# Patient Record
Sex: Female | Born: 1980 | Hispanic: No | Marital: Single | Smoking: Never smoker
Health system: Southern US, Community
[De-identification: ages and names within clinical notes are randomized; demographics above are authoritative.]

## PROBLEM LIST (undated history)

## (undated) DIAGNOSIS — F32A Depression, unspecified: Secondary | ICD-10-CM

## (undated) DIAGNOSIS — R519 Headache, unspecified: Secondary | ICD-10-CM

## (undated) DIAGNOSIS — F419 Anxiety disorder, unspecified: Secondary | ICD-10-CM

## (undated) DIAGNOSIS — Z8049 Family history of malignant neoplasm of other genital organs: Secondary | ICD-10-CM

## (undated) DIAGNOSIS — R51 Headache: Secondary | ICD-10-CM

## (undated) DIAGNOSIS — Z803 Family history of malignant neoplasm of breast: Secondary | ICD-10-CM

## (undated) HISTORY — DX: Family history of malignant neoplasm of breast: Z80.3

## (undated) HISTORY — PX: NO PAST SURGERIES: SHX2092

## (undated) HISTORY — DX: Family history of malignant neoplasm of other genital organs: Z80.49

---

## 2003-03-19 ENCOUNTER — Inpatient Hospital Stay (HOSPITAL_COMMUNITY): Admission: AD | Admit: 2003-03-19 | Discharge: 2003-03-19 | Payer: Self-pay | Admitting: Obstetrics and Gynecology

## 2003-03-23 ENCOUNTER — Inpatient Hospital Stay (HOSPITAL_COMMUNITY): Admission: AD | Admit: 2003-03-23 | Discharge: 2003-03-23 | Payer: Self-pay | Admitting: Obstetrics and Gynecology

## 2003-05-19 ENCOUNTER — Inpatient Hospital Stay (HOSPITAL_COMMUNITY): Admission: AD | Admit: 2003-05-19 | Discharge: 2003-05-21 | Payer: Self-pay | Admitting: Obstetrics and Gynecology

## 2004-10-19 ENCOUNTER — Ambulatory Visit: Payer: Self-pay | Admitting: Nurse Practitioner

## 2004-10-26 ENCOUNTER — Ambulatory Visit: Payer: Self-pay | Admitting: *Deleted

## 2004-11-12 ENCOUNTER — Ambulatory Visit: Payer: Self-pay | Admitting: Nurse Practitioner

## 2004-12-15 ENCOUNTER — Ambulatory Visit: Payer: Self-pay | Admitting: Nurse Practitioner

## 2006-12-06 ENCOUNTER — Encounter (INDEPENDENT_AMBULATORY_CARE_PROVIDER_SITE_OTHER): Payer: Self-pay | Admitting: *Deleted

## 2013-06-03 ENCOUNTER — Encounter (HOSPITAL_COMMUNITY): Payer: Self-pay | Admitting: Emergency Medicine

## 2013-06-03 DIAGNOSIS — Z792 Long term (current) use of antibiotics: Secondary | ICD-10-CM | POA: Insufficient documentation

## 2013-06-03 DIAGNOSIS — Z3202 Encounter for pregnancy test, result negative: Secondary | ICD-10-CM | POA: Insufficient documentation

## 2013-06-03 DIAGNOSIS — Z791 Long term (current) use of non-steroidal anti-inflammatories (NSAID): Secondary | ICD-10-CM | POA: Insufficient documentation

## 2013-06-03 DIAGNOSIS — R1012 Left upper quadrant pain: Secondary | ICD-10-CM | POA: Insufficient documentation

## 2013-06-03 LAB — CBC WITH DIFFERENTIAL/PLATELET
BASOS ABS: 0 10*3/uL (ref 0.0–0.1)
Basophils Relative: 0 % (ref 0–1)
EOS PCT: 1 % (ref 0–5)
Eosinophils Absolute: 0.1 10*3/uL (ref 0.0–0.7)
HEMATOCRIT: 35.8 % — AB (ref 36.0–46.0)
Hemoglobin: 12.3 g/dL (ref 12.0–15.0)
LYMPHS ABS: 2.6 10*3/uL (ref 0.7–4.0)
LYMPHS PCT: 28 % (ref 12–46)
MCH: 27.8 pg (ref 26.0–34.0)
MCHC: 34.4 g/dL (ref 30.0–36.0)
MCV: 81 fL (ref 78.0–100.0)
MONO ABS: 0.7 10*3/uL (ref 0.1–1.0)
Monocytes Relative: 7 % (ref 3–12)
NEUTROS ABS: 6.1 10*3/uL (ref 1.7–7.7)
Neutrophils Relative %: 64 % (ref 43–77)
Platelets: 341 10*3/uL (ref 150–400)
RBC: 4.42 MIL/uL (ref 3.87–5.11)
RDW: 12.9 % (ref 11.5–15.5)
WBC: 9.5 10*3/uL (ref 4.0–10.5)

## 2013-06-03 LAB — URINALYSIS, ROUTINE W REFLEX MICROSCOPIC
Bilirubin Urine: NEGATIVE
GLUCOSE, UA: NEGATIVE mg/dL
Hgb urine dipstick: NEGATIVE
KETONES UR: NEGATIVE mg/dL
Nitrite: NEGATIVE
PROTEIN: NEGATIVE mg/dL
Specific Gravity, Urine: 1.017 (ref 1.005–1.030)
UROBILINOGEN UA: 0.2 mg/dL (ref 0.0–1.0)
pH: 6 (ref 5.0–8.0)

## 2013-06-03 LAB — COMPREHENSIVE METABOLIC PANEL
ALT: 13 U/L (ref 0–35)
AST: 21 U/L (ref 0–37)
Albumin: 4.4 g/dL (ref 3.5–5.2)
Alkaline Phosphatase: 61 U/L (ref 39–117)
BUN: 9 mg/dL (ref 6–23)
CALCIUM: 9.4 mg/dL (ref 8.4–10.5)
CHLORIDE: 99 meq/L (ref 96–112)
CO2: 23 meq/L (ref 19–32)
Creatinine, Ser: 0.56 mg/dL (ref 0.50–1.10)
GLUCOSE: 134 mg/dL — AB (ref 70–99)
Potassium: 4.1 mEq/L (ref 3.7–5.3)
Sodium: 138 mEq/L (ref 137–147)
Total Protein: 7.6 g/dL (ref 6.0–8.3)

## 2013-06-03 LAB — PREGNANCY, URINE: Preg Test, Ur: NEGATIVE

## 2013-06-03 LAB — LIPASE, BLOOD: Lipase: 32 U/L (ref 11–59)

## 2013-06-03 LAB — URINE MICROSCOPIC-ADD ON

## 2013-06-03 NOTE — ED Notes (Signed)
Pt. reports chronic / intermittent LUQ pain for several months described as burning /itching , denies SOB . No nausea or vomitting . No fever or chills.

## 2013-06-04 ENCOUNTER — Emergency Department (HOSPITAL_COMMUNITY): Payer: Self-pay

## 2013-06-04 ENCOUNTER — Emergency Department (HOSPITAL_COMMUNITY)
Admission: EM | Admit: 2013-06-04 | Discharge: 2013-06-04 | Disposition: A | Discharge status: Home / Self Care | Payer: Self-pay | Attending: Emergency Medicine | Admitting: Emergency Medicine

## 2013-06-04 ENCOUNTER — Encounter (HOSPITAL_COMMUNITY): Payer: Self-pay | Admitting: Radiology

## 2013-06-04 DIAGNOSIS — R109 Unspecified abdominal pain: Secondary | ICD-10-CM

## 2013-06-04 MED ORDER — IOHEXOL 300 MG/ML  SOLN
20.0000 mL | INTRAMUSCULAR | Status: DC
  Administered 2013-06-04: 20 mL via ORAL

## 2013-06-04 MED ORDER — SODIUM CHLORIDE 0.9 % IV BOLUS (SEPSIS)
1000.0000 mL | Freq: Once | INTRAVENOUS | Status: AC
  Administered 2013-06-04: 1000 mL via INTRAVENOUS

## 2013-06-04 MED ORDER — IOHEXOL 300 MG/ML  SOLN
100.0000 mL | Freq: Once | INTRAMUSCULAR | Status: AC | PRN
  Administered 2013-06-04: 100 mL via INTRAVENOUS

## 2013-06-04 NOTE — ED Notes (Signed)
[id number] is ID # for spanish interpeter.

## 2013-06-04 NOTE — ED Notes (Signed)
Patient transported to CT 

## 2013-06-04 NOTE — ED Notes (Signed)
Patient reports that she has been dealing with this abdominal discomfort for over a year, and medications have not been effective. She reports it does not hurt, but it "feels like something warm is in there".

## 2013-06-04 NOTE — ED Notes (Signed)
Introduced self to patient, she does not speak [place].  Niece is at the bedside.  Will obtain interpreter for assessment.

## 2013-06-04 NOTE — ED Notes (Signed)
Contacted CT, patient has finished drinking contrast and has IV access. Explained to patient the plan of care.

## 2013-06-04 NOTE — ED Provider Notes (Signed)
CSN: [id number]     Arrival date & time 06/03/13  1913 History   First MD Initiated Contact with Patient 06/04/13 0235     Chief Complaint  Patient presents with  . Abdominal Pain     (Consider location/radiation/quality/duration/timing/severity/associated sxs/prior Treatment) HPI Comments: Patient is a 33 year old female who presents with complaints of pain and burning in the left upper quadrant for the past year. She has seen several doctors for this however does not feel as though she's been given an adequate explanation. She denies fevers or chills. She denies any bowel or bladder complaints.  Patient does not speak [place], only [place]. History was taken with the assistance of her friend who was present at bedside.  Patient is a 33 y.o. female presenting with abdominal pain. The history is provided by the patient.  Abdominal Pain Pain location:  LUQ Pain quality: burning   Pain radiates to:  Does not radiate Pain severity:  Moderate Onset quality:  Gradual Duration: One year. Timing:  Constant Progression:  Worsening Chronicity:  New Relieved by:  Nothing Worsened by:  Nothing tried   History reviewed. No pertinent past medical history. History reviewed. No pertinent past surgical history. No family history on file. History  Substance Use Topics  . Smoking status: Never Smoker   . Smokeless tobacco: Not on file  . Alcohol Use: No   OB History   Grav Para Term Preterm Abortions TAB SAB Ect Mult Living                 Review of Systems  Gastrointestinal: Positive for abdominal pain.  All other systems reviewed and are negative.      Allergies  Review of patient's allergies indicates no known allergies.  Home Medications   Current Outpatient Rx  Name  Route  Sig  Dispense  Refill  . ciprofloxacin (CIPRO) 500 MG tablet   Oral   Take 500 mg by mouth 2 (two) times daily. For 14 days.  From old rx         . indomethacin (INDOCIN) 25 MG capsule   Oral  Take 25 mg by mouth 3 (three) times daily with meals.         . metroNIDAZOLE (FLAGYL) 500 MG tablet   Oral   Take 500 mg by mouth 2 (two) times daily. For 14 days, started on 05-24-13 this is from an old rx          BP 130/82  Pulse 90  Temp(Src) 99.1 F (37.3 C) (Oral)  Resp 18  SpO2 100%  LMP 05/10/2013 Physical Exam  Nursing note and vitals reviewed. Constitutional: She is oriented to person, place, and time. She appears well-developed and well-nourished. No distress.  HENT:  Head: Normocephalic and atraumatic.  Neck: Normal range of motion. Neck supple.  Cardiovascular: Normal rate and regular rhythm.  Exam reveals no gallop and no friction rub.   No murmur heard. Pulmonary/Chest: Effort normal and breath sounds normal. No respiratory distress. She has no wheezes.  Abdominal: Soft. Bowel sounds are normal. She exhibits no distension and no mass. There is tenderness. There is no rebound and no guarding.  There is mild tenderness to palpation in the left upper quadrant with no rebound or guarding. Bowel sounds are present.  Musculoskeletal: Normal range of motion.  Neurological: She is alert and oriented to person, place, and time.  Skin: Skin is warm and dry. She is not diaphoretic.    ED Course  Procedures (including critical  care time) Labs Review Labs Reviewed  CBC WITH DIFFERENTIAL - Abnormal; Notable for the following:    HCT 35.8 (*)    All other components within normal limits  COMPREHENSIVE METABOLIC PANEL - Abnormal; Notable for the following:    Glucose, Bld 134 (*)    Total Bilirubin <0.2 (*)    All other components within normal limits  URINALYSIS, ROUTINE W REFLEX MICROSCOPIC - Abnormal; Notable for the following:    Leukocytes, UA SMALL (*)    All other components within normal limits  URINE MICROSCOPIC-ADD ON - Abnormal; Notable for the following:    Squamous Epithelial / LPF FEW (*)    Bacteria, UA FEW (*)    All other components within normal  limits  LIPASE, BLOOD  PREGNANCY, URINE   Imaging Review Ct Abdomen Pelvis W Contrast  06/04/2013   CLINICAL DATA:  Abdominal pain.  EXAM: CT ABDOMEN AND PELVIS WITH CONTRAST  TECHNIQUE: Multidetector CT imaging of the abdomen and pelvis was performed using the standard protocol following bolus administration of intravenous contrast.  CONTRAST:  100 mL OMNIPAQUE IOHEXOL 300 MG/ML  SOLN  COMPARISON:  None.  FINDINGS: The lung bases are clear.  No pleural or pericardial effusion.  The gallbladder, liver, spleen, adrenal glands, pancreas and kidneys are all unremarkable. The uterus, adnexa and urinary bladder appear normal. The stomach, small and large bowel and appendix appear normal. Trace amount of free pelvic fluid is compatible with physiologic change. There is no lymphadenopathy. No focal bony abnormality is identified.  IMPRESSION: Negative exam.   Electronically Signed   By: Drusilla Kanner M.D.   On: 06/04/2013 03:46     EKG Interpretation None      MDM   Final diagnoses:  None    Patient presents with left upper quadrant pain. Physical examination is essentially unremarkable. The symptoms have been going for a year and she has seen other doctors for this. She states they give her medicine which was for short period of time then the pain returns. Workup today reveals no elevation of white count, normal urinalysis, negative pregnancy test, and CT scan that reveals no acute pathology. At this point I feel as though she is appropriate for discharge. I will try a course of Prilosec and have her followup with her primary physician if not improving.    Geoffery Lyons, MD 06/04/13 [phone]

## 2013-06-04 NOTE — ED Notes (Signed)
Patient returned from CT.  Explained that the physician will interpret the findings and decide if she needs to stay overnight or not.  At this time, the results are not available yet, but staff will keep her updated on the plan of care.

## 2013-06-04 NOTE — Discharge Instructions (Signed)
Prilosec 20 mg twice daily for the next 2 weeks.  Followup with your Dr. if not improving in this time, and return to the ER if your symptoms substantially worsen or change.   Dolor abdominal en adultos (Abdominal Pain, Adult) El dolor puede tener muchas causas. Normalmente la causa del dolor abdominal no es una enfermedad y Scientist, clinical (histocompatibility and immunogenetics)mejorar sin TEFL teachertratamiento. Frecuentemente puede controlarse y tratarse en casa. Su mdico le Medical sales representativerealizar un examen fsico y posiblemente solicite anlisis de sangre y radiografas para ayudar a Chief Strategy Officerdeterminar la gravedad de su dolor. Sin embargo, en IAC/InterActiveCorpmuchos casos, debe transcurrir ms tiempo antes de que se pueda Clinical research associateencontrar una causa evidente del dolor. Antes de llegar a ese punto, es posible que su mdico no sepa si necesita ms pruebas o un tratamiento ms profundo. INSTRUCCIONES PARA EL CUIDADO EN EL HOGAR  Est atento al dolor para ver si hay cambios. Las siguientes indicaciones ayudarn a Architectural technologistaliviar cualquier molestia que pueda sentir:  Arbovaleome solo medicamentos de venta libre o recetados, segn las indicaciones del mdico.  No tome laxantes a menos que se lo haya indicado su mdico.  Pruebe con Neomia Dearuna dieta lquida absoluta (caldo, t o agua) segn se lo indique su mdico. Introduzca gradualmente una dieta normal, segn su tolerancia. SOLICITE ATENCIN MDICA SI:  Tiene dolor abdominal sin explicacin.  Tiene dolor abdominal relacionado con nuseas o diarrea.  Tiene dolor cuando orina o defeca.  Experimenta dolor abdominal que lo despierta de noche.  Tiene dolor abdominal que empeora o mejora cuando come alimentos.  Tiene dolor abdominal que empeora cuando come alimentos grasosos. SOLICITE ATENCIN MDICA DE INMEDIATO SI:   El dolor no desaparece en un plazo mximo de 2horas.  Tiene fiebre.  No deja de (vomitar).  El Engineer, miningdolor se siente solo en partes del abdomen, como el lado derecho o la parte inferior izquierda del abdomen.  Evaca materia fecal sanguinolenta o negra,  de aspecto alquitranado. ASEGRESE DE QUE:  Comprende estas instrucciones.  Controlar su afeccin.  Recibir ayuda de inmediato si no mejora o si empeora. Document Released: 03/07/2005 Document Revised: 12/26/2012 [facility] Patient Information 2014 [place]Care, [place].

## 2013-06-12 ENCOUNTER — Ambulatory Visit: Payer: No Typology Code available for payment source | Attending: Internal Medicine

## 2013-09-09 ENCOUNTER — Other Ambulatory Visit (HOSPITAL_COMMUNITY)
Admission: RE | Admit: 2013-09-09 | Discharge: 2013-09-09 | Disposition: A | Discharge status: Home / Self Care | Payer: No Typology Code available for payment source | Source: Ambulatory Visit | Attending: Internal Medicine | Admitting: Internal Medicine

## 2013-09-09 ENCOUNTER — Ambulatory Visit: Payer: No Typology Code available for payment source | Attending: Internal Medicine | Admitting: Internal Medicine

## 2013-09-09 ENCOUNTER — Encounter: Payer: Self-pay | Admitting: Internal Medicine

## 2013-09-09 VITALS — BP 113/76 | HR 84 | Temp 98.8°F | Resp 16 | Ht 64.0 in | Wt 154.0 lb

## 2013-09-09 DIAGNOSIS — Z113 Encounter for screening for infections with a predominantly sexual mode of transmission: Secondary | ICD-10-CM | POA: Insufficient documentation

## 2013-09-09 DIAGNOSIS — B3749 Other urogenital candidiasis: Secondary | ICD-10-CM

## 2013-09-09 DIAGNOSIS — Z01419 Encounter for gynecological examination (general) (routine) without abnormal findings: Secondary | ICD-10-CM | POA: Insufficient documentation

## 2013-09-09 DIAGNOSIS — Z0000 Encounter for general adult medical examination without abnormal findings: Secondary | ICD-10-CM

## 2013-09-09 DIAGNOSIS — Z1151 Encounter for screening for human papillomavirus (HPV): Secondary | ICD-10-CM | POA: Insufficient documentation

## 2013-09-09 DIAGNOSIS — N898 Other specified noninflammatory disorders of vagina: Secondary | ICD-10-CM | POA: Insufficient documentation

## 2013-09-09 DIAGNOSIS — N760 Acute vaginitis: Secondary | ICD-10-CM | POA: Insufficient documentation

## 2013-09-09 LAB — POCT URINALYSIS DIPSTICK
BILIRUBIN UA: NEGATIVE
Blood, UA: NEGATIVE
GLUCOSE UA: NEGATIVE
KETONES UA: NEGATIVE
LEUKOCYTES UA: NEGATIVE
Nitrite, UA: NEGATIVE
Protein, UA: NEGATIVE
Spec Grav, UA: 1.02
Urobilinogen, UA: 0.2
pH, UA: 7

## 2013-09-09 LAB — CBC WITH DIFFERENTIAL/PLATELET
BASOS ABS: 0 10*3/uL (ref 0.0–0.1)
Basophils Relative: 0 % (ref 0–1)
EOS PCT: 1 % (ref 0–5)
Eosinophils Absolute: 0.1 10*3/uL (ref 0.0–0.7)
HCT: 35.5 % — ABNORMAL LOW (ref 36.0–46.0)
Hemoglobin: 12.1 g/dL (ref 12.0–15.0)
LYMPHS PCT: 32 % (ref 12–46)
Lymphs Abs: 1.9 10*3/uL (ref 0.7–4.0)
MCH: 27.2 pg (ref 26.0–34.0)
MCHC: 34.1 g/dL (ref 30.0–36.0)
MCV: 79.8 fL (ref 78.0–100.0)
Monocytes Absolute: 0.5 10*3/uL (ref 0.1–1.0)
Monocytes Relative: 8 % (ref 3–12)
NEUTROS ABS: 3.5 10*3/uL (ref 1.7–7.7)
NEUTROS PCT: 59 % (ref 43–77)
PLATELETS: 277 10*3/uL (ref 150–400)
RBC: 4.45 MIL/uL (ref 3.87–5.11)
RDW: 13.7 % (ref 11.5–15.5)
WBC: 6 10*3/uL (ref 4.0–10.5)

## 2013-09-09 LAB — TSH: TSH: 1.135 u[IU]/mL (ref 0.350–4.500)

## 2013-09-09 LAB — POCT GLYCOSYLATED HEMOGLOBIN (HGB A1C): HEMOGLOBIN A1C: 5.5

## 2013-09-09 NOTE — Progress Notes (Signed)
Pt here to establish care for physical with pap smear/std screening today per Spanish interpretor Pt c/o vaginal itch with burning x 1 week and scant dark bloody d/c Denies abdominal pain or discomfort No medical problems noted.vss Spanish interpretor present Urine sample obtained

## 2013-09-09 NOTE — Progress Notes (Signed)
CC: Bleeding  HPI: 33 year old female with no significant past medical history who presented to clinic with complaints of passing blood per vagina about 7 days prior to this presentation. Her last menstrual period was 08/20/2013 it lasted about 7 days. She was not bleeding but then had an episode of bleed about 7 days ago. She does not have bleeding at this time. She also experienced some lower abdominal discomfort which now has resolved. No fevers or chills. No abnormal discharge at this time. No nausea or vomiting.  No Known Allergies History reviewed. No pertinent past medical history. Current Outpatient Prescriptions on File Prior to Visit  Medication Sig Dispense Refill  . ciprofloxacin (CIPRO) 500 MG tablet Take 500 mg by mouth 2 (two) times daily. For 14 days.  From old rx      . indomethacin (INDOCIN) 25 MG capsule Take 25 mg by mouth 3 (three) times daily with meals.      . metroNIDAZOLE (FLAGYL) 500 MG tablet Take 500 mg by mouth 2 (two) times daily. For 14 days, started on 05-24-13 this is from an old rx       No current facility-administered medications on file prior to visit.   No family history on file. History   Social History  . Marital Status: Single    Spouse Name: N/A    Number of Children: N/A  . Years of Education: N/A   Occupational History  . Not on file.   Social History Main Topics  . Smoking status: Never Smoker   . Smokeless tobacco: Not on file  . Alcohol Use: No  . Drug Use: No  . Sexual Activity: Not on file   Other Topics Concern  . Not on file   Social History Narrative  . No narrative on file    Review of Systems  Constitutional: Negative for fever, chills, diaphoresis, activity change, appetite change and fatigue.  HENT: Negative for ear pain, nosebleeds, congestion, facial swelling, rhinorrhea, neck pain, neck stiffness and ear discharge.   Eyes: Negative for pain, discharge, redness, itching and visual disturbance.  Respiratory:  Negative for cough, choking, chest tightness, shortness of breath, wheezing and stridor.   Cardiovascular: Negative for chest pain, palpitations and leg swelling.  Gastrointestinal: Negative for abdominal distention.  Genitourinary: Negative for dysuria, urgency, frequency, hematuria, flank pain, decreased urine volume, difficulty urinating and dyspareunia.  Musculoskeletal: Negative for back pain, joint swelling, arthralgias and gait problem.  Neurological: Negative for dizziness, tremors, seizures, syncope, facial asymmetry, speech difficulty, weakness, light-headedness, numbness and headaches.  Hematological: Negative for adenopathy. Does not bruise/bleed easily.  Psychiatric/Behavioral: Negative for hallucinations, behavioral problems, confusion, dysphoric mood, decreased concentration and agitation.    Objective:   Filed Vitals:   09/09/13 1014  BP: 113/76  Pulse: 84  Temp: 98.8 F (37.1 C)  Resp: 16    Physical Exam  Constitutional: Appears well-developed and well-nourished. No distress.  HENT: Normocephalic. External right and left ear normal. Oropharynx is clear and moist.  Eyes: Conjunctivae and EOM are normal. PERRLA, no scleral icterus.  Neck: Normal ROM. Neck supple. No JVD. No tracheal deviation. No thyromegaly.  CVS: RRR, S1/S2 +, no murmurs, no gallops, no carotid bruit.  Pulmonary: Effort and breath sounds normal, no stridor, rhonchi, wheezes, rales.  Abdominal: Soft. BS +,  no distension, tenderness, rebound or guarding.  Musculoskeletal: Normal range of motion. No edema and no tenderness.  Lymphadenopathy: No lymphadenopathy noted, cervical, inguinal. Neuro: Alert. Normal reflexes, muscle tone coordination. No cranial nerve  deficit. Skin: Skin is warm and dry. No rash noted. Not diaphoretic. No erythema. No pallor.  Psychiatric: Normal mood and affect. Behavior, judgment, thought content normal.   Lab Results  Component Value Date   WBC 9.5 06/03/2013   HGB 12.3  06/03/2013   HCT 35.8* 06/03/2013   MCV 81.0 06/03/2013   PLT 341 06/03/2013   Lab Results  Component Value Date   CREATININE 0.56 06/03/2013   BUN 9 06/03/2013   NA 138 06/03/2013   K 4.1 06/03/2013   CL 99 06/03/2013   CO2 23 06/03/2013    No results found for this basename: HGBA1C   Lipid Panel  No results found for this basename: chol, trig, hdl, cholhdl, vldl, ldlcalc       Assessment and plan:   Patient Active Problem List   Diagnosis Date Noted  . Preventative health care 09/09/2013    Priority: Medium - Order placed for A1c, TSH, CBC  - GYn exam to be done by Dr. Hyman Hopesjegede - order placed for pelvic US for further evaluation of lower abdominal discomfort which of note is not present at this time but patient did report having it about one week ago with passing some blood  - Last menstrual period 08/20/2013. Lasted about 7 days. Patient then experienced 1 day episode of passing blood about 7 days ago.  - Check urinalysis

## 2013-09-09 NOTE — Patient Instructions (Addendum)
Mantenimiento de la salud en las mujeres (Health Maintenance, Females) Un estilo de vida saludable y los cuidados preventivos pueden favorecer la salud y el bienestar.   Haga exmenes regulares de la salud en general, dentales y de los ojos.  Consuma una dieta saludable. Los alimentos como vegetales, frutas, granos enteros, productos lcteos descremados y protenas magras contienen los nutrientes que usted necesita sin necesidad de consumir muchas caloras. Disminuya el consumo de alimentos con alto contenido de grasas slidas, azcar y sal agregadas. Si es necesario, pdale informacin acerca de una dieta adecuada a su mdico.  La actividad fsica regular es una de las cosas ms importantes que puede hacer por su salud. Los adultos deben hacer al menos 150 minutos de ejercicios de intensidad moderada (cualquier actividad que aumente la frecuencia cardaca y lo haga transpirar) cada semana. Adems, la mayora de los adultos necesita ejercicios de fortalecimiento muscular 2  ms das por semana.   Mantenga un peso saludable. El ndice de masa corporal (IMC) es una herramienta que identifica posibles problemas con el peso. Proporciona una estimacin de la grasa corporal basndose en el peso y la altura. El mdico podr determinar su IMC y podr ayudarlo a lograr o mantener un peso saludable. Para los adultos de 20 aos o ms:  Un IMC menor a 18,5 se considera bajo peso.  Un IMC entre 18,5 y 24,9 es normal.  Un IMC entre 25 y 29,9 es sobrepeso.  Un IMC entre 30 o ms es obesidad.  Mantenga un nivel normal de lpidos y colesterol en sangre practicando actividad fsica y minimizando la ingesta de grasas saturadas. Consuma una dieta balanceada e incluya variedad de frutas y vegetales. Los anlisis de lpidos y colesterol en sangre deben comenzar a los 20 aos y repetirse cada 5 aos. Si los niveles de colesterol son altos, tiene ms de 50 aos o tiene riesgo elevado de sufrir enfermedades cardacas,  necesitar controlarse con ms frecuencia.Si tiene niveles elevados de lpidos y colesterol, debe recibir tratamiento con medicamentos, si la dieta y el ejercicio no son efectivos.  Si fuma, consulte con el profesional acerca de las opciones para dejar de hacerlo. Si no lo hace, no comience.  Se recomienda realizar controles para el cncer de pulmn a los adultos mayores de 55 a 80 aos que tengan alto riesgo de desarrollar la enfermedad debido a sus antecedentes de tabaquismo. Se recomienda realizar una tomografa computarizada (TC) anual de dosis bajas en aquellas personas tienen un promedio de 30 paquetes al ao de historia de tabaquismo y en la actualidad fuman o han dejado de fumar dentro de los ltimos 15 aos. Un paquete al ao de fumador es fumar un promedio de 1 paquete de cigarrillos al da durante 1 ao (por ejemplo: 1 paquete al da durante 30 aos o dos paquetes al da durante 15 aos). El control anual debe continuar hasta que el fumador haya dejado de fumar durante al menos 15 aos. Screening anual debe interrumpirse en aquellas personas que desarrollan un problema de salud que les impida seguir un tratamiento para el cncer de pulmn.  Si est embarazada no beba alcohol. Si est amamantando, beba alcohol con prudencia. Si elige beber alcohol, no se exceda de 1 medida por da. Se considera una medida a 12 onzas (355 ml) de cerveza, 5 onzas (148 ml) de vino, o 1,5 onzas (44 ml) de licor.  No comparta agujas con nadie. Pida ayuda si necesita asistencia o instrucciones con respecto a abandonar el consumo de   alcohol, cigarrillos o drogas.  La hipertensin arterial causa enfermedades cardacas y aumenta el riesgo de ictus. Debe controlar su presin arterial al menos cada 1 o 2 aos. La presin arterial elevada que persiste debe tratarse con medicamentos si la prdida de peso y el ejercicio no son efectivos.  Si tiene entre 55 y 79 aos, consulte a su mdico si debe tomar aspirina para prevenir  enfermedades cardacas.  Los anlisis para la diabetes incluyen la toma de una muestra de sangre para controlar el nivel de azcar en la sangre durante el ayuno. Debe hacerlo cada 3 aos despus de los 45 aos si est dentro de su peso normal y sin factores de riesgo para la diabetes. Las pruebas deben comenzar a edades tempranas o llevarse a cabo con ms frecuencia si tiene sobrepeso y al menos 1 factor de riesgo para la diabetes.  Las evaluaciones para detectar el cncer de mama son un mtodo preventivo fundamental para las mujeres. Debe practicar la "autoconciencia de las mamas". Esto significa que debe reconocer la apariencia normal de sus mamas y como las siente y pudiendo incluir un autoexamen de mamas. Si detecta algn cambio, no importa cun pequeo sea, debe informarlo a su mdico. Las mujeres entre 20 y 40 aos deben hacer un examen clnico de las mamas como parte del examen regular de salud, cada 1 a 3 aos. Despus de los 40 aos deben hacerlo todos los aos. Deben hacerse una mamografa radografa de mamas ) cada ao, comenzando a los 40 aos. Las mujeres con historia familiar de cncer de mama deben hablar con el mdico para hacer un estudio gentico. Las que tienen ms riesgo deben hacerse resonancia magntica y una mamografa todos los aos.  La evaluacin del riesgo de sufrir cncer relacionado con el cncer de mama gentico (BRCA) se recomienda en aquellas mujeres que tienen familiares con cncer relacionados con el BRCA El cncer relacionado con BRCA incluye el cncer de mama, de ovario, de trompas y peritoneal. El hecho de tener familiares con estos tipos de cncer puede asociarse con un mayor riesgo de a sufrir cambios perjudiciales (mutaciones) en los genes para el cncer de mama BRCA1 y BRCA2. Los resultados de la evaluacin determinar la necesidad de asesoramiento gentico BRCA1 y BRCA2 y pruebas. Los resultados de la evaluacin determinarn la necesidad de asesoramiento gentico y  estudios para el BRCA1 y BRCA2.  Un Papanicolau se realiza para diagnosticar cncer de cuello de tero. Las mujeres deben hacerse un Papanicolau a partir de los 21 aos. Entre los 21 y los 29 aos debe repetirse cada dos aos. Luego de los 30 aos, debe realizarse un Papanicolau cada tres aos siempre que los 3 estudios anteriores sean normales. Si le han realizado una histerectoma por un problema que no era cncer u otra enfermedad que podra causar cncer, ya no necesitar un Papanicolau. Si tiene entre 65 y 70 aos y ha tenido un Papanicolau normal en los ltimos 10 aos, ya no ser necesario realizarlo. Si ha recibido un tratamiento para el cncer cervical o para una enfermedad que podra causar cncer, necesitar realizar un Papanicolau y controles durante al menos 20 aos de concluir el tratamiento. Si no se ha hecho el examen con regularidad, debern volver a evaluarse los factores de riesgo (como el tener un nuevo compaero sexual) para determinar si debe volver a realizarse los estudios. Algunas mujeres sufren problemas mdicos que aumentan la probabilidad de contraer cncer cervical. En estos casos, el mdico podr indicar que se   realice el Papanicolau con ms frecuencia.  La prueba del virus del papiloma humano (VPH) es un anlisis adicional que puede usarse para detectar cncer de cuello de tero. Esta prueba busca la presencia del virus que causa los cambios en el cuello. Las clulas que se recolectan durante el Papanicolau pueden usarse para el VPH. La prueba para el VPH puede usarse para evaluar a mujeres de ms de 30 aos y debe usarse en mujeres de cualquier edad cuyos resultados del Papanicolau no sean claros. Despus de los 30 aos, las mujeres deben hacerse el anlisis para el VPH con la misma frecuencia que el Papanicolau.  El cncer colorectal puede detectarse y con frecuencia puede prevenirse. La mayor parte de los estudios de rutina comienzan a los 50 aos y continan hasta los 75  aos. Sin embargo, el mdico podr aconsejarle que lo haga antes, si tiene factores de riesgo para el cncer de colon. Una vez por ao, el profesional le dar un kit de prueba para hallar sangre oculta en la materia fecal. La utilizacin de un tubo con una pequea cmara en su extremo para examinar directamente el colon (sigmoidoscopa o colonoscopa), puede detectar formas temprana de cncer colorectal. Hable con su mdico si tiene 50 aos, cuando comience con los estudios de rutina. El examen directo del colon debe repetirse cada 5 a 10 aos, hasta los 75 aos, excepto que se encuentren formas tempranas de plipos precancerosos o pequeos bultos.  Se recomienda realizar un anlisis de sangre para detectar hepatitis C a todas las personas nacidas entre 1945 y 1965, y a todo aquel que tenga un riesgo conocido de haber contrado esta enfermedad.  Practique el sexo seguro. Use condones y evite las prcticas sexuales riesgosas para disminuir el contagio de enfermedades de transmisin sexual. Las mujeres sexualmente activas de 25 aos o menos deben controlarse para descartar clamidia, que es una infeccin de transmisin sexual frecuente. Las mujeres mayores que tengan mltiples compaeros tambin deben hacerse el anlisis para detectar clamidia. Se recomienda realizar anlisis para detectar otras enfermedades de transmisin sexual si es sexualmente activa y tiene riesgos.  La osteoporosis es una enfermedad en la que los huesos pierden los minerales y la fuerza por el avance de la edad. El resultado pueden ser fracturas graves en los huesos. El riesgo de osteoporosis puede identificarse con una prueba de densidad sea. Las mujeres de ms de 65 aos y las que tengan riesgos de sufrir fracturas u osteoporosis deben pedir consejo a su mdico. Consulte a su mdico si debe tomar un suplemento de calcio o de vitamina D para reducir el riesgo de osteoporosis.  La menopausia se asocia a sntomas y riesgos fsicos. Se  dispone de una terapia de reemplazo hormonal para disminuir los sntomas y los riesgos. Consulte a su mdico para saber si la terapia de reemplazo hormonal es conveniente para usted.  Use una pantalla solar. Aplique pantalla de manera libre y repetida a lo largo del da. Pngase al resguardo del sol cuando la sombra sea ms pequea que usted. Protjase usando mangas y pantalones largos, un sombrero de ala ancha y gafas para el sol todo el ao, siempre que se encuentre en el exterior.  Informe a su mdico si aparecen nuevos lunares o los que tiene se modifican, especialmente en forma y color. Tambin notifique al mdico si un lunar es ms grande que el tamao de una goma de lpiz.  Mantngase al da con las vacunas. Document Released: 02/24/2011 Document Revised: 07/02/2012 ExitCare Patient   with your health care Siona Coulston.  

## 2013-09-10 LAB — CYTOLOGY - PAP

## 2013-09-10 LAB — HIV ANTIBODY (ROUTINE TESTING W REFLEX): HIV 1&2 Ab, 4th Generation: NONREACTIVE

## 2013-09-11 ENCOUNTER — Ambulatory Visit (HOSPITAL_COMMUNITY)
Admission: RE | Admit: 2013-09-11 | Discharge: 2013-09-11 | Disposition: A | Discharge status: Home / Self Care | Payer: No Typology Code available for payment source | Source: Ambulatory Visit | Attending: Internal Medicine | Admitting: Internal Medicine

## 2013-09-11 ENCOUNTER — Ambulatory Visit (HOSPITAL_COMMUNITY): Payer: No Typology Code available for payment source

## 2013-09-11 DIAGNOSIS — B3749 Other urogenital candidiasis: Secondary | ICD-10-CM

## 2013-09-11 DIAGNOSIS — N925 Other specified irregular menstruation: Secondary | ICD-10-CM | POA: Insufficient documentation

## 2013-09-11 DIAGNOSIS — N938 Other specified abnormal uterine and vaginal bleeding: Secondary | ICD-10-CM | POA: Insufficient documentation

## 2013-09-11 DIAGNOSIS — N949 Unspecified condition associated with female genital organs and menstrual cycle: Secondary | ICD-10-CM | POA: Insufficient documentation

## 2013-09-11 DIAGNOSIS — Z0000 Encounter for general adult medical examination without abnormal findings: Secondary | ICD-10-CM

## 2013-09-12 ENCOUNTER — Telehealth: Payer: Self-pay | Admitting: *Deleted

## 2013-09-12 NOTE — Telephone Encounter (Signed)
Message copied by Raynelle CharyWINFREE, DUSTIN R on Thu Sep 12, 2013  3:11 PM ------      Message from: Jeanann LewandowskyJEGEDE, OLUGBEMIGA E      Created: Thu Sep 12, 2013  2:39 PM       Please inform patient that her Pap smear is negative for malignancy, also negative for any infection. Her HIV is negative as of 09/10/2013 ------

## 2013-09-12 NOTE — Telephone Encounter (Signed)
Spoke to the pt and informed her that her lab results and pap smear was normal.

## 2013-09-26 ENCOUNTER — Telehealth: Payer: Self-pay | Admitting: *Deleted

## 2013-09-26 NOTE — Telephone Encounter (Signed)
Left message to return my call.  

## 2013-09-26 NOTE — Telephone Encounter (Signed)
Message copied by Raynelle CharyWINFREE, Kayleann Mccaffery R on Thu Sep 26, 2013 10:19 AM ------      Message from: Quentin AngstJEGEDE, OLUGBEMIGA E      Created: Tue Sep 24, 2013  5:57 PM       Please inform patient that her pelvic ultrasound is normal ------

## 2013-10-28 ENCOUNTER — Ambulatory Visit: Payer: No Typology Code available for payment source | Attending: Internal Medicine | Admitting: Internal Medicine

## 2013-10-28 ENCOUNTER — Encounter: Payer: Self-pay | Admitting: Internal Medicine

## 2013-10-28 VITALS — BP 110/75 | HR 83 | Temp 98.5°F | Resp 16 | Ht 64.0 in | Wt 153.0 lb

## 2013-10-28 DIAGNOSIS — M25562 Pain in left knee: Secondary | ICD-10-CM

## 2013-10-28 DIAGNOSIS — N760 Acute vaginitis: Secondary | ICD-10-CM | POA: Insufficient documentation

## 2013-10-28 DIAGNOSIS — G8929 Other chronic pain: Secondary | ICD-10-CM | POA: Insufficient documentation

## 2013-10-28 DIAGNOSIS — M25561 Pain in right knee: Secondary | ICD-10-CM

## 2013-10-28 DIAGNOSIS — M25569 Pain in unspecified knee: Secondary | ICD-10-CM

## 2013-10-28 LAB — SEDIMENTATION RATE: Sed Rate: 10 mm/hr (ref 0–22)

## 2013-10-28 MED ORDER — MULTIVITAMINS PO CAPS: 1.0000 | ORAL_CAPSULE | Freq: Every day | ORAL | Status: DC

## 2013-10-28 MED ORDER — INDOMETHACIN 25 MG PO CAPS: 25.0000 mg | ORAL_CAPSULE | Freq: Three times a day (TID) | ORAL | Status: DC

## 2013-10-28 MED ORDER — MICONAZOLE NITRATE 2 % VA CREA: 1.0000 | TOPICAL_CREAM | Freq: Every day | VAGINAL | Status: DC

## 2013-10-28 NOTE — Progress Notes (Signed)
Patient ID: Tracie [name], female   DOB: 09/22/1980, 33 y.o.   MRN: [id number]   Tracie [name], is a 33 y.o. female  [id number]  [id number]  DOB - 09/19/1980  Chief Complaint  Patient presents with  . Follow-up        Subjective:   Tracie [name] is a 33 y.o. female here today for a follow up visit. Patient has no significant past medical history, here today for an appointment for knee pain. Sometimes in June of this year, she noticed bilateral knee swelling and pain both resolved without medication, she did not seek medical help. Since then she has had on and off pain in her knees and wrists joints. She has no rash, no family history of rheumatoid arthritis or any autoimmune disorder. She has a 49 year old child, though she desires to have more but she's not looking at the moment. She denies anxiety or depression. She was seen here in June for a vaginal itching, Pap smear was done which was negative for candidiasis and any infection whatsoever, but patient claims she continues to have itching in and around the vagina. Patient does not smoke cigarette, she does not drink alcohol. She's not on any medication on chronic basis Patient has No headache, No chest pain, No abdominal pain - No Nausea, No new weakness tingling or numbness, No Cough - SOB.  Problem  Bilateral Chronic Knee Pain  Vaginitis and Vulvovaginitis    ALLERGIES: No Known Allergies  PAST MEDICAL HISTORY: History reviewed. No pertinent past medical history.  MEDICATIONS AT HOME: Prior to Admission medications   Medication Sig Start Date End Date Taking? Authorizing Provider  ciprofloxacin (CIPRO) 500 MG tablet Take 500 mg by mouth 2 (two) times daily. For 14 days.  From old rx    Historical Provider, MD  indomethacin (INDOCIN) 25 MG capsule Take 1 capsule (25 mg total) by mouth 3 (three) times daily with meals. 10/28/13   Jeanann Lewandowsky, MD  metroNIDAZOLE (FLAGYL) 500 MG tablet Take 500 mg  by mouth 2 (two) times daily. For 14 days, started on 05-24-13 this is from an old rx    Historical Provider, MD  miconazole ([facility] MICONAZOLE NITRATE) 2 % vaginal cream Place 1 Applicatorful vaginally at bedtime. 10/28/13   Jeanann Lewandowsky, MD  Multiple Vitamin (MULTIVITAMIN) capsule Take 1 capsule by mouth daily. 10/28/13   Jeanann Lewandowsky, MD     Objective:   Filed Vitals:   10/28/13 1000  BP: 110/75  Pulse: 83  Temp: 98.5 F (36.9 C)  TempSrc: Oral  Resp: 16  Height: 5\' 4"  (1.626 m)  Weight: 153 lb (69.4 kg)  SpO2: 99%    Exam General appearance : Awake, alert, not in any distress. Speech Clear. Not toxic looking, mildly obese HEENT: Atraumatic and Normocephalic, pupils equally reactive to light and accomodation Neck: supple, no JVD. No cervical lymphadenopathy.  Chest:Good air entry bilaterally, no added sounds  CVS: S1 S2 regular, no murmurs.  Abdomen: Bowel sounds present, Non tender and not distended with no gaurding, rigidity or rebound. Extremities: B/L Lower Ext shows no edema, both legs are warm to touch, no knee effusion or tenderness Neurology: Awake alert, and oriented X 3, CN II-XII intact, Non focal Pelvic examination: Not done.  Data Review Lab Results  Component Value Date   HGBA1C 5.5 09/09/2013     Assessment & Plan   1. Bilateral chronic knee pain  - indomethacin (INDOCIN) 25 MG capsule; Take 1 capsule (25 mg total) by  mouth 3 (three) times daily with meals.  Dispense: 30 capsule; Refill: 2 - Multiple Vitamin (MULTIVITAMIN) capsule; Take 1 capsule by mouth daily.  Dispense: 60 capsule; Refill: 2  - ANA - Sedimentation rate  2. Vaginitis and vulvovaginitis  - miconazole ([facility] MICONAZOLE NITRATE) 2 % vaginal cream; Place 1 Applicatorful vaginally at bedtime.  Dispense: 45 g; Refill: 1  Patient was counseled extensively about nutrition and exercise  Return in about 6 months (around 04/30/2014), or if symptoms worsen or fail to improve, for Follow  up Pain and comorbidities.  The patient was given clear instructions to go to ER or return to medical center if symptoms don't improve, worsen or new problems develop. The patient verbalized understanding. The patient was told to call to get lab results if they haven't heard anything in the next week.   This note has been created with Education officer, environmentalDragon speech recognition software and smart phrase technology. Any transcriptional errors are unintentional.    Jeanann LewandowskyJEGEDE, Affie Gasner, MD, MHA, FACP, FAAP [facility] [place], [place] [phone]   10/28/2013, 10:59 AM

## 2013-10-28 NOTE — Progress Notes (Signed)
Since June pt has had swelling and pain in her left knee. Pt also has pain in both of her wrist.

## 2013-10-28 NOTE — Patient Instructions (Signed)
Dolor en la rodilla (Knee Pain) La rodilla es la articulacin compleja entre el muslo y la parte inferior de la pierna. En esta articulacin hay huesos, tendones, ligamentos y Database administrator. Los huesos que forman la rodilla son:  El fmur en el muslo.  La tibia y el peron en la pierna.  La rtula montada en la ranura de la parte inferior del muslo. [place] es una causa frecuente de [place] y puede tener varias causas. Algunas son:  Lesiones como:  Ruptura de ligamento o lesin en el tendn.  Esguince del cartlago  Enfermedades como:  Gota.  Artritis.  Infecciones.  Uso excesivo, demasiado entrenamiento o mucha actividad fsica. El dolor de rodilla puede ser leve o intenso. Puede acompaar una lesin debilitante. Los problemas leves con frecuencia responden bien a tratamientos caseros o se mejoran por s mismas. Las lesiones ms graves pueden requerir la intervencin del mdico y Rennis Petty. SNTOMAS La rodilla es una articulacin compleja. Los sntomas pueden variar ampliamente Algunos son:  Dolor con el movimiento o al soportar peso.  Hinchazn y Social research officer, government.  Torsin de la rodilla.  Imposibilidad para estirar la rodilla.  La rodilla se traba y no puede enderezarla.  Siente calor y se observa enrojecimiento con dolor y [place].  Deformidad o dislocacin de la rtula. DIAGNSTICO Determinar cual es el problema puede ser bastante simple, como cuando hay una lesin. Tambin puede ser [employer] debido a la complejidad de la rodilla. Las pruebas para Optometrist un diagnstico son:  Shirleen Schirmer y examen fsico por parte del mdico.  Radiografas para descartar otros problemas. Las radiografas no mostrarn la ruptura del Database administrator. Algunas lesiones en la rodilla pueden diagnosticarse del siguiente modo:  La artroscopia es una tcnica quirrgica por la que una pequea cmara de vdeo se inserta en pequeas incisiones que se hacen a los lados de la  rodilla. Este procedimiento se [place] para examinar y Psychiatric nurse los problemas de la articulacin interna de la rodilla. Se utilizan pequeos instrumentos para reparar el cartlago roto (meniscos).  La artrografa es una tcnica radiolgica. Se inyecta un lquido de contraste en la articulacin de la rodilla. Las estructuras internas de la articulacin de la rodilla se hacen visibles en una pelcula de rayos X.  Las imgenes por resonancia magntica son un procedimiento en el que los campos magnticos y una computadora producen imgenes en dos o tres dimensiones del interior de la rodilla. La ruptura del cartlago es visible con esta tcnica. La resonancia magntica ha reemplazado a la artrografa en el diagnstico de la ruptura del cartlago de la rodilla.  Anlisis de [place].  Examen del lquido que lubrica la articulacin de la rodilla (lquido sinovial). Se realiza tomando [place] con [place]. TRATAMIENTO El tratamiento de los problemas de la rodilla depende fundamentalmente de la causa. Algunos de estos tratamientos son:  Segn sea la lesin, un yeso o entablillado, ciruga o fisioterapia.  Permtase el tiempo adecuado de recuperacin. No use demasiado su extremidad lesionada. Si siente dolor durante los ejercicios de rutina, suspndalos. Hgalos ms lentos o realice menos repeticiones.  En el caso de actividades repetitivas como andar en bicicleta o correr, mantenga la fuerza y Ardelia Mems buena nutricin.  Alterne los grupos musculares. Por ejemplo, si levanta pesas, trabaje la parte superior del cuerpo Optician, dispensing, y la parte inferior al da siguiente.  Ni los msculos firmes ni los dbiles proporcionan un sostn adecuado a la rodilla. Los msculos no absorben el estrs que  se ejerce sobre la articulacin de la rodilla. Mantenga fuertes los msculos que rodean a la rodilla.  Cudese de los problemas mecnicos:  Si tiene pie plano, los zapatos ortopdicos o especiales pueden ayudar.  Comunquese con el profesional que lo asiste si necesita ayuda adicional.  Los soporte de arco con bordes en la zona interna o interna del taco pueden ayudar. Cambian la presin del lado de la rodilla ms comprometido por la osteoartritis.  Podrn colocarle una ortesis de rodilla para aliviar la presin en la zona ms artrtica de la rodilla.  Si el profesional le ha prescripto muletas, ortesis, un vendaje o hielo, hgalo segn las indicaciones. El acrnimo para este tratamiento es PRICE. Significa proteccin, reposo, hielo, compresin y elevacin.  Los antiinflamatorios no esteroides, pueden ayudar a Best boy. Pero si se toman inmediatamente luego de la lesin, podran aumentar la hinchazn. Tome los corticoides luego de Soil scientist. Suspndalos si tiene problemas estomacales. No los tome si tiene una historia de Aeronautical engineer, Social research officer, government en el estmago o hemorragia intestinal. No lo tome sin la aprobacin del profesional que la asiste si tiene problemas de retencin de lquidos, insuficencia cardaca o problemas renales.  En los casos crnicos, la fisioterapia puede ser de [place].  La glucosamina y el condroitin son suplementos dietarios de Radio broadcast assistant. Ambos pueden Best boy de la osteoartritis de la rodilla. Estos medicamentos son diferentes de los antiinflamatorios habituales. La glucosamina puede disminuir el porcentaje de destruccin del cartlago.  Las inyecciones de corticoides en la articulacin de la rodilla reducen los sntomas de un brote de artritis. Ofrecen alivio que dura algunos meses. Hay que esperar algunos meses entre la aplicacin de inyecciones. Las inyecciones tiene un pequeo riesgo de infeccin, retencin de lquidos y Agricultural consultant de los niveles de Museum/gallery exhibitions officer.  El cido hialurnico inyectado en las articulaciones lesionadas puede aliviar el dolor y proporciona lubricacin. Estas inyecciones funcionan bien reduciendo la inflamacin. Una serie de inyecciones puede  proporcionar alivio durante seis meses.  [place]. Aplicar ciertos ungentos sobre la piel puede ayudar a Best boy y la rigidez de la osteoartritis. Consulte con el farmacutico, si es necesario. Muchos medicamentos de venta libre estn aprobados para el alivio temporario del dolor artrtico.  En algunos pases los mdicos prescriben antiinflamatorios no esteroides para el alivio de los trastornos crnicos como la artritis y la tendinitis. Un estudio del tratamiento con antiinflamatorios no esteroides aplicados en crema, demostr que funcionaban bien, as como administrados por va oral, pero sin el peligro de los [place]. PREVENCIN  Mantenga un peso normal. Los kilos de ms agregan tensin a las articulaciones.  Mantngase fuerte y gil. Los msculos dbiles son Ardelia Mems causa frecuente de lesiones en la rodilla. La elongacin es importante. Incluya ejercicios de flexibilidad en sus rutinas.  Practique actividad fsica con inteligencia. Si sufre osteoartritis, dolor crnico en la rodilla o lesiones recurrentes, podr ser necesario que modifique el modo en que se ejercita. No significa que deba volverse inactivo. Si le duelen las rodillas despus de correr o jugar basketball, considere la prctica de la natacin, ejercicios aerbicos en el agua u otras actividades de bajo [place], al menos durante algunos das o [employer]. En algunos casos, el [employer] actividades de alto impacto ofrece [place].  Asegrese que sus zapatos le [place]. Elija el calzado deportivo adecuado para su deporte.  Proteja sus rodillas. Use la proteccin adecuada para las actividades que puedan afectar a sus rodillas. Use rodilleras cuando juegue al  vley o se arrodille. Colquese el cinturn de seguridad cada vez que conduzca. La mayor parte de las fracturas de rtula ocurren en accidentes automvilsticos.  Descanse cuando se sienta cansado. SOLICITE ATENCIN MDICA SI: Tiene dolor en la  rodilla que es continuo y no parece mejorar.  SOLICITE ATENCIN MDICA DE INMEDIATO SI:  La articulacin de la rodilla se siente caliente al tacto y usted tiene fiebre. EST SEGURO QUE:   Comprende las instrucciones para el alta mdica.  Controlar su enfermedad.  Solicitar atencin mdica de inmediato segn las indicaciones. Document Released: 08/24/2007 Document Revised: 05/30/2011 [facility] Patient Information 2015 [place], [place]. This information is not intended to replace advice given to you by your health care provider. Make sure you discuss any questions you have with your health care provider. Knee Pain The knee is the complex joint between your thigh and your lower leg. It is made up of bones, tendons, ligaments, and cartilage. The bones that make up the knee are:  The femur in the thigh.  The tibia and fibula in the lower leg.  The patella or kneecap riding in the groove on the lower femur. CAUSES  Knee pain is a common complaint with many causes. A few of these causes are:  Injury, such as:  A ruptured ligament or tendon injury.  Torn cartilage.  Medical conditions, such as:  Gout  Arthritis  Infections  Overuse, over training, or overdoing a physical activity. Knee pain can be minor or severe. Knee pain can accompany debilitating injury. Minor knee problems often respond well to self-care measures or get well on their own. More serious injuries may need medical intervention or even surgery. SYMPTOMS The knee is complex. Symptoms of knee problems can vary widely. Some of the problems are:  Pain with movement and weight bearing.  Swelling and tenderness.  Buckling of the knee.  Inability to straighten or extend your knee.  Your knee locks and you cannot straighten it.  Warmth and redness with pain and fever.  Deformity or dislocation of the kneecap. DIAGNOSIS  Determining what is wrong may be very straight forward such as when there is an injury. It  can also be challenging because of the complexity of the knee. Tests to make a diagnosis may include:  Your caregiver taking a history and doing a physical exam.  Routine X-rays can be used to rule out other problems. X-rays will not reveal a cartilage tear. Some injuries of the knee can be diagnosed by:  Arthroscopy a surgical technique by which a small video camera is inserted through tiny incisions on the sides of the knee. This procedure is used to examine and repair internal knee joint problems. Tiny instruments can be used during arthroscopy to repair the torn knee cartilage (meniscus).  Arthrography is a radiology technique. A contrast liquid is directly injected into the knee joint. Internal structures of the knee joint then become visible on X-ray film.  An MRI scan is a non X-ray radiology procedure in which magnetic fields and a computer produce two- or three-dimensional images of the inside of the knee. Cartilage tears are often visible using an MRI scanner. MRI scans have largely replaced arthrography in diagnosing cartilage tears of the knee.  Blood work.  Examination of the fluid that helps to lubricate the knee joint (synovial fluid). This is done by taking a sample out using a needle and a syringe. TREATMENT The treatment of knee problems depends on the cause. Some of these treatments are:  Depending on the injury, proper casting, splinting, surgery, or physical therapy care will be needed.  Give yourself adequate recovery time. Do not overuse your joints. If you begin to get sore during workout routines, back off. Slow down or do fewer repetitions.  For repetitive activities such as cycling or running, maintain your strength and nutrition.  Alternate muscle groups. For example, if you are a weight lifter, work the upper body on one day and the lower body the next.  Either tight or weak muscles do not give the proper support for your knee. Tight or weak muscles do not  absorb the stress placed on the knee joint. Keep the muscles surrounding the knee strong.  Take care of mechanical problems.  If you have flat feet, orthotics or special shoes may help. See your caregiver if you need help.  Arch supports, sometimes with wedges on the inner or outer aspect of the heel, can help. These can shift pressure away from the side of the knee most bothered by osteoarthritis.  A brace called an "unloader" brace also may be used to help ease the pressure on the most arthritic side of the knee.  If your caregiver has prescribed crutches, braces, wraps or ice, use as directed. The acronym for this is PRICE. This means protection, rest, ice, compression, and elevation.  Nonsteroidal anti-inflammatory drugs (NSAIDs), can help relieve pain. But if taken immediately after an injury, they may actually increase swelling. Take NSAIDs with food in your stomach. Stop them if you develop stomach problems. Do not take these if you have a history of ulcers, stomach pain, or bleeding from the bowel. Do not take without your caregiver's approval if you have problems with fluid retention, heart failure, or kidney problems.  For ongoing knee problems, physical therapy may be helpful.  Glucosamine and chondroitin are over-the-counter dietary supplements. Both may help relieve the pain of osteoarthritis in the knee. These medicines are different from the usual anti-inflammatory drugs. Glucosamine may decrease the rate of cartilage destruction.  Injections of a corticosteroid drug into your knee joint may help reduce the symptoms of an arthritis flare-up. They may provide pain relief that lasts a few months. You may have to wait a few months between injections. The injections do have a small increased risk of infection, water retention, and elevated blood sugar levels.  Hyaluronic acid injected into damaged joints may ease pain and provide lubrication. These injections may work by reducing  inflammation. A series of shots may give relief for as long as 6 months.  Topical painkillers. Applying certain ointments to your skin may help relieve the pain and stiffness of osteoarthritis. Ask your pharmacist for suggestions. Many over the-counter products are approved for temporary relief of arthritis pain.  In some countries, doctors often prescribe topical NSAIDs for relief of chronic conditions such as arthritis and tendinitis. A review of treatment with NSAID creams found that they worked as well as oral medications but without the serious side effects. PREVENTION  Maintain a healthy weight. Extra pounds put more strain on your joints.  Get strong, stay limber. Weak muscles are a common cause of knee injuries. Stretching is important. Include flexibility exercises in your workouts.  Be smart about exercise. If you have osteoarthritis, chronic knee pain or recurring injuries, you may need to change the way you exercise. This does not mean you have to stop being active. If your knees ache after jogging or playing basketball, consider switching to swimming, water aerobics, or other  low-impact activities, at least for a few days a week. Sometimes limiting high-impact activities will provide relief.  Make sure your shoes fit well. Choose footwear that is right for your sport.  Protect your knees. Use the proper gear for knee-sensitive activities. Use kneepads when playing volleyball or laying carpet. Buckle your seat belt every time you drive. Most shattered kneecaps occur in car accidents.  Rest when you are tired. SEEK MEDICAL CARE IF:  You have knee pain that is continual and does not seem to be getting better.  SEEK IMMEDIATE MEDICAL CARE IF:  Your knee joint feels hot to the touch and you have a high fever. MAKE SURE YOU:   Understand these instructions.  Will watch your condition.  Will get help right away if you are not doing well or get worse. Document Released: 01/02/2007  Document Revised: 05/30/2011 Document Reviewed: 01/02/2007 [facility] Patient Information 2015 [place], [place]. This information is not intended to replace advice given to you by your health care provider. Make sure you discuss any questions you have with your health care provider.

## 2013-10-30 LAB — ANA: ANA: NEGATIVE

## 2013-11-08 ENCOUNTER — Telehealth: Payer: Self-pay | Admitting: Internal Medicine

## 2013-11-08 NOTE — Telephone Encounter (Signed)
Pt. Calling because the pain on her left side has continued. Pt. Came in for an office visit on 10/28/13 and was given medication. Pt. Would like to know what she should do. Please f/u with pt .

## 2013-11-08 NOTE — Telephone Encounter (Signed)
Pt. Called stating that her inflammation has not gone away even after finishing the medication prescribed by Dr. On 10/28/2013. Please f/u with pt.

## 2013-11-11 ENCOUNTER — Telehealth: Payer: Self-pay | Admitting: Emergency Medicine

## 2013-11-22 ENCOUNTER — Telehealth: Payer: Self-pay | Admitting: Internal Medicine

## 2013-11-22 NOTE — Telephone Encounter (Signed)
Patient is concerned about rib pain she has been having for the last two years. The medication prescribed didn't help and she still has rib pain. Please f/u with Patient

## 2013-11-27 ENCOUNTER — Telehealth: Payer: Self-pay | Admitting: Internal Medicine

## 2013-11-27 ENCOUNTER — Telehealth: Payer: Self-pay | Admitting: Emergency Medicine

## 2013-11-27 NOTE — Telephone Encounter (Signed)
Pt. Called again stating that she has not received a call back about the pain and it has gotten worse.Tracie [name] Please f/u with pt.

## 2013-11-27 NOTE — Telephone Encounter (Signed)
Spoke to pt regarding left side rib cage pain x 2 years Pt states the pain has worsened with movement. Pt thought she was given pain reliever at last office visit but medication Indocin was given for inflammation of knee Informed pt she will need to schedule f/u appointment with Dr. Hyman Hopes Scheduled appt given 12/09/13 @ 230 pm Spanish interpretor present

## 2013-12-04 ENCOUNTER — Telehealth: Payer: Self-pay | Admitting: Internal Medicine

## 2013-12-04 NOTE — Telephone Encounter (Signed)
Patient came to verify her appointment for next week. Nurse scheduled Patient for a 66' visit on Monday 9/21 at 2:30PM. However, Patient needs an Interpreter.

## 2013-12-04 NOTE — Telephone Encounter (Signed)
Patient is concerned because her current menstrual cycle is lasting for ten days so far. And this is the first time she has this situation.  Please follow up with Patient in the Lobby or by phone [phone].

## 2013-12-05 ENCOUNTER — Encounter (HOSPITAL_COMMUNITY): Payer: Self-pay | Admitting: Emergency Medicine

## 2013-12-05 ENCOUNTER — Emergency Department (HOSPITAL_COMMUNITY): Payer: No Typology Code available for payment source

## 2013-12-05 ENCOUNTER — Emergency Department (HOSPITAL_COMMUNITY)
Admission: EM | Admit: 2013-12-05 | Discharge: 2013-12-05 | Disposition: A | Discharge status: Home / Self Care | Payer: No Typology Code available for payment source | Attending: Emergency Medicine | Admitting: Emergency Medicine

## 2013-12-05 DIAGNOSIS — Z3202 Encounter for pregnancy test, result negative: Secondary | ICD-10-CM | POA: Insufficient documentation

## 2013-12-05 DIAGNOSIS — N898 Other specified noninflammatory disorders of vagina: Secondary | ICD-10-CM | POA: Insufficient documentation

## 2013-12-05 DIAGNOSIS — N949 Unspecified condition associated with female genital organs and menstrual cycle: Secondary | ICD-10-CM | POA: Insufficient documentation

## 2013-12-05 DIAGNOSIS — Z79899 Other long term (current) drug therapy: Secondary | ICD-10-CM | POA: Insufficient documentation

## 2013-12-05 DIAGNOSIS — N938 Other specified abnormal uterine and vaginal bleeding: Secondary | ICD-10-CM | POA: Insufficient documentation

## 2013-12-05 DIAGNOSIS — N925 Other specified irregular menstruation: Secondary | ICD-10-CM | POA: Insufficient documentation

## 2013-12-05 LAB — CBC WITH DIFFERENTIAL/PLATELET
BASOS PCT: 0 % (ref 0–1)
Basophils Absolute: 0 10*3/uL (ref 0.0–0.1)
Eosinophils Absolute: 0.1 10*3/uL (ref 0.0–0.7)
Eosinophils Relative: 1 % (ref 0–5)
HCT: 33.9 % — ABNORMAL LOW (ref 36.0–46.0)
HEMOGLOBIN: 10.7 g/dL — AB (ref 12.0–15.0)
Lymphocytes Relative: 25 % (ref 12–46)
Lymphs Abs: 1.3 10*3/uL (ref 0.7–4.0)
MCH: 26.5 pg (ref 26.0–34.0)
MCHC: 31.6 g/dL (ref 30.0–36.0)
MCV: 83.9 fL (ref 78.0–100.0)
MONO ABS: 0.4 10*3/uL (ref 0.1–1.0)
MONOS PCT: 8 % (ref 3–12)
Neutro Abs: 3.5 10*3/uL (ref 1.7–7.7)
Neutrophils Relative %: 66 % (ref 43–77)
Platelets: 243 10*3/uL (ref 150–400)
RBC: 4.04 MIL/uL (ref 3.87–5.11)
RDW: 13.1 % (ref 11.5–15.5)
WBC: 5.3 10*3/uL (ref 4.0–10.5)

## 2013-12-05 LAB — URINALYSIS, ROUTINE W REFLEX MICROSCOPIC
Bilirubin Urine: NEGATIVE
Glucose, UA: NEGATIVE mg/dL
Ketones, ur: NEGATIVE mg/dL
Leukocytes, UA: NEGATIVE
NITRITE: NEGATIVE
PROTEIN: 30 mg/dL — AB
Specific Gravity, Urine: 1.012 (ref 1.005–1.030)
UROBILINOGEN UA: 0.2 mg/dL (ref 0.0–1.0)
pH: 7.5 (ref 5.0–8.0)

## 2013-12-05 LAB — COMPREHENSIVE METABOLIC PANEL
ALBUMIN: 3.8 g/dL (ref 3.5–5.2)
ALT: 12 U/L (ref 0–35)
ANION GAP: 13 (ref 5–15)
AST: 10 U/L (ref 0–37)
Alkaline Phosphatase: 47 U/L (ref 39–117)
BUN: 8 mg/dL (ref 6–23)
CO2: 20 mEq/L (ref 19–32)
CREATININE: 0.53 mg/dL (ref 0.50–1.10)
Calcium: 8.8 mg/dL (ref 8.4–10.5)
Chloride: 105 mEq/L (ref 96–112)
GFR calc Af Amer: >90 mL/min (ref >90)
GFR calc non Af Amer: >90 mL/min (ref >90)
Glucose, Bld: 100 mg/dL — ABNORMAL HIGH (ref 70–99)
POTASSIUM: 4.3 meq/L (ref 3.7–5.3)
Sodium: 138 mEq/L (ref 137–147)
TOTAL PROTEIN: 6.8 g/dL (ref 6.0–8.3)
Total Bilirubin: <0.2 mg/dL — ABNORMAL LOW (ref 0.3–1.2)

## 2013-12-05 LAB — URINE MICROSCOPIC-ADD ON

## 2013-12-05 LAB — WET PREP, GENITAL
Clue Cells Wet Prep HPF POC: NONE SEEN
TRICH WET PREP: NONE SEEN
YEAST WET PREP: NONE SEEN

## 2013-12-05 LAB — PREGNANCY, URINE: PREG TEST UR: NEGATIVE

## 2013-12-05 MED ORDER — MEDROXYPROGESTERONE ACETATE 5 MG PO TABS: 10.0000 mg | ORAL_TABLET | Freq: Every day | ORAL | Status: DC

## 2013-12-05 NOTE — ED Provider Notes (Signed)
Medical screening examination/treatment/procedure(s) were performed by non-physician practitioner and as supervising physician I was immediately available for consultation/collaboration.   EKG Interpretation None        Tomasita Crumble, MD 12/05/13 1329

## 2013-12-05 NOTE — ED Notes (Signed)
Pt. reports vaginal bleeding for 11 days denies abdominal pain or cramping .

## 2013-12-05 NOTE — Discharge Instructions (Signed)
Please follow up with your primary care physician in 1-2 days. If you do not have one please call the [facility] number listed above. Please follow up with an Ob/Gyn to schedule a follow up appointment.  Please take the medications prescribed to you as above. Please read all discharge instructions and return precautions.    Sangrado uterino anormal (Abnormal Uterine Bleeding) El sangrado uterino anormal puede afectar a las mujeres que estn en diversas etapas de la vida, desde adolescentes, mujeres frtiles y Probation officer, hasta mujeres que han llegado a la menopausia. Hay diversas clases de sangrado uterino que se consideran anormales, entre ellas:  Prdidas de sangre o [employer] perodos.  Hemorragias luego de Sales promotion account executive.  Sangrado abundante o ms que lo habitual.  Perodos que duran ms que lo normal.  Sangrado luego de la menopausia. Muchos casos de sangrado uterino anormal son leves y simples de tratar, mientras que otros son ms graves. El mdico debe evaluar cualquier clase de sangrado anormal. El tratamiento depender de la causa del sangrado. INSTRUCCIONES PARA EL CUIDADO EN EL HOGAR Controle su afeccin para ver si hay cambios. Las siguientes indicaciones ayudarn a Architectural technologist que pueda sentir:  Evite las duchas vaginales y el uso de tampones segn las indicaciones del mdico.  Cmbiese las compresas con frecuencia. Deber hacerse exmenes plvicos regulares y pruebas de [place]apan[place]. Cumpla con todas las visitas de control y [employer] diagnsticos, segn le indique su mdico.  SOLICITE ATENCIN MDICA SI:   El sangrado dura ms de 1 semana.  Se siente mareada por momentos. SOLICITE ATENCIN MDICA DE INMEDIATO SI:   Se desmaya.  Debe cambiarse la compresa cada 15 a 30 minutos.  Siente dolor abdominal.  Lance Muss.  Se siente dbil o presenta sudoracin.  Elimina cogulos grandes por la  vagina.  Comienza a sentir nuseas y vomita. ASEGRESE DE QUE:   Comprende estas instrucciones.  Controlar su afeccin.  Recibir ayuda de inmediato si no mejora o si empeora. Document Released: 03/07/2005 Document Revised: 03/12/2013 [facility] Patient Information 2015 [place], [place]. This information is not intended to replace advice given to you by your health care provider. Make sure you discuss any questions you have with your health care provider.

## 2013-12-05 NOTE — ED Provider Notes (Signed)
CSN: [id number]     Arrival date & time 12/05/13  0408 History   First MD Initiated Contact with Patient 12/05/13 [phone]     Chief Complaint  Patient presents with  . Vaginal Bleeding     (Consider location/radiation/quality/duration/timing/severity/associated sxs/prior Treatment) HPI Comments: Patient is a G69P1 33 yo F with no known chronic medical problems presented to the emergency department for 11 days of painless vaginal bleeding. Patient states she initially was using one menstrual pad every fours hours until this morning when she was requiring 1 menstrual pad every two hours. She has noticed clots. Denies any history of irregular or heavy menstrual cycles in the past. Patient states she has had a regular menstrual cycle every month, but the last two months she has had spotting in between cycles. She does not take any OCPs or have any implantable devices. Denies any pelvic or abdominal surgeries.   Patient is a 33 y.o. female presenting with vaginal bleeding.  Vaginal Bleeding   History reviewed. No pertinent past medical history. History reviewed. No pertinent past surgical history. No family history on file. History  Substance Use Topics  . Smoking status: Never Smoker   . Smokeless tobacco: Not on file  . Alcohol Use: No   OB History   Grav Para Term Preterm Abortions TAB SAB Ect Mult Living                 Review of Systems  Genitourinary: Positive for vaginal bleeding.  All other systems reviewed and are negative.     Allergies  Review of patient's allergies indicates no known allergies.  Home Medications   Prior to Admission medications   Medication Sig Start Date End Date Taking? Authorizing Provider  ibuprofen (ADVIL,MOTRIN) 200 MG tablet Take 400 mg by mouth every 6 (six) hours as needed for fever.   Yes Historical Provider, MD  medroxyPROGESTERone (PROVERA) 5 MG tablet Take 2 tablets (10 mg total) by mouth daily. 12/05/13   Sedric Guia L Ipek Westra, PA-C    BP 118/66  Pulse 69  Temp(Src) 98.1 F (36.7 C) (Oral)  Resp 16  Ht  (1.676 m)  Wt 148 lb (67.132 kg)  BMI 23.90 kg/m2  SpO2 99%  LMP 11/07/2013 Physical Exam  Nursing note and vitals reviewed. Constitutional: She is oriented to person, place, and time. She appears well-developed and well-nourished. No distress.  HENT:  Head: Normocephalic and atraumatic.  Right Ear: External ear normal.  Left Ear: External ear normal.  Nose: Nose normal.  Mouth/Throat: Oropharynx is clear and moist. No oropharyngeal exudate.  Eyes: Conjunctivae are normal.  Neck: Normal range of motion. Neck supple.  Cardiovascular: Normal rate, regular rhythm and normal heart sounds.   Pulmonary/Chest: Effort normal and breath sounds normal. No respiratory distress.  Abdominal: Soft. There is no tenderness.  Musculoskeletal: Normal range of motion.  Neurological: She is alert and oriented to person, place, and time.  Skin: Skin is warm and dry. She is not diaphoretic.  Psychiatric: She has a normal mood and affect.   Exam performed by Francee Piccolo L,  exam chaperoned Date: 12/05/2013 Pelvic exam: normal external genitalia without evidence of trauma. VULVA: normal appearing vulva with no masses, tenderness or lesion. VAGINA: normal appearing vagina with normal color and discharge, no lesions. CERVIX: normal appearing cervix without lesions, cervical motion tenderness absent, cervical os closed with out purulent discharge; vaginal discharge - bloody with clots noted, Wet prep and DNA probe for chlamydia and GC obtained.  ADNEXA: normal adnexa in size, nontender and no masses UTERUS: uterus is normal size, shape, consistency and nontender.   ED Course  Procedures (including critical care time) Medications - No data to display  Labs Review Labs Reviewed  WET PREP, GENITAL - Abnormal; Notable for the following:    WBC, Wet Prep HPF POC MODERATE (*)    All other components within normal  limits  CBC WITH DIFFERENTIAL - Abnormal; Notable for the following:    Hemoglobin 10.7 (*)    HCT 33.9 (*)    All other components within normal limits  COMPREHENSIVE METABOLIC PANEL - Abnormal; Notable for the following:    Glucose, Bld 100 (*)    Total Bilirubin <0.2 (*)    All other components within normal limits  URINALYSIS, ROUTINE W REFLEX MICROSCOPIC - Abnormal; Notable for the following:    APPearance CLOUDY (*)    Hgb urine dipstick LARGE (*)    Protein, ur 30 (*)    All other components within normal limits  GC/CHLAMYDIA PROBE AMP  PREGNANCY, URINE  URINE MICROSCOPIC-ADD ON    Imaging Review US Transvaginal Non-ob  12/05/2013   CLINICAL DATA:  Vaginal bleeding.  Rule out torsion.  EXAM: TRANSABDOMINAL AND TRANSVAGINAL ULTRASOUND OF PELVIS  DOPPLER ULTRASOUND OF OVARIES  TECHNIQUE: Both transabdominal and transvaginal ultrasound examinations of the pelvis were performed. Transabdominal technique was performed for global imaging of the pelvis including uterus, ovaries, adnexal regions, and pelvic cul-de-sac.  It was necessary to proceed with endovaginal exam following the transabdominal exam to visualize the uterus, ovaries, and endometrium. Color and duplex Doppler ultrasound was utilized to evaluate blood flow to the ovaries.  COMPARISON:  09/11/2013  FINDINGS: Uterus  Measurements: 8.8 x 5.2 x 5.8 cm. No fibroids or other mass visualized.  Endometrium  Thickness: 12 mm.  No focal abnormality visualized.  Right ovary  Measurements: 2.2 x 1.3 x 1.5 cm. Normal appearance/no adnexal mass.  Left ovary  Measurements: 3.8 x 2.3 x 3.4 cm. Normal appearance/no adnexal mass. 2.5 x 2.2 x 2.5 cm simple cyst incidentally noted.  Pulsed Doppler evaluation of both ovaries demonstrates normal low-resistance arterial and venous waveforms.  Other findings  Small amount of free fluid in the pelvis.  IMPRESSION: 1. Normal appearance of the uterus and ovaries. No evidence of ovarian torsion during this  examination. 2. Small amount of pelvic free fluid, likely physiologic.   Electronically Signed   By: Sebastian Ache   On: 12/05/2013 08:03   US Pelvis Complete  12/05/2013   CLINICAL DATA:  Vaginal bleeding.  Rule out torsion.  EXAM: TRANSABDOMINAL AND TRANSVAGINAL ULTRASOUND OF PELVIS  DOPPLER ULTRASOUND OF OVARIES  TECHNIQUE: Both transabdominal and transvaginal ultrasound examinations of the pelvis were performed. Transabdominal technique was performed for global imaging of the pelvis including uterus, ovaries, adnexal regions, and pelvic cul-de-sac.  It was necessary to proceed with endovaginal exam following the transabdominal exam to visualize the uterus, ovaries, and endometrium. Color and duplex Doppler ultrasound was utilized to evaluate blood flow to the ovaries.  COMPARISON:  09/11/2013  FINDINGS: Uterus  Measurements: 8.8 x 5.2 x 5.8 cm. No fibroids or other mass visualized.  Endometrium  Thickness: 12 mm.  No focal abnormality visualized.  Right ovary  Measurements: 2.2 x 1.3 x 1.5 cm. Normal appearance/no adnexal mass.  Left ovary  Measurements: 3.8 x 2.3 x 3.4 cm. Normal appearance/no adnexal mass. 2.5 x 2.2 x 2.5 cm simple cyst incidentally noted.  Pulsed Doppler evaluation of both  ovaries demonstrates normal low-resistance arterial and venous waveforms.  Other findings  Small amount of free fluid in the pelvis.  IMPRESSION: 1. Normal appearance of the uterus and ovaries. No evidence of ovarian torsion during this examination. 2. Small amount of pelvic free fluid, likely physiologic.   Electronically Signed   By: Sebastian Ache   On: 12/05/2013 08:03   [place] Art/ven Flow Abd Pelv Doppler  12/05/2013   CLINICAL DATA:  Vaginal bleeding.  Rule out torsion.  EXAM: TRANSABDOMINAL AND TRANSVAGINAL ULTRASOUND OF PELVIS  DOPPLER ULTRASOUND OF OVARIES  TECHNIQUE: Both transabdominal and transvaginal ultrasound examinations of the pelvis were performed. Transabdominal technique was performed for global imaging  of the pelvis including uterus, ovaries, adnexal regions, and pelvic cul-de-sac.  It was necessary to proceed with endovaginal exam following the transabdominal exam to visualize the uterus, ovaries, and endometrium. Color and duplex Doppler ultrasound was utilized to evaluate blood flow to the ovaries.  COMPARISON:  09/11/2013  FINDINGS: Uterus  Measurements: 8.8 x 5.2 x 5.8 cm. No fibroids or other mass visualized.  Endometrium  Thickness: 12 mm.  No focal abnormality visualized.  Right ovary  Measurements: 2.2 x 1.3 x 1.5 cm. Normal appearance/no adnexal mass.  Left ovary  Measurements: 3.8 x 2.3 x 3.4 cm. Normal appearance/no adnexal mass. 2.5 x 2.2 x 2.5 cm simple cyst incidentally noted.  Pulsed Doppler evaluation of both ovaries demonstrates normal low-resistance arterial and venous waveforms.  Other findings  Small amount of free fluid in the pelvis.  IMPRESSION: 1. Normal appearance of the uterus and ovaries. No evidence of ovarian torsion during this examination. 2. Small amount of pelvic free fluid, likely physiologic.   Electronically Signed   By: Sebastian Ache   On: 12/05/2013 08:03     EKG Interpretation None      MDM   Final diagnoses:  Dysfunctional uterine bleeding    Filed Vitals:   12/05/13 0700  BP: 118/66  Pulse: 69  Temp:   Resp:    Afebrile, NAD, non-toxic appearing, AAOx4. I have reviewed nursing notes, vital signs, and all appropriate lab and imaging results for this patient. Abdominal exam benign. Pelvic examination remarkable for blood and clots in the vagina, cervix close with no active bleeding. No CMT, adnexal fullness or mass. Ultrasound revealed without acute abnormality with only simple cyst noted. Labs reviewed, patient with drop in hemoglobin likely related to 11 days of vaginal bleeding. Will prescribe Provera to help stop bleeding with advised PCP and OB/GYN followup for dysfunctional uterine bleeding. Return precautions are extensively discussed with patient  who is agreeable to plan and stable at time of discharge.      Jeannetta Ellis, PA-C 12/05/13 1151

## 2013-12-06 LAB — GC/CHLAMYDIA PROBE AMP
CT Probe RNA: NEGATIVE
GC PROBE AMP APTIMA: NEGATIVE

## 2013-12-06 NOTE — Discharge Planning (Signed)
[facility] [place] [facility] Liaison  Patient is an active orange Lexicographer and established patient at the [facility]. Orange card application given for patient to renew her orange card. This liaison's contact information also provided for any future questions or concerns.

## 2013-12-09 ENCOUNTER — Encounter: Payer: Self-pay | Admitting: Internal Medicine

## 2013-12-09 ENCOUNTER — Ambulatory Visit: Payer: No Typology Code available for payment source | Attending: Internal Medicine | Admitting: Internal Medicine

## 2013-12-09 VITALS — BP 123/79 | HR 84 | Temp 98.7°F | Resp 16 | Ht 64.0 in | Wt 152.0 lb

## 2013-12-09 DIAGNOSIS — N949 Unspecified condition associated with female genital organs and menstrual cycle: Secondary | ICD-10-CM

## 2013-12-09 DIAGNOSIS — N939 Abnormal uterine and vaginal bleeding, unspecified: Secondary | ICD-10-CM | POA: Insufficient documentation

## 2013-12-09 DIAGNOSIS — N938 Other specified abnormal uterine and vaginal bleeding: Secondary | ICD-10-CM | POA: Insufficient documentation

## 2013-12-09 DIAGNOSIS — R1013 Epigastric pain: Secondary | ICD-10-CM | POA: Insufficient documentation

## 2013-12-09 DIAGNOSIS — N925 Other specified irregular menstruation: Secondary | ICD-10-CM | POA: Insufficient documentation

## 2013-12-09 MED ORDER — NORGESTIM-ETH ESTRAD TRIPHASIC 0.18/0.215/0.25 MG-35 MCG PO TABS: 1.0000 | ORAL_TABLET | Freq: Every day | ORAL | Status: DC

## 2013-12-09 NOTE — Progress Notes (Signed)
Pt is here following up after her visit at the ED with her left ribcage pain. Pt states that she is have numbness in her cheeks and feet.

## 2013-12-09 NOTE — Progress Notes (Signed)
Patient ID: Tracie [name], female   DOB: 15-Dec-1980, 33 y.o.   MRN: [id number]   Tracie [name], is a 33 y.o. female  WUJ:[id number]  [id number]:[id number]  DOB - [birth year]  Chief Complaint  Patient presents with  . Follow-up        Subjective:   Tracie [name] is a 33 y.o. female here today for a follow up visit. Patient with no significant past medical history was seen in the ED recently for left rib cage pain and numbness in her cheeks and feet. She also complained of painless vaginal bleeding. She is here today for follow-up ED visit. Patient stated she initially was using one menstrual pad every 4 hours until the day she went to the ED when she was requiring 1 pad every 2 hours. She also noticed clots. Denied any history of irregular or heavy menstrual period bleeding. She does not take any OCPs or any implantable devices for contraception. She has had no pelvic or abdominal surgeries. She denies any pelvic pain. She describes an epigastric pain associated with nausea but no vomiting. Patient has No headache, No chest pain, No new weakness tingling or numbness, No Cough - SOB.  Problem  Abdominal Pain, Epigastric  Dub (Dysfunctional Uterine Bleeding)    ALLERGIES: No Known Allergies  PAST MEDICAL HISTORY: History reviewed. No pertinent past medical history.  MEDICATIONS AT HOME: Prior to Admission medications   Medication Sig Start Date End Date Taking? Authorizing Provider  ibuprofen (ADVIL,MOTRIN) 200 MG tablet Take 400 mg by mouth every 6 (six) hours as needed for fever.   Yes Historical Provider, MD  medroxyPROGESTERone (PROVERA) 5 MG tablet Take 2 tablets (10 mg total) by mouth daily. 12/05/13  Yes Jennifer L Piepenbrink, PA-C  Norgestimate-Ethinyl Estradiol Triphasic (ORTHO TRI-CYCLEN, 28,) 0.18/0.215/0.25 MG-35 MCG tablet Take 1 tablet by mouth daily. 12/09/13   Quentin Angst, MD     Objective:   Filed Vitals:   12/09/13 1427  BP: 123/79  Pulse:  84  Temp: 98.7 F (37.1 C)  TempSrc: Oral  Resp: 16  Height:  (1.626 m)  Weight: 152 lb (68.947 kg)  SpO2: 100%    Exam General appearance : Awake, alert, not in any distress. Speech Clear. Not toxic looking HEENT: Atraumatic and Normocephalic, pupils equally reactive to light and accomodation Neck: supple, no JVD. No cervical lymphadenopathy.  Chest:Good air entry bilaterally, no added sounds  CVS: S1 S2 regular, no murmurs.  Abdomen: Bowel sounds present, Non tender and not distended with no gaurding, rigidity or rebound. Extremities: B/L Lower Ext shows no edema, both legs are warm to touch Neurology: Awake alert, and oriented X 3, CN II-XII intact, Non focal Data Review Lab Results  Component Value Date   HGBA1C 5.5 09/09/2013     Assessment & Plan   1. Abdominal pain, epigastric  - Helicobacter pylori special antigen  2. DUB (dysfunctional uterine bleeding)  - Norgestimate-Ethinyl Estradiol Triphasic (ORTHO TRI-CYCLEN, 28,) 0.18/0.215/0.25 MG-35 MCG tablet; Take 1 tablet by mouth daily.  Dispense: 1 Package; Refill: 11  Interpreter was used to communicate directly with patient for the entire encounter including providing detailed patient instructions.   Return in about 6 months (around 06/09/2014), or if symptoms worsen or fail to improve, for DUB, Abdominal Pain.  The patient was given clear instructions to go to ER or return to medical center if symptoms don't improve, worsen or new problems develop. The patient verbalized understanding. The patient was told to call to get lab results  if they haven't heard anything in the next week.   This note has been created with Education officer, environmental. Any transcriptional errors are unintentional.    Jeanann Lewandowsky, MD, MHA, FACP, FAAP [facility] [place], [place] [phone]   12/09/2013, 3:24 PM

## 2013-12-09 NOTE — Patient Instructions (Signed)
Hemorragia uterina disfuncional (Dysfunctional Uterine Bleeding) Normalmente, los perodos menstruales comienzan en las jvenes de entre 11 y 17 aos. Un ciclo o perodo menstrual puede repetirse entre los 23 das y los 35 das y dura entre 1 y 7 das. Entre los 12 y los 14 das antes de que comience el ciclo menstrual, se produce la ovulacin (los ovarios producen vulos). Cuando cuente los periodos, hgalo desde el primer da del comienzo de la hemorragia del perodo anterior hasta el primer da de la hemorragia del perodo siguiente. La hemorragia uterina disfuncional (anormal) es diferente del perodo menstrual normal. Los perodos pueden comenzar antes o despus que lo habitual. Pueden ser menos abundantes, presentar cogulos, o ser ms abundantes. Puede tener hemorragias entre los perodos o saltear un perodo o ms. Puede tener hemorragia luego de mantener relaciones sexuales, despus de la menopausia o faltarle el perodo menstrual. CAUSAS  Embarazo (normal, aborto espontneo, embarazo ectpico).  DIU (dispositivo intrauterino, anticonceptivo)  Pldoras anticonceptivas  Tratamiento hormonal  La menopausia  Infeccin en el cervix.  Problemas de coagulacin.  Infecciones de la superficie interna del tero.  Endometriosis: la superficie interna del tero se desarrolla en la pelvis y otros rganos femeninos.  Adherencias (tejido cicatrizal) dentro del tero.  Obesidad o severa prdida de peso.  Plipos en el tero.  Cncer en el crvix, la vagina o el tero.  Quiste de ovarios o Sndrome ovrico poliqustico.  Otras enfermedades (diabetes, enfermedad de la tiroides, etc).  Fibromas en el tero (tumores no cancerosos).  Problemas con las hormonas femeninas.  Hiperplasia del endometrio: capa gruesa y clulas agrandadas dentro del tero.  Medicamentos que interfieren con la ovulacin.  Radiacin en la pelvis o el abdomen.  Quimioterapia. DIAGNSTICO  El mdico  analizar la historia de sus perodos menstruales, los medicamentos que toma, los cambios en el peso corporal, el estrs de la vida diaria y cualquier problema mdico que tenga.  El mdico realizar un examen fsico, incluyendo un examen plvico.  Tambin le solicitar anlisis para completar el diagnstico. Ellos son:  Papanicolau.  Anlisis de sangre.  Cultivos para descartar infecciones.  Tomografa computada.  Ultrasonido.  Histeroscopa.  Laparoscopa.  Resonancia magntica.  Histerosalpingografa.  Dilatacin y curetaje.  Biopsia de endometrio. TRATAMIENTO El tratamiento depender de la causa de la hemorragia.. El tratamiento consiste en:  Observacin de los perodos menstruales durante algunos meses.  Prescripcin de medicamentos como:  Antibiticos:  Hormonas.  Pldoras anticonceptivas  Retirar el DIU (dispositivo intrauterino, anticonceptivo).  Ciruga:  Dilatacin y curetaje (raspado y remocin de tejido de la zona interna del tero).  Laparoscopa (examen del interior del abdomen con un tubo con luz).  Ablacin uterina (destruccin de la membrana que cubre el tero con corriente elctrica, rayos lser, congelamiento o calor ).  Histeroscopa (examen del cuello y el tero con un tubo con luz).  Histerectoma (extirpacin del tero). INSTRUCCIONES PARA EL CUIDADO DOMICILIARIO  Si el profesional que la asiste le prescribe medicamentos, tmelos tal como se le indic. No cambie ni reemplace medicamentos sin consultarlo con el profesional.  Las hemorragias de larga duracin pueden traen como consecuencia un dficit de hierro. El profesional que lo asiste podr prescribirle hierro. Esto ayuda a reponer el hierro que el organismo pierde luego de una hemorragia abundante. Tome los medicamentos tal como se le indic.  No tome aspirina o medicamentos que la contengan u desde una semana antes del perodo menstrual ni durante el mismo. La aspirina puede hacer  que la hemorragia empeore.    Si necesita cambiar el apsito o el tampn mas de una vez cada 2 horas, permanezca en cama con los pies elevados y aplique una compresa fra en la zona baja del abdomen. Descanse todo lo que pueda hasta que la hemorragia se detenga.  Consuma alimentos balanceados. Coma alimentos ricos en hierro. Por ejemplo:  Vegetales verdes frescos.  Cereales integrales y cereales y panes con salvado.  Huevos.  Carnes.  Hgado.  No trate de perder peso hasta que la hemorragia anormal se detenga y los niveles de hierro en la sangre vuelvan a la normalidad. No levante pesos de ms de 10 libras (4.5 kg.) ni realice actividades extenuantes mientras tenga la hemorragia.  Durante un par de meses tome nota en un almanaque y marque el comienzo y el fin de su perodo menstrual y el tipo de sangrado (escaso, medio, abundante, gotas, cogulos o falta de perodo). El objetivo es que su mdico evale mejor el problema. SOLICITE ATENCIN MDICA SI:  Debe cambiar el apsito o el tampn ms de una vez cada hora.  Se siente mareada o dbil.  Tiene algn problema que pueda relacionarse con el medicamento que est tomando.  Siente dolor.  Desea retirar su DIU.  Quiere suspender o cambiar sus pldoras anticonceptivas u hormonas.  Tiene algn tipo de hemorragia anormal mencionada anteriormente.  Tiene ms de 16 aos y an no ha tenido su perodo menstrual.  Tiene 55 aos y an tiene perodos menstruales.  Tiene alguno de los sntomas mencionados anteriormente.  Aparece una erupcin cutnea. SOLICITE ATENCIN MDICA DE INMEDIATO SI:  La temperatura oral se eleva sin motivo por encima de 38,9 C (102 F).  Comienza a sentir escalofros.  Debe cambiar el apsito o el tampn ms de una vez cada hora.  Siente dolor abdominal.  Pierde el conocimiento, se desmaya. Document Released: 12/15/2004 Document Revised: 11/30/2011 ExitCare Patient Information 2015 [employer]. This  information is not intended to replace advice given to you by your health care provider. Make sure you discuss any questions you have with your health care provider.  

## 2013-12-10 LAB — HELICOBACTER PYLORI  SPECIAL ANTIGEN: H. PYLORI Antigen: NEGATIVE

## 2013-12-16 ENCOUNTER — Ambulatory Visit: Payer: Self-pay

## 2013-12-19 ENCOUNTER — Telehealth: Payer: Self-pay | Admitting: Emergency Medicine

## 2013-12-19 NOTE — Telephone Encounter (Signed)
Called to give pt lab results. Pt states she is still experiencing abdominal pain intermit  Denies n/v/ or diarrhea Spanish interpretor Belen assisted Pt instructed to schedule f/u OV

## 2014-01-06 ENCOUNTER — Ambulatory Visit: Payer: Self-pay

## 2014-01-27 ENCOUNTER — Ambulatory Visit: Payer: Self-pay

## 2014-04-20 ENCOUNTER — Ambulatory Visit (INDEPENDENT_AMBULATORY_CARE_PROVIDER_SITE_OTHER): Payer: Self-pay | Admitting: Family Medicine

## 2014-04-20 VITALS — BP 122/72 | HR 88 | Temp 98.6°F | Resp 16 | Ht 64.0 in | Wt 153.4 lb

## 2014-04-20 DIAGNOSIS — H6503 Acute serous otitis media, bilateral: Secondary | ICD-10-CM

## 2014-04-20 DIAGNOSIS — H8111 Benign paroxysmal vertigo, right ear: Secondary | ICD-10-CM

## 2014-04-20 DIAGNOSIS — H6093 Unspecified otitis externa, bilateral: Secondary | ICD-10-CM

## 2014-04-20 MED ORDER — MECLIZINE HCL 25 MG PO TABS: 25.0000 mg | ORAL_TABLET | Freq: Three times a day (TID) | ORAL | Status: DC | PRN

## 2014-04-20 MED ORDER — PSEUDOEPHEDRINE HCL ER 120 MG PO TB12: 120.0000 mg | ORAL_TABLET | Freq: Every day | ORAL | Status: DC

## 2014-04-20 MED ORDER — ACETIC ACID 2 % OT SOLN: 4.0000 [drp] | Freq: Four times a day (QID) | OTIC | Status: DC

## 2014-04-20 NOTE — Patient Instructions (Addendum)
If you are still having any symptoms on Thursday, please come back to see me at that time so we can take the next steps.  Otitis media serosa (Serous Otitis Media) La otitis media serosa se produce cuando se acumula lquido en el espacio del odo medio. Este espacio contiene los [employer] intervienen en la audicin y [place]. El aire en el espacio del odo medio ayuda a transmitir los sonidos.  El aire llega a ese lugar a travs de las trompas de [place]. Este conducto va desde la parte posterior de la nariz (nasofaringe) hasta el espacio del [place]. Mantiene la misma presin en el odo medio que la que hay en el mundo exterior. Tambin ayuda a Forensic psychologist lquido del espacio del odo [place]. CAUSAS  La otitis media serosa se produce cuando las trompas de [place] se obstruyen. Las causas de la obstruccin pueden ser:  [employer] odos.  Resfriados y otras infecciones de las vas respiratorias superiores.  Cualquier alergia que tenga.  Irritantes como el humo del cigarrillo.  Cambios sbitos en la presin del aire (como cuando baja el avin).  Hipertrofia de adenoides.  Un bulto en la nasofaringe. Durante los resfros y las infecciones de las vas respiratorias superiores, el espacio del odo medio puede llenarse de lquido temporariamente. Esto puede ocurrir despus de una infeccin en el odo tambin. Una vez que la infeccin se mejora, el lquido generalmente drenar fuera del odo a travs de las trompas de [place]. Si esto no ocurre, se produce la otitis media serosa. SIGNOS Y SNTOMAS   Prdida auditiva.  Sensacin de llenado en el odo, sin dolor.  Los nios pequeos pueden no manifestar sntomas, pero pueden mostrar ligeros cambios en la conducta, como estar agitados, tironearse de las orejas o Automotive engineer. DIAGNSTICO  La otitis media serosa se diagnostica por medio de un examen de los odos. Podrn indicarle estudios para verificar el movimiento del tmpano. Tambin  podrn [employer]. TRATAMIENTO  El lquido con frecuencia desaparece sin tratamiento. Si la causa es una [place], un tratamiento para mejorar la alergia puede ser de [place]. El lquido que persiste durante varios meses puede requerir una Advertising account executive. Colocar un tubo pequeo en el tmpano para:  Drenar el lquido.  Restablecer el aire en el espacio del odo medio. En ciertas situaciones, podrn indicarle antibiticos para evitar la ciruga. La ciruga puede realizarse para extirpar la hipertrofia de adenoides (si esta es la causa). INSTRUCCIONES PARA EL CUIDADO EN EL HOGAR   Mantenga a los nios alejados del humo del tabaco.  Concurra a todas las visitas de control como se lo haya indicado el mdico. SOLICITE ATENCIN MDICA SI:   La audicin no mejora en 3 meses.  La audicin empeora.  Siente dolor de odos.  Tiene un drenaje por el odo.  Tiene mareos.  Tiene otitis media serosa solo en un odo o sangra por la nariz (epistaxis).  Advierte un bulto en el cuello. ASEGRESE DE QUE:  Comprende estas instrucciones.  Controlar su afeccin.  Recibir ayuda de inmediato si no mejora o si empeora. Document Released: 02/18/2008 Document Revised: 07/22/2013 [facility] Patient Information 2015 [place], [place]. This information is not intended to replace advice given to you by your health care provider. Make sure you discuss any questions you have with your health care provider. Otitis externa (Otitis Externa) La otitis externa es una infeccin bacteriana o por hongos en el conducto auditivo externo. Esta es el rea desde el tmpano [place]  exterior de [employer]. Tambin se llama "odo de nadador". CAUSAS  Las posibles causas de la infeccin son:  Alen Bleacher en agua sucia.  Humedad que queda en el odo despus de nadar o baarse.  Lesin leve en el odo (traumatismo).  Objetos atascados en el odo (cuerpo extrao).  Cortes o raspones (abrasiones) en la parte  exterior del odo. SIGNOS Y SNTOMAS  En general, el primer sntoma de infeccin es la picazn en el canal auditivo. Ms tarde, los signos y los sntomas pueden ser hinchazn y enrojecimiento del conducto auditivo, dolor de odo y supuracin de lquido de color blanco amarillento (pus). El Engineer, mining de odo puede empeorar cuando tira el lbulo de la [place]. DIAGNSTICO  Su mdico le Medical sales representative examen fsico. Podr tomar Lauris Poag de lquido de la oreja y Engineer, manufacturing bacterias u hongos. TRATAMIENTO  Las gotas antibiticas para los odos se administran generalmente durante 10 a [address]. El tratamiento tambin puede incluir analgsicos o corticoides para reducir la comezn y la hinchazn. INSTRUCCIONES PARA EL CUIDADO EN EL HOGAR   Aplique gotas de antibitico en el conducto auditivo segn lo indicado por el mdico.  Tome los medicamentos solamente como se lo haya indicado el mdico.  Si tiene diabetes, siga las instrucciones adicionales de tratamiento que le haya dado el mdico.  Oceanographer a todas las visitas de control como se lo haya indicado el mdico. PREVENCIN   Mantenga el odo seco. Use la punta de una toalla para absorber el agua del canal auditivo despus de nadar o del bao.  Evite rascarse o poner objetos en el interior del odo. Esto puede daar el conducto auditivo o eliminar la cera protectora que recubre el conducto. Esto facilita el crecimiento de las bacterias y hongos.  Evite [employer], en agua contaminada, o en las piscinas mal cloradas.  Puede usar gotas para los odos hechas de alcohol y vinagre despus de nadar. Mezcle en partes iguales el vinagre blanco y el alcohol en una botella. Ponga 3 o 4 gotas en cada odo despus de nadar. SOLICITE ATENCIN MDICA SI:   Lance Muss.  Su odo contina rojo, hinchado, le duele o supura pus despus de 3 das.  El dolor, la hinchazn o el enrojecimiento empeoran.  Sufre un dolor intenso de [place].  Tiene en la zona  detrs de la oreja roja, hinchada, le duele o est sensible. ASEGRESE DE QUE:   Comprende estas instrucciones.  Controlar su afeccin.  Recibir ayuda de inmediato si no mejora o si empeora. Document Released: 03/07/2005 Document Revised: 07/22/2013 [facility] Patient Information 2015 [place], [place]. This information is not intended to replace advice given to you by your health care provider. Make sure you discuss any questions you have with your health care provider. Vrtigo (Vertigo)  Vrtigo es la sensacin de que se est moviendo estando quieto. Puede ser peligroso si ocurre cuando est trabajado, conduciendo vehculos o realizando actividades difciles.  CAUSAS  El vrtigo se produce cuando hay un conflicto en las seales que se envan al cerebro desde los sistemas visual y sensorial del cuerpo. Hay numerosas causas que [employer], entre las que se incluyen:   Infecciones, especialmente en el odo interno.  Nelia Shi reaccin a un medicamento o mal uso de alcohol y frmacos.  Abstinencia de drogas o alcohol.  Cambios rpidos de posicin, como al [employer] o darse vuelta en la cama.  Dolor de Surveyor, minerals.  Disminucin del flujo sanguneo hacia el cerebro.  Aumento  de la presin en el cerebro por un traumatismo, infeccin, tumor o sangrado en la cabeza. SNTOMAS  Puede sentir como si el mundo da vueltas o va a caer al piso. Como hay problemas en el equilibrio, el vrtigo puede causar nuseas y vmitos. Tiene movimientos oculares involuntarios (nistagmus).  DIAGNSTICO  El vrtigo normalmente se diagnostica con un examen fsico. Si la causa no se conoce, el mdico puede indicar diagnstico por imgenes, como una resonancia magntica (imgenes por Health visitorresonancia magntica).  TRATAMIENTO  La mayor parte de los casos de vrtigo se resuelve sin TEFL teachertratamiento. Segn la causa, el mdico podr recetar ciertos medicamentos. Si se relaciona con la posicin del cuerpo, podr  recomendarle movimientos o procedimientos para corregir el problema. En algunos casos raros, si la causa del vrtigo es un problema en el odo interno, necesitar [place] ciruga.  INSTRUCCIONES PARA EL CUIDADO DOMICILIARIO  Siga las indicaciones del mdico.  Evite conducir vehculos.  Evite operar maquinarias pesadas.  Evite realizar tareas que seran peligrosas para usted u otras personas durante un episodio de vrtigo.  Comunquele al mdico si nota que ciertos medicamentos parecen asociarse con las crisis. Algunos medicamentos que se usan para tratar los episodios, en Guardian Life Insurancealgunas personas los empeoran. SOLICITE ATENCIN MDICA DE INMEDIATO SI:  Los medicamentos no [place]alivian las crisis o hacen que estas empeoren.  Tiene dificultad para hablar, caminar, siente debilidad o tiene problemas para Boeingusar los brazos, las manos o las piernas.  Comienza a sufrir un dolor de cabeza intenso.  Las nuseas y los vmitos no se Samoaalivian o se Press photographeragravan.  Aparecen trastornos visuales.  Un miembro de su familia nota cambios en su conducta.  Hay alguna modificacin en su trastorno que parece Holiday representativehacerlo empeorar en lugar de Scientist, clinical (histocompatibility and immunogenetics)mejorar. ASEGRESE DE QUE:   Comprende estas instrucciones.  Controlar su enfermedad.  Solicitar ayuda de inmediato si no mejora o si empeora. Document Released: 12/15/2004 Document Revised: 05/30/2011 [facility] Patient Information 2015 [place], [place]. This information is not intended to replace advice given to you by your health care provider. Make sure you discuss any questions you have with your health care provider.

## 2014-04-20 NOTE — Progress Notes (Addendum)
Subjective:  This chart was scribed for Tracie Sorenson, MD by Modena Jansky, ED Scribe. This patient was seen in room 2 and the patient's care was started at 3:05 PM.   Patient ID: Tracie [name], female    DOB: 04/17/1980, 34 y.o.   MRN: [id number] Chief Complaint  Patient presents with  . Otalgia    x 1 weeks  . Headache  . Dizziness    HPI HPI Comments: Tracie [name] is a 34 y.o. female who presents to the [facility] complaining of left ear pain.   She states that she has been sick for 2 weeks. She reports that she has constant moderate left ear pain that started 2 weeks ago. She denies any hearing loss. She states that she also had dizziness for 2 weeks. She reports that the room spinning dizziness is exacerbated when she changes positions, and closing her eyes provides no relief. She reports that her symptoms seem to be getting worse.   She reports that she has been having urinary frequency, headache, chills, subjective fever, and sinus pressure and pain. Pt's temperature at [facility] today was 98.6. She reports that she used ibuprofen for her symptoms without any relief. She states that she has been drinking plenty of water. She denies any sick contacts. She also denies any appetite change, nausea, vomiting, dysuria, back pain, sore throat, or cough.    History reviewed. No pertinent past medical history. No Known Allergies No current outpatient prescriptions on file prior to visit.   No current facility-administered medications on file prior to visit.   Review of Systems  Constitutional: Positive for fever and chills. Negative for appetite change.  HENT: Positive for ear pain and sinus pressure. Negative for hearing loss and sore throat.   Respiratory: Negative for cough.   Gastrointestinal: Negative for nausea and vomiting.  Genitourinary: Negative for dysuria.  Musculoskeletal: Negative for back pain.  Neurological: Positive for dizziness and  headaches.       Objective:   Physical Exam  Constitutional: She is oriented to person, place, and time. She appears well-developed and well-nourished. No distress.  HENT:  Head: Normocephalic and atraumatic.  Nasal mucosal erythema.  Oropharynx has mild erythema.  Bilateral TMs: Moderate amount of soft brown. Unable to remove with curette.   Eyes: Conjunctivae and EOM are normal. Pupils are equal, round, and reactive to light.  No nystagmus.   Neck: Neck supple. No thyromegaly present.  Cardiovascular: Normal rate, regular rhythm and normal heart sounds.  Exam reveals no gallop and no friction rub.   No murmur heard. Pulmonary/Chest: Effort normal and breath sounds normal. No respiratory distress. She has no wheezes. She has no rales.  Musculoskeletal: Normal range of motion.  Lymphadenopathy:    She has no cervical adenopathy.  Neurological: She is alert and oriented to person, place, and time.  Negative Dix-Hallpike's test bilaterally   Skin: Skin is warm and dry.  Psychiatric: She has a normal mood and affect. Her behavior is normal.  Nursing note and vitals reviewed. lAfter lavage, ear wax build up is still present in the bilateral ears. Canals have edema and tenderness, with left more than right.  BP 122/72 mmHg  Pulse 88  Temp(Src) 98.6 F (37 C) (Oral)  Resp 16  Ht  (1.626 m)  Wt 153 lb 6.4 oz (69.582 kg)  BMI 26.32 kg/m2  SpO2 100%  LMP 02/19/2014    Assessment & Plan:   3:45 PM- Bilateral ear  canal lavaged w/ copious removal of large amounts of cerumen after which pt had some minimal improvement.  TMs were difficult to visually due to small size and swelling of ear canals. Pt having some pain w/ otoscope exam and movement of tragus and pinna so suspect otitis externa.  Pt w/o insurance so will try vosol as all otic antibiotics are cost-prohibitive.  If sxs do not improve significantly within the next 4-5d, pt instructed to call as may need to be put on po amox.       Bilateral acute serous otitis media, recurrence not specified  Otitis externa, bilateral  Benign paroxysmal positional vertigo, right - unsure of etiology - there does appear to be some mid-ear effusion bilaterally so try to treat eustachian tube dysfunction as possible etiology of sxs.  Given 1 wk sample of nasocort to start in addition to below.  Meds ordered this encounter  Medications  . meclizine (ANTIVERT) 25 MG tablet    Sig: Take 1 tablet (25 mg total) by mouth 3 (three) times daily as needed for dizziness.    Dispense:  30 tablet    Refill:  0  . pseudoephedrine (SUDAFED 12 HOUR) 120 MG 12 hr tablet    Sig: Take 1 tablet (120 mg total) by mouth daily.    Dispense:  30 tablet    Refill:  0  . acetic acid (VOSOL) 2 % otic solution    Sig: Place 4 drops into both ears 4 (four) times daily.    Dispense:  15 mL    Refill:  1    I personally performed the services described in this documentation, which was scribed in my presence. The recorded information has been reviewed and considered, and addended by me as needed.  Tracie SorensonEva Larwence Tu, MD MPH

## 2014-04-21 ENCOUNTER — Emergency Department (INDEPENDENT_AMBULATORY_CARE_PROVIDER_SITE_OTHER)
Admission: EM | Admit: 2014-04-21 | Discharge: 2014-04-21 | Disposition: A | Discharge status: Home / Self Care | Payer: Self-pay | Source: Home / Self Care

## 2014-04-21 ENCOUNTER — Telehealth: Payer: Self-pay

## 2014-04-21 ENCOUNTER — Encounter (HOSPITAL_COMMUNITY): Payer: Self-pay | Admitting: *Deleted

## 2014-04-21 DIAGNOSIS — F419 Anxiety disorder, unspecified: Secondary | ICD-10-CM

## 2014-04-21 LAB — GLUCOSE, CAPILLARY: GLUCOSE-CAPILLARY: 91 mg/dL (ref 70–99)

## 2014-04-21 MED ORDER — LORAZEPAM 1 MG PO TABS: 1.0000 mg | ORAL_TABLET | Freq: Three times a day (TID) | ORAL | Status: DC | PRN

## 2014-04-21 MED ORDER — FLUOXETINE HCL 20 MG PO TABS: 20.0000 mg | ORAL_TABLET | Freq: Every day | ORAL | Status: DC

## 2014-04-21 NOTE — Discharge Instructions (Signed)
Crisis de angustia °(Panic Attacks) °Las crisis de angustia son ataques repentinos y breves de ansiedad, miedo o malestar extremos. Es posible que ocurran sin motivo, cuando está relajado, ansioso o cuando duerme. Las crisis de angustia pueden ocurrir por algunas de estas razones:  °· En ocasiones, las personas sanas presentan crisis de angustia en situaciones extremas, potencialmente mortales, como la guerra o los desastres naturales. La ansiedad normal es un mecanismo de defensa del cuerpo que nos ayuda a reaccionar ante situaciones de peligro (respuesta de defensa o huida). °· Con frecuencia, las crisis de angustia aparecen acompañadas de trastornos de ansiedad, como trastorno de pánico, trastorno de ansiedad social, trastorno de ansiedad generalizada y fobias. Los trastornos de ansiedad provocan ansiedad excesiva o incontrolable. Sus relaciones y actividades pueden verse afectadas. °· En ocasiones, las crisis de ansiedad se presentan con otras enfermedades mentales, como la depresión y el trastorno por estrés postraumático. °· Algunas enfermedades, medicamentos recetados y drogas pueden provocar crisis de angustia. °SÍNTOMAS  °Las crisis de angustia comienzan repentinamente, alcanzan el punto máximo a los 20 minutos y se presentan junto con cuatro o más de los siguientes síntomas: °· Latidos cardíacos acelerados o frecuencia cardíaca elevada (palpitaciones). °· Sudoración. °· Temblores o sacudidas. °· Dificultad para respirar o sensación de asfixia. °· Sensación de ahogo. °· Dolor o molestias en el pecho. °· Náuseas o sensación extraña en el estómago. °· Mareos, sensación de desvanecimiento o de desmayo. °· Escalofríos o sofocos. °· Hormigueos o adormecimiento en los labios o las manos y los pies. °· Sensación de que las cosas no son reales o de que no es usted mismo. °· Temor a perder el control o el juicio. °· Temor a la muerte. °Algunos de estos síntomas pueden parecerse a enfermedades graves. Por ejemplo, es  posible que piense que tendrá un ataque cardíaco. Aunque las crisis de angustia pueden ser muy atemorizantes, no son potencialmente mortales. °DIAGNÓSTICO  °Las crisis de angustia se diagnostican con una evaluación que realiza el médico. Su médico le realizará preguntas sobre los síntomas, como cuándo y dónde ocurrieron. También le preguntará sobre su historia clínica y sobre el consumo de alcohol y drogas, incluidos los medicamentos recetados. Es posible que su médico le indique análisis de sangre u otros estudios para descartar enfermedades graves. El médico podrá derivarlo a un profesional de la salud mental para que le realice una evaluación más profunda. °TRATAMIENTO  °· En general, las personas sanas que registran una o dos crisis de angustia bajo una situación extrema, potencialmente mortal, no requerirán tratamiento. °· El tratamiento de las crisis de angustia asociadas con trastornos de ansiedad u otras enfermedades mentales, generalmente, requiere orientación por parte de un profesional de la salud mental medicamentos, o bien la combinación de ambos. Su médico le ayudará a determinar el mejor tratamiento para usted. °· Las crisis de angustia asociadas a enfermedades físicas, generalmente, desaparecen con el tratamiento de la enfermedad. Si un medicamento recetado le causa crisis de angustia, consulte a su médico si debe suspenderlo, disminuir la dosis o sustituirlo por otro medicamento. °· Las crisis de angustia asociadas al consumo de drogas o alcohol desaparecen con la abstinencia. Algunos adultos necesitan ayuda profesional para dejar de beber o de consumir drogas. °INSTRUCCIONES PARA EL CUIDADO EN EL HOGAR  °· Tome todos los medicamentos como le indicó el médico. °· Planifique y concurra a todas las visitas de control, según le indique el médico. Es importante que concurra a todas las visitas. °SOLICITE ATENCIÓN MÉDICA SI: °· No puede   tomar los medicamentos como se lo han indicado. °· Los síntomas no  mejoran o empeoran. °SOLICITE ATENCIÓN MÉDICA DE INMEDIATO SI:  °· Experimenta síntomas de crisis de angustia diferentes de los que presenta habitualmente. °· Tiene pensamientos serios acerca de lastimarse a usted mismo o dañar a otras personas. °· Toma medicamentos para las crisis de angustia y presenta efectos secundarios graves. °ASEGÚRESE DE QUE: °· Comprende estas instrucciones. °· Controlará su afección. °· Recibirá ayuda de inmediato si no mejora o si empeora. °Document Released: 03/07/2005 Document Revised: 03/12/2013 °ExitCare® Patient Information ©2015 ExitCare, LLC. This information is not intended to replace advice given to you by your health care provider. Make sure you discuss any questions you have with your health care provider. ° °
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
## 2014-04-21 NOTE — Telephone Encounter (Signed)
Per Dr. Alver FisherShaw's note it appears that antibiotic drops are cost prohibitive for this patient.  She received a printed prescription.  She may want to simply try another pharmacy, as this medicine should only cost about 25 dollars. If this is a problem then I will relay this to Dr. Clelia CroftShaw. Deliah BostonMichael Alek Poncedeleon, MS, PA-C   11:57 AM, 04/21/2014

## 2014-04-21 NOTE — Telephone Encounter (Signed)
Left a detailed message on pharmacy voicemail explaining message from [place].

## 2014-04-21 NOTE — Telephone Encounter (Signed)
[facility] stating they did not carry the otic solution Dr. Clelia CroftShaw prescribed. Can we send in something else for the pt?

## 2014-04-21 NOTE — ED Notes (Signed)
Pt  Has  Anxiety  attacks  X   3  Days  History  Of  Attacks    In  Past     History  Of PTSD      Recent     Family  Passed  Away       -  Feels  desperate  And  blood is  Boiling        Appears  Anxious        some  Diarrhea   Feels  shakey  Feels  Depressed      denys  Any  Suicidal  Or homicidal  Ideations

## 2014-05-05 ENCOUNTER — Ambulatory Visit: Payer: Self-pay

## 2014-05-09 ENCOUNTER — Ambulatory Visit: Payer: Self-pay | Attending: Internal Medicine

## 2014-05-17 ENCOUNTER — Emergency Department (INDEPENDENT_AMBULATORY_CARE_PROVIDER_SITE_OTHER)
Admission: EM | Admit: 2014-05-17 | Discharge: 2014-05-17 | Disposition: A | Discharge status: Home / Self Care | Payer: Self-pay | Source: Home / Self Care | Attending: Emergency Medicine | Admitting: Emergency Medicine

## 2014-05-17 ENCOUNTER — Encounter (HOSPITAL_COMMUNITY): Payer: Self-pay | Admitting: *Deleted

## 2014-05-17 DIAGNOSIS — G43009 Migraine without aura, not intractable, without status migrainosus: Secondary | ICD-10-CM

## 2014-05-17 MED ORDER — PREDNISONE 20 MG PO TABS: 20.0000 mg | ORAL_TABLET | Freq: Two times a day (BID) | ORAL | Status: DC

## 2014-05-17 MED ORDER — METHYLPREDNISOLONE ACETATE 80 MG/ML IJ SUSP
80.0000 mg | Freq: Once | INTRAMUSCULAR | Status: AC
  Administered 2014-05-17: 80 mg via INTRAMUSCULAR

## 2014-05-17 MED ORDER — METHYLPREDNISOLONE ACETATE 80 MG/ML IJ SUSP
INTRAMUSCULAR | Status: AC
Start: 2014-05-17 — End: 2014-05-17
  Filled 2014-05-17: qty 1

## 2014-05-17 MED ORDER — SUMATRIPTAN SUCCINATE 50 MG PO TABS: 50.0000 mg | ORAL_TABLET | ORAL | Status: DC | PRN

## 2014-05-17 MED ORDER — METOCLOPRAMIDE HCL 5 MG/ML IJ SOLN
INTRAMUSCULAR | Status: AC
  Filled 2014-05-17: qty 2

## 2014-05-17 MED ORDER — METOCLOPRAMIDE HCL 5 MG/ML IJ SOLN
10.0000 mg | Freq: Once | INTRAMUSCULAR | Status: AC
  Administered 2014-05-17: 10 mg via INTRAMUSCULAR

## 2014-05-17 MED ORDER — KETOROLAC TROMETHAMINE 60 MG/2ML IM SOLN
60.0000 mg | Freq: Once | INTRAMUSCULAR | Status: AC
  Administered 2014-05-17: 60 mg via INTRAMUSCULAR

## 2014-05-17 MED ORDER — KETOROLAC TROMETHAMINE 60 MG/2ML IM SOLN
INTRAMUSCULAR | Status: AC
  Filled 2014-05-17: qty 2

## 2014-05-17 NOTE — ED Provider Notes (Signed)
Chief Complaint   Headache   History of Present Illness   Tracie [name] is a 34 year old female who was seen here about 3 weeks ago because of anxiety. She presents tonight with a headache that's been going on for 3 days. The headache began gradually and has been intermittent. She gets relief with over-the-counter pain meds, within the headache comes back again. She describes an 8/10 burning, itching, and heat sensation in her left parietal area. She denies any nausea or vomiting. She has had some photophobia and phonophobia. No fever, stiff neck, diplopia, blurry vision, or visual aura. She denies any nasal congestion or drainage. She's had no neurological symptoms such as numbness, tingling, weakness, trouble with speech, ambulation, coordination, or balance. She does admit to feeling tired, nervous, dizzy, poor sleep, and aching in her jaw area. She denies any use of anticoagulants, head or neck trauma, denies being pregnant or in the postpartum period, no one else at home as been sick with anything like this, no eye pain, no jaw claudication, no history of blood clots or thrombophlebitis. The patient states she has had headaches like this in the past for which she has tried over-the-counter medication with good results. She's never seen a physician for them. She's never been diagnosed with migraines. She denies that this is the worst headache of her life.  Review of Systems   Other than as noted above, the patient denies any of the following symptoms: Systemic:  No fever, chills, photophobia, or stiff neck. Eye:  No blurred vision, or diplopia. ENT:  No nasal congestion, rhinorrhea, sinus pressure or pain, or sore throat.  No jaw claudication. Neuro:  No paresthesias, loss of consciousness, seizure activity, muscle weakness, trouble with coordination or gait, trouble speaking or swallowing. Psych:  No depression, anxiety or trouble sleeping.  PMFSH   Past medical history, family  history, social history, meds, and allergies were reviewed.    Physical Examination    Vital signs:  BP 133/89 mmHg  Pulse 87  Temp(Src) 98.8 F (37.1 C) (Oral)  Resp 16  SpO2 100%  LMP 03/22/2014 General:  Alert and oriented.  In no distress. Eye:  Lids and conjunctivas normal.  PERRL,  Full EOMs.  Fundi benign with normal discs and vessels. There was no papilledema.  ENT:  No cranial or facial tenderness to palpation.  TMs and canals clear.  Nasal mucosa was normal and uncongested without any drainage. No intra oral lesions, pharynx clear, mucous membranes moist, dentition normal. Neck:  Supple, full ROM, no tenderness to palpation.  No adenopathy or mass. Neuro:  Alert and orented times 3.  Speech was clear, fluent, and appropriate.  Cranial nerves intact. No pronator drift, muscle strength normal. Finger to nose normal.  DTRs were 2+ and symmetrical.Station and gait were normal.  Romberg's sign was normal.  Able to perform tandem gait well. Psych:  Normal affect.   Course in Urgent Care Center   The following medications were given:  Medications  ketorolac (TORADOL) injection 60 mg (60 mg Intramuscular Given 05/17/14 2152)  metoCLOPramide (REGLAN) injection 10 mg (10 mg Intramuscular Given 05/17/14 2154)  methylPREDNISolone acetate (DEPO-MEDROL) injection 80 mg (80 mg Intramuscular Given 05/17/14 2153)   Assessment   The encounter diagnosis was Migraine without aura and without status migrainosus, not intractable.  There is no evidence of subarachnoid hemorrhage, meningitis, encephalitis, subdural hematoma, epidural hematoma, acute glaucoma, carotid dissection, temporal arteritis, cavernous sinus thrombosis, carbon monoxide toxicity, or pseudotumor cerebri.  Plan   1.  Meds:  The following meds were prescribed:   Discharge Medication List as of 05/17/2014  9:36 PM    START taking these medications   Details  predniSONE (DELTASONE) 20 MG tablet Take 1 tablet (20 mg total) by  mouth 2 (two) times daily., Starting 05/17/2014, Until Discontinued, Print    SUMAtriptan (IMITREX) 50 MG tablet Take 1 tablet (50 mg total) by mouth every 2 (two) hours as needed for migraine or headache. May repeat in 2 hours if headache persists or recurs., Starting 05/17/2014, Until Discontinued, Print         2.  Patient Education/Counseling:  The patient was given appropriate handouts, self care instructions, and instructed in pain control.    3.  Follow up:  The patient was told to follow up here if no better in 2 to 3 days, or sooner if becoming worse in any way, and given some red flag symptoms such as fever, worsening pain, persistent vomiting, or new neurological symptoms which would prompt immediate return.  Follow-up with Dr. Clarisse Gouge if headaches are persistent.     Reuben Likes, MD 05/17/14 2206

## 2014-05-17 NOTE — ED Notes (Signed)
C/o headache x 2 days. Pain in top of her head.  No nausea or vomiting.  No cold symptoms. Took Ibuprofen and Excedrin without relief.

## 2014-05-17 NOTE — Discharge Instructions (Signed)
Cefalea migrañosa °(Migraine Headache) °Una cefalea migrañosa es un dolor intenso y punzante en uno o ambos lados de la cabeza. La migraña puede durar desde 30 minutos hasta varias horas. °CAUSAS  °No siempre se conoce la causa exacta de la cefalea migrañosa. Sin embargo, pueden aparecer cuando los nervios del cerebro se irritan y liberan ciertas sustancias químicas que causan inflamación. Esto ocasiona dolor. °Existen también ciertos factores que pueden desencadenar las migrañas, como los siguientes: °· Alcohol. °· Fumar. °· Estrés. °· La menstruación °· Quesos añejados. °· Los alimentos o las bebidas que contienen nitratos, glutamato, aspartamo o tiramina. °· Falta de sueño. °· Chocolate. °· Cafeína. °· Hambre. °· Actividad física extenuante. °· Fatiga. °· Medicamentos que se usan para tratar el dolor en el pecho (nitroglicerina), píldoras anticonceptivas, estrógeno y algunos medicamentos para la hipertensión arterial. °SIGNOS Y SÍNTOMAS °· Dolor en uno o ambos lados de la cabeza. °· Dolor pulsante o punzante. °· Dolor intenso que impide realizar las actividades diarias. °· Dolor que se agrava por cualquier actividad física. °· Náuseas, vómitos o ambos. °· Mareos. °· Dolor con la exposición a las luces brillantes, a los ruidos fuertes o la actividad. °· Sensibilidad general a las luces brillantes, a los ruidos fuertes o a los olores. °Antes de sufrir una migraña, puede recibir señales de advertencia (aura). Un aura puede incluir: °· Ver las luces intermitentes. °· Ver puntos brillantes, halos o líneas en zigzag. °· Tener una visión en túnel o visión borrosa. °· Sensación de entumecimiento u hormigueo. °· Dificultad para hablar °· Debilidad muscular. °DIAGNÓSTICO  °La cefalea migrañosa se diagnostica en función de lo siguiente: °· Síntomas. °· Examen físico. °· Una TC (tomografía computada) o resonancia magnética de la cabeza. Estas pruebas de diagnóstico por imagen no pueden diagnosticar las migrañas, pero pueden  ayudar a descartar otras causas de las cefaleas. °TRATAMIENTO °Le prescribirán medicamentos para aliviar el dolor y las náuseas. También podrán administrarse medicamentos para ayudar a prevenir las migrañas recurrentes.  °INSTRUCCIONES PARA EL CUIDADO EN EL HOGAR °· Sólo tome medicamentos de venta libre o recetados para calmar el dolor o el malestar, según las indicaciones de su médico. No se recomienda usar los opiáceos a largo plazo. °· Cuando tenga la migraña, acuéstese en un cuarto oscuro y tranquilo °· Lleve un registro diario para averiguar lo que puede provocar las cefaleas migrañosas. Por ejemplo, escriba: °¨ Lo que usted come y bebe. °¨ Cuánto tiempo duerme. °¨ Algún cambio en su dieta o en los medicamentos. °· Limite el consumo de bebidas alcohólicas. °· Si fuma, deje de hacerlo. °· Duerma entre 7 y 9 horas, o según las recomendaciones del médico. °· Limite el estrés. °· Mantenga las luces tenues si le molestan las luces brillantes y la migraña empeora. °SOLICITE ATENCIÓN MÉDICA DE INMEDIATO SI:  °· La migraña se hace cada vez más intensa. °· Tiene fiebre. °· Presenta rigidez en el cuello. °· Tiene pérdida de visión. °· Presenta debilidad muscular o pérdida del control muscular. °· Comienza a perder el equilibrio o tiene problemas para caminar. °· Sufre mareos o se desmaya. °· Tiene síntomas graves que son diferentes a los primeros síntomas. °ASEGÚRESE DE QUE:  °· Comprende estas instrucciones. °· Controlará su afección. °· Recibirá ayuda de inmediato si no mejora o si empeora. °Document Released: 03/07/2005 Document Revised: 12/26/2012 °Exit[employer]® Patient Information ©2015 [employer]. This information is not intended to replace advice given to you by your health care provider. Make sure you discuss any questions you have with your   health care provider. ° ° °

## 2014-05-19 ENCOUNTER — Encounter (HOSPITAL_COMMUNITY): Payer: Self-pay

## 2014-05-19 ENCOUNTER — Emergency Department (INDEPENDENT_AMBULATORY_CARE_PROVIDER_SITE_OTHER)
Admission: EM | Admit: 2014-05-19 | Discharge: 2014-05-19 | Disposition: A | Discharge status: Home / Self Care | Payer: Self-pay | Source: Home / Self Care | Attending: Family Medicine | Admitting: Family Medicine

## 2014-05-19 DIAGNOSIS — G44209 Tension-type headache, unspecified, not intractable: Secondary | ICD-10-CM

## 2014-05-19 HISTORY — DX: Headache: R51

## 2014-05-19 HISTORY — DX: Headache, unspecified: R51.9

## 2014-05-19 MED ORDER — TRAZODONE HCL 50 MG PO TABS: 50.0000 mg | ORAL_TABLET | Freq: Every day | ORAL | Status: DC

## 2014-05-19 NOTE — ED Provider Notes (Signed)
CSN: [id number]     Arrival date & time 05/19/14  1609 History   First MD Initiated Contact with Patient 05/19/14 1840     Chief Complaint  Patient presents with  . Headache   (Consider location/radiation/quality/duration/timing/severity/associated sxs/prior Treatment) HPI  Past Medical History  Diagnosis Date  . Headache    History reviewed. No pertinent past surgical history. History reviewed. No pertinent family history. History  Substance Use Topics  . Smoking status: Never Smoker   . Smokeless tobacco: Not on file  . Alcohol Use: No   OB History    No data available     Review of Systems  Allergies  Review of patient's allergies indicates no known allergies.  Home Medications   Prior to Admission medications   Medication Sig Start Date End Date Taking? Authorizing Provider  acetic acid (VOSOL) 2 % otic solution Place 4 drops into both ears 4 (four) times daily. 04/20/14   Sherren MochaEva N Shaw, MD  aspirin-acetaminophen-caffeine (EXCEDRIN MIGRAINE) [phone] MG per tablet Take 2 tablets by mouth every 6 (six) hours as needed for headache.    Historical Provider, MD  FLUoxetine (PROZAC) 20 MG tablet Take 1 tablet (20 mg total) by mouth daily. 04/21/14   Reuben Likesavid C Keller, MD  ibuprofen (ADVIL,MOTRIN) 400 MG tablet Take 400 mg by mouth every 6 (six) hours as needed for headache.    Historical Provider, MD  LORazepam (ATIVAN) 1 MG tablet Take 1 tablet (1 mg total) by mouth 3 (three) times daily as needed for anxiety. 04/21/14   Reuben Likesavid C Keller, MD  LORazepam (ATIVAN) 1 MG tablet Take 1 tablet (1 mg total) by mouth 3 (three) times daily as needed for anxiety. 04/21/14   Reuben Likesavid C Keller, MD  meclizine (ANTIVERT) 25 MG tablet Take 1 tablet (25 mg total) by mouth 3 (three) times daily as needed for dizziness. 04/20/14   Sherren MochaEva N Shaw, MD  predniSONE (DELTASONE) 20 MG tablet Take 1 tablet (20 mg total) by mouth 2 (two) times daily. 05/17/14   Reuben Likesavid C Keller, MD  pseudoephedrine (SUDAFED 12 HOUR) 120 MG  12 hr tablet Take 1 tablet (120 mg total) by mouth daily. 04/20/14   Sherren MochaEva N Shaw, MD  SUMAtriptan (IMITREX) 50 MG tablet Take 1 tablet (50 mg total) by mouth every 2 (two) hours as needed for migraine or headache. May repeat in 2 hours if headache persists or recurs. 05/17/14   Reuben Likesavid C Keller, MD  traZODone (DESYREL) 50 MG tablet Take 1 tablet (50 mg total) by mouth at bedtime. 05/19/14   Linna HoffJames D Kostantinos Tallman, MD   BP 150/96 mmHg  Pulse 116  Temp(Src) 99 F (37.2 C) (Oral)  Resp 18  SpO2 97%  LMP 03/22/2014 Physical Exam  ED Course  Procedures (including critical care time) Labs Review Labs Reviewed - No data to display  Imaging Review No results found.   MDM   1. Emotional tension headache        Linna HoffJames D Djuna Frechette, MD 05/19/14 [phone]

## 2014-05-19 NOTE — ED Notes (Signed)
States she has had no relief w Rx  [facility]

## 2014-05-20 ENCOUNTER — Emergency Department (HOSPITAL_COMMUNITY): Payer: Self-pay

## 2014-05-20 ENCOUNTER — Encounter (HOSPITAL_COMMUNITY): Payer: Self-pay | Admitting: Emergency Medicine

## 2014-05-20 ENCOUNTER — Emergency Department (HOSPITAL_COMMUNITY)
Admission: EM | Admit: 2014-05-20 | Discharge: 2014-05-20 | Disposition: A | Discharge status: Home / Self Care | Payer: Self-pay | Attending: Emergency Medicine | Admitting: Emergency Medicine

## 2014-05-20 DIAGNOSIS — R519 Headache, unspecified: Secondary | ICD-10-CM

## 2014-05-20 DIAGNOSIS — Z7952 Long term (current) use of systemic steroids: Secondary | ICD-10-CM | POA: Insufficient documentation

## 2014-05-20 DIAGNOSIS — Z3202 Encounter for pregnancy test, result negative: Secondary | ICD-10-CM | POA: Insufficient documentation

## 2014-05-20 DIAGNOSIS — R51 Headache: Secondary | ICD-10-CM | POA: Insufficient documentation

## 2014-05-20 DIAGNOSIS — Z79899 Other long term (current) drug therapy: Secondary | ICD-10-CM | POA: Insufficient documentation

## 2014-05-20 LAB — URINALYSIS, ROUTINE W REFLEX MICROSCOPIC
BILIRUBIN URINE: NEGATIVE
GLUCOSE, UA: NEGATIVE mg/dL
Ketones, ur: 15 mg/dL — AB
Nitrite: NEGATIVE
PH: 6 (ref 5.0–8.0)
Protein, ur: 30 mg/dL — AB
Specific Gravity, Urine: 1.03 (ref 1.005–1.030)
Urobilinogen, UA: 0.2 mg/dL (ref 0.0–1.0)

## 2014-05-20 LAB — URINE MICROSCOPIC-ADD ON

## 2014-05-20 LAB — POC URINE PREG, ED: Preg Test, Ur: NEGATIVE

## 2014-05-20 MED ORDER — DEXAMETHASONE SODIUM PHOSPHATE 10 MG/ML IJ SOLN
10.0000 mg | Freq: Once | INTRAMUSCULAR | Status: AC
  Administered 2014-05-20: 10 mg via INTRAVENOUS
  Filled 2014-05-20: qty 1

## 2014-05-20 MED ORDER — METOCLOPRAMIDE HCL 5 MG/ML IJ SOLN
10.0000 mg | Freq: Once | INTRAMUSCULAR | Status: AC
  Administered 2014-05-20: 10 mg via INTRAVENOUS
  Filled 2014-05-20: qty 2

## 2014-05-20 MED ORDER — KETOROLAC TROMETHAMINE 30 MG/ML IJ SOLN
30.0000 mg | Freq: Once | INTRAMUSCULAR | Status: AC
  Administered 2014-05-20: 30 mg via INTRAVENOUS
  Filled 2014-05-20: qty 1

## 2014-05-20 MED ORDER — TRAMADOL HCL 50 MG PO TABS: 50.0000 mg | ORAL_TABLET | Freq: Four times a day (QID) | ORAL | Status: DC | PRN

## 2014-05-20 MED ORDER — DIPHENHYDRAMINE HCL 50 MG/ML IJ SOLN
25.0000 mg | Freq: Once | INTRAMUSCULAR | Status: AC
Start: 2014-05-20 — End: 2014-05-20
  Administered 2014-05-20: 25 mg via INTRAVENOUS
  Filled 2014-05-20: qty 1

## 2014-05-20 MED ORDER — SODIUM CHLORIDE 0.9 % IV BOLUS (SEPSIS)
1000.0000 mL | Freq: Once | INTRAVENOUS | Status: AC
  Administered 2014-05-20: 1000 mL via INTRAVENOUS

## 2014-05-20 NOTE — ED Notes (Signed)
Pt reports that she has had a headache since last Thursday. Pt was evaluated at urgent care x 2 and given medication for headache. Pt reports that it continues to get worse and she now has numbness on the left side of her face. Pt alert x 4. NAD at this time.

## 2014-05-20 NOTE — ED Notes (Signed)
Interpreter phone used for all communication with pt throughout visit.

## 2014-05-20 NOTE — Discharge Instructions (Signed)
Cefalea migrañosa °(Migraine Headache) °Una cefalea migrañosa es un dolor intenso y punzante en uno o ambos lados de la cabeza. La migraña puede durar desde 30 minutos hasta varias horas. °CAUSAS  °No siempre se conoce la causa exacta de la cefalea migrañosa. Sin embargo, pueden aparecer cuando los nervios del cerebro se irritan y liberan ciertas sustancias químicas que causan inflamación. Esto ocasiona dolor. °Existen también ciertos factores que pueden desencadenar las migrañas, como los siguientes: °· Alcohol. °· Fumar. °· Estrés. °· La menstruación °· Quesos añejados. °· Los alimentos o las bebidas que contienen nitratos, glutamato, aspartamo o tiramina. °· Falta de sueño. °· Chocolate. °· Cafeína. °· Hambre. °· Actividad física extenuante. °· Fatiga. °· Medicamentos que se usan para tratar el dolor en el pecho (nitroglicerina), píldoras anticonceptivas, estrógeno y algunos medicamentos para la hipertensión arterial. °SIGNOS Y SÍNTOMAS °· Dolor en uno o ambos lados de la cabeza. °· Dolor pulsante o punzante. °· Dolor intenso que impide realizar las actividades diarias. °· Dolor que se agrava por cualquier actividad física. °· Náuseas, vómitos o ambos. °· Mareos. °· Dolor con la exposición a las luces brillantes, a los ruidos fuertes o la actividad. °· Sensibilidad general a las luces brillantes, a los ruidos fuertes o a los olores. °Antes de sufrir una migraña, puede recibir señales de advertencia (aura). Un aura puede incluir: °· Ver las luces intermitentes. °· Ver puntos brillantes, halos o líneas en zigzag. °· Tener una visión en túnel o visión borrosa. °· Sensación de entumecimiento u hormigueo. °· Dificultad para hablar °· Debilidad muscular. °DIAGNÓSTICO  °La cefalea migrañosa se diagnostica en función de lo siguiente: °· Síntomas. °· Examen físico. °· Una TC (tomografía computada) o resonancia magnética de la cabeza. Estas pruebas de diagnóstico por imagen no pueden diagnosticar las migrañas, pero pueden  ayudar a descartar otras causas de las cefaleas. °TRATAMIENTO °Le prescribirán medicamentos para aliviar el dolor y las náuseas. También podrán administrarse medicamentos para ayudar a prevenir las migrañas recurrentes.  °INSTRUCCIONES PARA EL CUIDADO EN EL HOGAR °· Sólo tome medicamentos de venta libre o recetados para calmar el dolor o el malestar, según las indicaciones de su médico. No se recomienda usar los opiáceos a largo plazo. °· Cuando tenga la migraña, acuéstese en un cuarto oscuro y tranquilo °· Lleve un registro diario para averiguar lo que puede provocar las cefaleas migrañosas. Por ejemplo, escriba: °¨ Lo que usted come y bebe. °¨ Cuánto tiempo duerme. °¨ Algún cambio en su dieta o en los medicamentos. °· Limite el consumo de bebidas alcohólicas. °· Si fuma, deje de hacerlo. °· Duerma entre 7 y 9 horas, o según las recomendaciones del médico. °· Limite el estrés. °· Mantenga las luces tenues si le molestan las luces brillantes y la migraña empeora. °SOLICITE ATENCIÓN MÉDICA DE INMEDIATO SI:  °· La migraña se hace cada vez más intensa. °· Tiene fiebre. °· Presenta rigidez en el cuello. °· Tiene pérdida de visión. °· Presenta debilidad muscular o pérdida del control muscular. °· Comienza a perder el equilibrio o tiene problemas para caminar. °· Sufre mareos o se desmaya. °· Tiene síntomas graves que son diferentes a los primeros síntomas. °ASEGÚRESE DE QUE:  °· Comprende estas instrucciones. °· Controlará su afección. °· Recibirá ayuda de inmediato si no mejora o si empeora. °Document Released: 03/07/2005 Document Revised: 12/26/2012 °Exit[employer]® Patient Information ©2015 [employer]. This information is not intended to replace advice given to you by your health care provider. Make sure you discuss any questions you have with your   health care provider. ° ° °

## 2014-05-20 NOTE — ED Provider Notes (Signed)
CSN: [id number]     Arrival date & time 05/20/14  0750 History   First MD Initiated Contact with Patient 05/20/14 [phone]     Chief Complaint  Patient presents with  . Headache     (Consider location/radiation/quality/duration/timing/severity/associated sxs/prior Treatment) HPI Comments: Patient is a 34 year old Hispanic female who presents for evaluation of headache. She reports her pain is in the left parietal region. This began in the absence of any injury or trauma. She denies any fevers or chills. She does report having blurry vision when the pain is at its worst. She is also reporting numbness to her left face. She denies any stiff neck. She was seen at urgent care twice in the past 3 days for similar complaints. She was prescribed Imitrex, however this is not helping.  Patient does not speak [place], only [place]. The history was obtained with the help of the translator line.  Patient is a 34 y.o. female presenting with headaches. The history is provided by the patient.  Headache Pain location:  L parietal Quality:  Dull Radiates to:  Does not radiate Onset quality:  Sudden Duration:  5 days Timing:  Constant Progression:  Worsening Chronicity:  New Similar to prior headaches: no   Context: not activity and not exposure to bright light   Relieved by:  Nothing Worsened by:  Nothing Ineffective treatments:  Prescription medications   Past Medical History  Diagnosis Date  . Headache    History reviewed. No pertinent past surgical history. No family history on file. History  Substance Use Topics  . Smoking status: Never Smoker   . Smokeless tobacco: Not on file  . Alcohol Use: No   OB History    No data available     Review of Systems  Neurological: Positive for headaches.  All other systems reviewed and are negative.     Allergies  Review of patient's allergies indicates no known allergies.  Home Medications   Prior to Admission medications   Medication Sig  Start Date End Date Taking? Authorizing Provider  ibuprofen (ADVIL,MOTRIN) 400 MG tablet Take 400 mg by mouth every 6 (six) hours as needed for headache.   Yes Historical Provider, MD  predniSONE (DELTASONE) 20 MG tablet Take 1 tablet (20 mg total) by mouth 2 (two) times daily. 05/17/14  Yes Reuben Likesavid C Keller, MD  SUMAtriptan (IMITREX) 50 MG tablet Take 1 tablet (50 mg total) by mouth every 2 (two) hours as needed for migraine or headache. May repeat in 2 hours if headache persists or recurs. 05/17/14  Yes Reuben Likesavid C Keller, MD  traZODone (DESYREL) 50 MG tablet Take 1 tablet (50 mg total) by mouth at bedtime. 05/19/14  Yes Linna HoffJames D Kindl, MD  acetic acid (VOSOL) 2 % otic solution Place 4 drops into both ears 4 (four) times daily. Patient not taking: Reported on 05/20/2014 04/20/14   Sherren MochaEva N Shaw, MD  aspirin-acetaminophen-caffeine [facility](EXCEDRIN MIGRAINE) [phone] MG per tablet Take 2 tablets by mouth every 6 (six) hours as needed for headache.    Historical Provider, MD  FLUoxetine (PROZAC) 20 MG tablet Take 1 tablet (20 mg total) by mouth daily. Patient not taking: Reported on 05/20/2014 04/21/14   Reuben Likesavid C Keller, MD  LORazepam (ATIVAN) 1 MG tablet Take 1 tablet (1 mg total) by mouth 3 (three) times daily as needed for anxiety. Patient not taking: Reported on 05/20/2014 04/21/14   Reuben Likesavid C Keller, MD  LORazepam (ATIVAN) 1 MG tablet Take 1 tablet (1 mg total) by mouth  3 (three) times daily as needed for anxiety. Patient not taking: Reported on 05/20/2014 04/21/14   Reuben Likes, MD  meclizine (ANTIVERT) 25 MG tablet Take 1 tablet (25 mg total) by mouth 3 (three) times daily as needed for dizziness. Patient not taking: Reported on 05/20/2014 04/20/14   Sherren Mocha, MD  pseudoephedrine (SUDAFED 12 HOUR) 120 MG 12 hr tablet Take 1 tablet (120 mg total) by mouth daily. Patient not taking: Reported on 05/20/2014 04/20/14   Sherren Mocha, MD   BP 125/71 mmHg  Pulse 107  Temp(Src) 98.5 F (36.9 C)  Resp 18  SpO2 98%  LMP  03/22/2014 Physical Exam  Constitutional: She is oriented to person, place, and time. She appears well-developed and well-nourished. No distress.  HENT:  Head: Normocephalic and atraumatic.  Eyes: EOM are normal. Pupils are equal, round, and reactive to light.  There is no papilledema on fundoscopic exam.  Neck: Normal range of motion. Neck supple.  Cardiovascular: Normal rate and regular rhythm.  Exam reveals no gallop and no friction rub.   No murmur heard. Pulmonary/Chest: Effort normal and breath sounds normal. No respiratory distress. She has no wheezes.  Abdominal: Soft. Bowel sounds are normal. She exhibits no distension. There is no tenderness.  Musculoskeletal: Normal range of motion.  Neurological: She is alert and oriented to person, place, and time. No cranial nerve deficit. She exhibits normal muscle tone. Coordination normal.  Skin: Skin is warm and dry. She is not diaphoretic.  Nursing note and vitals reviewed.   ED Course  Procedures (including critical care time) Labs Review Labs Reviewed  URINALYSIS, ROUTINE W REFLEX MICROSCOPIC  PREGNANCY, URINE    Imaging Review No results found.   EKG Interpretation None      MDM   Final diagnoses:  None    CT scan of the head is unremarkable and the patient is feeling better with medications given in the ER. She is neurologically intact. Nothing appears emergent. She will be prescribed tramadol she can take in addition to her previously prescribed medications and is to return as needed if she develops a worsening of her condition.    Geoffery Lyons, MD 05/20/14 1005

## 2014-05-20 NOTE — ED Notes (Signed)
Patient transported to CT 

## 2014-05-20 NOTE — ED Notes (Signed)
MD at bedside. 

## 2014-05-27 ENCOUNTER — Ambulatory Visit: Payer: Self-pay | Admitting: Internal Medicine

## 2014-06-12 ENCOUNTER — Encounter: Payer: Self-pay | Admitting: Internal Medicine

## 2014-06-12 ENCOUNTER — Ambulatory Visit: Payer: Self-pay | Attending: Internal Medicine | Admitting: Internal Medicine

## 2014-06-12 VITALS — BP 118/80 | HR 104 | Temp 98.2°F | Resp 16 | Ht 64.0 in | Wt 149.0 lb

## 2014-06-12 DIAGNOSIS — F329 Major depressive disorder, single episode, unspecified: Secondary | ICD-10-CM | POA: Insufficient documentation

## 2014-06-12 DIAGNOSIS — F419 Anxiety disorder, unspecified: Secondary | ICD-10-CM | POA: Insufficient documentation

## 2014-06-12 DIAGNOSIS — F32A Depression, unspecified: Secondary | ICD-10-CM

## 2014-06-12 DIAGNOSIS — Z0000 Encounter for general adult medical examination without abnormal findings: Secondary | ICD-10-CM | POA: Insufficient documentation

## 2014-06-12 DIAGNOSIS — F411 Generalized anxiety disorder: Secondary | ICD-10-CM | POA: Insufficient documentation

## 2014-06-12 DIAGNOSIS — F4310 Post-traumatic stress disorder, unspecified: Secondary | ICD-10-CM | POA: Insufficient documentation

## 2014-06-12 LAB — POCT URINALYSIS DIPSTICK
Bilirubin, UA: NEGATIVE
GLUCOSE UA: NEGATIVE
Ketones, UA: NEGATIVE
Leukocytes, UA: NEGATIVE
NITRITE UA: NEGATIVE
PH UA: 7
PROTEIN UA: NEGATIVE
SPEC GRAV UA: 1.02
Urobilinogen, UA: 0.2

## 2014-06-12 MED ORDER — HYDROXYZINE HCL 10 MG PO TABS: 10.0000 mg | ORAL_TABLET | Freq: Three times a day (TID) | ORAL | Status: DC | PRN

## 2014-06-12 MED ORDER — TRAZODONE HCL 50 MG PO TABS: 50.0000 mg | ORAL_TABLET | Freq: Every day | ORAL | Status: DC

## 2014-06-12 MED ORDER — CARBAMIDE PEROXIDE 6.5 % OT SOLN: 5.0000 [drp] | Freq: Two times a day (BID) | OTIC | Status: DC

## 2014-06-12 NOTE — Progress Notes (Signed)
Pt is here today c/o of anxiety and depression.  Pt states that she went to an urgent care where they gave her some medication and she said that she has not improved.  Pt states that she has itching on the outside of her vagina. Pt has numbness in her hands and feet. 

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
## 2014-06-12 NOTE — Patient Instructions (Signed)
Depresin (Depression) La depresin es un sentimiento de tristeza, decaimiento, sufrimiento espiritual, melancola, pesimismo o vaco. Hay dos tipos de depresin:  Tristeza o afliccin normal. Ocurre despus de un suceso que Publishing rights manager. Generalmente desaparece sin tratamiento dentro de las 2 semanas. Despus de perder un ser querido (duelo), la tristeza o afliccin normales pueden durar ms de [employer]. Generalmente mejora al pasar [employer].  Depresin clnica. Dura ms que la tristeza o afliccin normal. Impide realizar las cosas habituales de la vida. Causa dificultades para actuar en el hogar, el trabajo o la escuela. Puede afectar las relaciones con los dems. A menudo requiere tratamiento. SOLICITE AYUDA DE INMEDIATO SI:  Tiene pensamientos acerca de lastimarse o daar a Economist.  Pierde el contacto con la realidad (sntomas psicticos). Puede ocurrirle que:  Vea o escuche cosas que no existen.  Tenga creencias falsas sobre su vida o sobre las personas que lo rodean.  Los [employer] produzcan trastornos. ASEGRESE DE QUE:  Comprende estas instrucciones.  Controlar su afeccin.  Recibir ayuda de inmediato si no mejora o si empeora. Document Released: 06/22/2010 Document Revised: 07/22/2013 [facility] Patient Information 2015 [place], [place]. This information is not intended to replace advice given to you by your health care provider. Make sure you discuss any questions you have with your health care provider. Trastorno de ansiedad generalizada (Generalized Anxiety Disorder) El trastorno de ansiedad generalizada es un trastorno mental. Interfiere en las funciones vitales, incluyendo las [place], el trabajo y la escuela.  Es diferente de la ansiedad normal que todas las personas experimentan en algn momento de su vida en respuesta a sucesos y Chief Operating Officer. En verdad, la ansiedad normal nos ayuda a prepararnos y Human resources officer acontecimientos y  actividades de la vida. La ansiedad normal desaparece despus de que el evento o la actividad ha finalizado.  El trastorno de ansiedad generalizada no est necesariamente relacionada con eventos o actividades especficas. Tambin causa un exceso de ansiedad en proporcin a sucesos o actividades especficas. En este trastorno la ansiedad es difcil de Chief Operating Officer. Los sntomas pueden variar de leves a muy graves. Las personas que sufren de trastorno de ansiedad generalizada pueden tener intensas olas de ansiedad con sntomas fsicos (ataques de pnico).  SNTOMAS  La ansiedad y la preocupacin asociada a este trastorno son difciles de Chief Operating Officer. Esta ansiedad y la preocupacin estn relacionados con muchos eventos de la vida y sus actividades y tambin ocurre durante ms [employer] que no ocurre, durante 6 meses o ms. Las personas que la sufren pueden tener tres o ms de los siguientes sntomas (uno o ms en los nios):   Glass blower/designer.  Dificultades de concentracin.   Irritabilidad.  Tensin muscular  Dificultad para dormirse o sueo poco satisfactorio. DIAGNSTICO  Se diagnostica a travs de una evaluacin realizada por el mdico. El mdico le har preguntas acerca de su estado de nimo, sntomas fsicos y sucesos de [place] vida. Le har preguntas sobre su historia clnica, el consumo de alcohol o drogas, incluyendo los medicamentos recetados. [employer] un examen fsico e indicar anlisis de [place]. Ciertas enfermedades y el uso de determinadas sustancias pueden causar sntomas similares a este trastorno. Su mdico lo puede derivar a Music therapist en salud mental para una evaluacin ms profunda.Gerlean Ren  Las terapias siguientes se utilizan en el tratamiento de este trastorno:   Medicamentos - Se recetan antidepresivos para el control diario a Air cabin crew. Pueden indicarse tambin medicamentos para combatir la ansiedad  en los casos graves, especialmente cuando ocurren  ataques de pnico.   Terapia conversada (psicoterapia) Ciertos tipos de psicoterapia pueden ser tiles en el tratamiento del trastorno de ansiedad generalizada, proporcionando apoyo, educacin y Optometristorientacin. Una forma de psicoterapia llamada terapia cognitivo-conductual puede ensearle formas saludables de pensar y Publishing rights managerreaccionar a los eventos y actividades de la vida diaria.  Tcnicasde manejo del estrs- Estas tcnicas incluyen el yoga, la meditacin y el ejercicio y pueden ser muy tiles cuando se practican con regularidad. Un especialista en salud mental puede ayudar a determinar qu tratamiento es mejor para usted. Algunas personas obtienen mejora con una terapia. Sin embargo, Economistotras personas requieren una combinacin de terapias.  Document Released: 07/02/2012 Document Revised: 07/22/2013 [facility] Patient Information 2015 [place], [place]. This information is not intended to replace advice given to you by your health care provider. Make sure you discuss any questions you have with your health care provider.

## 2014-07-10 ENCOUNTER — Encounter: Payer: Self-pay | Admitting: Internal Medicine

## 2014-07-10 ENCOUNTER — Ambulatory Visit: Payer: Self-pay | Attending: Internal Medicine | Admitting: Internal Medicine

## 2014-07-10 VITALS — BP 125/81 | HR 108 | Temp 98.6°F | Ht 63.0 in | Wt 145.0 lb

## 2014-07-10 DIAGNOSIS — F32A Depression, unspecified: Secondary | ICD-10-CM

## 2014-07-10 DIAGNOSIS — K029 Dental caries, unspecified: Secondary | ICD-10-CM

## 2014-07-10 DIAGNOSIS — F329 Major depressive disorder, single episode, unspecified: Secondary | ICD-10-CM

## 2014-07-10 DIAGNOSIS — F411 Generalized anxiety disorder: Secondary | ICD-10-CM

## 2014-07-10 MED ORDER — PAROXETINE HCL 10 MG PO TABS: 10.0000 mg | ORAL_TABLET | Freq: Every day | ORAL | Status: DC

## 2014-07-10 NOTE — Patient Instructions (Signed)
Depresin (Depression) La depresin es un sentimiento de tristeza, decaimiento, sufrimiento espiritual, melancola, pesimismo o vaco. En general, hay dos tipos de depresin: 1. Tristeza o afliccin normal. Todos tenemos este tipo de depresin de vez en cuando, despus de atravesar alguna experiencia triste, como la prdida de un trabajo o el final de una relacin. Este tipo de depresin se considera normal, es de corta duracin y se resuelve en cuestin de unos pocos das a 2 semanas. La depresin correspondiente a la prdida de un ser querido (duelo) a menudo dura ms que 2semanas, pero normalmente mejora con el tiempo. 2. Depresin clnica. Este tipo de depresin dura ms que la tristeza o afliccin normal, o interfiere en su capacidad de funcionar en el hogar, en el trabajo y en la escuela. Tambin interfiere en las relaciones personales. Afecta casi todos los aspectos de la vida. La depresin clnica es una enfermedad. Los sntomas de la depresin tambin pueden tener causas diferentes a las mencionadas arriba, por ejemplo:  Enfermedades fsicas. Algunas enfermedades fsicas, como hipotiroidismo, anemia grave, tipos especficos de cncer, diabetes, convulsiones incontroladas, problemas cardacos y pulmonares, ictus y dolor crnico, se asocian con frecuencia a los sntomas de la depresin.  Efectos secundarios de algunos medicamentos recetados. En algunas personas, determinados tipos de medicamentos pueden causar sntomas de depresin.  Consumo de drogas. El consumo de alcohol y drogas puede causar sntomas de depresin. SNTOMAS Los sntomas de tristeza y duelo normal son:  Sentirse triste o llorar durante perodos cortos de tiempo.  Falta de preocupacin por todo (apata).  Dificultad para dormir o dormir demasiado.  No poder disfrutar de las cosas que antes disfrutaba.  El deseo de estar solo todo el tiempo (aislamiento social).  Falta de energa o motivacin.  Dificultad para  concentrarse o recordar.  Cambios en el apetito o en el peso.  Inquietud o agitacin. Los sntomas de la depresin clnica son los mismos de la tristeza o duelo normal y tambin incluyen los siguientes sntomas:  Sentirse triste o llorar todo el tiempo.  Sentimientos de culpa o inutilidad.  Sentimientos de desesperanza o desamparo.  Pensamientos de suicidio o el deseo de daarse a s mismo (ideas suicidas).  Prdida de contacto con la realidad (sntomas psicticos). Ver o escuchar cosas que no son reales (alucinaciones) o tener creencias falsas acerca de su vida o de las personas que lo rodean (delirios y paranoia). DIAGNSTICO El diagnstico de depresin clnica por lo general est basado en la gravedad y la duracin de los sntomas. El mdico le har preguntas sobre su historia clnica y el uso de sustancias, para determinar si la causa de la depresin es una enfermedad fsica, el uso de medicamentos recetados o el abuso de sustancias. El mdico tambin podr indicarle anlisis de sangre. TRATAMIENTO Por lo general, la tristeza y la afliccin normal no requieren tratamiento. Pero a veces se indican antidepresivos durante el duelo para aliviar los sntomas de depresin hasta que se resuelvan. El tratamiento de la depresin clnica depende de la gravedad de los sntomas, pero suele incluir antidepresivos, psicoterapia con un profesional de la salud mental o una combinacin de ambos. El mdico lo ayudar a determinar el mejor tratamiento para usted. La depresin causada por una enfermedad fsica generalmente desaparece con tratamiento mdico adecuado de la enfermedad. Si un medicamento recetado le causa depresin, consulte al mdico si debe suspenderlo, disminuir la dosis o sustituirlo por otro medicamento. La depresin causada por el abuso de alcohol o de drogas desaparece al dejar de usar   estas sustancias. Algunos adultos necesitan ayuda profesional para dejar de beber o de consumir  drogas. SOLICITE ATENCIN MDICA DE INMEDIATO SI:  Tiene pensamientos acerca de lastimarse o daar a otras personas.  Pierde el contacto con la realidad (tiene sntomas psicticos).  Est tomando medicamentos para la depresin y tiene efectos secundarios graves. PARA OBTENER MS INFORMACIN  National Alliance on Mental Illness: www.nami.org  National Institute of Mental Health: www.nimh.nih.gov Document Released: 03/07/2005 Document Revised: 07/22/2013 ExitCare Patient Information 2015 ExitCare, LLC. This information is not intended to replace advice given to you by your health care provider. Make sure you discuss any questions you have with your health care provider.  

## 2014-07-10 NOTE — Progress Notes (Signed)
Patient ID: Tracie [name], female   DOB: 11-03-1980, 35 y.o.   MRN: [id number]   Tracie [name], is a 34 y.o. female  [id number]  [id number]  DOB - 05/21/1980  Chief Complaint  Patient presents with  . Follow-up  . Anxiety  . Depression        Subjective:   Tracie [name] is a 34 y.o. female here today for a follow up visit. Patient has history of PTSD and generalized anxiety disorder. Patient says she is doing much better shin since starting trazodone and hydroxyzine, she has seen therapist and she is gradually working with her. She is asking if she can start Paxil instead of hydroxyzine because she has a family member takes Paxil and it works for them. Patient has no new complaints today. She claims she is much better than the last time she was here 4 weeks ago. She denies any suicidal ideation or thoughts. Patient has No headache, No chest pain, No abdominal pain - No Nausea, No new weakness tingling or numbness, No Cough - SOB.  No problems updated.  ALLERGIES: No Known Allergies  PAST MEDICAL HISTORY: Past Medical History  Diagnosis Date  . Headache     MEDICATIONS AT HOME: Prior to Admission medications   Medication Sig Start Date End Date Taking? Authorizing Provider  carbamide peroxide (DEBROX) 6.5 % otic solution Place 5 drops into both ears 2 (two) times daily. 06/12/14  Yes Quentin Angst, MD  hydrOXYzine (ATARAX/VISTARIL) 10 MG tablet Take 1 tablet (10 mg total) by mouth 3 (three) times daily as needed. 06/12/14  Yes Shereta Crothers Annitta Needs, MD  acetic acid (VOSOL) 2 % otic solution Place 4 drops into both ears 4 (four) times daily. Patient not taking: Reported on 05/20/2014 04/20/14   Sherren Mocha, MD  LORazepam (ATIVAN) 1 MG tablet Take 1 tablet (1 mg total) by mouth 3 (three) times daily as needed for anxiety. Patient not taking: Reported on 05/20/2014 04/21/14   Reuben Likes, MD  LORazepam (ATIVAN) 1 MG tablet Take 1 tablet (1 mg total) by  mouth 3 (three) times daily as needed for anxiety. Patient not taking: Reported on 05/20/2014 04/21/14   Reuben Likes, MD  PARoxetine (PAXIL) 10 MG tablet Take 1 tablet (10 mg total) by mouth daily. 07/10/14   Quentin Angst, MD  predniSONE (DELTASONE) 20 MG tablet Take 1 tablet (20 mg total) by mouth 2 (two) times daily. Patient not taking: Reported on 06/12/2014 05/17/14   Reuben Likes, MD  pseudoephedrine (SUDAFED 12 HOUR) 120 MG 12 hr tablet Take 1 tablet (120 mg total) by mouth daily. Patient not taking: Reported on 05/20/2014 04/20/14   Sherren Mocha, MD  SUMAtriptan (IMITREX) 50 MG tablet Take 1 tablet (50 mg total) by mouth every 2 (two) hours as needed for migraine or headache. May repeat in 2 hours if headache persists or recurs. Patient not taking: Reported on 06/12/2014 05/17/14   Reuben Likes, MD  traZODone (DESYREL) 50 MG tablet Take 1 tablet (50 mg total) by mouth at bedtime. 06/12/14   Quentin Angst, MD     Objective:   Filed Vitals:   07/10/14 1418  BP: 125/81  Pulse: 108  Temp: 98.6 F (37 C)  Height:  (1.6 m)  Weight: 145 lb (65.772 kg)  SpO2: 99%    Exam General appearance : Awake, alert, not in any distress. Speech Clear. Not toxic looking HEENT: Atraumatic and Normocephalic, pupils equally reactive to  light and accomodation Neck: supple, no JVD. No cervical lymphadenopathy.  Chest:Good air entry bilaterally, no added sounds  CVS: S1 S2 regular, no murmurs.  Abdomen: Bowel sounds present, Non tender and not distended with no gaurding, rigidity or rebound. Extremities: B/L Lower Ext shows no edema, both legs are warm to touch Neurology: Awake alert, and oriented X 3, CN II-XII intact, Non focal   Data Review Lab Results  Component Value Date   HGBA1C 5.5 09/09/2013     Assessment & Plan   1. Depression (emotion)  - PARoxetine (PAXIL) 10 MG tablet; Take 1 tablet (10 mg total) by mouth daily.  Dispense: 30 tablet; Refill: 3 - Ambulatory  referral to Psychiatry  2. Generalized anxiety disorder  - PARoxetine (PAXIL) 10 MG tablet; Take 1 tablet (10 mg total) by mouth daily.  Dispense: 30 tablet; Refill: 3 - Ambulatory referral to Psychiatry  3. Dental caries  - Ambulatory referral to Dentistry  Patient have been counseled extensively about nutrition and exercise  Interpreter was used to communicate directly with patient for the entire encounter including providing detailed patient instructions.   Return in about 3 months (around 10/09/2014) for Generalized Anxiety Disorder.  The patient was given clear instructions to go to ER or return to medical center if symptoms don't improve, worsen or new problems develop. The patient verbalized understanding. The patient was told to call to get lab results if they haven't heard anything in the next week.   This note has been created with Education officer, environmentalDragon speech recognition software and smart phrase technology. Any transcriptional errors are unintentional.    Jeanann LewandowskyJEGEDE, Kayli Beal, MD, MHA, CPE, FACP, FAAP [facility] [place], [place] [phone]   07/10/2014, 2:54 PM

## 2014-07-10 NOTE — Progress Notes (Signed)
Patient here to follow up on anxiety and depression  She states she is not doing any better since you prescribed the trazadone and hydroxyzine  She is seeing a therapist but reports it is not helping. She is asking if you can prescribe Paxil instead because she has a family member taking it and it has worked for them.

## 2014-07-11 ENCOUNTER — Other Ambulatory Visit: Payer: Self-pay

## 2014-07-14 ENCOUNTER — Other Ambulatory Visit: Payer: Self-pay

## 2014-08-21 ENCOUNTER — Ambulatory Visit: Payer: Self-pay | Attending: Internal Medicine | Admitting: Internal Medicine

## 2014-08-21 ENCOUNTER — Encounter: Payer: Self-pay | Admitting: Internal Medicine

## 2014-08-21 VITALS — BP 120/75 | HR 104 | Temp 98.2°F | Ht 64.0 in | Wt 148.0 lb

## 2014-08-21 DIAGNOSIS — R1031 Right lower quadrant pain: Secondary | ICD-10-CM

## 2014-08-21 DIAGNOSIS — R109 Unspecified abdominal pain: Secondary | ICD-10-CM | POA: Insufficient documentation

## 2014-08-21 DIAGNOSIS — N760 Acute vaginitis: Secondary | ICD-10-CM

## 2014-08-21 LAB — POCT URINALYSIS DIPSTICK
BILIRUBIN UA: NEGATIVE
GLUCOSE UA: NEGATIVE
KETONES UA: NEGATIVE
LEUKOCYTES UA: NEGATIVE
NITRITE UA: NEGATIVE
Protein, UA: NEGATIVE
RBC UA: NEGATIVE
Spec Grav, UA: <=1.005
Urobilinogen, UA: 0.2
pH, UA: 6.5

## 2014-08-21 LAB — POCT URINE PREGNANCY: PREG TEST UR: NEGATIVE

## 2014-08-21 NOTE — Progress Notes (Signed)
Patient complaining of bilateral flank pain for 2 week as well as lower abdominal pain LMP 08/08/14 No report of dysuria Patient also complains of left ear "feeling full" but has only used her debrox twice [facility] and that that coupled with the medications prescribed are helping her symptoms

## 2014-08-21 NOTE — Patient Instructions (Signed)
Dolor abdominal °(Abdominal Pain) °El dolor puede tener muchas causas. Normalmente la causa del dolor abdominal no es una enfermedad y mejorará sin tratamiento. Frecuentemente puede controlarse y tratarse en casa. Su médico le realizará un examen físico y posiblemente solicite análisis de sangre y radiografías para ayudar a determinar la gravedad de su dolor. Sin embargo, en muchos casos, debe transcurrir más tiempo antes de que se pueda encontrar una causa evidente del dolor. Antes de llegar a ese punto, es posible que su médico no sepa si necesita más pruebas o un tratamiento más profundo. °INSTRUCCIONES PARA EL CUIDADO EN EL HOGAR  °Esté atento al dolor para ver si hay cambios. Las siguientes indicaciones ayudarán a aliviar cualquier molestia que pueda sentir: °· Tome solo medicamentos de venta libre o recetados, según las indicaciones del médico. °· No tome laxantes a menos que se lo haya indicado su médico. °· Pruebe con una dieta líquida absoluta (caldo, té o agua) según se lo indique su médico. Introduzca gradualmente una dieta normal, según su tolerancia. °SOLICITE ATENCIÓN MÉDICA SI: °· Tiene dolor abdominal sin explicación. °· Tiene dolor abdominal relacionado con náuseas o diarrea. °· Tiene dolor cuando orina o defeca. °· Experimenta dolor abdominal que lo despierta de noche. °· Tiene dolor abdominal que empeora o mejora cuando come alimentos. °· Tiene dolor abdominal que empeora cuando come alimentos grasosos. °· Tiene fiebre. °SOLICITE ATENCIÓN MÉDICA DE INMEDIATO SI:  °· El dolor no desaparece en un plazo máximo de 2 horas. °· No deja de (vomitar). °· El dolor se siente solo en partes del abdomen, como el lado derecho o la parte inferior izquierda del abdomen. °· Evacúa materia fecal sanguinolenta o negra, de aspecto alquitranado. °ASEGÚRESE DE QUE: °· Comprende estas instrucciones. °· Controlará su afección. °· Recibirá ayuda de inmediato si no mejora o si empeora. °Document Released: 03/07/2005  Document Revised: 03/12/2013 °ExitCare® Patient Information ©2015 [employer], [employer]. This information is not intended to replace advice given to you by your health care provider. Make sure you discuss any questions you have with your health care provider. ° °

## 2014-08-21 NOTE — Progress Notes (Signed)
Patient ID: Tracie [name], female   DOB: 01/17/1981, 34 y.o.   MRN: [id number]   Tracie [name], is a 34 y.o. female  [id number]  [id number]  DOB - [birth year]  Chief Complaint  Patient presents with  . Flank Pain        Subjective:   Tracie [name] is a 34 y.o. female here today for a follow up visit. Patient has history of PTSD and generalized anxiety with much improvement after starting SSRI and trazodone, has scheduled appointment with psychologist next week, here today with complaint of right-sided abdominal pain that started about 2 weeks ago but getting worse. There is associated nausea but no change in bowel habits, no urinary symptoms. Pain is rated about 6 out of 10, constant, radiating to the back. Patient also notices some vaginal discharge but no unusual odor, pregnancy test is negative today. Patient has No headache, No chest pain, No abdommenstrual period was 08/16/2014, normal. She is sexually active with husband. Patient has No headache, No chest pain, No new weakness tingling or numbness, No Cough - SOB.  Problem  Flank Pain    ALLERGIES: No Known Allergies  PAST MEDICAL HISTORY: Past Medical History  Diagnosis Date  . Headache     MEDICATIONS AT HOME: Prior to Admission medications   Medication Sig Start Date End Date Taking? Authorizing Provider  carbamide peroxide (DEBROX) 6.5 % otic solution Place 5 drops into both ears 2 (two) times daily. 06/12/14  Yes Quentin Angst, MD  hydrOXYzine (ATARAX/VISTARIL) 10 MG tablet Take 1 tablet (10 mg total) by mouth 3 (three) times daily as needed. 06/12/14  Yes Quentin Angst, MD  predniSONE (DELTASONE) 20 MG tablet Take 1 tablet (20 mg total) by mouth 2 (two) times daily. 05/17/14  Yes Reuben Likes, MD  PARoxetine (PAXIL) 10 MG tablet Take 1 tablet (10 mg total) by mouth daily. Patient not taking: Reported on 08/21/2014 07/10/14   Quentin Angst, MD  pseudoephedrine (SUDAFED 12 HOUR)  120 MG 12 hr tablet Take 1 tablet (120 mg total) by mouth daily. Patient not taking: Reported on 08/21/2014 04/20/14   Sherren Mocha, MD  SUMAtriptan (IMITREX) 50 MG tablet Take 1 tablet (50 mg total) by mouth every 2 (two) hours as needed for migraine or headache. May repeat in 2 hours if headache persists or recurs. Patient not taking: Reported on 06/12/2014 05/17/14   Reuben Likes, MD  traZODone (DESYREL) 50 MG tablet Take 1 tablet (50 mg total) by mouth at bedtime. Patient not taking: Reported on 08/21/2014 06/12/14   Quentin Angst, MD     Objective:   Filed Vitals:   08/21/14 1533  BP: 120/75  Pulse: 104  Temp: 98.2 F (36.8 C)  Height:  (1.626 m)  Weight: 148 lb (67.132 kg)  SpO2: 100%    Exam General appearance : Awake, alert, not in any distress. Speech Clear. Not toxic looking HEENT: Atraumatic and Normocephalic, pupils equally reactive to light and accomodation Neck: supple, no JVD. No cervical lymphadenopathy.  Chest:Good air entry bilaterally, no added sounds  CVS: S1 S2 regular, no murmurs.  Abdomen: Bowel sounds present, tenderness at McBurney's point, positive psoas sign with no gaurding, rigidity or rebound. Extremities: B/L Lower Ext shows no edema, both legs are warm to touch Neurology: Awake alert, and oriented X 3, CN II-XII intact, Non focal Skin:No Rash  Data Review Lab Results  Component Value Date   HGBA1C 5.5 09/09/2013     Assessment &  Plan   1. Right lower quadrant pain: In view of tenderness at McBurney's point and positive psoas sign, will schedule for CT abdomen to rule out appendicitis  Pregnancy test is negative today - Urinalysis Dipstick negative for infection - CT Abdomen Pelvis W Contrast; Future  2. Vaginitis and vulvovaginitis  - Cervicovaginal ancillary only   Interpreter was used to communicate directly with patient for the entire encounter including providing detailed patient instructions.   Patient have been counseled  extensively about nutrition and exercise Return in about 6 months (around 02/20/2015), or if symptoms worsen or fail to improve, for Routine Follow Up, Abdominal Pain.  The patient was given clear instructions to go to ER or return to medical center if symptoms don't improve, worsen or new problems develop. The patient verbalized understanding. The patient was told to call to get lab results if they haven't heard anything in the next week.   This note has been created with Education officer, environmentalDragon speech recognition software and smart phrase technology. Any transcriptional errors are unintentional.    Jeanann LewandowskyJEGEDE, Simrin Vegh, MD, MHA, CPE, FACP, FAAP [facility] [place] , [place] [phone]   08/21/2014, 3:58 PM

## 2014-08-22 LAB — CERVICOVAGINAL ANCILLARY ONLY
Chlamydia: NEGATIVE
Neisseria Gonorrhea: NEGATIVE

## 2014-08-25 ENCOUNTER — Telehealth: Payer: Self-pay

## 2014-08-25 LAB — CERVICOVAGINAL ANCILLARY ONLY: Wet Prep (BD Affirm): NEGATIVE

## 2014-08-25 NOTE — Telephone Encounter (Signed)
Pt aware of results. Tracie [name]  

## 2014-08-25 NOTE — Telephone Encounter (Signed)
-----   Message from Quentin Angstlugbemiga E Jegede, MD sent at 08/25/2014 12:17 PM EDT ----- Please inform patient that her vaginal swab test is negative for any infection. Pregnancy test is negative.

## 2014-09-05 ENCOUNTER — Telehealth: Payer: Self-pay | Admitting: Internal Medicine

## 2014-09-05 NOTE — Telephone Encounter (Signed)
Pt calling to report ear drops have not been effective. Please f/u with pt.

## 2014-09-08 ENCOUNTER — Other Ambulatory Visit: Payer: Self-pay

## 2014-09-09 ENCOUNTER — Other Ambulatory Visit: Payer: Self-pay

## 2014-09-10 ENCOUNTER — Ambulatory Visit: Payer: Self-pay | Attending: Internal Medicine | Admitting: Clinical

## 2014-09-10 DIAGNOSIS — F411 Generalized anxiety disorder: Secondary | ICD-10-CM

## 2014-09-10 NOTE — Progress Notes (Signed)
ASSESSMENT: Pt currently experiencing anxiety and panic attacks. Pt needs to F/U with PCP and [facility], and continue to take her [facility] medication as directed. Pt would benefit from psychoeducation and supportive counseling regarding coping with symptoms of anxiety and panic attacks; pt would also benefit from community resources.  Stage of Change: action  PLAN: 1. F/U with behavioral health consultant in as needed 2. Psychiatric Medications: Paxil. 3. Behavioral recommendation(s):   -Practice breathing exercises daily  -Consider having family read over educational materials regarding coping with symptoms of panic attacks -Consider making appointment at [facility] [place], with one of the Spanish-speaking counselors SUBJECTIVE: Pt. referred by Dr Hyman Hopes for anxiety and panic attacks:  Pt. here for referral regarding coping with symptoms of anxiety and panic attacks.  Pt. reports the following symptoms/concerns: Pt states that when she began taking Paxil 2 months ago, it seemed to be working well, but in the past month, her symptoms have increased. She notices that she is feeling a tightness in her face and jaws when she sleeps, and that she is having anxiety and panic attacks during the daytime. Symptoms include: heart palpitations, sweating, shaking, shortness of breath, chest pain, nausea, feeling dizzy, numbness, and chills and hot flashes. She also states that the eardrops she was given has not been working.  Duration of problem: one month Severity: moderate  OBJECTIVE: Orientation & Cognition: Oriented x3. Thought processes normal and appropriate to situation. Mood: tearful Affect: appropriate Appearance: appropriate Risk of harm to self or others: no risk of harm to self or others Substance use: none Psychiatric medication use: Unchanged from prior contact. Assessments administered: none  Diagnosis: Generalized anxiety disorder CPT Code:  F41.1 -------------------------------------------- Other(s) present in the room: Spanish interpreter on phone line  Time spent with patient in exam room: 40 minutes

## 2014-09-11 ENCOUNTER — Telehealth: Payer: Self-pay | Admitting: Internal Medicine

## 2014-09-11 ENCOUNTER — Telehealth: Payer: Self-pay

## 2014-09-11 DIAGNOSIS — H9209 Otalgia, unspecified ear: Secondary | ICD-10-CM

## 2014-09-11 NOTE — Telephone Encounter (Signed)
In house interpreter used-Belen' Patient states the ear drops prescribed are not working Instructed patient she can be seen at the ENT Referral placed in Epic

## 2014-09-11 NOTE — Telephone Encounter (Signed)
Per Dr. Hyman Hopes pt was advised to come in as a Walk In to see him, patient stated that she would come in Monday morning.

## 2014-09-15 ENCOUNTER — Telehealth: Payer: Self-pay | Admitting: General Practice

## 2014-09-15 NOTE — Telephone Encounter (Signed)
Patient presents to clinic at 4:48 pm stating that she was informed that she could walk in and be seen by Provider. After checking EPIC I confirmed that Dr. Hyman Hopes advised pt to come in as a Walk In to see him, patient patient was to come this morning.  Patient wants to know if she can be seen as walk in on Dr Louis Meckel next day in clinic.   Please assist

## 2014-09-18 ENCOUNTER — Encounter: Payer: Self-pay | Admitting: Internal Medicine

## 2014-09-18 ENCOUNTER — Ambulatory Visit: Payer: Self-pay | Attending: Internal Medicine | Admitting: Internal Medicine

## 2014-09-18 VITALS — BP 138/76 | HR 84 | Temp 98.8°F | Resp 16 | Wt 151.6 lb

## 2014-09-18 DIAGNOSIS — F32A Depression, unspecified: Secondary | ICD-10-CM

## 2014-09-18 DIAGNOSIS — F411 Generalized anxiety disorder: Secondary | ICD-10-CM

## 2014-09-18 DIAGNOSIS — H9209 Otalgia, unspecified ear: Secondary | ICD-10-CM

## 2014-09-18 DIAGNOSIS — M62830 Muscle spasm of back: Secondary | ICD-10-CM

## 2014-09-18 DIAGNOSIS — F329 Major depressive disorder, single episode, unspecified: Secondary | ICD-10-CM

## 2014-09-18 MED ORDER — PAROXETINE HCL 20 MG PO TABS: 20.0000 mg | ORAL_TABLET | Freq: Every day | ORAL | Status: DC

## 2014-09-18 MED ORDER — CYCLOBENZAPRINE HCL 10 MG PO TABS: 10.0000 mg | ORAL_TABLET | Freq: Three times a day (TID) | ORAL | Status: DC | PRN

## 2014-09-18 MED ORDER — CARBAMIDE PEROXIDE 6.5 % OT SOLN: 5.0000 [drp] | Freq: Two times a day (BID) | OTIC | Status: DC

## 2014-09-18 MED ORDER — HYDROXYZINE HCL 25 MG PO TABS: 25.0000 mg | ORAL_TABLET | Freq: Three times a day (TID) | ORAL | Status: DC | PRN

## 2014-09-18 NOTE — Patient Instructions (Signed)
Trastorno de ansiedad generalizada (Generalized Anxiety Disorder) El trastorno de ansiedad generalizada es un trastorno mental. Interfiere en las funciones vitales, incluyendo las relaciones, el trabajo y la escuela.  Es diferente de la ansiedad normal que todas las personas experimentan en algn momento de su vida en respuesta a sucesos y actividades especficas. En verdad, la ansiedad normal nos ayuda a prepararnos y atravesar estos acontecimientos y actividades de la vida. La ansiedad normal desaparece despus de que el evento o la actividad ha finalizado.  El trastorno de ansiedad generalizada no est necesariamente relacionada con eventos o actividades especficas. Tambin causa un exceso de ansiedad en proporcin a sucesos o actividades especficas. En este trastorno la ansiedad es difcil de controlar. Los sntomas pueden variar de leves a muy graves. Las personas que sufren de trastorno de ansiedad generalizada pueden tener intensas olas de ansiedad con sntomas fsicos (ataques de pnico).  SNTOMAS  La ansiedad y la preocupacin asociada a este trastorno son difciles de controlar. Esta ansiedad y la preocupacin estn relacionados con muchos eventos de la vida y sus actividades y tambin ocurre durante ms das de los que no ocurre, durante 6 meses o ms. Las personas que la sufren pueden tener tres o ms de los siguientes sntomas (uno o ms en los nios):   Agitacin   Fatiga.  Dificultades de concentracin.   Irritabilidad.  Tensin muscular  Dificultad para dormirse o sueo poco satisfactorio. DIAGNSTICO  Se diagnostica a travs de una evaluacin realizada por el mdico. El mdico le har preguntas acerca de su estado de nimo, sntomas fsicos y sucesos de su vida. Le har preguntas sobre su historia clnica, el consumo de alcohol o drogas, incluyendo los medicamentos recetados. Tambin le har un examen fsico e indicar anlisis de sangre. Ciertas enfermedades y el uso de  determinadas sustancias pueden causar sntomas similares a este trastorno. Su mdico lo puede derivar a un especialista en salud mental para una evaluacin ms profunda..  TRATAMIENTO  Las terapias siguientes se utilizan en el tratamiento de este trastorno:   Medicamentos - Se recetan antidepresivos para el control diario a largo plazo. Pueden indicarse tambin medicamentos para combatir la ansiedad en los casos graves, especialmente cuando ocurren ataques de pnico.   Terapia conversada (psicoterapia) Ciertos tipos de psicoterapia pueden ser tiles en el tratamiento del trastorno de ansiedad generalizada, proporcionando apoyo, educacin y orientacin. Una forma de psicoterapia llamada terapia cognitivo-conductual puede ensearle formas saludables de pensar y reaccionar a los eventos y actividades de la vida diaria.  Tcnicasde manejo del estrs- Estas tcnicas incluyen el yoga, la meditacin y el ejercicio y pueden ser muy tiles cuando se practican con regularidad. Un especialista en salud mental puede ayudar a determinar qu tratamiento es mejor para usted. Algunas personas obtienen mejora con una terapia. Sin embargo, otras personas requieren una combinacin de terapias.  Document Released: 07/02/2012 Document Revised: 07/22/2013 ExitCare Patient Information 2015 ExitCare, [employer]. This information is not intended to replace advice given to you by your health care provider. Make sure you discuss any questions you have with your health care provider.  

## 2014-09-18 NOTE — Progress Notes (Signed)
Interpreter line used Patient complains of headache, dizziness and sinus pressure  That started about three weeks ago Patient also is complaining of pain to both ears with the left side being the worse

## 2014-09-18 NOTE — Progress Notes (Signed)
Patient ID: Tracie [name], female   DOB: 1980-04-03, 34 y.o.   MRN: [id number]   Tracie [name], is a [age] y.o. female  [id number]  [id number]  DOB - 1980-04-03  Chief Complaint  Patient presents with  . Sinusitis        Subjective:   Tracie [name] is a 34 y.o. female here today for a follow up visit. Patient with history of PTSD and generalized anxiety disorder on SSRI came in to clinic today with complaint of more tension especially in her teeth and jaw. She feels tensed up very easily. She continues her medications without side effects, she thinks Paxil helped in the first month but she has not seen any much more benefit. She also complained of some ringing and pain in her left ear, but no discharge or drainage. No fever. She has multiple bodily symptoms and complaints including pain in her back nonradiating, no associated urinary symptoms, no change in bowel habit. She lives at home with husband and kids. Patient has No headache, No chest pain, No abdominal pain - No Nausea, No new weakness tingling or numbness, No Cough - SOB.  Problem  Ear Pain  Muscle Spasm of Back    ALLERGIES: No Known Allergies  PAST MEDICAL HISTORY: Past Medical History  Diagnosis Date  . Headache     MEDICATIONS AT HOME: Prior to Admission medications   Medication Sig Start Date End Date Taking? Authorizing Provider  carbamide peroxide (DEBROX) 6.5 % otic solution Place 5 drops into both ears 2 (two) times daily. 09/18/14   Quentin Angstlugbemiga E Norbert Malkin, MD  cyclobenzaprine (FLEXERIL) 10 MG tablet Take 1 tablet (10 mg total) by mouth 3 (three) times daily as needed for muscle spasms. 09/18/14   Quentin Angstlugbemiga E Inez Rosato, MD  hydrOXYzine (ATARAX/VISTARIL) 25 MG tablet Take 1 tablet (25 mg total) by mouth 3 (three) times daily as needed. 09/18/14   Quentin Angstlugbemiga E Rodert Hinch, MD  PARoxetine (PAXIL) 20 MG tablet Take 1 tablet (20 mg total) by mouth daily. 09/18/14   Quentin Angstlugbemiga E Kenishia Plack, MD  predniSONE  (DELTASONE) 20 MG tablet Take 1 tablet (20 mg total) by mouth 2 (two) times daily. 05/17/14   Reuben Likesavid C Keller, MD  pseudoephedrine (SUDAFED 12 HOUR) 120 MG 12 hr tablet Take 1 tablet (120 mg total) by mouth daily. Patient not taking: Reported on 08/21/2014 04/20/14   Sherren MochaEva N Shaw, MD  SUMAtriptan (IMITREX) 50 MG tablet Take 1 tablet (50 mg total) by mouth every 2 (two) hours as needed for migraine or headache. May repeat in 2 hours if headache persists or recurs. Patient not taking: Reported on 06/12/2014 05/17/14   Reuben Likesavid C Keller, MD  traZODone (DESYREL) 50 MG tablet Take 1 tablet (50 mg total) by mouth at bedtime. Patient not taking: Reported on 08/21/2014 06/12/14   Quentin Angstlugbemiga E Kinser Fellman, MD     Objective:   Filed Vitals:   09/18/14 1434  BP: 138/76  Pulse: 84  Temp: 98.8 F (37.1 C)  Resp: 16  Weight: 151 lb 9.6 oz (68.765 kg)  SpO2: 100%    Exam General appearance : Awake, alert, not in any distress. Speech Clear. Not toxic looking HEENT: Atraumatic and Normocephalic, pupils equally reactive to light and accomodation Neck: supple, no JVD. No cervical lymphadenopathy.  Chest:Good air entry bilaterally, no added sounds  CVS: S1 S2 regular, no murmurs.  Abdomen: Bowel sounds present, Non tender and not distended with no gaurding, rigidity or rebound. Extremities: B/L Lower Ext shows no edema, both legs  are warm to touch Neurology: Awake alert, and oriented X 3, CN II-XII intact, Non focal Skin:No Rash  Data Review Lab Results  Component Value Date   HGBA1C 5.5 09/09/2013     Assessment & Plan   1. Generalized anxiety disorder  Increase from 10 mg to - hydrOXYzine (ATARAX/VISTARIL) 25 MG tablet; Take 1 tablet (25 mg total) by mouth 3 (three) times daily as needed.  Dispense: 90 tablet; Refill: 3 - PARoxetine (PAXIL) 20 MG tablet; Take 1 tablet (20 mg total) by mouth daily.  Dispense: 30 tablet; Refill: 3  2. Ear pain, unspecified laterality  - carbamide peroxide (DEBROX) 6.5 %  otic solution; Place 5 drops into both ears 2 (two) times daily.  Dispense: 15 mL; Refill: 1  3. Depression (emotion)  Increase from 10 mg to - PARoxetine (PAXIL) 20 MG tablet; Take 1 tablet (20 mg total) by mouth daily.  Dispense: 30 tablet; Refill: 3  4. Muscle spasm of back  - cyclobenzaprine (FLEXERIL) 10 MG tablet; Take 1 tablet (10 mg total) by mouth 3 (three) times daily as needed for muscle spasms.  Dispense: 30 tablet; Refill: 0  Patient have been counseled extensively about nutrition and exercise Return if symptoms worsen or fail to improve, for Generalized Anxiety Disorder, Follow up Pain and comorbidities.  The patient was given clear instructions to go to ER or return to medical center if symptoms don't improve, worsen or new problems develop. The patient verbalized understanding. The patient was told to call to get lab results if they haven't heard anything in the next week.   This note has been created with Education officer, environmental. Any transcriptional errors are unintentional.    Jeanann Lewandowsky, MD, MHA, CPE, FACP, FAAP [facility] [place], [place] [phone]   09/18/2014, 4:24 PM

## 2014-09-23 NOTE — Telephone Encounter (Signed)
Nurse called patient, via in house interpreter, Tracie [name]. Patient verified date of birth. Patient still having ear pain, has been using ear drops for one month without relief. Nurse will send message to Dr. Hyman HopesJegede about ear pain and follow up with patient.  Patient voices understanding and has no further questions at this time.

## 2014-09-23 NOTE — Telephone Encounter (Signed)
Pt. Is still experiencing ear pain.[name] Kitchen.[name] Kitchen.[name] Kitchen.[name] Kitchen.please advise patient with what she needs to do.[name] Kitchen.[name] Kitchen..[name] Kitchen

## 2014-10-22 ENCOUNTER — Telehealth: Payer: Self-pay | Admitting: Internal Medicine

## 2014-10-22 NOTE — Telephone Encounter (Signed)
Patient came into office requesting to speak to nurse regarding  Paxil  tablet patient was told by PCP to take 1 tablet by mouth but was given only 1/2 . Patient is down to 1 1/2 tablet with no refills. Please f/u with patient

## 2014-11-16 ENCOUNTER — Emergency Department (INDEPENDENT_AMBULATORY_CARE_PROVIDER_SITE_OTHER)
Admission: EM | Admit: 2014-11-16 | Discharge: 2014-11-16 | Disposition: A | Discharge status: Home / Self Care | Payer: Self-pay | Source: Home / Self Care | Attending: Family Medicine | Admitting: Family Medicine

## 2014-11-16 ENCOUNTER — Encounter (HOSPITAL_COMMUNITY): Payer: Self-pay | Admitting: Emergency Medicine

## 2014-11-16 DIAGNOSIS — H6092 Unspecified otitis externa, left ear: Secondary | ICD-10-CM

## 2014-11-16 MED ORDER — NEOMYCIN-POLYMYXIN-HC 3.5-10000-1 OT SUSP
OTIC | Status: AC
  Filled 2014-11-16: qty 10

## 2014-11-16 MED ORDER — CEPHALEXIN 500 MG PO CAPS: 500.0000 mg | ORAL_CAPSULE | Freq: Three times a day (TID) | ORAL | Status: DC

## 2014-11-16 NOTE — Discharge Instructions (Signed)
Use heat and advil as needed, use ear medicine drops and prescription unti finished, return on wed for ear wick removal.

## 2014-11-16 NOTE — ED Provider Notes (Signed)
CSN: [id number]     Arrival date & time 11/16/14  1417 History   First MD Initiated Contact with Patient 11/16/14 1533     Chief Complaint  Patient presents with  . Otalgia   (Consider location/radiation/quality/duration/timing/severity/associated sxs/prior Treatment) Patient is a 34 y.o. female presenting with ear pain. The history is provided by the patient.  Otalgia Location:  Left Behind ear:  No abnormality Quality:  Throbbing Severity:  Mild Onset quality:  Gradual Duration:  2 days Progression:  Unchanged Chronicity:  New Relieved by:  None tried Worsened by:  Nothing tried Ineffective treatments:  None tried Associated symptoms: no congestion, no ear discharge, no fever and no rhinorrhea   Risk factors: no chronic ear infection     Past Medical History  Diagnosis Date  . Headache    History reviewed. No pertinent past surgical history. No family history on file. Social History  Substance Use Topics  . Smoking status: Never Smoker   . Smokeless tobacco: None  . Alcohol Use: No   OB History    No data available     Review of Systems  Constitutional: Negative.  Negative for fever.  HENT: Positive for ear pain. Negative for congestion, dental problem, ear discharge, facial swelling, postnasal drip and rhinorrhea.     Allergies  Review of patient's allergies indicates no known allergies.  Home Medications   Prior to Admission medications   Medication Sig Start Date End Date Taking? Authorizing Provider  carbamide peroxide (DEBROX) 6.5 % otic solution Place 5 drops into both ears 2 (two) times daily. 09/18/14   Quentin Angst, MD  cyclobenzaprine (FLEXERIL) 10 MG tablet Take 1 tablet (10 mg total) by mouth 3 (three) times daily as needed for muscle spasms. 09/18/14   Quentin Angst, MD  hydrOXYzine (ATARAX/VISTARIL) 25 MG tablet Take 1 tablet (25 mg total) by mouth 3 (three) times daily as needed. 09/18/14   Quentin Angst, MD  PARoxetine (PAXIL)  20 MG tablet Take 1 tablet (20 mg total) by mouth daily. 09/18/14   Quentin Angst, MD  predniSONE (DELTASONE) 20 MG tablet Take 1 tablet (20 mg total) by mouth 2 (two) times daily. 05/17/14   Reuben Likes, MD  pseudoephedrine (SUDAFED 12 HOUR) 120 MG 12 hr tablet Take 1 tablet (120 mg total) by mouth daily. Patient not taking: Reported on 08/21/2014 04/20/14   Sherren Mocha, MD  SUMAtriptan (IMITREX) 50 MG tablet Take 1 tablet (50 mg total) by mouth every 2 (two) hours as needed for migraine or headache. May repeat in 2 hours if headache persists or recurs. Patient not taking: Reported on 06/12/2014 05/17/14   Reuben Likes, MD  traZODone (DESYREL) 50 MG tablet Take 1 tablet (50 mg total) by mouth at bedtime. Patient not taking: Reported on 08/21/2014 06/12/14   Quentin Angst, MD   Meds Ordered and Administered this Visit  Medications - No data to display  BP 128/79 mmHg  Pulse 78  Temp(Src) 98.5 F (36.9 C) (Oral)  Resp 16  SpO2 100% No data found.   Physical Exam  Constitutional: She appears well-developed and well-nourished. She appears distressed.  HENT:  Head: Normocephalic.  Right Ear: External ear normal.  Left Ear: There is swelling and tenderness. No drainage. No decreased hearing is noted.  Ears:  Mouth/Throat: Oropharynx is clear and moist.  Nursing note and vitals reviewed.   ED Course  Procedures (including critical care time)  Labs Review Labs Reviewed -  No data to display  Imaging Review No results found.   Visual Acuity Review  Right Eye Distance:   Left Eye Distance:   Bilateral Distance:    Right Eye Near:   Left Eye Near:    Bilateral Near:         MDM  No diagnosis found. Ear wick instilled and cortisporin gtts    Linna Hoff, MD 11/16/14 1615

## 2014-11-16 NOTE — ED Notes (Addendum)
Left ear pain.  Onset yesterday of pain.  Denies cough or runny nose.  Patient reports opening and closing mouth causes pain in ear

## 2014-11-19 ENCOUNTER — Encounter (HOSPITAL_COMMUNITY): Payer: Self-pay | Admitting: Emergency Medicine

## 2014-11-19 ENCOUNTER — Emergency Department (INDEPENDENT_AMBULATORY_CARE_PROVIDER_SITE_OTHER)
Admission: EM | Admit: 2014-11-19 | Discharge: 2014-11-19 | Disposition: A | Discharge status: Home / Self Care | Payer: Self-pay | Source: Home / Self Care

## 2014-11-19 DIAGNOSIS — H6092 Unspecified otitis externa, left ear: Secondary | ICD-10-CM

## 2014-11-19 NOTE — ED Notes (Signed)
The patient presented at the [facility] for a follow up exam for ear pain. The patient was seen at the [facility] on 11/16/2014 for the initial complaint.

## 2014-11-19 NOTE — Discharge Instructions (Signed)
We've removed the wick. Your ear looks like it is improving. Please continue to use the drops three times daily for the next few days. Be sure to finish your keflex medication. Please come back to see [place] if you're not all the way back to normal in one week.

## 2014-11-19 NOTE — ED Provider Notes (Signed)
CSN: [id number]     Arrival date & time 11/19/14  1913 History   None    Chief Complaint  Patient presents with  . Otalgia   HPI  63 yof returns for left ear pain, wick removal.   Seen here 3 days ago. Otitis externa. Had wick placed on started on corticosporin drops tid. Also started on keflex. Today returns for wick removal. States ear feels better. Denies fevers, chills.   Past Medical History  Diagnosis Date  . Headache    History reviewed. No pertinent past surgical history. History reviewed. No pertinent family history. Social History  Substance Use Topics  . Smoking status: Never Smoker   . Smokeless tobacco: None  . Alcohol Use: No   OB History    No data available     Review of Systems See HPI  Allergies  Review of patient's allergies indicates no known allergies.  Home Medications   Prior to Admission medications   Medication Sig Start Date End Date Taking? Authorizing Provider  carbamide peroxide (DEBROX) 6.5 % otic solution Place 5 drops into both ears 2 (two) times daily. 09/18/14   Quentin Angst, MD  cephALEXin (KEFLEX) 500 MG capsule Take 1 capsule (500 mg total) by mouth 3 (three) times daily. Take all of medicine and drink lots of fluids 11/16/14   Linna Hoff, MD  cyclobenzaprine (FLEXERIL) 10 MG tablet Take 1 tablet (10 mg total) by mouth 3 (three) times daily as needed for muscle spasms. 09/18/14   Quentin Angst, MD  hydrOXYzine (ATARAX/VISTARIL) 25 MG tablet Take 1 tablet (25 mg total) by mouth 3 (three) times daily as needed. 09/18/14   Quentin Angst, MD  PARoxetine (PAXIL) 20 MG tablet Take 1 tablet (20 mg total) by mouth daily. 09/18/14   Quentin Angst, MD  predniSONE (DELTASONE) 20 MG tablet Take 1 tablet (20 mg total) by mouth 2 (two) times daily. 05/17/14   Reuben Likes, MD  pseudoephedrine (SUDAFED 12 HOUR) 120 MG 12 hr tablet Take 1 tablet (120 mg total) by mouth daily. Patient not taking: Reported on 08/21/2014 04/20/14    Sherren Mocha, MD  SUMAtriptan (IMITREX) 50 MG tablet Take 1 tablet (50 mg total) by mouth every 2 (two) hours as needed for migraine or headache. May repeat in 2 hours if headache persists or recurs. Patient not taking: Reported on 06/12/2014 05/17/14   Reuben Likes, MD  traZODone (DESYREL) 50 MG tablet Take 1 tablet (50 mg total) by mouth at bedtime. Patient not taking: Reported on 08/21/2014 06/12/14   Quentin Angst, MD   Meds Ordered and Administered this Visit  Medications - No data to display  BP 125/77 mmHg  Pulse 88  Temp(Src) 98.6 F (37 C) (Oral)  Resp 18  SpO2 100%  LMP 10/26/2014 No data found.   Physical Exam  HENT:  Left Ear: Tympanic membrane normal.  Minimal to moderate white debris left canal. TM minimally visualized with no erythema appreciated. No tragus pain. No mastoid tenderness. No erythema.   Lymphadenopathy:       Head (left side): No submental, no submandibular, no tonsillar and no preauricular adenopathy present.    She has no cervical adenopathy.    ED Course  Procedures (including critical care time)  Labs Review Labs Reviewed - No data to display  Imaging Review No results found.   MDM   1. Otitis externa, left    Wick removed. Canal appears improved with  some debris. Pt states pain is much improved. Continue tid drops and finish keflex course. RTC if pain not resolved in 4-5 days.    Raelyn Ensign, [place] 11/19/14 2035

## 2014-12-03 ENCOUNTER — Ambulatory Visit: Payer: Self-pay | Attending: Internal Medicine

## 2014-12-30 ENCOUNTER — Encounter (HOSPITAL_COMMUNITY): Payer: Self-pay | Admitting: Emergency Medicine

## 2014-12-30 ENCOUNTER — Emergency Department (INDEPENDENT_AMBULATORY_CARE_PROVIDER_SITE_OTHER)
Admission: EM | Admit: 2014-12-30 | Discharge: 2014-12-30 | Disposition: A | Discharge status: Home / Self Care | Payer: Self-pay | Source: Home / Self Care | Attending: Emergency Medicine | Admitting: Emergency Medicine

## 2014-12-30 DIAGNOSIS — R252 Cramp and spasm: Secondary | ICD-10-CM

## 2014-12-30 DIAGNOSIS — J3089 Other allergic rhinitis: Secondary | ICD-10-CM

## 2014-12-30 NOTE — ED Provider Notes (Signed)
CSN: [id number]     Arrival date & time 12/30/14  1301 History   First MD Initiated Contact with Patient 12/30/14 1318     Chief Complaint  Patient presents with  . Facial Pain   (Consider location/radiation/quality/duration/timing/severity/associated sxs/prior Treatment) HPI Comments: 34 year old Hispanic female presents with complaints of paranasal and maxillary pressure, inflammation in the nose that hurts with breathing and pain beneath both of her ears. This seems to be associated with her discomfort. She is also complaining of dizziness. The symptoms started approximately 2 weeks ago. Denies fever, PND, sore throat or cough. Interestingly she has tapered off her and he presents recently. It is also having muscle spasms along with the above symptoms.   Past Medical History  Diagnosis Date  . Headache    History reviewed. No pertinent past surgical history. No family history on file. Social History  Substance Use Topics  . Smoking status: Never Smoker   . Smokeless tobacco: None  . Alcohol Use: No   OB History    No data available     Review of Systems  Constitutional: Negative for fever, activity change and fatigue.  HENT: Positive for ear pain. Negative for congestion, mouth sores, postnasal drip, rhinorrhea, sneezing and sore throat.   Eyes: Negative.  Negative for visual disturbance.  Respiratory: Negative for cough, shortness of breath and wheezing.   Cardiovascular: Negative for chest pain and leg swelling.  Gastrointestinal: Negative.   Genitourinary: Negative.   Neurological: Positive for dizziness. Negative for tremors, syncope, speech difficulty, numbness and headaches.  Psychiatric/Behavioral: Negative for behavioral problems. The patient is nervous/anxious.     Allergies  Review of patient's allergies indicates no known allergies.  Home Medications   Prior to Admission medications   Medication Sig Start Date End Date Taking? Authorizing Provider   hydrOXYzine (ATARAX/VISTARIL) 25 MG tablet Take 1 tablet (25 mg total) by mouth 3 (three) times daily as needed. 09/18/14  Yes Quentin Angst, MD  carbamide peroxide (DEBROX) 6.5 % otic solution Place 5 drops into both ears 2 (two) times daily. 09/18/14   Quentin Angst, MD  cephALEXin (KEFLEX) 500 MG capsule Take 1 capsule (500 mg total) by mouth 3 (three) times daily. Take all of medicine and drink lots of fluids 11/16/14   Linna Hoff, MD  cyclobenzaprine (FLEXERIL) 10 MG tablet Take 1 tablet (10 mg total) by mouth 3 (three) times daily as needed for muscle spasms. 09/18/14   Quentin Angst, MD  PARoxetine (PAXIL) 20 MG tablet Take 1 tablet (20 mg total) by mouth daily. 09/18/14   Quentin Angst, MD  predniSONE (DELTASONE) 20 MG tablet Take 1 tablet (20 mg total) by mouth 2 (two) times daily. 05/17/14   Reuben Likes, MD  pseudoephedrine (SUDAFED 12 HOUR) 120 MG 12 hr tablet Take 1 tablet (120 mg total) by mouth daily. Patient not taking: Reported on 08/21/2014 04/20/14   Sherren Mocha, MD  SUMAtriptan (IMITREX) 50 MG tablet Take 1 tablet (50 mg total) by mouth every 2 (two) hours as needed for migraine or headache. May repeat in 2 hours if headache persists or recurs. Patient not taking: Reported on 06/12/2014 05/17/14   Reuben Likes, MD  traZODone (DESYREL) 50 MG tablet Take 1 tablet (50 mg total) by mouth at bedtime. Patient not taking: Reported on 08/21/2014 06/12/14   Quentin Angst, MD   Meds Ordered and Administered this Visit  Medications - No [id number]  BP 109/73 mmHg  Pulse 94  Temp(Src) 98.7 F (37.1 C) (Oral)  Resp 18  SpO2 100% No data found.   Physical Exam  Constitutional: She appears well-developed and well-nourished. No distress.  HENT:  Mouth/Throat: Oropharynx is clear and moist. No oropharyngeal exudate.  Bilateral TMs are difficult to see completely due to cerumen. No erythema. No apparent retraction. There is very mild, equivocal tenderness  chest posterior to the angle of the jaw bilaterally. No palpable lymphadenitis. Minor swelling of the nasal turbinates but not boggy or especially erythematous  Neck: Normal range of motion. Neck supple.  Cardiovascular: Normal rate, regular rhythm, normal heart sounds and intact distal pulses.   Pulmonary/Chest: Effort normal and breath sounds normal.  Musculoskeletal: She exhibits no edema or tenderness.  Lymphadenopathy:    She has no cervical adenopathy.  Neurological: She is alert. No cranial nerve deficit. She exhibits normal muscle tone.  Skin: Skin is warm and dry.  Nursing note and vitals reviewed.   ED Course  Procedures (including critical care time)  Labs Review Labs Reviewed - No data to display  Imaging Review No results found.   Visual Acuity Review  Right Eye Distance:   Left Eye Distance:   Bilateral Distance:    Right Eye Near:   Left Eye Near:    Bilateral Near:         MDM   1. Other allergic rhinitis   2. Muscle cramps    some symptoms in which she describes are somewhat vague but may be due to her tapering off of the  depressives. Other symptoms are similar to allergic rhinitis. May try Claritin, flonase, nasal spray Sudafed PE 10 mg      Hayden Rasmussen, NP 12/30/14 1338

## 2014-12-30 NOTE — Discharge Instructions (Signed)
Rinitis alrgica (Allergic Rhinitis) May try Claritin, flonase, nasal spray Sudafed PE 10 mg  La rinitis alrgica ocurre cuando las Avalon mucosas de la nariz responden a los alrgenos. Los alrgenos son las partculas que estn en el aire y que hacen que el cuerpo tenga una reaccin Counselling psychologist. Esto hace que usted libere anticuerpos alrgicos. A travs de una cadena de eventos, estos finalmente hacen que usted libere histamina en la corriente sangunea. Aunque la funcin de la histamina es proteger al organismo, es esta liberacin de histamina lo que provoca malestar, como los estornudos frecuentes, la congestin y goteo y Control and instrumentation engineer.  CAUSAS La causa de la rinitis Merchandiser, retail (fiebre del heno) son los alrgenos del polen que pueden provenir del csped, los rboles y Theme park manager. La causa de la rinitis IT consultant (rinitis alrgica perenne) son los alrgenos, como los caros del polvo domstico, la caspa de las mascotas y las esporas del moho. SNTOMAS  Secrecin nasal (congestin).  Goteo y picazn nasales con estornudos y Arboriculturist. DIAGNSTICO Su mdico puede ayudarlo a Warehouse manager alrgeno o los alrgenos que desencadenan sus sntomas. Si usted y su mdico no pueden Chief Strategy Officer cul es el alrgeno, pueden hacerse anlisis de sangre o estudios de la piel. El mdico diagnosticar la afeccin despus de hacerle una historia clnica y un examen fsico. Adems, puede evaluarlo para detectar la presencia de otras enfermedades afines, como asma, conjuntivitis u otitis. TRATAMIENTO La rinitis alrgica no tiene [place], pero puede controlarse con lo siguiente:  Medicamentos que [employer] sntomas de [place], por ejemplo, vacunas contra la [place], aerosoles nasales y antihistamnicos por va oral.  Evitar el alrgeno. La fiebre del heno a menudo puede tratarse con antihistamnicos en las formas de pldoras o aerosol nasal. Los antihistamnicos bloquean los efectos de la histamina.  Existen medicamentos de venta libre que pueden ayudar con la congestin nasal y la hinchazn alrededor de los ojos. Consulte a su mdico antes de tomar o administrarse este medicamento. Si la prevencin del alrgeno o el medicamento recetado no dan resultado, existen muchos medicamentos nuevos que su mdico puede recetarle. Pueden usarse medicamentos ms fuertes si las medidas iniciales no son efectivas. Pueden aplicarse inyecciones desensibilizantes si los medicamentos y la prevencin no funcionan. La desensibilizacin ocurre cuando un paciente recibe vacunas constantes hasta que el cuerpo se vuelve menos sensible al alrgeno. Asegrese de Medical sales representative seguimiento con su mdico si los problemas continan. INSTRUCCIONES PARA EL CUIDADO EN EL HOGAR No es posible evitar por completo los alrgenos, pero puede reducir los sntomas al tomar medidas para limitar su exposicin a ellos. Es muy til saber exactamente a qu es alrgico para que pueda evitar sus desencadenantes especficos. SOLICITE ATENCIN MDICA SI:  Lance Muss.  Desarrolla una tos que no cesa fcilmente (persistente).  Le falta el aire.  Comienza a tener sibilancias.  Los sntomas interfieren con las actividades diarias normales.   Esta informacin no tiene Theme park manager el consejo del mdico. Asegrese de hacerle al mdico cualquier pregunta que tenga.   Document Released: 12/15/2004 Document Revised: 03/28/2014 Elsevier Interactive Patient Education [employer]

## 2014-12-30 NOTE — ED Notes (Signed)
C/o facial pressure and dryness of bilateral nares onset 2 weeks Thought it was her anxiety Reports she was just taken off meds for depression A&O x4... No acute distress.

## 2015-01-08 ENCOUNTER — Ambulatory Visit: Payer: Self-pay | Admitting: Internal Medicine

## 2015-01-12 ENCOUNTER — Encounter: Payer: Self-pay | Admitting: Family Medicine

## 2015-01-12 ENCOUNTER — Ambulatory Visit: Payer: Self-pay | Attending: Internal Medicine | Admitting: Family Medicine

## 2015-01-12 ENCOUNTER — Other Ambulatory Visit: Payer: Self-pay

## 2015-01-12 VITALS — BP 127/79 | HR 111 | Temp 98.7°F | Resp 18 | Ht 63.5 in | Wt 165.0 lb

## 2015-01-12 DIAGNOSIS — Z79899 Other long term (current) drug therapy: Secondary | ICD-10-CM | POA: Insufficient documentation

## 2015-01-12 DIAGNOSIS — R6884 Jaw pain: Secondary | ICD-10-CM | POA: Insufficient documentation

## 2015-01-12 DIAGNOSIS — K2970 Gastritis, unspecified, without bleeding: Secondary | ICD-10-CM | POA: Insufficient documentation

## 2015-01-12 MED ORDER — TRAMADOL HCL 50 MG PO TABS: 50.0000 mg | ORAL_TABLET | Freq: Three times a day (TID) | ORAL | Status: DC | PRN

## 2015-01-12 MED ORDER — PANTOPRAZOLE SODIUM 40 MG PO TBEC: 40.0000 mg | DELAYED_RELEASE_TABLET | Freq: Every day | ORAL | Status: DC

## 2015-01-12 NOTE — Progress Notes (Signed)
CC:  HPI: Tracie [name] is a 34 y.o. female who presents with LUQ pain described as '" a burning pain under the left rib which is intermittent".for the last 2 years. She moves her bowels 1-2/day. Pain is present at the moment and is a 6/10. Denies nausea, vomiting.  She also requests something for pain in her jaw and she grinds her teeth a lot at night when she sleeps due to anxiety she says; she was seen by the dentist and was told to take ibuprofen or Tylenol which is not helping. Patient has No headache, No chest pain, No abdominal pain - No Nausea, No new weakness tingling or numbness, No Cough - SOB.  No Known Allergies Past Medical History  Diagnosis Date  . Headache    Current Outpatient Prescriptions on File Prior to Visit  Medication Sig Dispense Refill  . carbamide peroxide (DEBROX) 6.5 % otic solution Place 5 drops into both ears 2 (two) times daily. (Patient not taking: Reported on 01/12/2015) 15 mL 1  . cephALEXin (KEFLEX) 500 MG capsule Take 1 capsule (500 mg total) by mouth 3 (three) times daily. Take all of medicine and drink lots of fluids (Patient not taking: Reported on 01/12/2015) 21 capsule 0  . cyclobenzaprine (FLEXERIL) 10 MG tablet Take 1 tablet (10 mg total) by mouth 3 (three) times daily as needed for muscle spasms. (Patient not taking: Reported on 01/12/2015) 30 tablet 0  . hydrOXYzine (ATARAX/VISTARIL) 25 MG tablet Take 1 tablet (25 mg total) by mouth 3 (three) times daily as needed. (Patient not taking: Reported on 01/12/2015) 90 tablet 3  . PARoxetine (PAXIL) 20 MG tablet Take 1 tablet (20 mg total) by mouth daily. (Patient not taking: Reported on 01/12/2015) 30 tablet 3  . predniSONE (DELTASONE) 20 MG tablet Take 1 tablet (20 mg total) by mouth 2 (two) times daily. (Patient not taking: Reported on 01/12/2015) 10 tablet 0  . pseudoephedrine (SUDAFED 12 HOUR) 120 MG 12 hr tablet Take 1 tablet (120 mg total) by mouth daily. (Patient not taking: Reported on  08/21/2014) 30 tablet 0  . SUMAtriptan (IMITREX) 50 MG tablet Take 1 tablet (50 mg total) by mouth every 2 (two) hours as needed for migraine or headache. May repeat in 2 hours if headache persists or recurs. (Patient not taking: Reported on 06/12/2014) 10 tablet 0  . traZODone (DESYREL) 50 MG tablet Take 1 tablet (50 mg total) by mouth at bedtime. (Patient not taking: Reported on 08/21/2014) 90 tablet 3   No current facility-administered medications on file prior to visit.   History reviewed. No pertinent family history. Social History   Social History  . Marital Status: Single    Spouse Name: N/A  . Number of Children: N/A  . Years of Education: N/A   Occupational History  . Not on file.   Social History Main Topics  . Smoking status: Never Smoker   . Smokeless tobacco: Not on file  . Alcohol Use: No  . Drug Use: No  . Sexual Activity: Yes    Birth Control/ Protection: None   Other Topics Concern  . Not on file   Social History Narrative    Review of Systems: Constitutional: Negative for fever, chills, diaphoresis, activity change, appetite change and fatigue. HENT: Negative for ear pain, nosebleeds, congestion, facial swelling, rhinorrhea, neck pain, neck stiffness and ear discharge, jaw pain Eyes: Negative for pain, discharge, redness, itching and visual disturbance. Respiratory: Negative for cough, choking, chest tightness, shortness of breath,  wheezing and stridor.  Cardiovascular: Negative for chest pain, palpitations and leg swelling. Gastrointestinal: see hpi Genitourinary: Negative for dysuria, urgency, frequency, hematuria, flank pain, decreased urine volume, difficulty urinating and dyspareunia.  Musculoskeletal: Negative for back pain, joint swelling, arthralgias and gait problem. Neurological: Negative for dizziness, tremors, seizures, syncope, facial asymmetry, speech difficulty, weakness, light-headedness, numbness and headaches.  Hematological: Negative for  adenopathy. Does not bruise/bleed easily. Psychiatric/Behavioral: Negative for hallucinations, behavioral problems, confusion, dysphoric mood, decreased concentration and agitation.    Objective:   Filed Vitals:   01/12/15 1508  BP: 127/79  Pulse: 111  Temp: 98.7 F (37.1 C)  Resp: 18    Physical Exam: Constitutional: Patient appears well-developed and well-nourished. No distress. HENT: Normocephalic, atraumatic, External right and left ear normal. Oropharynx is clear and moist.  Eyes: Conjunctivae and EOM are normal. PERRLA, no scleral icterus. Neck: Normal ROM. Neck supple. No JVD. No tracheal deviation. No thyromegaly. CVS: Tachycardic rate, regular rhythm, S1/S2 +, no murmurs, no gallops, no carotid bruit.  Pulmonary: Effort and breath sounds normal, no stridor, rhonchi, wheezes, rales.  Abdominal: Epigastric and left upper quadrant tenderness  Musculoskeletal: Normal range of motion. No edema and no tenderness.  Lymphadenopathy: No lymphadenopathy noted, cervical, inguinal or axillary Neuro: Alert. Normal reflexes, muscle tone coordination. No cranial nerve deficit. Skin: Skin is warm and dry. No rash noted. Not diaphoretic. No erythema. No pallor. Psychiatric: Normal mood and affect. Behavior, judgment, thought content normal.  Lab Results  Component Value Date   WBC 5.3 12/05/2013   HGB 10.7* 12/05/2013   HCT 33.9* 12/05/2013   MCV 83.9 12/05/2013   PLT 243 12/05/2013   Lab Results  Component Value Date   CREATININE 0.53 12/05/2013   BUN 8 12/05/2013   NA 138 12/05/2013   K 4.3 12/05/2013   CL 105 12/05/2013   CO2 20 12/05/2013    Lab Results  Component Value Date   HGBA1C 5.5 09/09/2013      Assessment and plan:  Gastritis: Placed on pantoprazole. We'll need to be assessed for H. pylori antibody symptoms persist. She will return at her next office visit to follow-up with her PCP  Jaw pain: I have given her tramadol for jaw pain which she experiences  from grinding her teeth.  Jaclyn ShaggyEnobong, Ama[place], MD. [facility] [phone] 01/12/2015, 3:38 PM

## 2015-01-12 NOTE — Patient Instructions (Signed)

## 2015-01-12 NOTE — Progress Notes (Signed)
Pt's here for L side, side pain on and off for x2 year now. Rate pain at level 6.  Pt was given medication for pain (abx) on her R side w/o no relief. Stool test was performed but return negative.

## 2015-01-20 ENCOUNTER — Ambulatory Visit (HOSPITAL_COMMUNITY)
Admission: RE | Admit: 2015-01-20 | Discharge: 2015-01-20 | Disposition: A | Discharge status: Home / Self Care | Payer: Self-pay | Source: Ambulatory Visit | Attending: Internal Medicine | Admitting: Internal Medicine

## 2015-01-20 DIAGNOSIS — R1031 Right lower quadrant pain: Secondary | ICD-10-CM | POA: Insufficient documentation

## 2015-01-20 DIAGNOSIS — M25551 Pain in right hip: Secondary | ICD-10-CM | POA: Insufficient documentation

## 2015-01-20 MED ORDER — IOHEXOL 300 MG/ML  SOLN
100.0000 mL | Freq: Once | INTRAMUSCULAR | Status: AC | PRN
  Administered 2015-01-20: 100 mL via INTRAVENOUS

## 2015-01-22 ENCOUNTER — Telehealth: Payer: Self-pay | Admitting: Internal Medicine

## 2015-01-22 NOTE — Telephone Encounter (Signed)
Pt. Would like to know if she will receive results from her CT scan prior to her appointment with her pcp

## 2015-01-23 NOTE — Telephone Encounter (Signed)
Patient is calling to see if she can know her results because she is feeling sick and her next appointment is not until December. Please follow up with pt.

## 2015-01-23 NOTE — Telephone Encounter (Signed)
-----   Message from Quentin Angstlugbemiga E Jegede, MD sent at 01/21/2015  5:38 PM EDT ----- Please inform patient that her CT abdomen and pelvis shows "No acute findings in the abdomen or pelvis. Specifically, no features to explain the right lower quadrant pain, continue treatment for stomach irritation, follow up as needed.

## 2015-01-23 NOTE — Telephone Encounter (Signed)
Medical Assistant used [employer] to contact patient.  Interpreter Name: Altamese CarolinaFrances Interpreter #: [phone]  Patient informed of her CT of abdomen and pelvis showing no acute findings. No specific findings were found to explain the right lower quadrant pain. Patient advised to continue with treatment for stomach irritation and follwoup as needed.  Patient states she has received no relief from medications . She will continue with medications and follow-up in December. Patient had no further questions.

## 2015-01-29 ENCOUNTER — Other Ambulatory Visit (HOSPITAL_COMMUNITY)
Admission: RE | Admit: 2015-01-29 | Discharge: 2015-01-29 | Disposition: A | Discharge status: Home / Self Care | Payer: Self-pay | Source: Ambulatory Visit | Attending: Family Medicine | Admitting: Family Medicine

## 2015-01-29 ENCOUNTER — Emergency Department (INDEPENDENT_AMBULATORY_CARE_PROVIDER_SITE_OTHER)
Admission: EM | Admit: 2015-01-29 | Discharge: 2015-01-29 | Disposition: A | Discharge status: Home / Self Care | Payer: Self-pay | Source: Home / Self Care | Attending: Family Medicine | Admitting: Family Medicine

## 2015-01-29 ENCOUNTER — Encounter (HOSPITAL_COMMUNITY): Payer: Self-pay | Admitting: Emergency Medicine

## 2015-01-29 DIAGNOSIS — R1032 Left lower quadrant pain: Secondary | ICD-10-CM

## 2015-01-29 DIAGNOSIS — R1031 Right lower quadrant pain: Secondary | ICD-10-CM

## 2015-01-29 DIAGNOSIS — N72 Inflammatory disease of cervix uteri: Secondary | ICD-10-CM

## 2015-01-29 DIAGNOSIS — N730 Acute parametritis and pelvic cellulitis: Secondary | ICD-10-CM

## 2015-01-29 DIAGNOSIS — N760 Acute vaginitis: Secondary | ICD-10-CM | POA: Insufficient documentation

## 2015-01-29 DIAGNOSIS — Z113 Encounter for screening for infections with a predominantly sexual mode of transmission: Secondary | ICD-10-CM | POA: Insufficient documentation

## 2015-01-29 DIAGNOSIS — G8929 Other chronic pain: Secondary | ICD-10-CM

## 2015-01-29 DIAGNOSIS — R102 Pelvic and perineal pain: Secondary | ICD-10-CM

## 2015-01-29 DIAGNOSIS — M940 Chondrocostal junction syndrome [Tietze]: Secondary | ICD-10-CM

## 2015-01-29 DIAGNOSIS — B9689 Other specified bacterial agents as the cause of diseases classified elsewhere: Secondary | ICD-10-CM

## 2015-01-29 DIAGNOSIS — A499 Bacterial infection, unspecified: Secondary | ICD-10-CM

## 2015-01-29 LAB — POCT URINALYSIS DIP (DEVICE)
Bilirubin Urine: NEGATIVE
GLUCOSE, UA: NEGATIVE mg/dL
Hgb urine dipstick: NEGATIVE
KETONES UR: NEGATIVE mg/dL
LEUKOCYTES UA: NEGATIVE
Nitrite: NEGATIVE
PROTEIN: NEGATIVE mg/dL
Specific Gravity, Urine: 1.025 (ref 1.005–1.030)
Urobilinogen, UA: 0.2 mg/dL (ref 0.0–1.0)
pH: 6.5 (ref 5.0–8.0)

## 2015-01-29 LAB — POCT PREGNANCY, URINE: Preg Test, Ur: NEGATIVE

## 2015-01-29 MED ORDER — AZITHROMYCIN 250 MG PO TABS
ORAL_TABLET | ORAL | Status: AC
  Filled 2015-01-29: qty 4

## 2015-01-29 MED ORDER — METRONIDAZOLE 500 MG PO TABS: 500.0000 mg | ORAL_TABLET | Freq: Two times a day (BID) | ORAL | Status: DC

## 2015-01-29 MED ORDER — LIDOCAINE HCL (PF) 1 % IJ SOLN
INTRAMUSCULAR | Status: AC
  Filled 2015-01-29: qty 5

## 2015-01-29 MED ORDER — AZITHROMYCIN 250 MG PO TABS
1000.0000 mg | ORAL_TABLET | Freq: Every day | ORAL | Status: DC
  Administered 2015-01-29: 1000 mg via ORAL

## 2015-01-29 MED ORDER — CEFTRIAXONE SODIUM 250 MG IJ SOLR
250.0000 mg | Freq: Once | INTRAMUSCULAR | Status: AC
  Administered 2015-01-29: 250 mg via INTRAMUSCULAR

## 2015-01-29 MED ORDER — CEFTRIAXONE SODIUM 250 MG IJ SOLR
INTRAMUSCULAR | Status: AC
Start: 2015-01-29 — End: 2015-01-29
  Filled 2015-01-29: qty 250

## 2015-01-29 NOTE — ED Provider Notes (Signed)
CSN: [id number]     Arrival date & time 01/29/15  1306 History   First MD Initiated Contact with Patient 01/29/15 1342     Chief Complaint  Patient presents with  . Flank Pain   (Consider location/radiation/quality/duration/timing/severity/associated sxs/prior Treatment) HPI Comments: 34 year old Tracie [name] is complaining of pain to the left lower mid chest radiating to the left and left mid back. She has had this for 3 years often known. She states it is while seated and worse worse when supine. It is not necessarily worse with movement. She is also complaining of mild pain in the abdomen which is persistent now for 2 months. She was told once that she had gastritis. An abdominal CT scan was performed on 01/20/2015 and her were no abnormal findings. She is also complaining of pain in the bilateral pelvis sees and over the suprapubic area. This too has been occurring for 2 months. She saw her GYN for this a couple of months ago and was told to follow-up with her PCP and apparently there was no treatment according to the patient. She states she has had White vaginal discharge now for several weeks. No vomiting or diarrhea. She is having normal daily bowel movements. She did see blood in her stool one day last week but has not occurred since. No fevers or chills. No chest pain or shortness of breath.  The history is provided by the patient. The history is limited by a language barrier. A language interpreter was used.    Past Medical History  Diagnosis Date  . Headache    History reviewed. No pertinent past surgical history. No family history on file. Social History  Substance Use Topics  . Smoking status: Never Smoker   . Smokeless tobacco: None  . Alcohol Use: No   OB History    No data available     Review of Systems  Constitutional: Positive for activity change and appetite change. Negative for fever.  HENT: Negative.  Negative for congestion and sore throat.   Eyes:  Negative.   Respiratory: Negative for cough, shortness of breath and wheezing.   Cardiovascular: Negative for chest pain and palpitations.  Gastrointestinal: Positive for abdominal pain and blood in stool. Negative for nausea, vomiting, diarrhea, constipation and abdominal distention.  Genitourinary: Positive for vaginal discharge and pelvic pain. Negative for dysuria, frequency and menstrual problem.  Musculoskeletal: Negative for gait problem and neck pain.       Pain and tenderness across the left anterior and lateral costal margin radiating to the posterior axillary line.  Skin: Negative.   Neurological: Negative for dizziness, facial asymmetry and headaches.  Psychiatric/Behavioral: The patient is nervous/anxious.     Allergies  Review of patient's allergies indicates no known allergies.  Home Medications   Prior to Admission medications   Medication Sig Start Date End Date Taking? Authorizing Provider  metroNIDAZOLE (FLAGYL) 500 MG tablet Take 1 tablet (500 mg total) by mouth 2 (two) times daily. X 7 days 01/29/15   Hayden Rasmussen, NP  pantoprazole (PROTONIX) 40 MG tablet Take 1 tablet (40 mg total) by mouth daily. 01/12/15   Jaclyn Shaggy, MD  sertraline (ZOLOFT) 50 MG tablet  11/12/14   Historical Provider, MD  traMADol (ULTRAM) 50 MG tablet Take 1 tablet (50 mg total) by mouth every 8 (eight) hours as needed. 01/12/15   Jaclyn Shaggy, MD  venlafaxine XR (EFFEXOR-XR) Tracie.5 MG 24 hr capsule  01/01/15   Historical Provider, MD   Meds Ordered and Administered  this Visit   Medications  cefTRIAXone (ROCEPHIN) injection 250 mg (not administered)  azithromycin (ZITHROMAX) tablet 1,000 mg (not administered)    BP 115/74 mmHg  Pulse 99  Temp(Src) 99.1 F (Tracie.3 C) (Oral)  Resp 16  SpO2 100%  LMP 01/17/2015 No data found.   Physical Exam  Constitutional: She is oriented to person, place, and time. She appears well-developed and well-nourished. No distress.  HENT:  Mouth/Throat:  Oropharynx is clear and moist.  Eyes: EOM are normal.  Neck: Normal range of motion. Neck supple.  Cardiovascular: Normal rate, regular rhythm and normal heart sounds.   Pulmonary/Chest: Effort normal and breath sounds normal. No respiratory distress. She has no wheezes. She exhibits tenderness.  There is reproducible, unequivocal tenderness across the left anterior lateral costal margin. Even with light palpation there is tenderness.  Abdominal: Soft. Bowel sounds are normal. She exhibits no distension and no mass. There is tenderness. There is no rebound and no guarding.  There is mild tenderness across the midabdomen and epigastrium as well as across the pelvis. The second examination a few minutes later revealed no tenderness in the epigastrium but tenderness across the lower abdomen and pelvis.  Genitourinary:  Normal external [name] genitalia. There is a quite thin discharge draining over the perineum. Vaginal walls with thin gray to white discharge. Malodorous discharge. Cervix midline. Ectocervix erythematous. Bimanual: Positive for CMT and bilateral adnexal tenderness.  Musculoskeletal: Normal range of motion. She exhibits no edema.  Neurological: She is alert and oriented to person, place, and time.  Skin: Skin is warm and dry.  Psychiatric: She has a normal mood and affect.  Nursing note and vitals reviewed.   ED Course  Procedures (including critical care time)  Labs Review Labs Reviewed  POCT URINALYSIS DIP (DEVICE)  POCT PREGNANCY, URINE  CERVICOVAGINAL ANCILLARY ONLY   Results for orders placed or performed during the hospital encounter of 01/29/15  POCT urinalysis dip (device)  Result Value Ref Range   Glucose, UA NEGATIVE NEGATIVE mg/dL   Bilirubin Urine NEGATIVE NEGATIVE   Ketones, ur NEGATIVE NEGATIVE mg/dL   Specific Gravity, Urine 1.025 1.005 - 1.030   Hgb urine dipstick NEGATIVE NEGATIVE   pH 6.5 5.0 - 8.0   Protein, ur NEGATIVE NEGATIVE mg/dL    Urobilinogen, UA 0.2 0.0 - 1.0 mg/dL   Nitrite NEGATIVE NEGATIVE   Leukocytes, UA NEGATIVE NEGATIVE  Pregnancy, urine POC  Result Value Ref Range   Preg Test, Ur NEGATIVE NEGATIVE    Imaging Review No results found.   Visual Acuity Review  Right Eye Distance:   Left Eye Distance:   Bilateral Distance:    Right Eye Near:   Left Eye Near:    Bilateral Near:         MDM   1. Costochondritis   2. Chronic bilateral lower abdominal pain   3. Pelvic pain in [name]   4. PID (acute pelvic inflammatory disease)   5. Cervicitis   6. BV (bacterial vaginosis)    Patient with chronic intermittent pain to the left lower chest wall for 3 years. She is also having chronic intermittent abdominal pain for which she is being evaluated by her PCP. Recent CT scan of the abdomen was negative for any abnormalities.   She is having pain across the pelvis and suprapubic area now for 2 months. There are no new symptoms today. No exacerbation of old symptoms that more of a continuation. She is having trouble getting a follow-up appointment via phone  with her PCP. She is instructed to go by the office to make the appointment and person.  Findings today consistent with costochondritis of the left lower chest wall. The abdominal exam shows no acute abdomen or worrisome findings. No peritoneal signs.  The tenderness across the pelvis along the pelvic exam suggest PID and bacterial vaginosis, will treat with Rocephin 250 mg IM, azithromycin 1 g by mouth and Flagyl.  She is instructed to take Tylenol for her rib pain and abdominal pain. Do not recommend NSAIDs at this time until etiology of her abdominal pain is ascertained.  As also suggested that she go to her PCPs office to make an follow-up appointment.   Hayden Rasmussen, NP 01/29/15 [phone]

## 2015-01-29 NOTE — Discharge Instructions (Signed)
Dolor abdominal en adultos (Abdominal Pain, Adult) El dolor puede tener muchas causas. Normalmente la causa del dolor abdominal no es una enfermedad y Scientist, clinical (histocompatibility and immunogenetics) sin TEFL teacher. Frecuentemente puede controlarse y tratarse en casa. Su mdico le Medical sales representative examen fsico y posiblemente solicite anlisis de sangre y radiografas para ayudar a Chief Strategy Officer la gravedad de su dolor. Sin embargo, en [employer], debe transcurrir ms tiempo antes de que se pueda Clinical research associate una causa evidente del dolor. Antes de llegar a ese punto, es posible que su mdico no sepa si necesita ms pruebas o un tratamiento ms profundo. INSTRUCCIONES PARA EL CUIDADO EN EL HOGAR  Est atento al dolor para ver si hay cambios. Las siguientes indicaciones ayudarn a Architectural technologist que pueda sentir:  [place] solo medicamentos de venta libre o recetados, segn las indicaciones del mdico.  No tome laxantes a menos que se lo haya indicado su mdico.  Pruebe con Neomia Dear dieta lquida absoluta (caldo, t o agua) segn se lo indique su mdico. Introduzca gradualmente una dieta normal, segn su tolerancia. SOLICITE ATENCIN MDICA SI:  Tiene dolor abdominal sin explicacin.  Tiene dolor abdominal relacionado con nuseas o diarrea.  Tiene dolor cuando orina o defeca.  Experimenta dolor abdominal que lo despierta de noche.  Tiene dolor abdominal que empeora o mejora cuando come alimentos.  Tiene dolor abdominal que empeora cuando come alimentos grasosos.  Tiene fiebre. SOLICITE ATENCIN MDICA DE INMEDIATO SI:   El dolor no desaparece en un plazo mximo de 2horas.  No deja de (vomitar).  El Engineer, mining se siente solo en partes del abdomen, como el lado derecho o la parte inferior izquierda del abdomen.  Evaca materia fecal sanguinolenta o negra, de aspecto alquitranado. ASEGRESE DE QUE:  Comprende estas instrucciones.  Controlar su afeccin.  Recibir ayuda de inmediato si no mejora o si empeora.   Esta  informacin no tiene Theme park manager el consejo del mdico. Asegrese de hacerle al mdico cualquier pregunta que tenga.   Document Released: 03/07/2005 Document Revised: 03/28/2014 Elsevier Interactive Patient Education 2016 [employer].  Cervicitis (Cervicitis) Es el dolor e hinchazn (inflamacin) del cuello uterino. El cuello del tero se encuentra en la parte inferior del tero. Se abre hacia la vagina. CAUSAS   Enfermedades de transmisin sexual (ETS).   Reacciones alrgicas.   Medicamentos o dispositivos anticonceptivos que se Research officer, trade union.   Traumatismo en el cuello del tero.   Infecciones bacterianas.  FACTORES DE RIESGO Usted tendr mayor riesgo de sufrir esta enfermedad si:  Tiene relaciones sexuales sin proteccin.  Ha tenido relaciones sexuales con varias parejas.  Comienza a Management consultant a una edad temprana.  Tiene una historia de ETS. SNTOMAS  Puede ser que no haya sntomas. Si hay sntomas, pueden ser:   Secrecin vaginal de color gris, blanco, amarillo o que tiene mal olor.   Picazn o dolor en la zona externa de la vagina.   Relaciones sexuales dolorosas.   Dolor en la zona baja del abdomen o la espalda, especialmente durante las [employer].   Ganas de orinar con frecuencia.   Sangrado vaginal anormal entre perodos, despus de las relaciones sexuales o despus de la menopausia.   Sensacin de presin o pesadez en la pelvis.  DIAGNSTICO  El diagnstico se realiza mediante un examen plvico. Otras pruebas son:   Examen de las secreciones en el microscopio (preparado fresco).   Papanicolau  TRATAMIENTO  El tratamiento depender de la causa de la cervicitis. Si la causa es  una enfermedad de transmisin sexual, usted y su pareja necesitarn Pensions consultant. Le prescribirn antibiticos.  INSTRUCCIONES PARA EL CUIDADO EN EL HOGAR   No tenga relaciones sexuales hasta que el mdico la  autorice.   No tenga relaciones sexuales hasta que su compaero haya sido tratado si la causa de la cervicitis es una enfermedad de transmisin sexual.   Tome los antibiticos como se le indic. Tmelos todos, aunque se sienta mejor.  SOLICITE ATENCIN MDICA SI:  Los sntomas vuelven a Research officer, trade union.   Tiene fiebre.  ASEGRESE DE QUE:   Comprende estas instrucciones.  Controlar su afeccin.  Recibir ayuda de inmediato si no mejora o si empeora.   Esta informacin no tiene Theme park manager el consejo del mdico. Asegrese de hacerle al mdico cualquier pregunta que tenga.   Document Released: 03/07/2005 Document Revised: 11/07/2012 Elsevier Interactive Patient Education 2016 [employer].  Costocondritis (Costochondritis) La costocondritis a veces llamada sndrome de [place] es la hinchazn e irritacin (inflamacin) del tejido Building surveyor) que une las costillas con el (esternn). Esto causa dolor en el pecho y la zona de las [place]. La costocondritis generalmente desaparece por s misma con el tiempo. Podr tardar Elaina Hoops 6 semanas en curarse o ms, especialmente si no puede restringir sus actividades. CAUSAS  Algunos casos de costocondritis no tienen causa conocida. Las causas posibles son:  [place]han (traumatismos).  Ejercicios o actividades relacionadas con [employer].  Tos intensa. SIGNOS Y SNTOMAS  Dolor y sensibilidad en el rea de las [place].  Dolor que empeora al toser o respirar profundamente.  Dolor que empeora con movimientos especficos. DIAGNSTICO  El mdico le preguntar acerca de sus sntomas y le har un examen fsico. Podr indicarle radiografas para descartar otros problemas. TRATAMIENTO  La costocondritis generalmente desaparece por s misma con el tiempo. El mdico podr recetar algunos medicamentos para Primary school teacher. INSTRUCCIONES PARA EL CUIDADO EN EL HOGAR   Evite la actividad fsica extenuante. Trate de no esforzar las [employer] actividades habituales. Aqu se incluyen las [address] en las que [place] los msculos del pecho, abdominales y los msculos laterales, especialmente si debe levantar peso.  Aplique hielo en la zona afectada durante los primeros 2 das despus del inicio del dolor.  Ponga el hielo en una bolsa plstica.  Colquese una toalla entre la piel y la bolsa de hielo.  Deje el hielo durante 20 minutos, y aplquelo 2-3 veces por Futures trader.  Tome slo medicamentos de venta libre o recetados, segn las indicaciones del mdico. SOLICITE ATENCIN MDICA SI:  Tiene hinchazn o irritacin en las articulaciones de las costillas. Estos son signos de infeccin.  El dolor no desaparece aunque haga reposo o tome medicamentos para Chief Technology Officer. SOLICITE ATENCIN MDICA DE INMEDIATO SI:   El dolor aumenta o siente muchas molestias.  Le falta el aire o tiene dificultad para respirar.  Tose y escupe sangre.  Siente falta de aire o dolor en el pecho, sudoracin o vmitos.  Tiene fiebre o sntomas persistentes durante ms de 2 - 3 das.  Tiene fiebre y los sntomas empeoran repentinamente. ASEGRESE DE QUE:   Comprende estas instrucciones.  Controlar su afeccin.  Recibir ayuda de inmediato si no mejora o si empeora.   Esta informacin no tiene Theme park manager el consejo del mdico. Asegrese de hacerle al mdico cualquier pregunta que tenga.   Document Released: 12/15/2004 Document Revised: 12/26/2012 Elsevier Interactive Patient Education 2016 Elsevier Inc.  Enfermedad plvica inflamatoria (Pelvic Inflammatory Disease) El trmino enfermedad  plvica inflamatoria (EPI) hace referencia a una infeccin en algunos rganos sexuales femeninos o en todos ellos. La infeccin se puede producir en el tero, los ovarios, las trompas de Falopio o los tejidos circundantes de la pelvis. La enfermedad plvica inflamatoria puede causar dolor en la pelvis o en el abdomen que aparece de manera repentina  (dolor plvico agudo). La EPI es una infeccin grave porque puede derivar en dolor plvico prolongado (crnico) o en la imposibilidad de Warehouse manager hijos (esterilidad). CAUSAS La causa ms frecuente de esta enfermedad es una infeccin que se disemina durante el contacto sexual. Sin embargo, la infeccin tambin puede deberse a las bacterias normales que se encuentran en los tejidos vaginales, si estas ascienden [employer] rganos reproductivos. La EPI tambin puede presentarse despus de lo siguiente:  El nacimiento de un beb.  Un aborto espontneo.  Un aborto.  Ciruga plvica mayor.  El uso de un dispositivo intrauterino (DIU).  Neomia Dear agresin sexual. FACTORES DE RIESGO Esta afeccin es ms probable en las mujeres que tienen estas caractersticas:  Son menores de [place].  Son Weston Brass sexualmente a una edad temprana.  Usan mtodos anticonceptivos que no son de barrera.  Tienen muchos compaeros sexuales.  Tienen relaciones sexuales con una persona que presenta sntomas de una ETS (enfermedad de transmisin sexual).  Toman anticonceptivos. En ocasiones, determinadas conductas tambin pueden incrementar la posibilidad de [place] EPI, por ejemplo:  Usar duchas vaginales.  Tener un DIU colocado. SNTOMAS Los sntomas de esta afeccin incluyen lo siguiente:  Dolor plvico o abdominal.  Grant Ruts.  Escalofros.  Flujo vaginal anormal.  Sangrado uterino anormal.  Dolor atpico poco despus de la finalizacin de la [place].  Dolor al Beatrix Shipper.  Dolor durante las [employer].  Nuseas y vmitos. DIAGNSTICO Para diagnosticar esta afeccin, el mdico le har un examen fsico y Neomia Dear historia clnica. Generalmente, el examen plvico revela un gran dolor con la palpacin del tero y de los tejidos plvicos circundantes. Tambin pueden hacerle estudios, por ejemplo:  Anlisis de laboratorio, [employer], [place] prueba de [place], [place] de [place] y de [place].  Pruebas de  cultivo de la vagina y del cuello del tero, a fin de Engineer, manufacturing la presencia de una ETS.  Ecografa.  Un procedimiento laparoscpico para examinar el interior de la pelvis.  Examen de las secreciones vaginales con un microscopio. TRATAMIENTO El tratamiento de esta afeccin puede tener uno o ms abordajes.  Se pueden recetar antibiticos por va oral.  Es posible que haya que tratar a los compaeros sexuales si el factor causante de la infeccin es una ETS.  Cuando los casos revisten mayor gravedad, es posible que haya que hospitalizar a la paciente para administrarle los antibiticos directamente en una vena a travs de una va intravenosa (IV).  Puede ser necesario hacer una ciruga si otros tratamientos no son eficaces, aunque esto es poco frecuente. Pueden pasar semanas hasta que se haya recuperado por completo. Si le diagnostican EPI, tambin deben hacerle estudios de deteccin del virus de inmunodeficiencia humana (VIH). El mdico puede repetirle las pruebas de deteccin de infecciones despus del tratamiento. No debe mantener relaciones sexuales sin proteccin. INSTRUCCIONES PARA EL CUIDADO EN EL HOGAR  Tome los medicamentos de venta libre y los recetados solamente como se lo haya indicado el mdico.  Si le recetaron un antibitico, tmelo como se lo haya indicado el mdico. No deje de tomar los antibiticos aunque comience a Actor.  No tenga relaciones sexuales [employer] tratamiento, o como se  lo haya indicado el mdico. Si se confirma la presencia de EPI, sus compaeros sexuales recientes debern recibir tratamiento, especialmente si las relaciones sexuales fueron sin proteccin.  Concurra a todas las visitas de control como se lo haya indicado el mdico. Esto es importante. SOLICITE ATENCIN MDICA SI:  Aumenta el flujo vaginal o este no es normal.  El dolor no mejora.  Vomita.  Tiene fiebre.  No puede tolerar los medicamentos.  Su pareja tiene  una ETS.  Siente dolor al ConocoPhillipsorinar. SOLICITE ATENCIN MDICA DE INMEDIATO SI:  Aumenta el dolor abdominal o plvico.  Tiene escalofros.  Los sntomas no mejoran despus de 72horas, incluso con Scientist, research (medical)el tratamiento.   Esta informacin no tiene Theme park managercomo fin reemplazar el consejo del mdico. Asegrese de hacerle al mdico cualquier pregunta que tenga.   Document Released: 12/15/2004 Document Revised: 11/26/2014 Elsevier Interactive Patient Education 2016 [employer]vier Inc.  Dolor plvico, mujeres (Pelvic Pain, Female)  Las causa del dolor plvico en la mujer pueden ser muchas y pueden tener su origen en diferentes lugares. El dolor plvico es el que aparece en la mitad inferior del abdomen y De Borgiaentre las caderas. Puede aparecer durante en un perodo corto de tiempo (agudo)o puede ser recurrente (crnico). Esta afeccin puede estar relacionada con trastornos que afectan a los rganos reproductivos femeninos (ginecolgica), pero tambin puede deberse a problemas en la vejiga, clculos renales, complicaciones intestinales, o problemas musculares o esquelticos. Es Garment/textile technologistimportante solicitar ayuda de inmediato, sobre todo si ha sido intenso, [place]udo, o ha aparecido de Wellsite geologistmanera sbita como un dolor inusual. Tambin es importante obtener ayuda de inmediato, ya que algunos tipos de dolor plvico puede poner en peligro la vida.  CAUSAS  A continuacin veremos algunas de las causas del dolor plvico. Las causas pueden clasificarse de diferentes modos.   Ginecolgica.  Enfermedad inflamatoria plvica.  Infecciones de transmisin sexual.  Quiste de ovario o torsin de un ligamento ovrico ( torsin ovrica).  La membrana que recubre internamente al tero desarrollndose fuera del tero (endometriosis).  Fibromas, quistes o tumores.  Ovulacin.  Embarazo.  Embarazo fuera del tero (embarazo ectpico).  Aborto espontneo.  Trabajo de Bismarckparto.  Desprendimiento de la placenta o ruptura del  tero.  Infecciones.  Infeccin uterina (endometritis).  Infeccin de la vejiga.  Diverticulitis.  Aborto relacionado con una infeccin uterina (aborto sptico).  Vejiga.  Inflamacin de la vejiga (cistitis).  Clculos renales.  Gastrointenstinal.  Estreimiento.  Diverticulitis.  Neurolgico.  Traumatismos.  Sentir dolor plvico debido a causas mentales o emocionales (psicosomtico).  Tumores en el intestino o en la pelvis. EVALUACIN  El mdico har una historia clnica detallada segn sus sntomas. Incluir los cambios recientes en su salud, una cuidadosa historia ginecolgica de sus periodos (menstruaciones) y Neomia Dearuna historia de su actividad sexual. Los antecedentes familiares y la historia clnica tambin son importantes. Su mdico podr indicar un examen plvico. El examen plvico ayudar a identificar la ubicacin y la gravedad del Engineer, miningdolor. Tambin ayudar a International Paperevaluar los rganos que pueden estar involucrados. Myrtha Mantis. Para identificar la causa del dolor plvico y tratarlo adecuadamente, el mdico puede indicar estudios. Estas pruebas pueden ser:   Test de embarazo.  Ecografa plvica.  Radiografa del abdomen.  Un anlisis de [place]ina o la evaluacin de la secrecin vaginal.  Anlisis de [place]. INSTRUCCIONES PARA EL CUIDADO EN EL HOGAR   Solo tome medicamentos de venta libre o recetados para Chief Technology Officerel dolor, Dentistmalestar o [place]ocketfiebre, segn las indicaciones del mdico.   Maricela CuretHaga reposo segn las indicaciones del mdico.  Consuma una dieta balanceada.   Beba gran cantidad de lquido para mantener la orina de tono claro o amarillo plido.   Evite las relaciones sexuales, Counsellor.   Aplique compresas calientes o fras en la zona baja del abdomen segn cual le calme el dolor.   Evite las situaciones estresantes.   Lleve un registro del dolor plvico. Anote cundo comenz, dnde se Optometrist y si hay cosas que parecen estar asociadas con el dolor, como algn  alimento o su ciclo menstrual.  Concurra a las visitas de control con el mdico, segn las indicaciones.  SOLICITE ATENCIN MDICA SI:   Los medicamentos no Editor, commissioning.  Tiene flujo vaginal anormal. SOLICITE ATENCIN MDICA DE INMEDIATO SI:   Tiene un sangrado abundante por la vagina.   El dolor plvico aumenta.   Se siente mareada o sufre un desmayo.   Siente escalofros.   Siente dolor intenso al Geographical information systems officer u observa sangre en la orina.   Tiene diarrea o vmitos que no puede controlar.   Tiene fiebre o sntomas que persisten durante ms de [address]  [address]  Tiene fiebre y los sntomas [address].   Ha sido abusada fsica o sexualmente.  ASEGRESE DE QUE:   Comprende estas instrucciones.  Controlar su enfermedad.  Solicitar ayuda de inmediato si no mejora o si empeora.   Esta informacin no tiene Theme park manager el consejo del mdico. Asegrese de hacerle al mdico cualquier pregunta que tenga.   Document Released: 06/03/2008 Document Revised: 07/22/2014 Elsevier Interactive Patient Education [employer]

## 2015-01-29 NOTE — ED Notes (Signed)
Patient received an antibiotic injection.  Patient will be delayed leaving until post injection delay is complete.  Observation for any adverse respones

## 2015-01-30 LAB — CERVICOVAGINAL ANCILLARY ONLY
CHLAMYDIA, DNA PROBE: NEGATIVE
NEISSERIA GONORRHEA: NEGATIVE

## 2015-02-02 ENCOUNTER — Encounter (HOSPITAL_COMMUNITY): Payer: Self-pay | Admitting: *Deleted

## 2015-02-02 LAB — CERVICOVAGINAL ANCILLARY ONLY: Wet Prep (BD Affirm): NEGATIVE

## 2015-02-02 NOTE — ED Notes (Signed)
GC, chlamydia, gardnerella, yeast, trich negative

## 2015-02-05 ENCOUNTER — Ambulatory Visit: Payer: Self-pay | Admitting: Internal Medicine

## 2015-02-19 ENCOUNTER — Encounter: Payer: Self-pay | Admitting: Internal Medicine

## 2015-02-19 ENCOUNTER — Ambulatory Visit: Payer: Self-pay | Attending: Internal Medicine | Admitting: Internal Medicine

## 2015-02-19 VITALS — BP 111/71 | HR 95 | Temp 98.3°F | Resp 18 | Ht 64.0 in | Wt 164.0 lb

## 2015-02-19 DIAGNOSIS — R195 Other fecal abnormalities: Secondary | ICD-10-CM

## 2015-02-19 DIAGNOSIS — R1031 Right lower quadrant pain: Secondary | ICD-10-CM

## 2015-02-19 DIAGNOSIS — Z0000 Encounter for general adult medical examination without abnormal findings: Secondary | ICD-10-CM

## 2015-02-19 DIAGNOSIS — R51 Headache: Secondary | ICD-10-CM | POA: Insufficient documentation

## 2015-02-19 DIAGNOSIS — F419 Anxiety disorder, unspecified: Secondary | ICD-10-CM | POA: Insufficient documentation

## 2015-02-19 MED ORDER — METRONIDAZOLE 500 MG PO TABS: 500.0000 mg | ORAL_TABLET | Freq: Two times a day (BID) | ORAL | Status: DC

## 2015-02-19 MED ORDER — CIPROFLOXACIN HCL 500 MG PO TABS: 500.0000 mg | ORAL_TABLET | Freq: Two times a day (BID) | ORAL | Status: DC

## 2015-02-19 NOTE — Patient Instructions (Signed)
Dolor abdominal en adultos °(Abdominal Pain, Adult) °El dolor puede tener muchas causas. Normalmente la causa del dolor abdominal no es una enfermedad y mejorará sin tratamiento. Frecuentemente puede controlarse y tratarse en casa. Su médico le realizará un examen físico y posiblemente solicite análisis de sangre y radiografías para ayudar a determinar la gravedad de su dolor. Sin embargo, en muchos casos, debe transcurrir más tiempo antes de que se pueda encontrar una causa evidente del dolor. Antes de llegar a ese punto, es posible que su médico no sepa si necesita más pruebas o un tratamiento más profundo. °INSTRUCCIONES PARA EL CUIDADO EN EL HOGAR  °Esté atento al dolor para ver si hay cambios. Las siguientes indicaciones ayudarán a aliviar cualquier molestia que pueda sentir: °· Tome solo medicamentos de venta libre o recetados, según las indicaciones del médico. °· No tome laxantes a menos que se lo haya indicado su médico. °· Pruebe con una dieta líquida absoluta (caldo, té o agua) según se lo indique su médico. Introduzca gradualmente una dieta normal, según su tolerancia. °SOLICITE ATENCIÓN MÉDICA SI: °· Tiene dolor abdominal sin explicación. °· Tiene dolor abdominal relacionado con náuseas o diarrea. °· Tiene dolor cuando orina o defeca. °· Experimenta dolor abdominal que lo despierta de noche. °· Tiene dolor abdominal que empeora o mejora cuando come alimentos. °· Tiene dolor abdominal que empeora cuando come alimentos grasosos. °· Tiene fiebre. °SOLICITE ATENCIÓN MÉDICA DE INMEDIATO SI:  °· El dolor no desaparece en un plazo máximo de 2 horas. °· No deja de (vomitar). °· El dolor se siente solo en partes del abdomen, como el lado derecho o la parte inferior izquierda del abdomen. °· Evacúa materia fecal sanguinolenta o negra, de aspecto alquitranado. °ASEGÚRESE DE QUE: °· Comprende estas instrucciones. °· Controlará su afección. °· Recibirá ayuda de inmediato si no mejora o si empeora. °  °Esta  información no tiene como fin reemplazar el consejo del médico. Asegúrese de hacerle al médico cualquier pregunta que tenga. °  °Document Released: 03/07/2005 Document Revised: 03/28/2014 °Elsevier Interactive Patient Education ©2016 Elsevier Inc. ° °

## 2015-02-19 NOTE — Progress Notes (Signed)
Patient ID: Tracie [name], female   DOB: 09/15/1980, 34 y.o.   MRN: [id number]   Tracie [name], is a 34 y.o. female  [id number]  [id number]  DOB - 09/15/1980  Chief Complaint  Patient presents with  . Abdominal Pain        Subjective:   Tracie [name] is a 34 y.o. female here today for a follow up visit. She is complaining of pain to the left lower abdomen and left mid back. She claims she also had some discharge when she urinates but not from her vagina. She has recently been seen in the ED for the same symptom, I have also seen her severally for various nonspecific symptoms including left-sided abdominal pain, abdominal CT done on 01/19/2005 was normal. She has no abdominal distention, no nausea or vomiting associated, no constipation but she claims she has mucoid stool occasionally. Of note patient has PTSD with anxiety and depression, currently seeing a psychologist, placed on Effexor XL, dosage has been increased to 75 mg by mouth daily. Patient has not seen significant improvement in her situation but with this recent increase in dosage, she may benefit. She has no fever. [place]. Patient has No headache, No chest pain, No new weakness tingling or numbness, No Cough - SOB.  Problem  Health Care Maintenance  Right Lower Quadrant Pain  Stool Mucus    ALLERGIES: No Known Allergies  PAST MEDICAL HISTORY: Past Medical History  Diagnosis Date  . Headache     MEDICATIONS AT HOME: Prior to Admission medications   Medication Sig Start Date End Date Taking? Authorizing Provider  hydrOXYzine (ATARAX/VISTARIL) 10 MG tablet Take 10 mg by mouth 3 (three) times daily as needed for anxiety.   Yes Historical Provider, MD  venlafaxine XR (EFFEXOR-XR) 37.5 MG 24 hr capsule  01/01/15  Yes Historical Provider, MD  ciprofloxacin (CIPRO) 500 MG tablet Take 1 tablet (500 mg total) by mouth 2 (two) times daily. 02/19/15   Quentin Angstlugbemiga E  Charlisha Market, MD  metroNIDAZOLE (FLAGYL) 500 MG tablet Take 1 tablet (500 mg total) by mouth 2 (two) times daily. X 7 days 02/19/15   Quentin Angstlugbemiga E Worthington Cruzan, MD  pantoprazole (PROTONIX) 40 MG tablet Take 1 tablet (40 mg total) by mouth daily. Patient not taking: Reported on 02/19/2015 01/12/15   Jaclyn ShaggyEnobong Amao, MD  sertraline (ZOLOFT) 50 MG tablet  11/12/14   Historical Provider, MD  traMADol (ULTRAM) 50 MG tablet Take 1 tablet (50 mg total) by mouth every 8 (eight) hours as needed. Patient not taking: Reported on 02/19/2015 01/12/15   Jaclyn ShaggyEnobong Amao, MD     Objective:   Filed Vitals:   02/19/15 1216  BP: 111/71  Pulse: 95  Temp: 98.3 F (36.8 C)  TempSrc: Oral  Resp: 18  Height: 5\' 4"  (1.626 m)  Weight: 164 lb (74.39 kg)  SpO2: 99%    Exam General appearance : Awake, alert, not in any distress. Speech Clear. Not toxic looking HEENT: Atraumatic and Normocephalic, pupils equally reactive to light and accomodation Neck: supple, no JVD. No cervical lymphadenopathy.  Chest:Good air entry bilaterally, no added sounds  CVS: S1 S2 regular, no murmurs.  Abdomen: Bowel sounds present, Non tender and not distended with no gaurding, rigidity or rebound. Extremities: B/L Lower Ext shows no edema, both legs are warm to touch Neurology: Awake alert, and oriented X 3, CN II-XII intact, Non focal Skin:No Rash  Data Review Lab Results  Component Value Date   HGBA1C 5.5  09/09/2013     Assessment & Plan   1. Health care maintenance  - Flu Vaccine QUAD 36+ mos PF IM (Fluarix & Fluzone Quad PF)  2. Right lower quadrant pain  - Urinalysis, Complete  3. Stool mucus  - metroNIDAZOLE (FLAGYL) 500 MG tablet; Take 1 tablet (500 mg total) by mouth 2 (two) times daily. X 7 days  Dispense: 10 tablet; Refill: 0 - ciprofloxacin (CIPRO) 500 MG tablet; Take 1 tablet (500 mg total) by mouth 2 (two) times daily.  Dispense: 10 tablet; Refill: 0 - Cryptosporidium Antigen, Stool - Giardia antigen - Ova and  Parasite Examination - Fecal occult blood, imunochemical - Fecal leukocytes  Patient have been counseled extensively about nutrition and exercise  Interpreter was used to communicate directly with patient for the entire encounter including providing detailed patient instructions.   Return in about 6 months (around 08/20/2015) for Follow up Pain and comorbidities, Annual Physical.  The patient was given clear instructions to go to ER or return to medical center if symptoms don't improve, worsen or new problems develop. The patient verbalized understanding. The patient was told to call to get lab results if they haven't heard anything in the next week.   This note has been created with Education officer, environmental. Any transcriptional errors are unintentional.    Jeanann Lewandowsky, MD, MHA, FACP, FAAP, CPE [facility] [place], [place] [phone]   02/19/2015, 12:59 PM

## 2015-02-19 NOTE — Progress Notes (Signed)
Patient complains of severe pain in stomach. Patient went to urgent care and they gave her medication for infection. Patient is still having stomach pain. Patient sees discharge in urine. About 3 months ago, patient could see mucus and blood in stool. Last saw blood and mucus in stools 2 weeks. Stool described as yellowish currently.   Patient describes LUQ abdominal pain, at level 6, described as burning. Patient describes right, upper back pain, at level 8, patient can not describe pain in back, patient explains pain starts in front and leads to back. Stomach pain has been going on 4 months, back pain has been there longer.

## 2015-02-20 LAB — URINALYSIS, COMPLETE
Bacteria, UA: NONE SEEN [HPF]
Bilirubin Urine: NEGATIVE
CASTS: NONE SEEN [LPF]
CRYSTALS: NONE SEEN [HPF]
Glucose, UA: NEGATIVE
Hgb urine dipstick: NEGATIVE
Ketones, ur: NEGATIVE
Nitrite: NEGATIVE
Protein, ur: NEGATIVE
SPECIFIC GRAVITY, URINE: 1.007 (ref 1.001–1.035)
Yeast: NONE SEEN [HPF]
pH: 6.5 (ref 5.0–8.0)

## 2015-02-25 ENCOUNTER — Telehealth: Payer: Self-pay | Admitting: *Deleted

## 2015-02-25 NOTE — Telephone Encounter (Signed)
-----   Message from Quentin Angstlugbemiga E Jegede, MD sent at 02/20/2015  6:06 PM EST ----- Urine analysis is normal. Awaiting stool test

## 2015-02-25 NOTE — Telephone Encounter (Signed)
Medical Assistant used [employer] to contact patient.  Interpreter Name:Diana Interpreter #: [phone]  Medical Assistant left message on patient's home and cell voicemail. Voicemail states to give a call back to [place] [facility] at [phone].

## 2015-02-27 NOTE — Telephone Encounter (Signed)
Patient returned nurse phone call.

## 2015-03-09 NOTE — Telephone Encounter (Signed)
Medical Assistant used [employer] to contact patient.  Interpreter Name: Johnathan Interpreter #: [id number] Medical Assistant left message on patient's home and cell voicemail. Voicemail states to give a call back to Xoey Warmoth with [facility] at [phone].   

## 2015-03-23 MED FILL — VENLAFAXINE HCL ER 75 MG CA: 75 | 30 days supply | Qty: 30 | Fill #1

## 2015-04-08 MED FILL — VENLAFAXINE HCL ER 37.5 MG: 37.5 | 30 days supply | Qty: 30 | Fill #0

## 2015-04-08 MED FILL — ?BUPROPION HCL XL 150 MG TA: 150 | 30 days supply | Qty: 30 | Fill #0

## 2015-05-21 MED FILL — hydrOXYzine HCL 10 MG TABS: 10 | 30 days supply | Qty: 90 | Fill #3

## 2015-05-26 MED FILL — ?BUPROPION HCL XL 150 MG TA: 150 | 30 days supply | Qty: 30 | Fill #1

## 2015-06-11 ENCOUNTER — Ambulatory Visit: Payer: Self-pay | Attending: Internal Medicine | Admitting: Internal Medicine

## 2015-06-11 ENCOUNTER — Encounter: Payer: Self-pay | Admitting: Internal Medicine

## 2015-06-11 VITALS — BP 123/85 | HR 84 | Temp 98.4°F | Resp 18 | Ht 65.0 in | Wt 165.8 lb

## 2015-06-11 DIAGNOSIS — Z79899 Other long term (current) drug therapy: Secondary | ICD-10-CM | POA: Insufficient documentation

## 2015-06-11 DIAGNOSIS — R42 Dizziness and giddiness: Secondary | ICD-10-CM

## 2015-06-11 DIAGNOSIS — F4310 Post-traumatic stress disorder, unspecified: Secondary | ICD-10-CM | POA: Insufficient documentation

## 2015-06-11 DIAGNOSIS — R51 Headache: Secondary | ICD-10-CM | POA: Insufficient documentation

## 2015-06-11 MED ORDER — MECLIZINE HCL 25 MG PO TABS: 25.0000 mg | ORAL_TABLET | Freq: Two times a day (BID) | ORAL | Status: DC | PRN

## 2015-06-11 NOTE — Progress Notes (Signed)
Patient is here for Dizziness and HA  Patient complains of HA being present for 3 months along with Dizziness. Patient complains of HA being described as pins and needles in the back of her head. Pain is scaled currently at a 6. Patient complains of her eyes "shivering" though when looking in the mirror she can not see her eye balls moving.

## 2015-06-11 NOTE — Patient Instructions (Signed)
Mareos (Dizziness) Los mareos son un problema muy frecuente. Se trata de una sensacin de inestabilidad o de desvanecimiento. Puede sentir que se va a desmayar. Un mareo puede provocarle una lesin si se tropieza o se cae. Las personas de todas las edades pueden sufrir mareos, pero es ms frecuente en los adultos mayores. Esta afeccin puede tener muchas causas, entre las que se pueden mencionar los medicamentos, la deshidratacin y las enfermedades. INSTRUCCIONES PARA EL CUIDADO EN EL HOGAR Estas indicaciones pueden ayudarlo con el trastorno: Comida y bebida  Beba suficiente lquido para mantener la orina clara o de color amarillo plido. Esto evita la deshidratacin. Trate de beber ms lquidos transparentes, como agua.  No beba alcohol.  Limite el consumo de cafena si el mdico se lo indica.  Limite el consumo de sal si el mdico se lo indica. Actividad  Evite los movimientos rpidos.  Levntese de las sillas con lentitud y apyese hasta sentirse bien.  Por la maana, sintese primero a un lado de la cama. Cuando se sienta bien, pngase lentamente de pie mientras se sostiene de algo, hasta que sepa que ha logrado el equilibrio.  Mueva las piernas con frecuencia si debe estar de pie en un lugar durante mucho tiempo. Mientras est de pie, contraiga y relaje los msculos de las piernas.  No conduzca vehculos ni opere maquinaria pesada si se siente mareado.  Evite agacharse si se siente mareado. En su casa, coloque los objetos de modo que le resulte fcil alcanzarlos sin agacharse. Estilo de vida  No consuma ningn producto que contenga tabaco, lo que incluye cigarrillos, tabaco de mascar o cigarrillos electrnicos. Si necesita ayuda para dejar de fumar, consulte al mdico.  Trate de reducir el nivel de estrs practicando actividades como el yoga o la meditacin. Hable con el mdico si necesita ayuda. Instrucciones generales  Controle sus mareos para ver si hay cambios.  Tome los  medicamentos solamente como se lo haya indicado el mdico. Hable con el mdico si cree que los medicamentos que est tomando son la causa de sus mareos.  Infrmele a un amigo o a un familiar si se siente mareado. Pdale a esta persona que llame al mdico si observa cambios en su comportamiento.  Concurra a todas las visitas de control como se lo haya indicado el mdico. Esto es importante. SOLICITE ATENCIN MDICA SI:  Los mareos persisten.  Los mareos o la sensacin de desvanecimiento empeoran.  Siente nuseas.  Ha perdido la audicin.  Aparecen nuevos sntomas.  Cuando est de pie se siente inestable o que la habitacin da vueltas. SOLICITE ATENCIN MDICA DE INMEDIATO SI:  Vomita o tiene diarrea y no puede comer ni beber nada.  Tiene dificultad para hablar, para caminar, para tragar o para usar los brazos, las manos o las piernas.  Siente una debilidad generalizada.  No piensa con claridad o tiene dificultades para armar oraciones. Es posible que un amigo o un familiar adviertan que esto ocurre.  Tiene dolor de pecho, dolor abdominal, sudoracin o le falta el aire.  Hay cambios en la visin.  Observa un sangrado.  Tiene dolores de cabeza.  Tiene dolor o rigidez en el cuello.  Tiene fiebre.   Esta informacin no tiene como fin reemplazar el consejo del mdico. Asegrese de hacerle al mdico cualquier pregunta que tenga.   Document Released: 03/07/2005 Document Revised: 07/22/2014 Elsevier Interactive Patient Education 2016 Elsevier Inc.  

## 2015-06-11 NOTE — Progress Notes (Signed)
Patient ID: Tracie [name], female   DOB: December 30, 1980, 35 y.o.   MRN: [id number]   Tracie [name], is a 35 y.o. female  [id number]  [id number]  DOB - December 30, 1980  Chief Complaint  Patient presents with  . Dizziness        Subjective:   Tracie [name] is a 35 y.o. female here today for a follow up visit. Patient complains of dizziness that has been present for about 3 months associated with headaches. Patient complains that her headache feels like pins and needles in the back of her head. Dizziness is occasional. No fainting attacks. No history of fall. Pain is scaled currently at a 6/10. Patient complains of her eyes "shivering" though when looking in the mirror she can not see her eye balls moving. Patient has multiple non-specific but bodily complaints. But she has no chest pain, No abdominal pain - No Nausea, No new weakness tingling or numbness, No Cough - SOB. Patient has PTSD with anxiety and depression, currently seeing a psychologist, placed on Effexor XL 75 mg by mouth daily.  Problem  Dizziness    ALLERGIES: No Known Allergies  PAST MEDICAL HISTORY: Past Medical History  Diagnosis Date  . Headache     MEDICATIONS AT HOME: Prior to Admission medications   Medication Sig Start Date End Date Taking? Authorizing Provider  hydrOXYzine (ATARAX/VISTARIL) 10 MG tablet Take 10 mg by mouth 3 (three) times daily as needed for anxiety.   Yes Historical Provider, MD  meclizine (ANTIVERT) 25 MG tablet Take 1 tablet (25 mg total) by mouth 2 (two) times daily as needed for dizziness. 06/11/15   Quentin Angstlugbemiga E Marvel Mcphillips, MD  pantoprazole (PROTONIX) 40 MG tablet Take 1 tablet (40 mg total) by mouth daily. Patient not taking: Reported on 02/19/2015 01/12/15   Jaclyn ShaggyEnobong Amao, MD     Objective:   Filed Vitals:   06/11/15 1732  BP: 123/85  Pulse: 84  Temp: 98.4 F (36.9 C)  TempSrc: Oral  Resp: 18  Height: 5\' 5"  (1.651 m)  Weight: 165 lb 12.8 oz (75.206 kg)    SpO2: 100%    Exam General appearance : Awake, alert, not in any distress. Speech Clear. Not toxic looking HEENT: Atraumatic and Normocephalic, pupils equally reactive to light and accomodation Neck: supple, no JVD. No cervical lymphadenopathy.  Chest:Good air entry bilaterally, no added sounds  CVS: S1 S2 regular, no murmurs.  Abdomen: Bowel sounds present, Non tender and not distended with no gaurding, rigidity or rebound. Extremities: B/L Lower Ext shows no edema, both legs are warm to touch Neurology: Awake alert, and oriented X 3, CN II-XII intact, Non focal Skin: No Rash  Data Review Lab Results  Component Value Date   HGBA1C 5.5 09/09/2013     Assessment & Plan   1. Dizziness  - meclizine (ANTIVERT) 25 MG tablet; Take 1 tablet (25 mg total) by mouth 2 (two) times daily as needed for dizziness.  Dispense: 60 tablet; Refill: 0  Patient have been counseled extensively about nutrition and exercise  Interpreter was used to communicate directly with patient for the entire encounter including providing detailed patient instructions.  Return if symptoms worsen or fail to improve, for Routine Follow Up.  The patient was given clear instructions to go to ER or return to medical center if symptoms don't improve, worsen or new problems develop. The patient verbalized understanding. The patient was told to call to get lab results if they haven't heard anything in the next week.  This note has been created with Education officer, environmental. Any transcriptional errors are unintentional.    Jeanann Lewandowsky, MD, MHA, FACP, FAAP, CPE [facility] [place], [place] [phone]   06/11/2015, 6:00 PM

## 2015-06-17 ENCOUNTER — Ambulatory Visit: Payer: Self-pay

## 2015-06-24 ENCOUNTER — Ambulatory Visit: Payer: Self-pay | Attending: Internal Medicine

## 2015-06-25 MED FILL — ?CITALOPRAM HBR 10 MG TABLE: 10 | 30 days supply | Qty: 30 | Fill #0

## 2015-06-25 MED FILL — ?BUPROPION HCL SR 100 MG TA: 100 | 30 days supply | Qty: 30 | Fill #0

## 2015-06-25 MED FILL — HYDROXYZINE PAM 25 MG CAP: 25 | 30 days supply | Qty: 90 | Fill #0

## 2015-07-02 ENCOUNTER — Ambulatory Visit: Payer: Self-pay | Admitting: Internal Medicine

## 2015-07-21 ENCOUNTER — Telehealth: Payer: Self-pay | Admitting: Internal Medicine

## 2015-07-21 NOTE — Telephone Encounter (Signed)
Spanish Pt is requesting a refill of citalotram  10mg  . Pt said that Dr Hyman HopesJegede suppose to refill before . [facility]

## 2015-07-28 MED FILL — ?CITALOPRAM HBR 10 MG TABLE: 10 | 30 days supply | Qty: 30 | Fill #1

## 2015-07-30 ENCOUNTER — Encounter: Payer: Self-pay | Admitting: Physician Assistant

## 2015-07-30 ENCOUNTER — Ambulatory Visit: Payer: Self-pay | Attending: Internal Medicine | Admitting: Physician Assistant

## 2015-07-30 VITALS — BP 107/66 | HR 91 | Temp 98.4°F | Wt 167.4 lb

## 2015-07-30 DIAGNOSIS — R1013 Epigastric pain: Secondary | ICD-10-CM | POA: Insufficient documentation

## 2015-07-30 DIAGNOSIS — K921 Melena: Secondary | ICD-10-CM | POA: Insufficient documentation

## 2015-07-30 DIAGNOSIS — Z79899 Other long term (current) drug therapy: Secondary | ICD-10-CM | POA: Insufficient documentation

## 2015-07-30 LAB — COMPREHENSIVE METABOLIC PANEL
ALT: 11 U/L (ref 6–29)
AST: 12 U/L (ref 10–30)
Albumin: 4.3 g/dL (ref 3.6–5.1)
Alkaline Phosphatase: 46 U/L (ref 33–115)
BUN: 8 mg/dL (ref 7–25)
CALCIUM: 9 mg/dL (ref 8.6–10.2)
CO2: 24 mmol/L (ref 20–31)
Chloride: 102 mmol/L (ref 98–110)
Creat: 0.76 mg/dL (ref 0.50–1.10)
GLUCOSE: 85 mg/dL (ref 65–99)
POTASSIUM: 3.9 mmol/L (ref 3.5–5.3)
Sodium: 134 mmol/L — ABNORMAL LOW (ref 135–146)
Total Bilirubin: 0.2 mg/dL (ref 0.2–1.2)
Total Protein: 6.7 g/dL (ref 6.1–8.1)

## 2015-07-30 LAB — POCT URINE PREGNANCY: Preg Test, Ur: NEGATIVE

## 2015-07-30 LAB — POCT URINALYSIS DIPSTICK
BILIRUBIN UA: NEGATIVE
GLUCOSE UA: NEGATIVE
Ketones, UA: NEGATIVE
NITRITE UA: NEGATIVE
Protein, UA: NEGATIVE
Spec Grav, UA: 1.01
Urobilinogen, UA: 0.2
pH, UA: 6.5

## 2015-07-30 LAB — CBC WITH DIFFERENTIAL/PLATELET
Basophils Absolute: 0 cells/uL (ref 0–200)
Basophils Relative: 0 %
Eosinophils Absolute: 152 cells/uL (ref 15–500)
Eosinophils Relative: 2 %
HEMATOCRIT: 32.5 % — AB (ref 35.0–45.0)
Hemoglobin: 10.8 g/dL — ABNORMAL LOW (ref 11.7–15.5)
LYMPHS PCT: 30 %
Lymphs Abs: 2280 cells/uL (ref 850–3900)
MCH: 25.5 pg — ABNORMAL LOW (ref 27.0–33.0)
MCHC: 33.2 g/dL (ref 32.0–36.0)
MCV: 76.8 fL — AB (ref 80.0–100.0)
MONO ABS: 684 {cells}/uL (ref 200–950)
MONOS PCT: 9 %
MPV: 10.3 fL (ref 7.5–12.5)
NEUTROS PCT: 59 %
Neutro Abs: 4484 cells/uL (ref 1500–7800)
PLATELETS: 356 10*3/uL (ref 140–400)
RBC: 4.23 MIL/uL (ref 3.80–5.10)
RDW: 14.7 % (ref 11.0–15.0)
WBC: 7.6 10*3/uL (ref 3.8–10.8)

## 2015-07-30 LAB — HEMOCCULT GUIAC POC 1CARD (OFFICE): FECAL OCCULT BLD: NEGATIVE

## 2015-07-30 NOTE — Patient Instructions (Signed)
Opciones de alimentos para pacientes con reflujo gastroesofágico - Adultos  (Food Choices for Gastroesophageal Reflux Disease, Adult)  Cuando se tiene reflujo gastroesofágico (ERGE), los alimentos que se ingieren y los hábitos de alimentación son muy importantes. Elegir los alimentos adecuados puede ayudar a aliviar las molestias ocasionadas por el ERGE.  ¿QUÉ PAUTAS GENERALES DEBO SEGUIR?  · Elija las frutas, los vegetales, los cereales integrales, los productos lácteos, la carne de vaca, de pescado y de ave con bajo contenido de grasas.  · Limite las grasas, como los aceites, los aderezos para ensalada, la manteca, los frutos secos y el aguacate.  · Lleve un registro de las comidas para identificar los alimentos que ocasionan síntomas.  · Evite los alimentos que le ocasionen reflujo. Pueden ser distintos para cada persona.  · Haga comidas pequeñas con frecuencia en lugar de tres comidas abundantes todos los días.  · Coma lentamente, en un clima distendido.  · Limite el consumo de alimentos fritos.  · Cocine los alimentos utilizando métodos que no sean la fritura.  · Evite el consumo alcohol.  · Evite beber grandes cantidades de líquidos con las comidas.  · Evite agacharse o recostarse hasta después de 2 o 3 horas de haber comido.  ¿QUÉ ALIMENTOS NO SE RECOMIENDAN?  Los siguientes son algunos alimentos y bebidas que pueden empeorar los síntomas:  Vegetales  Tomates. Jugo de tomate. Salsa de tomate y espagueti. Ajíes. Cebolla y ajo. Rábano picante.  Frutas  Naranjas, pomelos y limón (fruta y jugo).  Carnes  Carnes de vaca, de pescado y de ave con gran contenido de grasas. Esto incluye los perros calientes, las costillas, el jamón, la salchicha, el salame y el tocino.  Lácteos  Leche entera y leche chocolatada. Crema ácida. Crema. Mantequilla. Helados. Queso crema.   Bebidas  Café y té negro, con o sin cafeína Bebidas gaseosas o energizantes.  Condimentos  Salsa picante. Salsa barbacoa.   Dulces/postres  Chocolate y  cacao. Rosquillas. Menta y mentol.  Grasas y aceites  Alimentos con alto contenido de grasas, incluidas las papas fritas.  Otros  Vinagre. Especias picantes, como la pimienta negra, la pimienta blanca, la pimienta roja, la pimienta de cayena, el curry en polvo, los clavos de olor, el jengibre y el chile en polvo.  Los artículos mencionados arriba pueden no ser una lista completa de las bebidas y los alimentos que se deben evitar. Comuníquese con el nutricionista para recibir más información.     Esta información no tiene como fin reemplazar el consejo del médico. Asegúrese de hacerle al médico cualquier pregunta que tenga.     Document Released: 12/15/2004 Document Revised: 03/28/2014  Elsevier Interactive Patient Education ©2016 Elsevier Inc.

## 2015-07-30 NOTE — Progress Notes (Signed)
Patient ID: Tracie [name], female   DOB: 10/04/1980, 35 y.o.   MRN: [id number]   Tracie [name], is a 35 y.o. female  [id number]  [id number]  DOB - 10/04/1980  Chief Complaint  Patient presents with  . Blood In Stools    x yesterday  Bright red  . Abdominal Pain    middle x 2 weeks        Subjective:  Chief Complaint and HPI: Tracie [name] is a 35 y.o. female here for 2 week history of midepigastric abdominal pain.  Yesterday when she had a BM, she noticed bright red blood on the toilet paper when she wiped.  There may have been a small amount of mucus. She has not had diarrhea or been constipated.  Her appetite is normal.  The abdominal pain comes and goes-possibly worsens after eating but she isn't sure that eating changes it.  The pain is cramping in nature. It is mild to moderate in intensity. No f/c.   The pain is unrelieved with TUMS or pantoprazole.  She denies urinary s/sx. No pelvic pain or vaginal d/c.  Interpreter "Vernona RiegerLaura" through [employer].   LMP 07/05/2015.  Using condoms for birth control.   ROS:   Constitutional:  No f/c, No night sweats, No unexplained weight loss. EENT:  No vision changes, No blurry vision, No hearing changes. No mouth, throat, or ear problems.  Respiratory: No cough, No SOB Cardiac: No CP, no palpitations GI:  +abd pain, No N/V/D/C. GU: No Urinary s/sx Musculoskeletal: No joint pain Neuro: No headache, no dizziness, no motor weakness.  Skin: No rash Endocrine:  No polydipsia. No polyuria.  Psych: Denies SI/HI  ALLERGIES: No Known Allergies  PAST MEDICAL HISTORY: Past Medical History  Diagnosis Date  . Headache     MEDICATIONS AT HOME: Prior to Admission medications   Medication Sig Start Date End Date Taking? Authorizing Provider  citalopram (CELEXA) 10 MG tablet Take 10 mg by mouth daily.   Yes Historical Provider, MD  hydrOXYzine (ATARAX/VISTARIL) 10 MG tablet Take 10 mg by mouth 3 (three) times daily as  needed for anxiety.   Yes Historical Provider, MD  pantoprazole (PROTONIX) 40 MG tablet Take 1 tablet (40 mg total) by mouth daily. 01/12/15  Yes Jaclyn ShaggyEnobong Amao, MD     Objective:  EXAMCeasar Mons:   Filed Vitals:   07/30/15 1440  BP: 107/66  Pulse: 91  Temp: 98.4 F (36.9 C)  TempSrc: Oral  Weight: 167 lb 6.4 oz (75.932 kg)    General appearance : A&OX3. NAD. Non-toxic-appearing HEENT: Atraumatic and Normocephalic.  PERRLA. EOM intact.  TM clear B. Mouth-MMM, post pharynx WNL w/o erythema, No PND. Neck: supple, no JVD. No cervical lymphadenopathy. No thyromegaly Chest/Lungs:  Breathing-non-labored, Good air entry bilaterally, breath sounds normal without rales, rhonchi, or wheezing  CVS: S1 S2 regular, no murmurs, gallops, rubs  Abdomen: Bowel sounds present, Non tender and not distended with no gaurding, rigidity or rebound. No ascites/fluid wave. Neg Murphy's; neg McBurney's.  Mild TTP over midepidgastric area. Rectal: No external fissures or hemorrhoids.  DRE:  Normal sphincter tone.  No palpable masses.  Rectal tissue WNL.  Heme neg.  Extremities: Bilateral Lower Ext shows no edema, both legs are warm to touch with = pulse throughout Neurology:  CN II-XII grossly intact, Non focal.   Psych:  TP linear. J/I WNL. Normal speech. Appropriate eye contact and affect.  Skin:  No Rash  Data Review Lab Results  Component Value Date   HGBA1C 5.5  09/09/2013     Assessment & Plan   1. Hematochezia X 1 episode with neg hemoccult X 1 in office.  - CBC with Differential/Platelet - Occult blood card to lab, stool; Standing - Occult blood card to lab, stool - Hemoccult - 1 Card (office) - Urine culture -Increase fiber and water intake  2. Midepigastric pain Continue pantoprazole daily.  - Comprehensive metabolic panel - H. pylori breath test - POCT urinalysis dipstick-small leukocytes-increase water intake.  This may also help with bowel problem.   - POCT urine pregnancy - Urine  culture  Patient have been counseled extensively about nutrition and exercise  F/up 2-3 weeks or predicated on labs  The patient was given clear instructions to go to ER or return to medical center if symptoms don't improve, worsen or new problems develop. The patient verbalized understanding. The patient was told to call to get lab results if they haven't heard anything in the next week.    Georgian Co, PA-C [facility] [place], [place] [phone]   07/30/2015, 4:14 PM

## 2015-07-31 LAB — H. PYLORI BREATH TEST: H. PYLORI BREATH TEST: NOT DETECTED

## 2015-08-01 LAB — URINE CULTURE

## 2015-08-03 ENCOUNTER — Other Ambulatory Visit: Payer: Self-pay | Admitting: Physician Assistant

## 2015-08-03 MED ORDER — CEPHALEXIN 500 MG PO CAPS: 500.0000 mg | ORAL_CAPSULE | Freq: Two times a day (BID) | ORAL | Status: DC

## 2015-08-03 MED ORDER — FERROUS FUMARATE 325 (106 FE) MG PO TABS: 1.0000 | ORAL_TABLET | Freq: Every day | ORAL | Status: DC

## 2015-08-03 MED FILL — CEPHALEXIN 500 MG CAPSULE: 500 | 5 days supply | Qty: 10 | Fill #0

## 2015-08-03 MED FILL — FERROUS SULFATE 325 MG TAB: 325 (65 FE) | 30 days supply | Qty: 30 | Fill #0

## 2015-08-06 ENCOUNTER — Ambulatory Visit: Payer: Self-pay | Attending: Internal Medicine

## 2015-08-06 ENCOUNTER — Other Ambulatory Visit: Payer: Self-pay | Admitting: Pharmacist

## 2015-08-06 DIAGNOSIS — K921 Melena: Secondary | ICD-10-CM | POA: Insufficient documentation

## 2015-08-06 DIAGNOSIS — D649 Anemia, unspecified: Secondary | ICD-10-CM | POA: Insufficient documentation

## 2015-08-06 MED ORDER — FERROUS SULFATE 325 (65 FE) MG PO TABS: 325.0000 mg | ORAL_TABLET | Freq: Every day | ORAL | Status: DC

## 2015-08-06 NOTE — Progress Notes (Signed)
Pt is here for lab work only. 

## 2015-08-07 ENCOUNTER — Telehealth: Payer: Self-pay | Admitting: Internal Medicine

## 2015-08-07 ENCOUNTER — Other Ambulatory Visit: Payer: Self-pay | Admitting: Physician Assistant

## 2015-08-07 LAB — IRON AND TIBC
%SAT: 13 % (ref 11–50)
IRON: 53 ug/dL (ref 40–190)
TIBC: 402 ug/dL (ref 250–450)
UIBC: 349 ug/dL (ref 125–400)

## 2015-08-07 LAB — FERRITIN: FERRITIN: 7 ng/mL — AB (ref 10–154)

## 2015-08-07 NOTE — Telephone Encounter (Signed)
Patient needs celexa. Please follow up.

## 2015-08-14 ENCOUNTER — Other Ambulatory Visit: Payer: Self-pay | Admitting: Internal Medicine

## 2015-08-14 MED ORDER — CITALOPRAM HYDROBROMIDE 10 MG PO TABS: 10.0000 mg | ORAL_TABLET | Freq: Every day | ORAL | Status: DC

## 2015-08-14 NOTE — Telephone Encounter (Signed)
Refilled

## 2015-09-03 MED FILL — ?CITALOPRAM HBR 10 MG TABLE: 10 | 30 days supply | Qty: 30 | Fill #0

## 2015-09-15 ENCOUNTER — Other Ambulatory Visit: Payer: Self-pay | Admitting: *Deleted

## 2015-09-15 NOTE — Telephone Encounter (Signed)
Medical Assistant used [employer] to contact patient. Interpreter Name: Roslyn SmilingRafal Interpreter #: [phone]  Medical Assistant left message on patient's home and cell voicemail. Voicemail states to give a call back to [place] with [facility] at [phone].  !!!Patient has current refills for this medication!!!

## 2015-09-30 ENCOUNTER — Ambulatory Visit: Payer: Self-pay | Admitting: Internal Medicine

## 2015-10-09 MED FILL — ?CITALOPRAM HBR 10 MG TABLE: 10 | 30 days supply | Qty: 30 | Fill #1

## 2015-10-19 ENCOUNTER — Ambulatory Visit: Payer: Self-pay | Admitting: Internal Medicine

## 2015-10-23 ENCOUNTER — Other Ambulatory Visit: Payer: Self-pay | Admitting: Internal Medicine

## 2015-10-23 ENCOUNTER — Ambulatory Visit: Payer: Self-pay | Attending: Internal Medicine | Admitting: Internal Medicine

## 2015-10-23 VITALS — BP 121/75 | HR 98 | Temp 98.7°F | Resp 16 | Wt 166.4 lb

## 2015-10-23 DIAGNOSIS — Z79899 Other long term (current) drug therapy: Secondary | ICD-10-CM | POA: Insufficient documentation

## 2015-10-23 DIAGNOSIS — F32A Depression, unspecified: Secondary | ICD-10-CM

## 2015-10-23 DIAGNOSIS — R5383 Other fatigue: Secondary | ICD-10-CM

## 2015-10-23 DIAGNOSIS — Z23 Encounter for immunization: Secondary | ICD-10-CM

## 2015-10-23 DIAGNOSIS — Z131 Encounter for screening for diabetes mellitus: Secondary | ICD-10-CM | POA: Insufficient documentation

## 2015-10-23 DIAGNOSIS — D509 Iron deficiency anemia, unspecified: Secondary | ICD-10-CM | POA: Insufficient documentation

## 2015-10-23 DIAGNOSIS — R42 Dizziness and giddiness: Secondary | ICD-10-CM | POA: Insufficient documentation

## 2015-10-23 DIAGNOSIS — H538 Other visual disturbances: Secondary | ICD-10-CM | POA: Insufficient documentation

## 2015-10-23 DIAGNOSIS — F329 Major depressive disorder, single episode, unspecified: Secondary | ICD-10-CM | POA: Insufficient documentation

## 2015-10-23 DIAGNOSIS — H6121 Impacted cerumen, right ear: Secondary | ICD-10-CM

## 2015-10-23 LAB — CBC WITH DIFFERENTIAL/PLATELET
BASOS ABS: 0 {cells}/uL (ref 0–200)
Basophils Relative: 0 %
EOS PCT: 2 %
Eosinophils Absolute: 144 cells/uL (ref 15–500)
HCT: 35.8 % (ref 35.0–45.0)
Hemoglobin: 11.6 g/dL — ABNORMAL LOW (ref 11.7–15.5)
LYMPHS ABS: 2376 {cells}/uL (ref 850–3900)
Lymphocytes Relative: 33 %
MCH: 25.5 pg — AB (ref 27.0–33.0)
MCHC: 32.4 g/dL (ref 32.0–36.0)
MCV: 78.7 fL — ABNORMAL LOW (ref 80.0–100.0)
MONOS PCT: 7 %
MPV: 10.5 fL (ref 7.5–12.5)
Monocytes Absolute: 504 cells/uL (ref 200–950)
NEUTROS ABS: 4176 {cells}/uL (ref 1500–7800)
Neutrophils Relative %: 58 %
PLATELETS: 345 10*3/uL (ref 140–400)
RBC: 4.55 MIL/uL (ref 3.80–5.10)
RDW: 15 % (ref 11.0–15.0)
WBC: 7.2 10*3/uL (ref 3.8–10.8)

## 2015-10-23 LAB — POCT GLYCOSYLATED HEMOGLOBIN (HGB A1C): HEMOGLOBIN A1C: 5.6

## 2015-10-23 LAB — TSH: TSH: 0.98 m[IU]/L

## 2015-10-23 MED ORDER — CARBAMIDE PEROXIDE 6.5 % OT SOLN: 5.0000 [drp] | Freq: Two times a day (BID) | OTIC | 0 refills | Status: DC

## 2015-10-23 MED ORDER — CITALOPRAM HYDROBROMIDE 20 MG PO TABS: 10.0000 mg | ORAL_TABLET | Freq: Every day | ORAL | 1 refills | Status: DC

## 2015-10-23 NOTE — Progress Notes (Signed)
Tracie [name], is a 35 y.o. female  [id number]  [id number]  DOB - [birth year]  CC:  Chief Complaint  Patient presents with  . Dizziness  . Establish Care       HPI: Tracie [name] is a 35 y.o. female here today to establish medical care, last seen 5/17, w/ hx of ptsd, depression. Per pt, last couple of months has noted more fatigue, sleeping more, no energy. Denies si/hi/avh.  Finished taking her iron pills few months ago, no sure if needs to take more.  C/o of occasionally having blurry vision and seeing white spots in her eyes, but no related dizziness/loc. Has not had eye exam prior.  Married, has 1 child, safe relationship per pt.  Patient has No headache, No chest pain, denies dizziness, No abdominal pain - No Nausea, No new weakness tingling or numbness, No Cough - SOB.  Interpreter was used to communicate directly with patient for the entire encounter including providing detailed patient instructions.   Review of Systems: Per HPI, o/w all systems reviewed and negative.   No Known Allergies Past Medical History:  Diagnosis Date  . Headache    Current Outpatient Prescriptions on File Prior to Visit  Medication Sig Dispense Refill  . hydrOXYzine (ATARAX/VISTARIL) 10 MG tablet Take 10 mg by mouth 3 (three) times daily as needed for anxiety.    . cephALEXin (KEFLEX) 500 MG capsule Take 1 capsule (500 mg total) by mouth 2 (two) times daily. (Patient not taking: Reported on 10/23/2015) 10 capsule 0  . ferrous sulfate 325 (65 FE) MG tablet Take 1 tablet (325 mg total) by mouth daily with breakfast. (Patient not taking: Reported on 10/23/2015) 30 tablet 5  . pantoprazole (PROTONIX) 40 MG tablet Take 1 tablet (40 mg total) by mouth daily. (Patient not taking: Reported on 10/23/2015) 30 tablet 1  . [DISCONTINUED] PARoxetine (PAXIL) 20 MG tablet Take 1 tablet (20 mg total) by mouth daily. (Patient not taking: Reported on 01/12/2015) 30 tablet 3  . [DISCONTINUED]  SUMAtriptan (IMITREX) 50 MG tablet Take 1 tablet (50 mg total) by mouth every 2 (two) hours as needed for migraine or headache. May repeat in 2 hours if headache persists or recurs. (Patient not taking: Reported on 06/12/2014) 10 tablet 0  . [DISCONTINUED] traZODone (DESYREL) 50 MG tablet Take 1 tablet (50 mg total) by mouth at bedtime. (Patient not taking: Reported on 08/21/2014) 90 tablet 3   No current facility-administered medications on file prior to visit.    No family history on file. Social History   Social History  . Marital status: Single    Spouse name: N/A  . Number of children: N/A  . Years of education: N/A   Occupational History  . Not on file.   Social History Main Topics  . Smoking status: Never Smoker  . Smokeless tobacco: Not on file  . Alcohol use No  . Drug use: No  . Sexual activity: Yes    Birth control/ protection: None   Other Topics Concern  . Not on file   Social History Narrative  . No narrative on file    Objective:   Vitals:   10/23/15 1508  BP: 121/75  Pulse: 98  Resp: 16  Temp: 98.7 F (37.1 C)    Filed Weights   10/23/15 1508  Weight: 166 lb 6.4 oz (75.5 kg)    BP Readings from Last 3 Encounters:  10/23/15 121/75  07/30/15 107/66  06/11/15 123/85    Physical Exam:  Constitutional: Patient appears well-developed and well-nourished. No distress. AAOx3, flat affect, tired appearing. Obese, HENT: Normocephalic, atraumatic, External right and left ear normal. Oropharynx is clear and moist. bilat TMs clear, w/ some ceruminosis on the right ear. Eyes: Conjunctivae and EOM are normal. PERRL, no scleral icterus. Neck: Normal ROM. Neck supple. No JVD.  CVS: RRR, S1/S2 +, no murmurs, no gallops, no carotid bruit.  Pulmonary: Effort and breath sounds normal, no stridor, rhonchi, wheezes, rales.  Abdominal: Soft. BS +, no distension, tenderness, rebound or guarding.  Musculoskeletal: Normal range of motion. No edema and no tenderness.    LE: bilat/ no c/c/e, pulses 2+ bilateral. Lymphadenopathy: No lymphadenopathy noted, cervical. Neuro: Alert. muscle tone coordination wnl. No cranial nerve deficit grossly. Skin: Skin is warm and dry. No rash noted. Not diaphoretic. No erythema. No pallor. Psychiatric: Normal mood and affect. Behavior, judgment, thought content normal.  Lab Results  Component Value Date   WBC 7.6 07/30/2015   HGB 10.8 (L) 07/30/2015   HCT 32.5 (L) 07/30/2015   MCV 76.8 (L) 07/30/2015   PLT 356 07/30/2015   Lab Results  Component Value Date   CREATININE 0.76 07/30/2015   BUN 8 07/30/2015   NA 134 (L) 07/30/2015   K 3.9 07/30/2015   CL 102 07/30/2015   CO2 24 07/30/2015    Lab Results  Component Value Date   HGBA1C 5.6 10/23/2015   Lipid Panel  No results found for: CHOL, TRIG, HDL, CHOLHDL, VLDL, LDLCALC     Depression screen [facility] 2/9 10/23/2015 07/30/2015 06/11/2015 02/19/2015 01/12/2015  Decreased Interest 1 1 0 2 3  Down, Depressed, Hopeless 1 1 0 2 3  PHQ - 2 Score 2 2 0 4 6  Altered sleeping 1 1 - 3 -  Tired, decreased energy 1 1 - 2 -  Change in appetite 1 2 - 2 -  Feeling bad or failure about yourself  1 1 - 2 -  Trouble concentrating 1 2 - 2 -  Moving slowly or fidgety/restless 1 1 - 2 -  Suicidal thoughts 0 0 - 0 -  PHQ-9 Score 8 10 - 17 -  Difficult doing work/chores - Somewhat difficult - - -    Assessment and plan:   1. Depression (emotion), w/ sleeping more, more fatigue and decrease energy - no si/hi/avh, safe relationship - increase celexa 20mg  po qd - VITAMIN D 25 Hydroxy (Vit-D Deficiency, Fractures) - chk tsh,   2. Anemia, iron deficiency - CBC with Differential/Platelet - may just need daily iron, will follow  3. Blurry vision, bilateral ?somatization.  - Ambulatory referral to Ophthalmology  4. Diabetes mellitus screening - POCT glycosylated hemoglobin (Hb A1C)  5. Ceruminosis, right Debrox gtt  Return in about 4 weeks (around 11/20/2015) for  deppression /anemia.  The patient was given clear instructions to go to ER or return to medical center if symptoms don't improve, worsen or new problems develop. The patient verbalized understanding. The patient was told to call to get lab results if they haven't heard anything in the next week.    This note has been created with Education officer, environmental. Any transcriptional errors are unintentional.   Pete Glatter, MD, MBA/MHA [facility] [place], [place] [phone]   10/23/2015, 4:18 PM

## 2015-10-23 NOTE — Patient Instructions (Addendum)
Trastorno Tour manager (Major Depressive Disorder) El trastorno depresivo mayor es una enfermedad mental. Tambin se llamado depresin clnica o depresin unipolar. Produce sentimientos de tristeza, desesperanza o desamparo. Algunas personas con trastorno depresivo mayor no se sienten particularmente tristes, pero pierden Librarian, academic las cosas que solan disfrutar (anhedonia). Tambin puede causar sntomas fsicos. Interfiere en el trabajo, la escuela, las relaciones y otras actividades diarias normales. Puede variar en gravedad, pero es ms duradera y ms grave que la tristeza que todos sentimos de vez en cuando en nuestras vidas.  Muchas veces es desencadenada por sucesos estresantes o cambios importantes en la vida. Algunos ejemplos de estos factores desencadenantes son el divorcio, la prdida del trabajo o el hogar, [place], y la muerte de un familiar o amigo cercano. A veces aparece sin ninguna razn evidente. Las personas que tienen familiares con depresin mayor o con trastorno bipolar tienen ms riesgo de desarrollar depresin mayor con o sin factores de Psychologist, forensic. Puede ocurrir a Hotel manager. Puede ocurrir slo una vez en su vida(episodio nico de trastorno depresivo mayor). Puede ocurrir varias veces (trastorno depresivo mayor recurrente).  SNTOMAS  Las personas con trastorno depresivo mayor presentan anhedonia o estado de nimo deprimido casi [employer] durante al menos 2 semanas o ms. Los sntomas son:   Sensacin de tristeza Secretary/administrator) o vaco.  Sentimientos de desesperanza o desamparo.  Lagrimeo o episodios de llanto ( es observado por los dems).  Irritabilidad (en nios y adolescentes). Adems del estado de nimo deprimido o la anhedonia o ambos, estos enfermos tienen al menos cuatro de los siguientes sntomas :   Dificultad para dormir o Aeronautical engineer.   Cambio significativo (aumento o disminucin) en el apetito o Financial controller.   Falta de energa o  motivacin.  Sentimientos de culpa o desvalorizacin.   Dificultad para concentrarse, recordar o tomar decisiones.  Movimientos inusualmente lentos (retardo psicomotor retardation) o inquietud (segn lo observado por los dems).   Deseos recurrentes de muerte, pensamientos recurrentes de autoagresin (suicidio) o intento de suicidio. Las personas con trastorno depresivo mayor suelen tener pensamientos persistentes negativos acerca de s mismos, de otras personas y del mundo. Las personas con trastorno depresivo mayor grave pueden experimentar creencias o percepciones distorsionadas sobre el mundo (delirios psicticos). Tambin pueden ver u or cosas que no son reales(alucinaciones psicticas).  DIAGNSTICO  El diagnstico se realiza mediante una evaluacin hecha por el mdico. El mdico le preguntar acerca de los aspectos de su vida cotidiana, como el [place] de nimo, el sueo y el apetito, para ver si usted tiene los sntomas de Environmental education officer. Le har preguntas sobre su historial mdico y el consumo de alcohol o drogas, incluyendo medicamentos recetados. [employer] un examen fsico y Charity fundraiser anlisis de [place]. Esto se debe a que ciertas enfermedades y el uso de determinadas sustancias pueden causar sntomas similares a la depresin (depresin secundaria). Su mdico tambin podra derivarlo a Location manager salud mental para Ardelia Mems evaluacin y [place].  TRATAMIENTO  Es Glass blower/designer los sntomas y Geographical information systems officer. Los siguientes tratamientos pueden indicarse:    Medicamentos - Generalmente se recetan antidepresivos. Los antidepresivos se piensa que corrigen los desequilibrios qumicos en el cerebro que se asocian comnmente a la depresin Chiropractor. Se pueden agregar otros tipos de medicamentos si los sntomas no responden a los antidepresivos solos o si hay ideas delirantes o alucinaciones psicticas.  Psicoterapia - ciertos tipos de psicoterapia pueden ser tiles en el  tratamiento del trastorno de  la depresin mayor, proporcionando apoyo, educacin y orientacin. Ciertos tipos de psicoterapia tambin pueden ayudar a superar los pensamientos negativos (terapia cognitivo conductual) y a problemas de relacin que desencadena la depresin mayor (terapia interpersonal). Un especialista en salud mental puede ayudarlo a determinar qu tratamiento es el mejor para usted. La [employer] de los pacientes mejoran con una combinacin de medicacin y psicoterapia. Los tratamientos que implican la estimulacin elctrica del cerebro pueden ser utilizados en situaciones con sntomas muy graves o cuando los medicamentos y la psicoterapia no funcionan despus de un [place]. Estos tratamientos incluyen terapia electroconvulsiva, estimulacin magntica transcraneal y estimulacin del nervio vago.    Esta informacin no tiene Theme park manager el consejo del mdico. Asegrese de hacerle al mdico cualquier pregunta que tenga.   Document Released: 07/02/2012 Document Revised: 03/28/2014 Elsevier Interactive Patient Education 2016 Elsevier Inc.  -  Anemia por deficiencia de hierro - Adultos (Iron Deficiency Anemia, Adult) La anemia sucede cuando la cantidad de glbulos rojos sanos es baja. Con frecuencia, la causa es una cantidad insuficiente de hierro. Esto se denomina anemia por deficiencia de hierro y puede provocarle cansancio y dificultad para respirar. CUIDADOS EN EL HOGAR   Tome el hierro segn las indicaciones del mdico.  Tome las vitaminas segn las indicaciones del mdico.  Consuma alimentos que contengan hierro. Estos incluyen hgado, carne magra, pan de grano integral, huevos, frutas deshidratadas y vegetales de hojas color verde oscuro. SOLICITE AYUDA DE INMEDIATO SI:  Pierde el conocimiento (se desmaya).  Siente dolor en el pecho.  Tiene malestar estomacal (nuseas) o vomita.  Tiene mucha dificultad para respirar cuando realiza actividades.  Se siente  dbil.  La frecuencia cardaca est acelerada.  Comienza a sudar sin motivo.  Se siente mareado cuando se levanta de una silla o de la cama. ASEGRESE DE QUE:  Comprende estas instrucciones.  Controlar su afeccin.  Recibir ayuda de inmediato si no mejora o si empeora.   Esta informacin no tiene Theme park manager el consejo del mdico. Asegrese de hacerle al mdico cualquier pregunta que tenga.   Document Released: 06/11/2010 Document Revised: 07/22/2014 Elsevier Interactive Patient Education [employer]

## 2015-10-24 ENCOUNTER — Other Ambulatory Visit: Payer: Self-pay | Admitting: Internal Medicine

## 2015-10-24 LAB — VITAMIN D 25 HYDROXY (VIT D DEFICIENCY, FRACTURES): Vit D, 25-Hydroxy: 17 ng/mL — ABNORMAL LOW (ref 30–100)

## 2015-10-24 MED ORDER — VITAMIN D (ERGOCALCIFEROL) 1.25 MG (50000 UNIT) PO CAPS: 50000.0000 [IU] | ORAL_CAPSULE | ORAL | 0 refills | Status: DC

## 2015-10-24 MED ORDER — FERROUS SULFATE 325 (65 FE) MG PO TABS: 325.0000 mg | ORAL_TABLET | Freq: Two times a day (BID) | ORAL | 5 refills | Status: DC

## 2015-10-26 MED FILL — EAR DROPS 6.5%: 6.5 | 7 days supply | Qty: 15 | Fill #0

## 2015-10-26 MED FILL — FERROUS SULFATE 325 MG TAB: 325 (65 FE) | 30 days supply | Qty: 60 | Fill #0

## 2015-10-26 MED FILL — hydrOXYzine HCL 10 MG TABS: 10 | 30 days supply | Qty: 90 | Fill #0

## 2015-10-26 MED FILL — VIT D2 1.25 MG (50,000 UNIT: 1.25 MG | 84 days supply | Qty: 12 | Fill #0

## 2015-10-28 ENCOUNTER — Telehealth: Payer: Self-pay

## 2015-10-28 MED FILL — CITALOPRAM HBR 20 MG TABLET: 20 | 30 days supply | Qty: 15 | Fill #0

## 2015-10-28 NOTE — Telephone Encounter (Signed)
[employer] Lissa HoardLouis ZO#[id number] Contacted pt to go over lab results pt is aware of lab results and doesn't have any questions or concerns

## 2015-11-10 ENCOUNTER — Telehealth: Payer: Self-pay | Admitting: Internal Medicine

## 2015-11-10 NOTE — Telephone Encounter (Signed)
Pt came to the office to request a refill for citalopram (CELEXA) 20 MG tablet but was told that they can't refill it. According to patient she stated that Dr. Julien NordmannLangeland wanted her to increase dosage of medication and therefore she no longer has any more pills left. Pt is confused and needs more of the medication. Please follow up. [phone])  Thank you.

## 2015-11-11 MED ORDER — CITALOPRAM HYDROBROMIDE 20 MG PO TABS: 20.0000 mg | ORAL_TABLET | Freq: Every day | ORAL | 1 refills | Status: DC

## 2015-11-11 MED FILL — CITALOPRAM HBR 20 MG TABLET: 20 | 30 days supply | Qty: 30 | Fill #0

## 2015-11-11 NOTE — Telephone Encounter (Signed)
Please call her, I put in new rx for celexa 20 qday. Should be able to pick up this afternoon. thx

## 2015-11-11 NOTE — Telephone Encounter (Signed)
[employer] [facility] [place] Id: [id number] contacted pt to make aware Dr. Julien NordmannLangeland sent in new rx of Celexa to the pharmacy pt did not answer lvm regarding information

## 2015-11-11 NOTE — Telephone Encounter (Signed)
Will forward to pcp

## 2015-12-03 MED FILL — FERROUS SULFATE 325 MG TAB: 325 (65 FE) | 30 days supply | Qty: 60 | Fill #1

## 2015-12-08 MED FILL — ?MECLIZINE 25 MG TABLET: 25 | 30 days supply | Qty: 60 | Fill #0

## 2015-12-10 MED FILL — metroNIDAZOLE 500 MG TABS: 500 | 7 days supply | Qty: 14 | Fill #0

## 2015-12-10 MED FILL — FLUCONAZOLE 200 MG TABLET: 200 | 14 days supply | Qty: 2 | Fill #0

## 2015-12-18 ENCOUNTER — Ambulatory Visit: Payer: Self-pay | Attending: Internal Medicine

## 2015-12-28 ENCOUNTER — Ambulatory Visit (HOSPITAL_COMMUNITY)
Admission: EM | Admit: 2015-12-28 | Discharge: 2015-12-28 | Disposition: A | Discharge status: Home / Self Care | Payer: Self-pay | Attending: Internal Medicine | Admitting: Internal Medicine

## 2015-12-28 ENCOUNTER — Encounter (HOSPITAL_COMMUNITY): Payer: Self-pay | Admitting: Family Medicine

## 2015-12-28 DIAGNOSIS — Z79899 Other long term (current) drug therapy: Secondary | ICD-10-CM | POA: Insufficient documentation

## 2015-12-28 DIAGNOSIS — K649 Unspecified hemorrhoids: Secondary | ICD-10-CM | POA: Insufficient documentation

## 2015-12-28 DIAGNOSIS — K921 Melena: Secondary | ICD-10-CM | POA: Insufficient documentation

## 2015-12-28 DIAGNOSIS — R1012 Left upper quadrant pain: Secondary | ICD-10-CM | POA: Insufficient documentation

## 2015-12-28 LAB — POCT URINALYSIS DIP (DEVICE)
Bilirubin Urine: NEGATIVE
Glucose, UA: NEGATIVE mg/dL
HGB URINE DIPSTICK: NEGATIVE
Nitrite: NEGATIVE
Protein, ur: NEGATIVE mg/dL
SPECIFIC GRAVITY, URINE: 1.02 (ref 1.005–1.030)
UROBILINOGEN UA: 0.2 mg/dL (ref 0.0–1.0)
pH: 6 (ref 5.0–8.0)

## 2015-12-28 MED ORDER — HYDROCORTISONE 2.5 % RE CREA: TOPICAL_CREAM | RECTAL | 1 refills | Status: DC

## 2015-12-28 MED ORDER — TRAMADOL HCL 50 MG PO TABS: 50.0000 mg | ORAL_TABLET | Freq: Four times a day (QID) | ORAL | 0 refills | Status: DC | PRN

## 2015-12-28 NOTE — Discharge Instructions (Signed)
°  °  no hay resultados preocupantes en su examen de hoy. su vagina es normal y tiene 2 hemorroides pequeas. hay dos medicamentos recetados. uno es para Chief Technology Officerel dolor en su lado y el otro para el sangrado con sus heces. usted W. R. Berkleytambin debe hacer un seguimiento con su mdico.  there are no worrying findings on your examination today. your vagina is normal and you have 2 small hemorrhoids.  there are two medications prescribed. one is for the pain in your side and the other for the bleeding with your stool. you should also follow up with your doctor.

## 2015-12-28 NOTE — ED Triage Notes (Signed)
Pt here for LUQ and flank pain that has been going on for a few days. sts last normal BM this am. sts some blood in tissue with wiping and in the toilet. Denies N,V. sts she has been having muscle cramps all over.

## 2015-12-29 LAB — CERVICOVAGINAL ANCILLARY ONLY
Chlamydia: NEGATIVE
NEISSERIA GONORRHEA: NEGATIVE
WET PREP (BD AFFIRM): NEGATIVE

## 2015-12-29 MED FILL — ?CITALOPRAM HBR 20 MG TABLE: 20 | 30 days supply | Qty: 30 | Fill #1

## 2015-12-29 MED FILL — HYDROCORTISONE 2.5% CREAM: 2.5 | 20 days supply | Qty: 30 | Fill #0

## 2015-12-30 NOTE — ED Provider Notes (Signed)
CSN: [id number]     Arrival date & time 12/28/15  1438 History   First MD Initiated Contact with Patient 12/28/15 1731     Chief Complaint  Patient presents with  . Abdominal Pain   (Consider location/radiation/quality/duration/timing/severity/associated sxs/prior Treatment) HPI VIDEO INTERPRETER NP PT PRESENTS WITH EPISODES OF ABDO PAIN. PAIN IS LUQ AND LEFT FLANK. SXS PRESENT FOR A FEW DAYS. THIS AM AFTER BM NOTICED BLOOD ON TISSUE. TWO EPISODES. ALSO C/O PELVIC PAIN. NO DISCHARGE.  Past Medical History:  Diagnosis Date  . Headache    History reviewed. No pertinent surgical history. History reviewed. No pertinent family history. Social History  Substance Use Topics  . Smoking status: Never Smoker  . Smokeless tobacco: Never Used  . Alcohol use No   OB History    No data available     Review of Systems  Denies: HEADACHE, NAUSEA,   CHEST PAIN, CONGESTION, DYSURIA, SHORTNESS OF BREATH  Allergies  Review of patient's allergies indicates no known allergies.  Home Medications   Prior to Admission medications   Medication Sig Start Date End Date Taking? Authorizing Provider  citalopram (CELEXA) 20 MG tablet Take 1 tablet (20 mg total) by mouth daily. 11/11/15   Pete Glatter, MD  ferrous sulfate 325 (65 FE) MG tablet Take 1 tablet (325 mg total) by mouth 2 (two) times daily with a meal. 10/24/15   Pete Glatter, MD  hydrocortisone (ANUSOL-HC) 2.5 % rectal cream Apply rectally 2 times daily 12/28/15   Tharon Aquas, PA  traMADol (ULTRAM) 50 MG tablet Take 1 tablet (50 mg total) by mouth every 6 (six) hours as needed. 12/28/15   Tharon Aquas, PA   Meds Ordered and Administered this Visit  Medications - No data to display  BP 132/63   Pulse 88   Temp 98.4 F (36.9 C)   LMP 12/19/2015   SpO2 100%  No data found.   Physical Exam NURSES NOTES AND VITAL SIGNS REVIEWED. CONSTITUTIONAL: Well developed, well nourished, no acute distress HEENT: normocephalic,  atraumatic EYES: Conjunctiva normal NECK:normal ROM, supple, no adenopathy PULMONARY:No respiratory distress, normal effort ABDOMINAL: Soft, ND, NT BS+, No CVAT MUSCULOSKELETAL: Normal ROM of all extremities,  SKIN: warm and dry without rash PSYCHIATRIC: Mood and affect, behavior are normal PELVIC EXAM BY DR. Dayton Scrape NO VAGINAL ABNORMALITIES REPORTED.  Urgent Care Course   Clinical Course    Procedures (including critical care time)  Labs Review Labs Reviewed  POCT URINALYSIS DIP (DEVICE) - Abnormal; Notable for the following:       Result Value   Ketones, ur TRACE (*)    Leukocytes, UA TRACE (*)    All other components within normal limits  CERVICOVAGINAL ANCILLARY ONLY  CERVICOVAGINAL ANCILLARY ONLY    Imaging Review No results found.   Visual Acuity Review  Right Eye Distance:   Left Eye Distance:   Bilateral Distance:    Right Eye Near:   Left Eye Near:    Bilateral Near:         MDM   1. Left upper quadrant pain   2. Blood in the stool   3. Hemorrhoids, unspecified hemorrhoid type     Patient is reassured that there are no issues that require transfer to higher level of care at this time or additional tests. Patient is advised to continue home symptomatic treatment. Patient is advised that if there are new or worsening symptoms to attend the emergency department, contact primary care provider, or return  to UC. Instructions of care provided discharged home in stable condition.    THIS NOTE WAS GENERATED USING A VOICE RECOGNITION SOFTWARE PROGRAM. ALL REASONABLE EFFORTS  WERE MADE TO PROOFREAD THIS DOCUMENT FOR ACCURACY.  I have verbally reviewed the discharge instructions with the patient. A printed AVS was given to the patient.  All questions were answered prior to discharge.       Tharon AquasFrank C Chinmay Squier, PA 12/30/15 1554

## 2016-01-13 ENCOUNTER — Ambulatory Visit: Payer: Self-pay | Admitting: Internal Medicine

## 2016-01-18 ENCOUNTER — Ambulatory Visit: Payer: Self-pay | Attending: Internal Medicine | Admitting: Family Medicine

## 2016-01-18 ENCOUNTER — Encounter: Payer: Self-pay | Admitting: Family Medicine

## 2016-01-18 VITALS — BP 113/78 | HR 85 | Temp 97.9°F | Ht 64.5 in | Wt 170.8 lb

## 2016-01-18 DIAGNOSIS — Z79899 Other long term (current) drug therapy: Secondary | ICD-10-CM | POA: Insufficient documentation

## 2016-01-18 DIAGNOSIS — K2990 Gastroduodenitis, unspecified, without bleeding: Secondary | ICD-10-CM | POA: Insufficient documentation

## 2016-01-18 DIAGNOSIS — N938 Other specified abnormal uterine and vaginal bleeding: Secondary | ICD-10-CM

## 2016-01-18 DIAGNOSIS — K625 Hemorrhage of anus and rectum: Secondary | ICD-10-CM

## 2016-01-18 DIAGNOSIS — R109 Unspecified abdominal pain: Secondary | ICD-10-CM | POA: Insufficient documentation

## 2016-01-18 DIAGNOSIS — F419 Anxiety disorder, unspecified: Secondary | ICD-10-CM | POA: Insufficient documentation

## 2016-01-18 DIAGNOSIS — K2970 Gastritis, unspecified, without bleeding: Secondary | ICD-10-CM

## 2016-01-18 MED ORDER — MEDROXYPROGESTERONE ACETATE 10 MG PO TABS: 10.0000 mg | ORAL_TABLET | Freq: Every day | ORAL | 0 refills | Status: DC

## 2016-01-18 MED ORDER — PANTOPRAZOLE SODIUM 40 MG PO TBEC: 40.0000 mg | DELAYED_RELEASE_TABLET | Freq: Every day | ORAL | 3 refills | Status: DC

## 2016-01-18 MED ORDER — HYDROCORTISONE ACE-PRAMOXINE 2.5-1 % RE CREA: 1.0000 "application " | TOPICAL_CREAM | Freq: Three times a day (TID) | RECTAL | 1 refills | Status: DC

## 2016-01-18 MED FILL — HYDROCORT-PRAMOXINE 2.5-1%: 2.5-1 | 15 days supply | Qty: 30 | Fill #0

## 2016-01-18 MED FILL — ?PANTOPRAZOLE SOD DR 40MG: 40 MG | 30 days supply | Qty: 30 | Fill #0

## 2016-01-18 MED FILL — MEDROXYPROGESTERONE 10 MG T: 10 | 10 days supply | Qty: 10 | Fill #0

## 2016-01-18 NOTE — Progress Notes (Signed)
Subjective:  Patient ID: Tracie [name], female    DOB: 1980-08-08  Age: 35 y.o. MRN: [id number]  CC: Rectal Bleeding ("stabbing pain in rectum",  significant bleeding); Abdominal Pain (cramping all the time); and Vaginal Bleeding ("period for 2 weeks')   HPI Tracie Dickenslma [name] is a 35 year old female with a history of anxiety who presents today complaining of bright red blood per rectum with associated rectal pain for the last 2 weeks. Blood is noticed to be mixed with stool and she denies constipation. Has also had epigastric pain associated with nausea but no vomiting.  Complains of irregular periods which have been going on for less than a little less than a year and in the last 2 weeks has noticed passage of blood clots. Periods have also been irregular and review of her chart indicates she has had dysfunctional uterine bleeding with her last pelvic ultrasound in 2015 which was negative for fibroids. She states that prior to one year ago her periods were regular.  Past Medical History:  Diagnosis Date  . Headache     History reviewed. No pertinent surgical history.  No Known Allergies   Outpatient Medications Prior to Visit  Medication Sig Dispense Refill  . citalopram (CELEXA) 20 MG tablet Take 1 tablet (20 mg total) by mouth daily. 90 tablet 1  . ferrous sulfate 325 (65 FE) MG tablet Take 1 tablet (325 mg total) by mouth 2 (two) times daily with a meal. (Patient not taking: Reported on 01/18/2016) 120 tablet 5  . hydrocortisone (ANUSOL-HC) 2.5 % rectal cream Apply rectally 2 times daily (Patient not taking: Reported on 01/18/2016) 28 g 1  . traMADol (ULTRAM) 50 MG tablet Take 1 tablet (50 mg total) by mouth every 6 (six) hours as needed. (Patient not taking: Reported on 01/18/2016) 15 tablet 0   No facility-administered medications prior to visit.     ROS Review of Systems  Constitutional: Negative for activity change, appetite change and fatigue.  HENT: Negative  for congestion, sinus pressure and sore throat.   Eyes: Negative for visual disturbance.  Respiratory: Negative for cough, chest tightness, shortness of breath and wheezing.   Cardiovascular: Negative for chest pain and palpitations.  Gastrointestinal: Positive for blood in stool and rectal pain. Negative for abdominal distention, abdominal pain and constipation.  Endocrine: Negative for polydipsia.  Genitourinary: Positive for menstrual problem. Negative for dysuria and frequency.  Musculoskeletal: Negative for arthralgias and back pain.  Skin: Negative for rash.  Neurological: Negative for tremors, light-headedness and numbness.  Hematological: Does not bruise/bleed easily.  Psychiatric/Behavioral: Negative for agitation and behavioral problems.    Objective:  BP 113/78 (BP Location: Right Arm, Patient Position: Sitting, Cuff Size: Large)   Pulse 85   Temp 97.9 F (36.6 C) (Oral)   Ht 5' 4.5" (1.638 m)   Wt 170 lb 12.8 oz (77.5 kg)   LMP 12/19/2015   SpO2 100%   BMI 28.86 kg/m   BP/Weight 01/18/2016 12/28/2015 10/23/2015  Systolic BP 113 132 121  Diastolic BP 78 63 75  Wt. (Lbs) 170.8 - 166.4  BMI 28.86 - 27.69     Physical Exam  Constitutional: She is oriented to person, place, and time. She appears well-developed and well-nourished.  Cardiovascular: Normal rate, normal heart sounds and intact distal pulses.   No murmur heard. Pulmonary/Chest: Effort normal and breath sounds normal. She has no wheezes. She has no rales. She exhibits no tenderness.  Abdominal: Soft. Bowel sounds are normal. She exhibits no distension and  no mass. There is no tenderness.  Musculoskeletal: Normal range of motion.  Neurological: She is alert and oriented to person, place, and time.     Assessment & Plan:   1. Gastritis and gastroduodenitis This could explain abdominal pain - pantoprazole (PROTONIX) 40 MG tablet; Take 1 tablet (40 mg total) by mouth daily.  Dispense: 30 tablet; Refill:  3  2. DUB (dysfunctional uterine bleeding) Previous pelvic ultrasound in 2015 was negative for fibroids We'll commence OCP at next visit to regularize. - medroxyPROGESTERone (PROVERA) 10 MG tablet; Take 1 tablet (10 mg total) by mouth daily.  Dispense: 10 tablet; Refill: 0  3. Rectal bleeding Unable to perform a rectal exam as she is currently having her periods We'll treat for hemorrhoids but we'll need to exclude anal fissure - hydrocortisone-pramoxine (ANALPRAM HC) 2.5-1 % rectal cream; Place 1 application rectally 3 (three) times daily.  Dispense: 30 g; Refill: 1   Meds ordered this encounter  Medications  . pantoprazole (PROTONIX) 40 MG tablet    Sig: Take 1 tablet (40 mg total) by mouth daily.    Dispense:  30 tablet    Refill:  3  . medroxyPROGESTERone (PROVERA) 10 MG tablet    Sig: Take 1 tablet (10 mg total) by mouth daily.    Dispense:  10 tablet    Refill:  0  . hydrocortisone-pramoxine (ANALPRAM HC) 2.5-1 % rectal cream    Sig: Place 1 application rectally 3 (three) times daily.    Dispense:  30 g    Refill:  1    Follow-up: Return in about 3 weeks (around 02/08/2016) for follow up on Dysfunctional uterine bleeding.   Jaclyn ShaggyEnobong Amao MD

## 2016-01-30 ENCOUNTER — Ambulatory Visit (HOSPITAL_COMMUNITY)
Admission: EM | Admit: 2016-01-30 | Discharge: 2016-01-30 | Disposition: A | Discharge status: Home / Self Care | Payer: Self-pay | Attending: Family Medicine | Admitting: Family Medicine

## 2016-01-30 ENCOUNTER — Encounter (HOSPITAL_COMMUNITY): Payer: Self-pay | Admitting: Emergency Medicine

## 2016-01-30 DIAGNOSIS — N946 Dysmenorrhea, unspecified: Secondary | ICD-10-CM

## 2016-01-30 LAB — POCT PREGNANCY, URINE: Preg Test, Ur: NEGATIVE

## 2016-01-30 LAB — POCT URINALYSIS DIP (DEVICE)
Bilirubin Urine: NEGATIVE
Glucose, UA: NEGATIVE mg/dL
KETONES UR: NEGATIVE mg/dL
Leukocytes, UA: NEGATIVE
Nitrite: NEGATIVE
PROTEIN: NEGATIVE mg/dL
SPECIFIC GRAVITY, URINE: 1.01 (ref 1.005–1.030)
UROBILINOGEN UA: 0.2 mg/dL (ref 0.0–1.0)
pH: 6.5 (ref 5.0–8.0)

## 2016-01-30 MED ORDER — HYDROCODONE-ACETAMINOPHEN 5-325 MG PO TABS: 1.0000 | ORAL_TABLET | Freq: Four times a day (QID) | ORAL | 0 refills | Status: DC | PRN

## 2016-01-30 NOTE — ED Triage Notes (Signed)
Abdominal pain and cramping, patient has seen blood with wiping patient has lower back pain

## 2016-01-30 NOTE — Discharge Instructions (Signed)
Usualmente la regla dura 3-5 days despues de [employer] App[employer] Grouptomar la medicina.  Vamos a escribir una receta para Chief Technology Officerel dolor.  Regressa a su doctor para Margie Billetuna ultrasonida en 3-5 dias si la problem continua.

## 2016-01-30 NOTE — ED Notes (Signed)
[facility]  Dated 01/18/16, quantity 10.

## 2016-01-30 NOTE — ED Provider Notes (Signed)
[facility]    CSN: [id number] Arrival date & time: 01/30/16  1258     History   Chief Complaint Chief Complaint  Patient presents with  . Abdominal Pain    HPI Tracie [name] is a 35 y.o. female.   Is a 35 year old woman with lower abdominal crampy pain. Is going on for couple days. She has a history of irregular periods and 12 days ago was put on a 10 day course of Provera. She finished the Provera 2 days ago and then one day after finishing, the pains began again. She says she was not examined before given the Provera.  Patient is a gravida 1 para 1 married woman. She works for a Cytogeneticistdry cleaning business.      Past Medical History:  Diagnosis Date  . Headache     Patient Active Problem List   Diagnosis Date Noted  . Anemia, iron deficiency 10/23/2015  . Dizziness 06/11/2015  . Health care maintenance 02/19/2015  . Right lower quadrant pain 02/19/2015  . Stool mucus 02/19/2015  . Ear pain 09/18/2014  . Muscle spasm of back 09/18/2014  . Flank pain 08/21/2014  . Depression (emotion) 06/12/2014  . Generalized anxiety disorder 06/12/2014  . Post traumatic stress disorder (PTSD) 06/12/2014  . Abdominal pain, epigastric 12/09/2013  . DUB (dysfunctional uterine bleeding) 12/09/2013  . Bilateral chronic knee pain 10/28/2013  . Vaginitis and vulvovaginitis 10/28/2013  . Preventative health care 09/09/2013    History reviewed. No pertinent surgical history.  OB History    No data available       Home Medications    Prior to Admission medications   Medication Sig Start Date End Date Taking? Authorizing Provider  citalopram (CELEXA) 20 MG tablet Take 1 tablet (20 mg total) by mouth daily. 11/11/15   Pete Glatterawn T Langeland, MD  HYDROcodone-acetaminophen (NORCO) 5-325 MG tablet Take 1 tablet by mouth every 6 (six) hours as needed for moderate pain. 01/30/16   Elvina SidleKurt Glynn Freas, MD  hydrocortisone-pramoxine [facility](ANALPRAM HC) 2.5-1 % rectal cream Place 1  application rectally 3 (three) times daily. 01/18/16   Jaclyn ShaggyEnobong Amao, MD  hydrOXYzine (ATARAX/VISTARIL) 10 MG tablet Take 10 mg by mouth 3 (three) times daily as needed.    Historical Provider, MD  pantoprazole (PROTONIX) 40 MG tablet Take 1 tablet (40 mg total) by mouth daily. 01/18/16   Jaclyn ShaggyEnobong Amao, MD    Family History No family history on file.  Social History Social History  Substance Use Topics  . Smoking status: Never Smoker  . Smokeless tobacco: Never Used  . Alcohol use No     Allergies   Patient has no known allergies.   Review of Systems Review of Systems  Constitutional: Negative.   HENT: Negative.   Eyes: Negative.   Respiratory: Negative.   Gastrointestinal: Positive for abdominal pain.  Genitourinary: Positive for menstrual problem.  Neurological: Negative.      Physical Exam Triage Vital Signs ED Triage Vitals [01/30/16 1409]  Enc Vitals Group     BP 121/74     Pulse Rate 73     Resp 16     Temp 98.2 F (36.8 C)     Temp Source Oral     SpO2 100 %     Weight      Height      Head Circumference      Peak Flow      Pain Score 6     Pain Loc  Pain Edu?      Excl. in GC?    No data found.   Updated Vital Signs BP 121/74 (BP Location: Left Arm)   Pulse 73   Temp 98.2 F (36.8 C) (Oral)   Resp 16   LMP  (LMP Unknown)   SpO2 100%   Physical Exam  Constitutional: She is oriented to person, place, and time. She appears well-developed and well-nourished.  HENT:  Head: Normocephalic.  Right Ear: External ear normal.  Left Ear: External ear normal.  Eyes: Conjunctivae and EOM are normal. Pupils are equal, round, and reactive to light.  Neck: Normal range of motion. Neck supple.  Pulmonary/Chest: Effort normal.  Abdominal: Soft. She exhibits no distension. There is no tenderness. There is no rebound and no guarding.  Genitourinary: Uterus normal.  Genitourinary Comments: Pelvic exam reveals an old posterior scar from delivery,  otherwise normal sized, nontender uterus and no adnexal tenderness or enlargement.  There is mild menstrual bleeding in the vaginal vault  Musculoskeletal: Normal range of motion.  Neurological: She is alert and oriented to person, place, and time.  Skin: Skin is warm.  Nursing note and vitals reviewed.    UC Treatments / Results  Labs (all labs ordered are listed, but only abnormal results are displayed) Labs Reviewed  POCT URINALYSIS DIP (DEVICE) - Abnormal; Notable for the following:       Result Value   Hgb urine dipstick LARGE (*)    All other components within normal limits  POCT PREGNANCY, URINE    EKG  EKG Interpretation None       Radiology No results found.  Procedures Procedures (including critical care time)  Medications Ordered in UC Medications - No data to display   Initial Impression / Assessment and Plan / UC Course  I have reviewed the triage vital signs and the nursing notes.  Pertinent labs & imaging results that were available during my care of the patient were reviewed by me and considered in my medical decision making (see chart for details).  Clinical Course     Final Clinical Impressions(s) / UC Diagnoses   Final diagnoses:  Dysmenorrhea    New Prescriptions New Prescriptions   HYDROCODONE-ACETAMINOPHEN (NORCO) 5-325 MG TABLET    Take 1 tablet by mouth every 6 (six) hours as needed for moderate pain.     Elvina SidleKurt Jini Horiuchi, MD 01/30/16 1505

## 2016-02-02 MED FILL — CITALOPRAM HBR 20 MG TABLET: 20 | 30 days supply | Qty: 30 | Fill #2

## 2016-02-05 MED FILL — FERROUS SULFATE 325 MG TAB: 325 (65 FE) | 30 days supply | Qty: 60 | Fill #2

## 2016-02-08 ENCOUNTER — Encounter: Payer: Self-pay | Admitting: Family Medicine

## 2016-02-08 ENCOUNTER — Ambulatory Visit: Payer: Self-pay | Attending: Family Medicine | Admitting: Family Medicine

## 2016-02-08 VITALS — BP 122/79 | HR 82 | Temp 99.1°F | Ht 64.0 in | Wt 170.8 lb

## 2016-02-08 DIAGNOSIS — K5909 Other constipation: Secondary | ICD-10-CM

## 2016-02-08 DIAGNOSIS — D649 Anemia, unspecified: Secondary | ICD-10-CM | POA: Insufficient documentation

## 2016-02-08 DIAGNOSIS — N898 Other specified noninflammatory disorders of vagina: Secondary | ICD-10-CM | POA: Insufficient documentation

## 2016-02-08 DIAGNOSIS — K5900 Constipation, unspecified: Secondary | ICD-10-CM | POA: Insufficient documentation

## 2016-02-08 DIAGNOSIS — K2990 Gastroduodenitis, unspecified, without bleeding: Secondary | ICD-10-CM

## 2016-02-08 DIAGNOSIS — Z79899 Other long term (current) drug therapy: Secondary | ICD-10-CM | POA: Insufficient documentation

## 2016-02-08 DIAGNOSIS — K2970 Gastritis, unspecified, without bleeding: Secondary | ICD-10-CM

## 2016-02-08 DIAGNOSIS — R5383 Other fatigue: Secondary | ICD-10-CM

## 2016-02-08 DIAGNOSIS — R040 Epistaxis: Secondary | ICD-10-CM

## 2016-02-08 DIAGNOSIS — R42 Dizziness and giddiness: Secondary | ICD-10-CM | POA: Insufficient documentation

## 2016-02-08 DIAGNOSIS — N938 Other specified abnormal uterine and vaginal bleeding: Secondary | ICD-10-CM

## 2016-02-08 MED ORDER — LACTULOSE 10 GM/15ML PO SOLN: 10.0000 g | Freq: Two times a day (BID) | ORAL | 0 refills | Status: DC | PRN

## 2016-02-08 MED ORDER — NORGESTIMATE-ETH ESTRADIOL 0.25-35 MG-MCG PO TABS: 1.0000 | ORAL_TABLET | Freq: Every day | ORAL | 11 refills | Status: DC

## 2016-02-08 MED FILL — MONO-LINYAH 28 TABLET: 0.25-35 | 28 days supply | Qty: 28 | Fill #0

## 2016-02-08 MED FILL — LACTULOSE 10 GM/15 ML SOLN: 10 | 31 days supply | Qty: 946 | Fill #0

## 2016-02-08 NOTE — Progress Notes (Signed)
Subjective:  Patient ID: Tracie [name], female    DOB: 1981-01-13  Age: 35 y.o. MRN: [id number]  CC: uterine bleeding (follow up  "a little bit of bleeding"); Vaginal Discharge (red); Shortness of Breath; Dizziness; Fatigue ("extreme"); and Headache   HPI Tracie Dickenslma [name] presents for Follow-up of abdominal pain and dysfunctional uterine bleeding for which she received Provera at her last office visit (unable to perform pelvic exam as she was bleeding at the time); previous pelvic ultrasound had been negative for fibroids. Bleeding has improved; last Pap smear in 2015 was negative for malignancy She had also complained of rectal pain at her last visit which has resolved at this time.  Was placed on PPI for abdominal pain at her last visit  She informs me she is sometimes fatigued and dizzy and has been taking iron pills due to her diagnosis of anemia. She sometimes notices mild intermittent epistaxis which resolved spontaneously. Last hemoglobin was 11.6 in 10/2015.  Past Medical History:  Diagnosis Date  . Headache     History reviewed. No pertinent surgical history.     Outpatient Medications Prior to Visit  Medication Sig Dispense Refill  . citalopram (CELEXA) 20 MG tablet Take 1 tablet (20 mg total) by mouth daily. 90 tablet 1  . HYDROcodone-acetaminophen (NORCO) 5-325 MG tablet Take 1 tablet by mouth every 6 (six) hours as needed for moderate pain. 12 tablet 0  . hydrocortisone-pramoxine (ANALPRAM HC) 2.5-1 % rectal cream Place 1 application rectally 3 (three) times daily. 30 g 1  . hydrOXYzine (ATARAX/VISTARIL) 10 MG tablet Take 10 mg by mouth 3 (three) times daily as needed.    . pantoprazole (PROTONIX) 40 MG tablet Take 1 tablet (40 mg total) by mouth daily. 30 tablet 3   No facility-administered medications prior to visit.     ROS Review of Systems  Constitutional: Positive for fatigue. Negative for activity change and appetite change.  HENT: Negative for  congestion, sinus pressure and sore throat.   Eyes: Negative for visual disturbance.  Respiratory: Negative for cough, chest tightness, shortness of breath and wheezing.   Cardiovascular: Negative for chest pain and palpitations.  Gastrointestinal: Positive for constipation. Negative for abdominal distention and abdominal pain.  Endocrine: Negative for polydipsia.  Genitourinary: Negative.  Negative for dysuria and frequency.  Musculoskeletal: Negative.  Negative for arthralgias and back pain.  Skin: Negative for rash.  Neurological: Positive for dizziness and light-headedness. Negative for tremors and numbness.  Hematological: Does not bruise/bleed easily.  Psychiatric/Behavioral: Negative for agitation, behavioral problems and dysphoric mood.    Objective:  BP 122/79 (BP Location: Right Arm, Patient Position: Sitting, Cuff Size: Small)   Pulse 82   Temp 99.1 F (37.3 C) (Oral)   Ht 5\' 4"  (1.626 m)   Wt 170 lb 12.8 oz (77.5 kg)   LMP  (LMP Unknown)   SpO2 98%   BMI 29.32 kg/m   BP/Weight 02/08/2016 01/30/2016 01/18/2016  Systolic BP 122 121 113  Diastolic BP 79 74 78  Wt. (Lbs) 170.8 - 170.8  BMI 29.32 - 28.86      Physical Exam  Constitutional: She is oriented to person, place, and time. She appears well-developed and well-nourished.  Cardiovascular: Normal rate, normal heart sounds and intact distal pulses.   No murmur heard. Pulmonary/Chest: Effort normal and breath sounds normal. She has no wheezes. She has no rales. She exhibits no tenderness.  Abdominal: Soft. Bowel sounds are normal. She exhibits no distension and no mass. There is no  tenderness.  Musculoskeletal: Normal range of motion.  Neurological: She is alert and oriented to person, place, and time.  Skin: Skin is warm and dry.  Psychiatric: She has a normal mood and affect.     Assessment & Plan:   1. DUB (dysfunctional uterine bleeding) Improved Schedule for Pap smear next office visit -  norgestimate-ethinyl estradiol (SPRINTEC 28) 0.25-35 MG-MCG tablet; Take 1 tablet by mouth daily.  Dispense: 1 Package; Refill: 11  2. Epistaxis Advised that this could be secondary to the heating device in the home Also could be from URI or sinusitis Conservative management, patient reassured   3. Gastritis and gastroduodenitis Continue PPI Advised to take it first thing in the morning and to avoid recumbent position so after meals   4. Other constipation - lactulose (CHRONULAC) 10 GM/15ML solution; Take 15 mLs (10 g total) by mouth 2 (two) times daily as needed for mild constipation.  Dispense: 946 mL; Refill: 0   We'll send off fasting labs, CBC, thyroid to evaluate her symptoms.  Meds ordered this encounter  Medications  . norgestimate-ethinyl estradiol (SPRINTEC 28) 0.25-35 MG-MCG tablet    Sig: Take 1 tablet by mouth daily.    Dispense:  1 Package    Refill:  11  . lactulose (CHRONULAC) 10 GM/15ML solution    Sig: Take 15 mLs (10 g total) by mouth 2 (two) times daily as needed for mild constipation.    Dispense:  946 mL    Refill:  0    Follow-up: Return in about 1 month (around 03/09/2016) for complete physical exam.   Jaclyn ShaggyEnobong Amao MD

## 2016-02-09 ENCOUNTER — Telehealth: Payer: Self-pay | Admitting: Family Medicine

## 2016-02-09 NOTE — Telephone Encounter (Signed)
Patient has been scheduled for fasting labs.

## 2016-02-09 NOTE — Telephone Encounter (Signed)
Could you please schedule this patient for fasting labs? Thanks

## 2016-02-17 ENCOUNTER — Ambulatory Visit: Payer: Self-pay | Attending: Family Medicine

## 2016-02-17 DIAGNOSIS — K2970 Gastritis, unspecified, without bleeding: Secondary | ICD-10-CM | POA: Insufficient documentation

## 2016-02-17 DIAGNOSIS — K2990 Gastroduodenitis, unspecified, without bleeding: Secondary | ICD-10-CM | POA: Insufficient documentation

## 2016-02-17 DIAGNOSIS — R5383 Other fatigue: Secondary | ICD-10-CM | POA: Insufficient documentation

## 2016-02-17 LAB — COMPLETE METABOLIC PANEL WITH GFR
ALBUMIN: 4.3 g/dL (ref 3.6–5.1)
ALK PHOS: 38 U/L (ref 33–115)
ALT: 15 U/L (ref 6–29)
AST: 14 U/L (ref 10–30)
BUN: 9 mg/dL (ref 7–25)
CALCIUM: 9.4 mg/dL (ref 8.6–10.2)
CO2: 26 mmol/L (ref 20–31)
Chloride: 103 mmol/L (ref 98–110)
Creat: 0.57 mg/dL (ref 0.50–1.10)
GFR, Est African American: >89 mL/min (ref >=60)
Glucose, Bld: 88 mg/dL (ref 65–99)
POTASSIUM: 4.3 mmol/L (ref 3.5–5.3)
SODIUM: 136 mmol/L (ref 135–146)
Total Bilirubin: 0.3 mg/dL (ref 0.2–1.2)
Total Protein: 7 g/dL (ref 6.1–8.1)

## 2016-02-17 LAB — LIPID PANEL
CHOL/HDL RATIO: 4.6 ratio (ref <5.0)
CHOLESTEROL: 192 mg/dL (ref <200)
HDL: 42 mg/dL — AB (ref >50)
LDL Cholesterol: 91 mg/dL (ref <100)
Triglycerides: 297 mg/dL — ABNORMAL HIGH (ref <150)
VLDL: 59 mg/dL — ABNORMAL HIGH (ref <30)

## 2016-02-17 LAB — TSH: TSH: 1.04 m[IU]/L

## 2016-02-17 LAB — CBC WITH DIFFERENTIAL/PLATELET
Basophils Absolute: 0 cells/uL (ref 0–200)
Basophils Relative: 0 %
EOS PCT: 2 %
Eosinophils Absolute: 104 cells/uL (ref 15–500)
HCT: 38.6 % (ref 35.0–45.0)
HEMOGLOBIN: 12.7 g/dL (ref 11.7–15.5)
LYMPHS ABS: 1092 {cells}/uL (ref 850–3900)
Lymphocytes Relative: 21 %
MCH: 27.1 pg (ref 27.0–33.0)
MCHC: 32.9 g/dL (ref 32.0–36.0)
MCV: 82.3 fL (ref 80.0–100.0)
MPV: 10.7 fL (ref 7.5–12.5)
Monocytes Absolute: 468 cells/uL (ref 200–950)
Monocytes Relative: 9 %
NEUTROS ABS: 3536 {cells}/uL (ref 1500–7800)
Neutrophils Relative %: 68 %
Platelets: 334 10*3/uL (ref 140–400)
RBC: 4.69 MIL/uL (ref 3.80–5.10)
RDW: 13.8 % (ref 11.0–15.0)
WBC: 5.2 10*3/uL (ref 3.8–10.8)

## 2016-02-17 NOTE — Progress Notes (Signed)
Patient here for lab visit only 

## 2016-02-18 LAB — VITAMIN D 25 HYDROXY (VIT D DEFICIENCY, FRACTURES): Vit D, 25-Hydroxy: 26 ng/mL — ABNORMAL LOW (ref 30–100)

## 2016-02-25 ENCOUNTER — Telehealth: Payer: Self-pay

## 2016-02-25 NOTE — Telephone Encounter (Signed)
Through [employer] writer was able to discuss lab results with her.  She stated understanding of these results.

## 2016-02-25 NOTE — Telephone Encounter (Signed)
-----   Message from Jaclyn ShaggyEnobong Amao, MD sent at 02/18/2016  1:42 PM EST ----- Labs are negative for anemia; cholesterol is normal but triglycerides are slightly elevated-advised to comply with low cholesterol diet, exercise. She also has a slightly low vitamin D which could explain fatigue and she can take over-the-counter vitamin D supplements for this.

## 2016-03-07 MED FILL — ?PANTOPRAZOLE SOD DR 40MG: 40 MG | 30 days supply | Qty: 30 | Fill #1

## 2016-03-07 MED FILL — CITALOPRAM HBR 20 MG TABLET: 20 | 30 days supply | Qty: 30 | Fill #3

## 2016-03-09 ENCOUNTER — Encounter: Payer: Self-pay | Admitting: Family Medicine

## 2016-03-09 ENCOUNTER — Ambulatory Visit: Payer: Self-pay | Attending: Family Medicine | Admitting: Family Medicine

## 2016-03-09 VITALS — BP 114/73 | HR 86 | Temp 98.9°F | Ht 64.5 in | Wt 173.0 lb

## 2016-03-09 DIAGNOSIS — Z0000 Encounter for general adult medical examination without abnormal findings: Secondary | ICD-10-CM | POA: Insufficient documentation

## 2016-03-09 DIAGNOSIS — Z79899 Other long term (current) drug therapy: Secondary | ICD-10-CM | POA: Insufficient documentation

## 2016-03-09 DIAGNOSIS — Z124 Encounter for screening for malignant neoplasm of cervix: Secondary | ICD-10-CM | POA: Insufficient documentation

## 2016-03-09 NOTE — Patient Instructions (Signed)
Tracie [name] (Health Maintenance, Female) Un estilo de vida saludable y los cuidados preventivos pueden favorecer considerablemente a la salud y Musician. Pregunte a su mdico cul es el cronograma de exmenes peridicos apropiado para usted. Esta es una buena oportunidad para consultarlo sobre cmo prevenir enfermedades y [place] sano. Adems de los controles, hay muchas otras cosas que puede hacer usted mismo. Los expertos han realizado numerosas investigaciones [employer] cambios en el estilo de vida y las medidas de prevencin que, [place], lo ayudarn a mantenerse sano. Solicite a su mdico ms informacin. EL PESO Y LA DIETA Consuma una dieta saludable.   Asegrese de [employer] verduras, frutas, productos lcteos de bajo contenido de [place] y Advertising account planner.  No consuma muchos alimentos de alto contenido de grasas slidas, azcares agregados o sal.  Realice actividad fsica con regularidad. Esta es una de las prcticas ms importantes que puede hacer por su salud.  La mayora de los adultos deben hacer ejercicio durante al menos 146mnutos por semana. El ejercicio debe aumentar la frecuencia cardaca y pActorla transpiracin (ejercicio de [place].  La mayora de los adultos tambin deben hField seismologistejercicios de elongacin al mToysRusveces a la semana. Agregue esto al su plan de ejercicio de intensidad moderada. Mantenga un peso saludable.   El ndice de masa corporal ([facility] es una medida que puede utilizarse para identificar posibles problemas de pMount Gay-Shamrock Proporciona una estimacin de la grasa corporal basndose en el peso y la altura. Su mdico puede ayudarle a dRadiation protection practitionerIMorleyy a lScientist, forensico mTheatre managerun peso saludable.  Para las mujeres de 20aos o ms:  Un IIredell Memorial Hospital, Incorporatedmenor de 18,5 se considera bajo peso.  Un IVision Care Of Mainearoostook LLCentre 18,5 y 24,9 es normal.  Un IDigestive Health Endoscopy Center LLCentre 25 y 29,9 se considera sobrepeso.  Un IMC de 30 o ms se considera  obesidad. Observe los niveles de colesterol y lpidos en la sangre.   Debe comenzar a rEnglish as a second language teacherde lpidos y cResearch officer, trade unionen la sangre a los 20aos y luego repetirlos cada 524aos  Es posible que nAutomotive engineerlos niveles de colesterol con mayor frecuencia si:  Sus niveles de lpidos y colesterol son altos.  Es mayor de 50aos.  Presenta un alto riesgo de padecer enfermedades cardacas. DETECCIN DE CNCER Cncer de pulmn   Se recomienda realizar exmenes de deteccin de cncer de pulmn a personas adultas entre 531y 858aos que estn en riesgo de dHorticulturist, commercialde pulmn por sus antecedentes de consumo de tabaco.  Se recomienda una tomografa computarizada de baja dosis de los pLiberty Mediaaos a las personas que:  Fuman actualmente.  Hayan dejado el hbito en algn momento en los ltimos 15aos.  Hayan fumado durante 30aos un paquete diario. Un paquete-ao equivale a fumar un promedio de un paquete de cigarrillos diario durante un ao.  Los exmenes de deteccin anuales deben continuar hasta que hayan pasado 15aos desde que dej de fumar.  Ya no debern realizarse si tiene un problema de salud que le impida recibir tratamiento para eScience writerde pulmn. Cncer de mama   Practique la autoconciencia de la mama. Esto significa reconocer la apariencia normal de sus mamas y cmo las siente.  Tambin significa realizar autoexmenes regulares de lJohnson & Johnson Informe a su mdico sobre cualquier cambio, sin importar cun pequeo sea.  Si tiene entre 20 y 361aos, un mdico debe realizarle un examen clnico de las mamas como parte del examen regular  de salud, cada 1 a 3aos.  Si tiene 40aos o ms, debe Information systems manager clnico de las Microsoft. Tambin considere realizarse una [place]) todos los [place].  Si tiene antecedentes familiares de cncer de mama, hable con su mdico para someterse a un estudio  gentico.  Si tiene alto riesgo de Chief Financial Officer de mama, hable con su mdico para someterse a Public house manager y [employer].  La evaluacin del gen del cncer de mama (BRCA) se recomienda a mujeres que tengan familiares con cnceres relacionados con el BRCA. Los cnceres relacionados con el BRCA incluyen los siguientes:  Mama.  Ovario.  Trompas.  Cnceres de peritoneo.  Los resultados de la evaluacin determinarn la necesidad de asesoramiento gentico y de [place] de BRCA1 y BRCA2. Cncer de cuello del tero  El mdico puede recomendarle que se haga pruebas peridicas de deteccin de cncer de los rganos de la pelvis (ovarios, tero y vagina). Estas pruebas incluyen un examen plvico, que abarca controlar si se produjeron cambios microscpicos en la superficie del cuello del tero (prueba de Papanicolaou). Pueden recomendarle que se haga estas pruebas cada 3aos, a partir de los 21aos.  A las mujeres que tienen entre 30 y 53aos, los mdicos pueden recomendarles que se sometan a exmenes plvicos y pruebas de Papanicolaou cada 5aos, o a la prueba de Papanicolaou y el examen plvico en combinacin con estudios de deteccin del virus del papiloma humano (VPH) cada 5aos. Algunos tipos de VPH aumentan el riesgo de Chief Financial Officer de cuello del tero. La prueba para la deteccin del VPH tambin puede realizarse a mujeres de cualquier edad cuyos resultados de la prueba de Papanicolaou no sean claros.  Es posible que otros mdicos no recomienden exmenes de deteccin a mujeres no embarazadas que se consideran sujetos de bajo riesgo de Chief Financial Officer de pelvis y que no tienen sntomas. Pregntele al mdico si un examen plvico de deteccin es adecuado para usted.  Si ha recibido un tratamiento para Science writer cervical o una enfermedad que podra causar cncer, necesitar realizarse una prueba de Papanicolaou y controles durante al menos 3 aos de concluido el  [place]. Si no se ha hecho el Papanicolaou con regularidad, debern volver a evaluarse los factores de riesgo (como tener un nuevo compaero sexual), para Teacher, adult education si debe realizarse los estudios nuevamente. Algunas mujeres sufren problemas mdicos que aumentan la probabilidad de Museum/gallery curator cncer de cuello del tero. En estos casos, el mdico podr [employer] se realicen controles y pruebas de Papanicolaou con ms frecuencia. Cncer colorrectal   Este tipo de cncer puede detectarse y a menudo prevenirse.  Por lo general, los estudios de rutina se deben Medical laboratory scientific officer a Field seismologist a Proofreader de los 17 aos y [place] 64 aos.  Sin embargo, el mdico podr aconsejarle que lo haga antes, si tiene factores de riesgo para el cncer de colon.  Tambin puede recomendarle que use un kit de prueba para Hydrologist en la materia fecal.  Es posible que se use una pequea cmara en el extremo de un tubo para examinar directamente el colon (sigmoidoscopia o colonoscopia) a fin de Hydrographic surveyor formas tempranas de cncer colorrectal.  Los exmenes de rutina generalmente comienzan a los 87aos.  El examen directo del colon se debe repetir cada 5 a 10aos hasta los 75aos. Sin embargo, es posible que se realicen exmenes con mayor frecuencia, si se detectan formas tempranas de plipos precancerosos o pequeos bultos.  Cncer de piel   Revise la piel de la cabeza a los pies con regularidad.  Informe a su mdico si aparecen nuevos lunares o los que tiene se modifican, especialmente en su forma y color.  Tambin notifique al mdico si tiene un lunar que es ms grande que el tamao de una goma de lpiz.  Siempre use pantalla solar. Aplique pantalla solar de [place] D[place] y repetida a lo largo del Training and development officer.  Protjase usando mangas y [employer], un sombrero de ala ancha y gafas para el sol, siempre que se encuentre en el exterior. ENFERMEDADES CARDACAS, DIABETES E HIPERTENSIN ARTERIAL  La hipertensin  arterial causa enfermedades cardacas y [place] el riesgo de ictus. La hipertensin arterial es ms probable en los siguientes casos:  Las personas que tienen la presin arterial en el extremo del rango normal (100-139/85-89 mm Hg).  Las personas con sobrepeso u obesidad.  Las Retail banker.  Si usted tiene entre 18 y 39 aos, debe medirse la presin arterial cada 3 a 5 aos. Si usted tiene 40 aos o ms, debe medirse la presin arterial [employer]. Debe medirse la presin arterial dos veces: una vez cuando est en un hospital o una clnica y la otra vez cuando est en otro sitio. Registre el promedio de [employer]. Para controlar su presin arterial cuando no est en un hospital o [place], puede usar lo siguiente:  Ardelia Mems mquina automtica para medir la presin arterial en una farmacia.  Un monitor para medir la presin arterial en el hogar.  Si tiene entre 46 y 81 aos, consulte a su mdico si debe tomar aspirina para prevenir el ictus.  Realcese exmenes de deteccin de la diabetes con regularidad. Esto incluye la toma de [place] de sangre para controlar el nivel de azcar en la sangre durante el [place].  Si tiene un peso normal y un bajo riesgo de padecer diabetes, realcese este anlisis cada tres aos despus de los 45aos.  Si tiene sobrepeso y un alto riesgo de padecer diabetes, considere someterse a este anlisis antes o con mayor frecuencia. PREVENCIN DE INFECCIONES HepatitisB   Si tiene un riesgo ms alto de Museum/gallery curator hepatitis B, debe someterse a un examen de deteccin de este virus. Se considera que tiene un alto riesgo de Museum/gallery curator hepatitis B si:  Naci en un pas donde la hepatitis B es frecuente. Pregntele a su mdico qu pases son considerados de Public affairs consultant.  Sus padres nacieron en un pas de alto riesgo y usted no recibi una vacuna que lo proteja contra la hepatitis B (vacuna contra la hepatitis B).  [place].  [place] agujas  para inyectarse drogas.  Vive con alguien que tiene hepatitis B.  Ha tenido sexo con alguien que tiene hepatitis B.  Recibe tratamiento de hemodilisis.  Toma ciertos medicamentos para el cncer, trasplante de rganos y afecciones autoinmunitarias. Hepatitis C   Se recomienda un anlisis de [place] para:  Todos los que nacieron entre 1945 y [phone].  Todas las personas que tengan un riesgo de haber contrado hepatitis C. Enfermedades de transmisin sexual (ETS).   Debe realizarse pruebas de deteccin de enfermedades de transmisin sexual (ETS), incluidas gonorrea y clamidia si:  Es sexualmente activo y es menor de 24aos.  Es mayor de 24aos, y Investment banker, operational informa que corre riesgo de tener este tipo de infecciones.  La actividad sexual ha cambiado desde que le hicieron la ltima prueba de deteccin y tiene un riesgo  mayor de tener clamidia o gonorrea. Pregntele al mdico si usted tiene riesgo.  Si no tiene el VIH, pero corre riesgo de infectarse por el virus, se recomienda tomar diariamente un medicamento recetado para evitar la infeccin. Esto se conoce como profilaxis previa a la exposicin. Se considera que est en riesgo si:  Es [place] sexualmente y no [place] preservativos habitualmente o no conoce el estado del VIH de sus Advertising copywriter.  Se inyecta drogas.  Es [place] sexualmente con Ardelia Mems pareja que tiene VIH. Consulte a su mdico para saber si tiene un alto riesgo de infectarse por el VIH. Si opta por comenzar la profilaxis previa a la exposicin, primero debe realizarse anlisis de deteccin del VIH. Luego, le harn anlisis cada 80mses mientras est tomando los medicamentos para la profilaxis previa a la exposicin. [facility] Si es premenopusica y puede quedar eLonerock solicite a su mdico asesoramiento previo a la concepcin.  Si puede quedar embarazada, tome 400 a [id number](mcg) de cido fAnheuser-Busch  Si desea evitar el embarazo, hable con su  mdico sobre el control de la natalidad (anticoncepcin). OSTEOPOROSIS Y MENOPAUSIA  La osteoporosis es una enfermedad en la que los huesos pierden los minerales y la fuerza por el avance de la edad. El resultado pueden ser fracturas graves en los h[place] El riesgo de osteoporosis puede identificarse con uArdelia Memsprueba de densidad sea.  Si tiene 65aos o ms, o si est en riesgo de sufrir osteoporosis y fracturas, pregunte a su mdico si debe someterse a exmenes.  Consulte a su mdico si debe tomar un suplemento de calcio o de vitamina D para reducir el riesgo de osteoporosis.  La menopausia puede presentar ciertos sntomas fsicos y rGaffer  La terapia de reemplazo hormonal puede reducir algunos de estos sntomas y rGaffer Consulte a su mdico para saber si la terapia de reemplazo hormonal es conveniente para usted. INSTRUCCIONES PARA EL CUIDADO EN EL HOGAR  Realcese los estudios de rutina de la salud, dentales y de lPublic librarian  [place]  No consuma ningn producto que contenga tabaco, lo que incluye cigarrillos, tabaco de mHigher education careers advisero cPsychologist, sport and exercise  Si est embarazada, no beba alcohol.  Si est amamantando, reduzca el consumo de alcohol y la frecuencia con la que consume.  Si es mujer y no est embarazada limite el consumo de alcohol a no ms de 1 medida por da. Una medida equivale a 12onzas de cerveza, 5onzas de vino o 1onzas de bebidas alcohlicas de alta graduacin.  No consuma drogas.  No comparta agujas.  Solicite ayuda a su mdico si necesita apoyo o informacin para abandonar las drogas.  Informe a su mdico si a menudo se siente deprimido.  Notifique a su mdico si alguna vez ha sido vctima de abuso o si no se siente seguro en su hogar. Esta informacin no tiene cMarine scientistel consejo del mdico. Asegrese de hacerle al mdico cualquier pregunta que tenga. Document Released: 02/24/2011 Document Revised: 03/28/2014 Document  Reviewed: 12/09/2014 Elsevier Interactive Patient Education  2017 [employer]

## 2016-03-09 NOTE — Progress Notes (Signed)
Subjective:  Patient ID: Tracie [name], female    DOB: 1980/05/25  Age: 35 y.o. MRN: [id number]  CC: Annual Exam and Vaginal Discharge (foul odor, clear/pink)   HPI Tracie Dickenslma [name] presents for A complete physical exam. She does have some occasional vaginal discharge but denies vaginal itching. I have placed on oral contraceptive pills for abnormal uterine bleeding with resulting improvement in symptoms and her PPI does relieve her epigastric pain.  Past Medical History:  Diagnosis Date  . Headache     History reviewed. No pertinent surgical history.  No Known Allergies   Outpatient Medications Prior to Visit  Medication Sig Dispense Refill  . citalopram (CELEXA) 20 MG tablet Take 1 tablet (20 mg total) by mouth daily. 90 tablet 1  . hydrocortisone-pramoxine (ANALPRAM HC) 2.5-1 % rectal cream Place 1 application rectally 3 (three) times daily. 30 g 1  . hydrOXYzine (ATARAX/VISTARIL) 10 MG tablet Take 10 mg by mouth 3 (three) times daily as needed.    . norgestimate-ethinyl estradiol (SPRINTEC 28) 0.25-35 MG-MCG tablet Take 1 tablet by mouth daily. 1 Package 11  . pantoprazole (PROTONIX) 40 MG tablet Take 1 tablet (40 mg total) by mouth daily. 30 tablet 3  . HYDROcodone-acetaminophen (NORCO) 5-325 MG tablet Take 1 tablet by mouth every 6 (six) hours as needed for moderate pain. (Patient not taking: Reported on 03/09/2016) 12 tablet 0  . lactulose (CHRONULAC) 10 GM/15ML solution Take 15 mLs (10 g total) by mouth 2 (two) times daily as needed for mild constipation. (Patient not taking: Reported on 03/09/2016) 946 mL 0   No facility-administered medications prior to visit.     ROS Review of Systems  Constitutional: Negative for activity change, appetite change and fatigue.  HENT: Negative for congestion, sinus pressure and sore throat.   Eyes: Negative for visual disturbance.  Respiratory: Negative for cough, chest tightness, shortness of breath and wheezing.     Cardiovascular: Negative for chest pain and palpitations.  Gastrointestinal: Negative for abdominal distention, abdominal pain and constipation.  Endocrine: Negative for polydipsia.  Genitourinary: Positive for vaginal discharge. Negative for dysuria and frequency.  Musculoskeletal: Negative for arthralgias and back pain.  Skin: Negative for rash.  Neurological: Negative for tremors, light-headedness and numbness.  Hematological: Does not bruise/bleed easily.  Psychiatric/Behavioral: Negative for agitation and behavioral problems.    Objective:  BP 114/73 (BP Location: Right Arm, Patient Position: Sitting, Cuff Size: Small)   Pulse 86   Temp 98.9 F (37.2 C) (Oral)   Ht 5' 4.5" (1.638 m)   Wt 173 lb (78.5 kg)   LMP 02/18/2016   SpO2 100%   BMI 29.24 kg/m   BP/Weight 03/09/2016 02/08/2016 01/30/2016  Systolic BP 114 122 121  Diastolic BP 73 79 74  Wt. (Lbs) 173 170.8 -  BMI 29.24 29.32 -      Physical Exam  Constitutional: She is oriented to person, place, and time. She appears well-developed and well-nourished. No distress.  HENT:  Head: Normocephalic.  Right Ear: External ear normal.  Left Ear: External ear normal.  Nose: Nose normal.  Mouth/Throat: Oropharynx is clear and moist.  Eyes: Conjunctivae and EOM are normal. Pupils are equal, round, and reactive to light.  Neck: Normal range of motion. No JVD present.  Cardiovascular: Normal rate, regular rhythm, normal heart sounds and intact distal pulses.  Exam reveals no gallop.   No murmur heard. Pulmonary/Chest: Effort normal and breath sounds normal. No respiratory distress. She has no wheezes. She has no rales. She  exhibits no tenderness. Right breast exhibits no mass, no nipple discharge and no tenderness. Left breast exhibits no mass, no nipple discharge and no tenderness.  Abdominal: Soft. Bowel sounds are normal. She exhibits no distension and no mass. There is no tenderness.  Genitourinary:  Genitourinary  Comments: Normal external genitalia, normal vagina, normal cervix, normal adnexa.  Musculoskeletal: Normal range of motion. She exhibits no edema or tenderness.  Neurological: She is alert and oriented to person, place, and time. She has normal reflexes.  Skin: Skin is warm and dry. She is not diaphoretic.  Psychiatric: She has a normal mood and affect.     Assessment & Plan:   1. Screening for cervical cancer - Cytology - [facility]  2. Annual physical exam    No orders of the defined types were placed in this encounter.   Follow-up: Return in about 6 months (around 09/07/2016) for coordination of care.   Jaclyn ShaggyEnobong Amao MD

## 2016-03-11 LAB — CYTOLOGY - PAP
Chlamydia: NEGATIVE
Diagnosis: NEGATIVE
Neisseria Gonorrhea: NEGATIVE
Trichomonas: NEGATIVE

## 2016-03-12 LAB — CERVICOVAGINAL ANCILLARY ONLY
Bacterial vaginitis: NEGATIVE
Candida vaginitis: NEGATIVE

## 2016-03-15 LAB — CERVICOVAGINAL ANCILLARY ONLY: Herpes: NEGATIVE

## 2016-03-16 ENCOUNTER — Telehealth: Payer: Self-pay

## 2016-03-16 NOTE — Telephone Encounter (Signed)
-----   Message from Enobong Amao, MD sent at 03/16/2016  8:48 AM EST ----- Please inform the patient that labs are normal. Thank you. 

## 2016-03-16 NOTE — Telephone Encounter (Signed)
-----   Message from Jaclyn ShaggyEnobong Amao, MD sent at 03/16/2016  8:48 AM EST ----- Please inform the patient that labs are normal. Thank you.

## 2016-03-16 NOTE — Telephone Encounter (Signed)
Writer called patient and LVM requesting patient to call back for lab results.

## 2016-03-16 NOTE — Telephone Encounter (Signed)
Patient called back and was able to get her lab results.

## 2016-03-17 MED FILL — MONO-LINYAH 28 TABLET: 0.25-35 | 28 days supply | Qty: 28 | Fill #1

## 2016-03-22 ENCOUNTER — Encounter: Payer: Self-pay | Admitting: Family Medicine

## 2016-03-22 ENCOUNTER — Ambulatory Visit: Payer: Self-pay | Attending: Family Medicine | Admitting: Family Medicine

## 2016-03-22 VITALS — BP 161/86 | HR 87 | Temp 98.5°F | Ht 64.5 in | Wt 170.6 lb

## 2016-03-22 DIAGNOSIS — Z79899 Other long term (current) drug therapy: Secondary | ICD-10-CM | POA: Insufficient documentation

## 2016-03-22 DIAGNOSIS — R030 Elevated blood-pressure reading, without diagnosis of hypertension: Secondary | ICD-10-CM | POA: Insufficient documentation

## 2016-03-22 DIAGNOSIS — J322 Chronic ethmoidal sinusitis: Secondary | ICD-10-CM | POA: Insufficient documentation

## 2016-03-22 DIAGNOSIS — J0120 Acute ethmoidal sinusitis, unspecified: Secondary | ICD-10-CM

## 2016-03-22 MED ORDER — CETIRIZINE HCL 10 MG PO TABS: 10.0000 mg | ORAL_TABLET | Freq: Every day | ORAL | 1 refills | Status: DC

## 2016-03-22 MED ORDER — AMOXICILLIN 500 MG PO CAPS: 500.0000 mg | ORAL_CAPSULE | Freq: Three times a day (TID) | ORAL | 0 refills | Status: DC

## 2016-03-22 MED FILL — ?CETIRIZINE HCL 10 MG TABLE: 10 | 30 days supply | Qty: 30 | Fill #0

## 2016-03-22 MED FILL — ?AMOXICILLIN 500 MG CAPSULE: 500 | 10 days supply | Qty: 30 | Fill #0

## 2016-03-22 NOTE — Progress Notes (Signed)
Subjective:  Patient ID: Tracie [name], female    DOB: 04/10/1980  Age: 36 y.o. MRN: [id number]  CC: Facial Pain; Dizziness; Epistaxis ("when I blow my nose"); and Otalgia   HPI Tracie [name] presents Complaining of a one-week history of facial pain, nasal congestion productive of bloody tinged discharge, otalgia, dizziness and some headache. She denies fever. Used OTC decongestants and sinus remedies with no relief in symptoms. Denies sick contacts.  Past Medical History:  Diagnosis Date  . Headache     History reviewed. No pertinent surgical history.  No Known Allergies    Outpatient Medications Prior to Visit  Medication Sig Dispense Refill  . citalopram (CELEXA) 20 MG tablet Take 1 tablet (20 mg total) by mouth daily. 90 tablet 1  . hydrOXYzine (ATARAX/VISTARIL) 10 MG tablet Take 10 mg by mouth 3 (three) times daily as needed.    . norgestimate-ethinyl estradiol (SPRINTEC 28) 0.25-35 MG-MCG tablet Take 1 tablet by mouth daily. 1 Package 11  . pantoprazole (PROTONIX) 40 MG tablet Take 1 tablet (40 mg total) by mouth daily. 30 tablet 3  . HYDROcodone-acetaminophen (NORCO) 5-325 MG tablet Take 1 tablet by mouth every 6 (six) hours as needed for moderate pain. (Patient not taking: Reported on 03/22/2016) 12 tablet 0  . hydrocortisone-pramoxine (ANALPRAM HC) 2.5-1 % rectal cream Place 1 application rectally 3 (three) times daily. (Patient not taking: Reported on 03/22/2016) 30 g 1  . lactulose (CHRONULAC) 10 GM/15ML solution Take 15 mLs (10 g total) by mouth 2 (two) times daily as needed for mild constipation. (Patient not taking: Reported on 03/22/2016) 946 mL 0   No facility-administered medications prior to visit.     ROS Review of Systems  Constitutional: Negative for activity change and appetite change.  HENT:       See hpi  Respiratory: Negative for chest tightness, shortness of breath and wheezing.   Cardiovascular: Negative for chest pain and palpitations.    Gastrointestinal: Negative for abdominal distention, abdominal pain and constipation.  Genitourinary: Negative.   Musculoskeletal: Negative.   Neurological: Positive for light-headedness.  Psychiatric/Behavioral: Negative for behavioral problems and dysphoric mood.    Objective:  BP (!) 161/86 (BP Location: Right Arm, Patient Position: Sitting, Cuff Size: Small)   Pulse 87   Temp 98.5 F (36.9 C) (Oral)   Ht 5' 4.5" (1.638 m)   Wt 170 lb 9.6 oz (77.4 kg)   LMP 02/18/2016   SpO2 100%   BMI 28.83 kg/m   BP/Weight 03/22/2016 03/09/2016 02/08/2016  Systolic BP 161 114 122  Diastolic BP 86 73 79  Wt. (Lbs) 170.6 173 170.8  BMI 28.83 29.24 29.32      Physical Exam  Constitutional: She is oriented to person, place, and time. She appears well-developed and well-nourished.  HENT:  Right Ear: External ear normal.  Left Ear: External ear normal.  Mouth/Throat: Oropharynx is clear and moist.  Maxillary sinus tenderness to palpation Normal oropharynx  Cardiovascular: Normal rate, normal heart sounds and intact distal pulses.   No murmur heard. Pulmonary/Chest: Effort normal and breath sounds normal. She has no wheezes. She has no rales. She exhibits no tenderness.  Abdominal: Soft. Bowel sounds are normal. She exhibits no distension and no mass. There is no tenderness.  Musculoskeletal: Normal range of motion.  Neurological: She is alert and oriented to person, place, and time.     Assessment & Plan:   1. Acute non-recurrent ethmoidal sinusitis Placed on amoxicillin and Zyrtec Use Tylenol as needed  2. Elevated blood pressure Likely due to use of OTC decongestants Low-sodium diet  Meds ordered this encounter  Medications  . amoxicillin (AMOXIL) 500 MG capsule    Sig: Take 1 capsule (500 mg total) by mouth 3 (three) times daily.    Dispense:  30 capsule    Refill:  0  . cetirizine (ZYRTEC) 10 MG tablet    Sig: Take 1 tablet (10 mg total) by mouth daily.    Dispense:   30 tablet    Refill:  1    Follow-up: Return if symptoms worsen or fail to improve.   Jaclyn ShaggyEnobong Amao MD

## 2016-03-22 NOTE — Patient Instructions (Signed)
Sinusitis en adultos  (Sinusitis, Adult)  La sinusitis es la inflamación y el dolor en los senos paranasales. Los senos paranasales son espacios vacíos en los huesos alrededor del rostro. Los senos paranasales se encuentran ubicados:  · Alrededor de los ojos.  · En la mitad de la frente.  · Detrás de la nariz.  · En los pómulos.  Los senos y las fosas nasales están cubiertos de un líquido fibroso (mucosidad). Normalmente, la mucosidad drena a través de los senos. Cuando los tejidos nasales se inflaman o hinchan, la mucosidad puede quedar atrapada o bloqueada, de modo que no puede fluir por los senos paranasales. Esto fomenta la proliferación de bacterias, virus y hongos, lo que produce infecciones.  La sinusitis puede desarrollarse rápidamente y durar entre 7 y 10 días (aguda) o más de 12 días (crónica). A menudo, esta afección surge después de un resfriado.  CAUSAS  Esta afección es causada por cualquier sustancia que inflame los senos o evite que la mucosidad drene, por ejemplo:  · Alergias.  · Asma.  · Infecciones virales o bacterianas.  · Huesos con forma anómala entre las fosas nasales.  · Crecimientos nasales que contienen mucosidad (pólipos nasales).  · Aberturas sinusales estrechas.  · Agentes contaminantes, como sustancias químicas o irritantes presentes en el aire.  · Un cuerpo extraño atorado en la nariz.  · Infecciones por hongos. Esto es raro.  FACTORES DE RIESGO  Los siguientes factores pueden hacer que usted sea propenso a sufrir esta afección:  · Padecer alergias o asma.  · Haber tenido una infección reciente en las vías respiratorias superiores o un resfriado.  · Tener deformidades estructurales o bloqueos en la nariz o los senos.  · Tener un sistema inmunitario débil.  · Nadar o bucear mucho.  · Abusar de los aerosoles nasales.  · Fumar.  SÍNTOMAS  Los principales síntomas de esta afección son dolor y sensación de presión alrededor de los senos afectados. Otros síntomas pueden ser los  siguientes:  · Dolor en los dientes superiores.  · Dolor de oídos.  · Dolor de cabeza.  · Mal aliento.  · Disminución del sentido del olfato y del gusto.  · Tos que empeora por la noche.  · Fatiga.  · Fiebre.  · Mucosidad espesa que sale de la nariz. Generalmente, es de color verde y puede contener pus (purulento).  · Nariz tapada o congestión nasal.  · Goteo posnasal. Esto ocurre cuando se acumula mucosidad adicional en la garganta o la parte de atrás de la nariz.  · Hinchazón y calor en los senos paranasales afectados.  · Dolor de garganta.  · Sensibilidad a la luz.  DIAGNÓSTICO  Esta enfermedad se diagnostica en función de los síntomas, los antecedentes médicos y un examen físico. Para descubrir si su afección es aguda o crónica, el médico podría hacer lo siguiente:  · Revisar su nariz en busca de pólipos nasales.  · Palpará los senos paranasales afectados para buscar signos de infección.  · Observará la parte interna de los senos paranasales con un dispositivo que tiene una luz (endoscopio).  Si el médico sospecha que usted padece sinusitis crónica, podría indicarle lo siguiente:  · Pruebas de alergias.  · Una muestra de mucosidad de la nariz (cultivo nasal) para buscar bacterias.  · Examen de una muestra de mucosidad, para ver si la sinusitis se relaciona con alguna alergia.  Si la sinusitis no responde al tratamiento y dura más de 8 semanas, se le podría pedir una resonancia magnética o   una tomografía computarizada para examinar los senos paranasales. Estos estudios también ayudan a determinar la gravedad de la infección.  En contadas ocasiones, se puede ordenar una biopsia de hueso para descartar tipos más graves de infecciones por hongos en los senos paranasales.  TRATAMIENTO  El tratamiento para la sinusitis depende de la causa y de si la afección es crónica o aguda. Si lo que causa la sinusitis es un virus, los síntomas desparecerán por sí solos en el término de 10 días. Podrían recetarle medicamentos para  aliviar los síntomas, entre los que se incluyen los siguientes:  · Descongestivos nasales tópicos. Desinflaman las fosas nasales y permiten que la mucosidad drene por los senos paranasales.  · Antihistamínicos. Este tipo de medicamento bloquea la inflamación que ocasionan las alergias. Pueden ayudar a reducir la inflamación en la nariz y los senos.  · Corticoides nasales tópicos. Son aerosoles nasales que reducen la inflamación e hinchazón en la nariz y los senos.  · Lavados nasales con solución salina. Estos enjuagues pueden ayudar a eliminar la mucosidad espesa en la nariz.  Si la afección es causada por una bacteria, se le recetarán antibióticos. Si es causada por un hongo, se le recetarán antimicóticos.  Se podría necesitar cirugía para tratar enfermedades preexistentes, como las fosas nasales estrechas. También podría ser necesaria para eliminar pólipos.  INSTRUCCIONES PARA EL CUIDADO EN EL HOGAR  Medicamentos  · Tome, use o aplíquese los medicamentos de venta libre y recetados solamente como se lo haya indicado el médico. Estos pueden incluir aerosoles nasales.  · Si le recetaron un antibiótico, tómelo como se lo haya indicado el médico. No deje de tomar los antibióticos aunque comience a sentirse mejor.  Hidrátese y humidifique los ambientes.  · Beba suficiente agua para mantener la orina clara o de color amarillo pálido. Mantenerse hidratado lo ayudará a diluir la mucosidad.  · Use un humidificador de vapor frío para mantener la humedad de su hogar por encima del 50 %.  · Realice inhalaciones de vapor por 10 a 15 minutos, de 3 a 4 veces al día o tal como se lo haya indicado el médico. Puede hacer esto en el baño con el vapor del agua caliente de la ducha.  · Limite la exposición al aire frío o seco.  Reposo  · Descanse todo lo que pueda.  · Duerma con la cabeza elevada.  · Asegúrese de dormir lo suficiente cada noche.  Instrucciones generales  · Aplíquese un paño tibio y húmedo en la cara 3 o 4 veces al día  o como se lo haya indicado el médico. Esto ayuda a calmar las molestias.  · Lávese las manos frecuentemente con agua y jabón para reducir la exposición a virus y otras bacterias. Use desinfectante para manos si no dispone de agua y jabón.  · No fume. Evite estar cerca de personas que fuman (fumador pasivo).  · Concurra a todas las visitas de control como se lo haya indicado el médico. Esto es importante.  SOLICITE ATENCIÓN MÉDICA SI:  · Tiene fiebre.  · Los síntomas empeoran.  · Los síntomas no mejoran en el término de 10 días.    SOLICITE ATENCIÓN MÉDICA DE INMEDIATO SI:  · Tiene un dolor de cabeza intenso.  · Tiene vómitos persistentes.  · Tiene dolor o hinchazón en la zona del rostro o los ojos.  · Tiene problemas de visión.  · Se siente confundido.  · Tiene el cuello rígido.  · Tiene dificultad para respirar.    Esta información no tiene como   fin reemplazar el consejo del médico. Asegúrese de hacerle al médico cualquier pregunta que tenga.  Document Released: 12/15/2004 Document Revised: 06/29/2015 Document Reviewed: 12/31/2014  Elsevier Interactive Patient Education © 2017 [employer]

## 2016-03-29 ENCOUNTER — Telehealth: Payer: Self-pay | Admitting: *Deleted

## 2016-03-29 NOTE — Telephone Encounter (Signed)
Pt arrived [facility], c/o sinus pain and pressure in head and nose. Has not completed ATB that was prescribed Jan. 2. And continues to take zyrtec with no relief. Also has weakness and chills.  VSS: T: 98.7  P: 96  R: 20 BP: 113/78 SpO2: 98  No acute distress presented by patient, VSS, breathing WNL. Pt encouraged to make appointment to see a provider or f/u at Urgent Care.  Interpreter Doy MinceKryssia assisted with translation.

## 2016-03-29 NOTE — Telephone Encounter (Signed)
Advised to complete course of antibiotic, increase fluid intake and use analgesics.

## 2016-03-30 NOTE — Telephone Encounter (Signed)
Interpreter: No. [phone]  Tracie [name]  Used for translation. Pt  is aware of message. She states she made apt with provider tomorrow but and feeling better. She may want to keep the apt but will decide by the end of the day.

## 2016-03-31 ENCOUNTER — Ambulatory Visit: Payer: Self-pay

## 2016-04-12 MED FILL — ?CITALOPRAM HBR 20 MG TABLE: 20 | 30 days supply | Qty: 30 | Fill #4

## 2016-04-22 MED FILL — ?PANTOPRAZOLE SOD DR 40MG: 40 MG | 30 days supply | Qty: 30 | Fill #2

## 2016-04-27 ENCOUNTER — Ambulatory Visit: Payer: Self-pay | Attending: Family Medicine

## 2016-04-27 ENCOUNTER — Other Ambulatory Visit: Payer: Self-pay | Admitting: Internal Medicine

## 2016-04-27 MED FILL — HYDROXYZINE PAM 25 MG CAP: 25 | 30 days supply | Qty: 90 | Fill #1

## 2016-06-10 MED FILL — ?CITALOPRAM HBR 20 MG TABLE: 20 | 30 days supply | Qty: 30 | Fill #5

## 2016-06-22 ENCOUNTER — Ambulatory Visit: Payer: Self-pay

## 2016-06-29 ENCOUNTER — Ambulatory Visit: Payer: Self-pay | Attending: Family Medicine

## 2016-07-08 MED FILL — hydrOXYzine HCL 10 MG TABS: 10 | 30 days supply | Qty: 90 | Fill #0

## 2016-07-08 MED FILL — ?CETIRIZINE HCL 10 MG TABLE: 10 | 30 days supply | Qty: 30 | Fill #1

## 2016-07-13 ENCOUNTER — Ambulatory Visit: Payer: Self-pay | Attending: Family Medicine | Admitting: Family Medicine

## 2016-07-13 ENCOUNTER — Encounter: Payer: Self-pay | Admitting: Family Medicine

## 2016-07-13 VITALS — BP 111/69 | HR 88 | Temp 98.5°F | Ht 64.5 in | Wt 172.6 lb

## 2016-07-13 DIAGNOSIS — J324 Chronic pansinusitis: Secondary | ICD-10-CM

## 2016-07-13 DIAGNOSIS — K2990 Gastroduodenitis, unspecified, without bleeding: Secondary | ICD-10-CM

## 2016-07-13 DIAGNOSIS — R040 Epistaxis: Secondary | ICD-10-CM

## 2016-07-13 DIAGNOSIS — F419 Anxiety disorder, unspecified: Secondary | ICD-10-CM

## 2016-07-13 DIAGNOSIS — F329 Major depressive disorder, single episode, unspecified: Secondary | ICD-10-CM

## 2016-07-13 DIAGNOSIS — K2970 Gastritis, unspecified, without bleeding: Secondary | ICD-10-CM

## 2016-07-13 DIAGNOSIS — Z79899 Other long term (current) drug therapy: Secondary | ICD-10-CM | POA: Insufficient documentation

## 2016-07-13 MED ORDER — FLUTICASONE PROPIONATE 50 MCG/ACT NA SUSP: 2.0000 | Freq: Every day | NASAL | 3 refills | Status: DC

## 2016-07-13 MED ORDER — HYDROXYZINE HCL 10 MG PO TABS: 10.0000 mg | ORAL_TABLET | Freq: Three times a day (TID) | ORAL | 3 refills | Status: DC | PRN

## 2016-07-13 MED ORDER — CITALOPRAM HYDROBROMIDE 20 MG PO TABS: 20.0000 mg | ORAL_TABLET | Freq: Every day | ORAL | 3 refills | Status: DC

## 2016-07-13 MED ORDER — CETIRIZINE HCL 10 MG PO TABS: 10.0000 mg | ORAL_TABLET | Freq: Every day | ORAL | 3 refills | Status: DC

## 2016-07-13 MED ORDER — OLOPATADINE HCL 0.1 % OP SOLN: 1.0000 [drp] | Freq: Two times a day (BID) | OPHTHALMIC | 2 refills | Status: DC

## 2016-07-13 MED ORDER — PANTOPRAZOLE SODIUM 40 MG PO TBEC: 40.0000 mg | DELAYED_RELEASE_TABLET | Freq: Every day | ORAL | 3 refills | Status: DC

## 2016-07-13 MED FILL — ?CITALOPRAM HBR 20 MG TABLE: 20 | 30 days supply | Qty: 30 | Fill #0

## 2016-07-13 MED FILL — OLOPATADINE HCL 0.1% EYE DR: 0.1 | 40 days supply | Qty: 5 | Fill #0

## 2016-07-13 MED FILL — FLUTICASONE PROP 50 MCG SPR: 50 | 30 days supply | Qty: 16 | Fill #0

## 2016-07-13 MED FILL — PANTOPRAZOLE SOD DR 40 MG T: 40 | 30 days supply | Qty: 30 | Fill #0

## 2016-07-13 NOTE — Progress Notes (Signed)
Subjective:  Patient ID: Tracie [name], female    DOB: 08/07/1980  Age: 36 y.o. MRN: [id number]  CC: nosebleeds (3 x's); pressure between eyes; Headache; Dizziness; and ear pressure (bilateral)   HPI Tracie [name] is a 36 year old female with a history of depression and anxiety, gastritis who presents for follow-up visit.  She complains of frontal headaches which are frequent, pressure in her ears, dizziness over the last couple of weeks. She endorses nasal congestion but has no fevers and has been taking Zyrtec consistently. She has had 3 episodes of nosebleeds since her last appointment 3 months ago with her last episode occurring last week and this resolved spontaneously.  Her depression and anxiety are controlled on Celexa and hydroxyzine; she was previously undergoing therapy which she has since discontinued. Abdominal symptoms are controlled on Protonix.  Past Medical History:  Diagnosis Date  . Headache     History reviewed. No pertinent surgical history.  No Known Allergies  Outpatient Medications Prior to Visit  Medication Sig Dispense Refill  . cetirizine (ZYRTEC) 10 MG tablet Take 1 tablet (10 mg total) by mouth daily. 30 tablet 1  . citalopram (CELEXA) 20 MG tablet Take 1 tablet (20 mg total) by mouth daily. 90 tablet 1  . hydrOXYzine (ATARAX/VISTARIL) 10 MG tablet Take 10 mg by mouth 3 (three) times daily as needed.    . loratadine (CLARITIN) 10 MG tablet Take 10 mg by mouth daily.    . pantoprazole (PROTONIX) 40 MG tablet Take 1 tablet (40 mg total) by mouth daily. 30 tablet 3  . HYDROcodone-acetaminophen (NORCO) 5-325 MG tablet Take 1 tablet by mouth every 6 (six) hours as needed for moderate pain. (Patient not taking: Reported on 03/22/2016) 12 tablet 0  . hydrocortisone-pramoxine (ANALPRAM HC) 2.5-1 % rectal cream Place 1 application rectally 3 (three) times daily. (Patient not taking: Reported on 03/22/2016) 30 g 1  . Phenylephrine-Acetaminophen (VICKS  DAYQUIL SINEX) 5-325 MG CAPS Take by mouth.    [name] Kitchen amoxicillin (AMOXIL) 500 MG capsule Take 1 capsule (500 mg total) by mouth 3 (three) times daily. 30 capsule 0  . hydrOXYzine (ATARAX/VISTARIL) 10 MG tablet TAKE 1 TABLET BY MOUTH 3 TIMES DAILY AS NEEDED 90 tablet 0  . lactulose (CHRONULAC) 10 GM/15ML solution Take 15 mLs (10 g total) by mouth 2 (two) times daily as needed for mild constipation. (Patient not taking: Reported on 03/22/2016) 946 mL 0  . norgestimate-ethinyl estradiol (SPRINTEC 28) 0.25-35 MG-MCG tablet Take 1 tablet by mouth daily. (Patient not taking: Reported on 07/13/2016) 1 Package 11   No facility-administered medications prior to visit.     ROS Review of Systems  Constitutional: Negative for activity change, appetite change and fatigue.  HENT:       See hpi  Eyes: Negative for visual disturbance.  Respiratory: Negative for cough, chest tightness, shortness of breath and wheezing.   Cardiovascular: Negative for chest pain and palpitations.  Gastrointestinal: Negative for abdominal distention, abdominal pain and constipation.  Endocrine: Negative for polydipsia.  Genitourinary: Negative for dysuria and frequency.  Musculoskeletal: Negative for arthralgias and back pain.  Skin: Negative for rash.  Neurological: Positive for headaches. Negative for tremors, light-headedness and numbness.  Hematological: Does not bruise/bleed easily.  Psychiatric/Behavioral: Negative for agitation and behavioral problems.    Objective:  BP 111/69 (BP Location: Right Arm, Patient Position: Sitting, Cuff Size: Small)   Pulse 88   Temp 98.5 F (36.9 C) (Oral)   Ht 5' 4.5" (1.638 m)   Wt  172 lb 9.6 oz (78.3 kg)   SpO2 98%   BMI 29.17 kg/m   BP/Weight 07/13/2016 03/22/2016 03/09/2016  Systolic BP 111 161 114  Diastolic BP 69 86 73  Wt. (Lbs) 172.6 170.6 173  BMI 29.17 28.83 29.24      Physical Exam  Constitutional: She is oriented to person, place, and time. She appears  well-developed and well-nourished.  HENT:  Right Ear: External ear normal.  Left Ear: External ear normal.  Oropharyngeal erythema with Postnasal drip  Cardiovascular: Normal rate, normal heart sounds and intact distal pulses.   No murmur heard. Pulmonary/Chest: Effort normal and breath sounds normal. She has no wheezes. She has no rales. She exhibits no tenderness.  Abdominal: Soft. Bowel sounds are normal. She exhibits no distension and no mass. There is no tenderness.  Musculoskeletal: Normal range of motion.  Neurological: She is alert and oriented to person, place, and time.  Skin: Skin is warm and dry.  Psychiatric: She has a normal mood and affect.     Assessment & Plan:   1. Gastritis and gastroduodenitis Stable - pantoprazole (PROTONIX) 40 MG tablet; Take 1 tablet (40 mg total) by mouth daily.  Dispense: 30 tablet; Refill: 3  2. Epistaxis Due to chronic sinusitis Educated on measures to stop any the future episodes  3. Chronic pansinusitis We will add Flonase to her regimen - cetirizine (ZYRTEC) 10 MG tablet; Take 1 tablet (10 mg total) by mouth daily.  Dispense: 30 tablet; Refill: 3 - fluticasone (FLONASE) 50 MCG/ACT nasal spray; Place 2 sprays into both nostrils daily.  Dispense: 16 g; Refill: 3  4. Anxiety and depression Controlled - citalopram (CELEXA) 20 MG tablet; Take 1 tablet (20 mg total) by mouth daily.  Dispense: 90 tablet; Refill: 3 - hydrOXYzine (ATARAX/VISTARIL) 10 MG tablet; Take 1 tablet (10 mg total) by mouth 3 (three) times daily as needed.  Dispense: 90 tablet; Refill: 3   Meds ordered this encounter  Medications  . citalopram (CELEXA) 20 MG tablet    Sig: Take 1 tablet (20 mg total) by mouth daily.    Dispense:  90 tablet    Refill:  3  . pantoprazole (PROTONIX) 40 MG tablet    Sig: Take 1 tablet (40 mg total) by mouth daily.    Dispense:  30 tablet    Refill:  3  . cetirizine (ZYRTEC) 10 MG tablet    Sig: Take 1 tablet (10 mg total) by  mouth daily.    Dispense:  30 tablet    Refill:  3  . hydrOXYzine (ATARAX/VISTARIL) 10 MG tablet    Sig: Take 1 tablet (10 mg total) by mouth 3 (three) times daily as needed.    Dispense:  90 tablet    Refill:  3  . fluticasone (FLONASE) 50 MCG/ACT nasal spray    Sig: Place 2 sprays into both nostrils daily.    Dispense:  16 g    Refill:  3  . olopatadine (PATANOL) 0.1 % ophthalmic solution    Sig: Place 1 drop into both eyes 2 (two) times daily.    Dispense:  5 mL    Refill:  2    Follow-up: Return in about 3 months (around 10/12/2016) for follow up of chronic medical conditions.   Jaclyn Shaggy MD

## 2016-08-11 ENCOUNTER — Ambulatory Visit (HOSPITAL_COMMUNITY)
Admission: EM | Admit: 2016-08-11 | Discharge: 2016-08-11 | Disposition: A | Discharge status: Home / Self Care | Payer: Self-pay | Attending: Emergency Medicine | Admitting: Emergency Medicine

## 2016-08-11 ENCOUNTER — Encounter (HOSPITAL_COMMUNITY): Payer: Self-pay | Admitting: Emergency Medicine

## 2016-08-11 DIAGNOSIS — H60332 Swimmer's ear, left ear: Secondary | ICD-10-CM

## 2016-08-11 MED ORDER — CIPROFLOXACIN-HYDROCORTISONE 0.2-1 % OT SUSP: 3.0000 [drp] | Freq: Two times a day (BID) | OTIC | 0 refills | Status: DC

## 2016-08-11 MED ORDER — NEOMYCIN-POLYMYXIN-HC 3.5-10000-1 OT SUSP: 4.0000 [drp] | Freq: Three times a day (TID) | OTIC | 0 refills | Status: DC

## 2016-08-11 NOTE — Discharge Instructions (Signed)
You have otitis externa, I have prescribed Cortisporin eardrops, apply 3 drops in the ear 4 times a day for one week. If your symptoms persist, return to clinic in one week, if symptoms worsen go to the ER.

## 2016-08-11 NOTE — ED Triage Notes (Signed)
Pt c/o left ear pain onset 2 days .... Voices no other concerns  A&O x4... NAD... Ambulatory

## 2016-08-11 NOTE — ED Provider Notes (Signed)
CSN: [id number]     Arrival date & time 08/11/16  1203 History   First MD Initiated Contact with Patient 08/11/16 1234     Chief Complaint  Patient presents with  . Otalgia   (Consider location/radiation/quality/duration/timing/severity/associated sxs/prior Treatment) 36 year old female presents to clinic with a chief complaint of left ear pain worsening over the previous 2 days.   The history is provided by the patient.  Otalgia  Location:  Left Behind ear:  No abnormality Quality:  Aching, pressure and throbbing Severity:  Moderate Onset quality:  Gradual Duration:  2 days Timing:  Constant Progression:  Worsening Chronicity:  New Context: not direct blow, not elevation change, not foreign body in ear, not recent URI and not water in ear   Relieved by:  None tried Worsened by:  Palpation and position Ineffective treatments:  None tried Associated symptoms: no abdominal pain, no congestion, no cough, no diarrhea, no ear discharge, no fever, no headaches, no neck pain, no rhinorrhea, no sore throat, no tinnitus and no vomiting     Past Medical History:  Diagnosis Date  . Headache    History reviewed. No pertinent surgical history. History reviewed. No pertinent family history. Social History  Substance Use Topics  . Smoking status: Never Smoker  . Smokeless tobacco: Never Used  . Alcohol use Yes   OB History    No data available     Review of Systems  Constitutional: Negative for appetite change, chills and fever.  HENT: Positive for ear pain. Negative for congestion, ear discharge, rhinorrhea, sore throat and tinnitus.   Respiratory: Negative for cough and wheezing.   Cardiovascular: Negative for chest pain and palpitations.  Gastrointestinal: Negative for abdominal pain, diarrhea, nausea and vomiting.  Musculoskeletal: Negative for joint swelling, neck pain and neck stiffness.  Skin: Negative.   Neurological: Negative for light-headedness and headaches.     Allergies  Patient has no known allergies.  Home Medications   Prior to Admission medications   Medication Sig Start Date End Date Taking? Authorizing Provider  cetirizine (ZYRTEC) 10 MG tablet Take 1 tablet (10 mg total) by mouth daily. 07/13/16   Jaclyn ShaggyAmao, Enobong, MD  ciprofloxacin-hydrocortisone (CIPRO HC) otic suspension Place 3 drops into the left ear 2 (two) times daily. 08/11/16   Dorena BodoKennard, Anahis Furgeson, NP  citalopram (CELEXA) 20 MG tablet Take 1 tablet (20 mg total) by mouth daily. 07/13/16   Jaclyn ShaggyAmao, Enobong, MD  fluticasone (FLONASE) 50 MCG/ACT nasal spray Place 2 sprays into both nostrils daily. 07/13/16   Jaclyn ShaggyAmao, Enobong, MD  HYDROcodone-acetaminophen (NORCO) 5-325 MG tablet Take 1 tablet by mouth every 6 (six) hours as needed for moderate pain. Patient not taking: Reported on 03/22/2016 01/30/16   Elvina SidleLauenstein, Kurt, MD  hydrocortisone-pramoxine [facility](ANALPRAM HC) 2.5-1 % rectal cream Place 1 application rectally 3 (three) times daily. Patient not taking: Reported on 03/22/2016 01/18/16   Jaclyn ShaggyAmao, Enobong, MD  hydrOXYzine (ATARAX/VISTARIL) 10 MG tablet Take 1 tablet (10 mg total) by mouth 3 (three) times daily as needed. 07/13/16   Jaclyn ShaggyAmao, Enobong, MD  olopatadine (PATANOL) 0.1 % ophthalmic solution Place 1 drop into both eyes 2 (two) times daily. 07/13/16   Jaclyn ShaggyAmao, Enobong, MD  pantoprazole (PROTONIX) 40 MG tablet Take 1 tablet (40 mg total) by mouth daily. 07/13/16   Jaclyn ShaggyAmao, Enobong, MD  Phenylephrine-Acetaminophen (VICKS DAYQUIL SINEX) 5-325 MG CAPS Take by mouth.    [provider]   Meds Ordered and Administered this Visit  Medications - No data to display  BP 120/66 (  BP Location: Left Arm)   Pulse 92   Temp 98.4 F (36.9 C) (Oral)   Resp 16   LMP 07/27/2016   SpO2 100%  No data found.   Physical Exam  Constitutional: She is oriented to person, place, and time. She appears well-developed and well-nourished. No distress.  HENT:  Head: Normocephalic and atraumatic.  Right Ear: Tympanic  membrane and external ear normal.  Left Ear: There is swelling and tenderness.  Nose: Nose normal.  Mouth/Throat: Oropharynx is clear and moist.  Eyes: Conjunctivae are normal.  Neck: Normal range of motion.  Cardiovascular: Normal rate and regular rhythm.   Pulmonary/Chest: Effort normal and breath sounds normal.  Lymphadenopathy:    She has no cervical adenopathy.  Neurological: She is alert and oriented to person, place, and time.  Skin: Skin is warm and dry. Capillary refill takes less than 2 seconds. No rash noted. She is not diaphoretic. No erythema.  Psychiatric: She has a normal mood and affect. Her behavior is normal.  Nursing note and vitals reviewed.   Urgent Care Course     Procedures (including critical care time)  Labs Review Labs Reviewed - No data to display  Imaging Review No results found.      MDM   1. Acute swimmer's ear of left side    Acute otitis externa, prescribed Cortisporin eardrops. May take over-the-counter Tylenol or Motrin as needed for pain, follow-up with primary care, return to clinic in one week as needed.    Dorena Bodo, NP 08/11/16 1249

## 2016-08-23 MED FILL — ?CITALOPRAM HBR 20 MG TABLE: 20 | 30 days supply | Qty: 30 | Fill #1

## 2016-09-12 ENCOUNTER — Encounter: Payer: Self-pay | Admitting: Family Medicine

## 2016-09-12 ENCOUNTER — Ambulatory Visit: Payer: Self-pay | Attending: Family Medicine | Admitting: Family Medicine

## 2016-09-12 VITALS — BP 111/71 | HR 88 | Temp 98.1°F | Wt 174.2 lb

## 2016-09-12 DIAGNOSIS — E559 Vitamin D deficiency, unspecified: Secondary | ICD-10-CM | POA: Insufficient documentation

## 2016-09-12 DIAGNOSIS — R0602 Shortness of breath: Secondary | ICD-10-CM

## 2016-09-12 DIAGNOSIS — R5383 Other fatigue: Secondary | ICD-10-CM

## 2016-09-12 MED ORDER — ALBUTEROL SULFATE (2.5 MG/3ML) 0.083% IN NEBU: 2.5000 mg | INHALATION_SOLUTION | Freq: Four times a day (QID) | RESPIRATORY_TRACT | 1 refills | Status: DC | PRN

## 2016-09-12 MED FILL — ?ALBUTEROL SUL 2.5 MG/3 MLS: (2.5 MG/3ML | 15 days supply | Qty: 180 | Fill #0

## 2016-09-12 NOTE — Progress Notes (Signed)
Subjective:  Patient ID: Tracie [name], female    DOB: 1980-09-26  Age: 36 y.o. MRN: [id number]  CC: Fatigue  HPI Tracie [name] is a 36 year old female with a history of depression and anxiety, gastritis who presents today with a two-week history of fatigue with resulting inability to perform chores at home, associated shortness of breath on mild exertion, myalgias. She denies fever, postnasal drip. She denies chest pain or wheezing and has not used any over-the-counter medications. Denies a history of sick contact.  Review of her chart indicates vitamin D deficiency with a level of 26 in 01/2016 and no anemia with hemoglobin of 12.7  Past Medical History:  Diagnosis Date  . Headache     No past surgical history on file.  No Known Allergies   Outpatient Medications Prior to Visit  Medication Sig Dispense Refill  . cetirizine (ZYRTEC) 10 MG tablet Take 1 tablet (10 mg total) by mouth daily. 30 tablet 3  . citalopram (CELEXA) 20 MG tablet Take 1 tablet (20 mg total) by mouth daily. 90 tablet 3  . hydrOXYzine (ATARAX/VISTARIL) 10 MG tablet Take 1 tablet (10 mg total) by mouth 3 (three) times daily as needed. 90 tablet 3  . olopatadine (PATANOL) 0.1 % ophthalmic solution Place 1 drop into both eyes 2 (two) times daily. 5 mL 2  . fluticasone (FLONASE) 50 MCG/ACT nasal spray Place 2 sprays into both nostrils daily. (Patient not taking: Reported on 09/12/2016) 16 g 3  . HYDROcodone-acetaminophen (NORCO) 5-325 MG tablet Take 1 tablet by mouth every 6 (six) hours as needed for moderate pain. (Patient not taking: Reported on 03/22/2016) 12 tablet 0  . hydrocortisone-pramoxine (ANALPRAM HC) 2.5-1 % rectal cream Place 1 application rectally 3 (three) times daily. (Patient not taking: Reported on 03/22/2016) 30 g 1  . neomycin-polymyxin-hydrocortisone (CORTISPORIN) 3.5-10000-1 otic suspension Place 4 drops into the left ear 3 (three) times daily. (Patient not taking: Reported on  09/12/2016) 10 mL 0  . pantoprazole (PROTONIX) 40 MG tablet Take 1 tablet (40 mg total) by mouth daily. (Patient not taking: Reported on 09/12/2016) 30 tablet 3  . Phenylephrine-Acetaminophen (VICKS DAYQUIL SINEX) 5-325 MG CAPS Take by mouth.     No facility-administered medications prior to visit.     ROS Review of Systems  Constitutional: Positive for fatigue. Negative for activity change and appetite change.  HENT: Negative for congestion, sinus pressure and sore throat.   Eyes: Negative for visual disturbance.  Respiratory: Positive for shortness of breath. Negative for cough, chest tightness and wheezing.   Cardiovascular: Negative for chest pain and palpitations.  Gastrointestinal: Negative for abdominal distention, abdominal pain and constipation.  Endocrine: Negative for polydipsia.  Genitourinary: Negative for dysuria and frequency.  Musculoskeletal: Positive for myalgias. Negative for arthralgias and back pain.  Skin: Negative for rash.  Neurological: Negative for tremors, light-headedness and numbness.  Hematological: Does not bruise/bleed easily.  Psychiatric/Behavioral: Negative for agitation and behavioral problems.    Objective:  BP 111/71   Pulse 88   Temp 98.1 F (36.7 C) (Oral)   Wt 174 lb 3.2 oz (79 kg)   SpO2 100%   BMI 29.44 kg/m   BP/Weight 09/12/2016 08/11/2016 2/45/8099  Systolic BP 833 825 053  Diastolic BP 71 66 69  Wt. (Lbs) 174.2 - 172.6  BMI 29.44 - 29.17      Physical Exam  Constitutional: She is oriented to person, place, and time. She appears well-developed and well-nourished.  HENT:  Right Ear: External ear normal.  Left Ear: External ear normal.  Mouth/Throat: Oropharynx is clear and moist.  No sinus tenderness  Cardiovascular: Normal rate, normal heart sounds and intact distal pulses.   No murmur heard. Pulmonary/Chest: Effort normal and breath sounds normal. She has no wheezes. She has no rales. She exhibits no tenderness.    Abdominal: Soft. Bowel sounds are normal. She exhibits no distension and no mass. There is no tenderness.  Musculoskeletal: Normal range of motion.  Neurological: She is alert and oriented to person, place, and time.     Assessment & Plan:   1. Other fatigue VITAMIN D deficient in the fall of last year We'll repeat vitamin D again Could also be secondary to underlying viral syndrome especially in the setting of myalgias-advised seasonal just sick and increase fluid intake Discussed low intensity exercise - Vitamin D, 25-hydroxy - CBC with Differential/Platelet - CMP14+EGFR  2. Shortness of breath - albuterol (PROVENTIL) (2.5 MG/3ML) 0.083% nebulizer solution; Take 3 mLs (2.5 mg total) by nebulization every 6 (six) hours as needed for wheezing or shortness of breath.  Dispense: 150 mL; Refill: 1   Meds ordered this encounter  Medications  . albuterol (PROVENTIL) (2.5 MG/3ML) 0.083% nebulizer solution    Sig: Take 3 mLs (2.5 mg total) by nebulization every 6 (six) hours as needed for wheezing or shortness of breath.    Dispense:  150 mL    Refill:  1    Follow-up: Return if symptoms worsen or fail to improve, for follow up of chronic medical conditions lease keep previously scheduled appointment.Tracie Morale MD

## 2016-09-13 ENCOUNTER — Other Ambulatory Visit: Payer: Self-pay | Admitting: Family Medicine

## 2016-09-13 LAB — CBC WITH DIFFERENTIAL/PLATELET
BASOS ABS: 0 10*3/uL (ref 0.0–0.2)
BASOS: 0 %
EOS (ABSOLUTE): 0.1 10*3/uL (ref 0.0–0.4)
Eos: 2 %
Hematocrit: 35.9 % (ref 34.0–46.6)
Hemoglobin: 11.9 g/dL (ref 11.1–15.9)
IMMATURE GRANULOCYTES: 0 %
Immature Grans (Abs): 0 10*3/uL (ref 0.0–0.1)
Lymphocytes Absolute: 2.1 10*3/uL (ref 0.7–3.1)
Lymphs: 22 %
MCH: 27 pg (ref 26.6–33.0)
MCHC: 33.1 g/dL (ref 31.5–35.7)
MCV: 81 fL (ref 79–97)
MONOS ABS: 0.8 10*3/uL (ref 0.1–0.9)
Monocytes: 9 %
NEUTROS PCT: 67 %
Neutrophils Absolute: 6.5 10*3/uL (ref 1.4–7.0)
PLATELETS: 330 10*3/uL (ref 150–379)
RBC: 4.41 x10E6/uL (ref 3.77–5.28)
RDW: 14.1 % (ref 12.3–15.4)
WBC: 9.5 10*3/uL (ref 3.4–10.8)

## 2016-09-13 LAB — CMP14+EGFR
A/G RATIO: 1.7 (ref 1.2–2.2)
ALT: 15 IU/L (ref 0–32)
AST: 14 IU/L (ref 0–40)
Albumin: 4.3 g/dL (ref 3.5–5.5)
Alkaline Phosphatase: 58 IU/L (ref 39–117)
BILIRUBIN TOTAL: 0.2 mg/dL (ref 0.0–1.2)
BUN/Creatinine Ratio: 19 (ref 9–23)
BUN: 12 mg/dL (ref 6–20)
CALCIUM: 9.5 mg/dL (ref 8.7–10.2)
CHLORIDE: 102 mmol/L (ref 96–106)
CO2: 19 mmol/L — ABNORMAL LOW (ref 20–29)
Creatinine, Ser: 0.64 mg/dL (ref 0.57–1.00)
GFR calc Af Amer: 135 mL/min/{1.73_m2} (ref >59)
GFR, EST NON AFRICAN AMERICAN: 117 mL/min/{1.73_m2} (ref >59)
GLUCOSE: 86 mg/dL (ref 65–99)
Globulin, Total: 2.6 g/dL (ref 1.5–4.5)
POTASSIUM: 4.3 mmol/L (ref 3.5–5.2)
Sodium: 140 mmol/L (ref 134–144)
Total Protein: 6.9 g/dL (ref 6.0–8.5)

## 2016-09-13 LAB — VITAMIN D 25 HYDROXY (VIT D DEFICIENCY, FRACTURES): Vit D, 25-Hydroxy: 17.6 ng/mL — ABNORMAL LOW (ref 30.0–100.0)

## 2016-09-13 MED ORDER — ERGOCALCIFEROL 1.25 MG (50000 UT) PO CAPS: 50000.0000 [IU] | ORAL_CAPSULE | ORAL | 1 refills | Status: DC

## 2016-09-13 MED FILL — VIT D2 1.25 MG (50,000 UNIT: 1.25 MG | 28 days supply | Qty: 4 | Fill #0

## 2016-09-22 ENCOUNTER — Telehealth: Payer: Self-pay

## 2016-09-22 NOTE — Telephone Encounter (Signed)
Pt was called and informed of lab results.([id number]) 

## 2016-09-28 MED FILL — CITALOPRAM HBR 20 MG TABLET: 20 | 30 days supply | Qty: 30 | Fill #2

## 2016-10-06 ENCOUNTER — Encounter (HOSPITAL_COMMUNITY): Payer: Self-pay | Admitting: Emergency Medicine

## 2016-10-06 ENCOUNTER — Ambulatory Visit (HOSPITAL_COMMUNITY)
Admission: EM | Admit: 2016-10-06 | Discharge: 2016-10-06 | Disposition: A | Discharge status: Home / Self Care | Payer: Self-pay | Attending: Family Medicine | Admitting: Family Medicine

## 2016-10-06 DIAGNOSIS — S39012A Strain of muscle, fascia and tendon of lower back, initial encounter: Secondary | ICD-10-CM | POA: Insufficient documentation

## 2016-10-06 DIAGNOSIS — R102 Pelvic and perineal pain: Secondary | ICD-10-CM | POA: Insufficient documentation

## 2016-10-06 DIAGNOSIS — R109 Unspecified abdominal pain: Secondary | ICD-10-CM

## 2016-10-06 DIAGNOSIS — Z79899 Other long term (current) drug therapy: Secondary | ICD-10-CM | POA: Insufficient documentation

## 2016-10-06 DIAGNOSIS — T148XXA Other injury of unspecified body region, initial encounter: Secondary | ICD-10-CM

## 2016-10-06 DIAGNOSIS — R110 Nausea: Secondary | ICD-10-CM | POA: Insufficient documentation

## 2016-10-06 DIAGNOSIS — N390 Urinary tract infection, site not specified: Secondary | ICD-10-CM | POA: Insufficient documentation

## 2016-10-06 DIAGNOSIS — Z3202 Encounter for pregnancy test, result negative: Secondary | ICD-10-CM

## 2016-10-06 DIAGNOSIS — X58XXXA Exposure to other specified factors, initial encounter: Secondary | ICD-10-CM | POA: Insufficient documentation

## 2016-10-06 DIAGNOSIS — M545 Low back pain: Secondary | ICD-10-CM | POA: Insufficient documentation

## 2016-10-06 LAB — POCT URINALYSIS DIP (DEVICE)
Bilirubin Urine: NEGATIVE
GLUCOSE, UA: NEGATIVE mg/dL
Hgb urine dipstick: NEGATIVE
KETONES UR: NEGATIVE mg/dL
Nitrite: NEGATIVE
PH: 6.5 (ref 5.0–8.0)
PROTEIN: NEGATIVE mg/dL
UROBILINOGEN UA: 0.2 mg/dL (ref 0.0–1.0)

## 2016-10-06 LAB — POCT PREGNANCY, URINE: PREG TEST UR: NEGATIVE

## 2016-10-06 MED ORDER — CEPHALEXIN 500 MG PO CAPS: 500.0000 mg | ORAL_CAPSULE | Freq: Four times a day (QID) | ORAL | 0 refills | Status: DC

## 2016-10-06 MED ORDER — NAPROXEN 375 MG PO TABS: 375.0000 mg | ORAL_TABLET | Freq: Two times a day (BID) | ORAL | 0 refills | Status: DC

## 2016-10-06 MED FILL — NAPROXEN 375 MG TABLET: 375 | 10 days supply | Qty: 20 | Fill #0

## 2016-10-06 MED FILL — CEPHALEXIN 500 MG CAPSULE: 500 | 7 days supply | Qty: 28 | Fill #0

## 2016-10-06 NOTE — ED Triage Notes (Addendum)
Pt reports lower abdominal and back pain, burning with urination, nausea, loss of appetite and upper leg pain x1 week.  Pt states [facility] the same symptoms and they did STD testing and some other testing she was not sure of and everything was negative.

## 2016-10-06 NOTE — ED Provider Notes (Signed)
CSN: [id number]     Arrival date & time 10/06/16  1238 History   First MD Initiated Contact with Patient 10/06/16 1346     Chief Complaint  Patient presents with  . Abdominal Pain  . Back Pain   (Consider location/radiation/quality/duration/timing/severity/associated sxs/prior Treatment) 36 year old Hispanic female presents to the urgent care complaining of one-week history of lower abdominal pain. She feels bloated at times. She also has pain in the lower back greater on the left than the right. Pain is worse with movement, bending, twisting or pulling. She is also had transient nausea and fatigue. Other symptoms include urinary frequency and urgency and malodorous urine. She had complained of lower abdominal/pelvic pain one week ago and saw her gynecologist. Gynecologist stated that all of her test were normal, no infections or other pathology found.      Past Medical History:  Diagnosis Date  . Headache    History reviewed. No pertinent surgical history. History reviewed. No pertinent family history. Social History  Substance Use Topics  . Smoking status: Never Smoker  . Smokeless tobacco: Never Used  . Alcohol use Yes   OB History    No data available     Review of Systems  Constitutional: Negative.  Negative for fever.  HENT: Negative.   Respiratory: Negative.   Cardiovascular: Negative.   Gastrointestinal: Positive for abdominal pain. Negative for constipation, diarrhea and rectal pain.  Genitourinary: Positive for dysuria, frequency and urgency. Negative for vaginal bleeding, vaginal discharge and vaginal pain.  Musculoskeletal: Positive for back pain and myalgias.  Neurological: Negative.     Allergies  Patient has no known allergies.  Home Medications   Prior to Admission medications   Medication Sig Start Date End Date Taking? Authorizing Provider  cetirizine (ZYRTEC) 10 MG tablet Take 1 tablet (10 mg total) by mouth daily. 07/13/16  Yes Jaclyn ShaggyAmao, Enobong, MD   citalopram (CELEXA) 20 MG tablet Take 1 tablet (20 mg total) by mouth daily. 07/13/16  Yes Jaclyn ShaggyAmao, Enobong, MD  hydrOXYzine (ATARAX/VISTARIL) 10 MG tablet Take 1 tablet (10 mg total) by mouth 3 (three) times daily as needed. 07/13/16  Yes Jaclyn ShaggyAmao, Enobong, MD  albuterol (PROVENTIL) (2.5 MG/3ML) 0.083% nebulizer solution Take 3 mLs (2.5 mg total) by nebulization every 6 (six) hours as needed for wheezing or shortness of breath. 09/12/16   Jaclyn ShaggyAmao, Enobong, MD  cephALEXin (KEFLEX) 500 MG capsule Take 1 capsule (500 mg total) by mouth 4 (four) times daily. 10/06/16   Hayden RasmussenMabe, Katelee Schupp, NP  ergocalciferol (DRISDOL) 50000 units capsule Take 1 capsule (50,000 Units total) by mouth once a week. 09/13/16   Jaclyn ShaggyAmao, Enobong, MD  fluticasone (FLONASE) 50 MCG/ACT nasal spray Place 2 sprays into both nostrils daily. Patient not taking: Reported on 09/12/2016 07/13/16   Jaclyn ShaggyAmao, Enobong, MD  HYDROcodone-acetaminophen (NORCO) 5-325 MG tablet Take 1 tablet by mouth every 6 (six) hours as needed for moderate pain. Patient not taking: Reported on 03/22/2016 01/30/16   Elvina SidleLauenstein, Kurt, MD  hydrocortisone-pramoxine [facility](ANALPRAM HC) 2.5-1 % rectal cream Place 1 application rectally 3 (three) times daily. Patient not taking: Reported on 03/22/2016 01/18/16   Jaclyn ShaggyAmao, Enobong, MD  naproxen (NAPROSYN) 375 MG tablet Take 1 tablet (375 mg total) by mouth 2 (two) times daily. For back and muscle pain 10/06/16   Hayden RasmussenMabe, Denetria Luevanos, NP  neomycin-polymyxin-hydrocortisone (CORTISPORIN) 3.5-10000-1 otic suspension Place 4 drops into the left ear 3 (three) times daily. Patient not taking: Reported on 09/12/2016 08/11/16   Dorena BodoKennard, Lawrence, NP  olopatadine (PATANOL) 0.1 % ophthalmic  solution Place 1 drop into both eyes 2 (two) times daily. 07/13/16   Jaclyn Shaggy, MD  pantoprazole (PROTONIX) 40 MG tablet Take 1 tablet (40 mg total) by mouth daily. Patient not taking: Reported on 09/12/2016 07/13/16   Jaclyn Shaggy, MD  Phenylephrine-Acetaminophen (VICKS DAYQUIL SINEX) 5-325  MG CAPS Take by mouth.    [provider]   Meds Ordered and Administered this Visit  Medications - No data to display  BP 115/79 (BP Location: Left Arm)   Pulse 92   Temp 98.4 F (36.9 C) (Oral)   LMP 09/20/2016 (Exact Date)   SpO2 100%  No data found.   Physical Exam  Constitutional: She is oriented to person, place, and time. She appears well-developed and well-nourished. No distress.  Eyes: EOM are normal.  Neck: Normal range of motion. Neck supple.  Cardiovascular: Normal rate.   Pulmonary/Chest: Effort normal. No respiratory distress.  Abdominal: She exhibits no distension. There is no rebound and no guarding.  Abdomen with normal bowel sounds and soft. Minor tenderness across the lower abdomen.  Musculoskeletal: She exhibits no edema.  Patient is able to flex her spine forward beyond 90. There is tenderness at the para lumbosacral musculature, left greater than right. No spinal tenderness, deformity, swelling or overlying skin discoloration. Pain is reproduced by having her bend over and then straightening up, rotating spine.  Neurological: She is alert and oriented to person, place, and time. She exhibits normal muscle tone.  Skin: Skin is warm and dry.  Psychiatric: She has a normal mood and affect.  Nursing note and vitals reviewed.   Urgent Care Course     Procedures (including critical care time)  Labs Review Labs Reviewed  POCT URINALYSIS DIP (DEVICE) - Abnormal; Notable for the following:       Result Value   Leukocytes, UA TRACE (*)    All other components within normal limits  URINE CULTURE  POCT PREGNANCY, URINE    Imaging Review No results found.   Visual Acuity Review  Right Eye Distance:   Left Eye Distance:   Bilateral Distance:    Right Eye Near:   Left Eye Near:    Bilateral Near:         MDM   1. Lower urinary tract infectious disease   2. Strain of lumbar region, initial encounter   3. Muscle strain    Limit heavy  lifting, bending or twisting. Perform stretches as demonstrated. Apply heat to the low back muscles. Take the antibiotic as directed and drink plenty of fluids. He may also take an over-the-counter medicine called AZO standard that helps with urinary symptoms. Follow-up with your primary care provider as needed. Be sure to drink plenty of fluids and stay well-hydrated. Video interpreter used. Meds ordered this encounter  Medications  . cephALEXin (KEFLEX) 500 MG capsule    Sig: Take 1 capsule (500 mg total) by mouth 4 (four) times daily.    Dispense:  28 capsule    Refill:  0    Order Specific Question:   Supervising Provider    Answer:   Mardella Layman [id number]  . naproxen (NAPROSYN) 375 MG tablet    Sig: Take 1 tablet (375 mg total) by mouth 2 (two) times daily. For back and muscle pain    Dispense:  20 tablet    Refill:  0    Order Specific Question:   Supervising Provider    Answer:   Mardella Layman [[id number]]  Hayden Rasmussen, NP 10/06/16 1424

## 2016-10-06 NOTE — Discharge Instructions (Signed)
Limit heavy lifting, bending or twisting. Perform stretches as demonstrated. Apply heat to the low back muscles. Take the antibiotic as directed and drink plenty of fluids. He may also take an over-the-counter medicine called AZO standard that helps with urinary symptoms. Follow-up with your primary care provider as needed. Be sure to drink plenty of fluids and stay well-hydrated.

## 2016-10-08 LAB — URINE CULTURE

## 2016-10-10 ENCOUNTER — Ambulatory Visit: Payer: Self-pay

## 2016-10-12 ENCOUNTER — Ambulatory Visit: Payer: Self-pay | Attending: Family Medicine | Admitting: Physician Assistant

## 2016-10-12 VITALS — BP 128/67 | HR 89 | Temp 99.3°F | Resp 16 | Wt 173.0 lb

## 2016-10-12 DIAGNOSIS — M545 Low back pain, unspecified: Secondary | ICD-10-CM

## 2016-10-12 DIAGNOSIS — K2970 Gastritis, unspecified, without bleeding: Secondary | ICD-10-CM | POA: Insufficient documentation

## 2016-10-12 DIAGNOSIS — R110 Nausea: Secondary | ICD-10-CM | POA: Insufficient documentation

## 2016-10-12 DIAGNOSIS — K2990 Gastroduodenitis, unspecified, without bleeding: Secondary | ICD-10-CM | POA: Insufficient documentation

## 2016-10-12 DIAGNOSIS — Z79899 Other long term (current) drug therapy: Secondary | ICD-10-CM | POA: Insufficient documentation

## 2016-10-12 DIAGNOSIS — R1030 Lower abdominal pain, unspecified: Secondary | ICD-10-CM | POA: Insufficient documentation

## 2016-10-12 LAB — POCT URINALYSIS DIPSTICK
BILIRUBIN UA: NEGATIVE
Glucose, UA: NEGATIVE
Ketones, UA: NEGATIVE
NITRITE UA: NEGATIVE
PH UA: 5.5 (ref 5.0–8.0)
PROTEIN UA: NEGATIVE
RBC UA: NEGATIVE
Spec Grav, UA: 1.01 (ref 1.010–1.025)
UROBILINOGEN UA: 0.2 U/dL

## 2016-10-12 LAB — POCT URINE PREGNANCY: PREG TEST UR: NEGATIVE

## 2016-10-12 MED ORDER — PANTOPRAZOLE SODIUM 40 MG PO TBEC: 40.0000 mg | DELAYED_RELEASE_TABLET | Freq: Every day | ORAL | 3 refills | Status: DC

## 2016-10-12 MED FILL — ?PANTOPRAZOLE SOD DR 40MG: 40 MG | 30 days supply | Qty: 30 | Fill #0

## 2016-10-12 MED FILL — VIT D2 1.25 MG (50,000 UNIT: 1.25 MG | 84 days supply | Qty: 12 | Fill #1

## 2016-10-12 NOTE — Progress Notes (Signed)
Patient ID: Tracie [name], female   DOB: 04/28/1980, 36 y.o.   MRN: [id number]     Tracie [name], is a 36 y.o. female  [id number]  [id number]  DOB - 04/28/1980  Subjective:  Chief Complaint and HPI: Tracie [name] is a 36 y.o. female here for Pain lower abdomen and lower back for about 1 1/2 weeks.  Also hands and feet feel swollen.  Also pain in B upper legs.  No vaginal discharge.  No f/c.  + nausea.  No vomiting. LMP: July 3rd and was normal.  [facility]  Seen in ED 10/06/2016 for same.  Treated for UTI with cephalexin but was told to stop taking it bc culture came back negative.   Appetite is normal.  Nausea is intermittent.  Pain is intermittent. No constipation or diarrhea.  No dysuria. Pain feels crampy in nature.  NKI to back/legs.  No new activities.  [employer] interpreters used.     ROS:   Constitutional:  No f/c, No night sweats, No unexplained weight loss. EENT:  No vision changes, No blurry vision, No hearing changes. No mouth, throat, or ear problems.  Respiratory: No cough, No SOB Cardiac: No CP, no palpitations GI:  + lower abd pain, + N No V/D. GU: No Urinary s/sx Musculoskeletal: No joint pain Neuro: No headache, no dizziness, no motor weakness.  Skin: No rash Endocrine:  No polydipsia. No polyuria.  Psych: Denies SI/HI  No problems updated.  ALLERGIES: No Known Allergies  PAST MEDICAL HISTORY: Past Medical History:  Diagnosis Date  . Headache     MEDICATIONS AT HOME: Prior to Admission medications   Medication Sig Start Date End Date Taking? Authorizing Provider  cetirizine (ZYRTEC) 10 MG tablet Take 1 tablet (10 mg total) by mouth daily. Patient not taking: Reported on 10/12/2016 07/13/16   Jaclyn ShaggyAmao, Enobong, MD  citalopram (CELEXA) 20 MG tablet Take 1 tablet (20 mg total) by mouth daily. 07/13/16   Jaclyn ShaggyAmao, Enobong, MD  ergocalciferol (DRISDOL) 50000 units capsule Take 1 capsule (50,000 Units total) by mouth once a week. 09/13/16    Jaclyn ShaggyAmao, Enobong, MD  hydrocortisone-pramoxine (ANALPRAM HC) 2.5-1 % rectal cream Place 1 application rectally 3 (three) times daily. Patient not taking: Reported on 03/22/2016 01/18/16   Jaclyn ShaggyAmao, Enobong, MD  hydrOXYzine (ATARAX/VISTARIL) 10 MG tablet Take 1 tablet (10 mg total) by mouth 3 (three) times daily as needed. 07/13/16   Jaclyn ShaggyAmao, Enobong, MD  naproxen (NAPROSYN) 375 MG tablet Take 1 tablet (375 mg total) by mouth 2 (two) times daily. For back and muscle pain Patient not taking: Reported on 10/12/2016 10/06/16   Hayden RasmussenMabe, David, NP  olopatadine (PATANOL) 0.1 % ophthalmic solution Place 1 drop into both eyes 2 (two) times daily. Patient not taking: Reported on 10/12/2016 07/13/16   Jaclyn ShaggyAmao, Enobong, MD  pantoprazole (PROTONIX) 40 MG tablet Take 1 tablet (40 mg total) by mouth daily. 10/12/16   Anders SimmondsMcClung, Angela M, PA-C  Phenylephrine-Acetaminophen (VICKS DAYQUIL SINEX) 5-325 MG CAPS Take by mouth.    [provider]     Objective:  EXAM:   Vitals:   10/12/16 1353  BP: 128/67  Pulse: 89  Resp: 16  Temp: 99.3 F (37.4 C)  TempSrc: Oral  SpO2: 99%  Weight: 173 lb (78.5 kg)    General appearance : A&OX3. NAD. Non-toxic-appearing HEENT: Atraumatic and Normocephalic.  PERRLA. EOM intact. Neck: supple, no JVD. No cervical lymphadenopathy. No thyromegaly Chest/Lungs:  Breathing-non-labored, Good air entry bilaterally, breath sounds normal without rales, rhonchi, or  wheezing  CVS: S1 S2 regular, no murmurs, gallops, rubs  Abdomen: Bowel sounds present, Non tender and not distended with no gaurding, rigidity or rebound. Minimal tenderness in lower general abdomen. Extremities: Bilateral Lower Ext shows no edema, both legs are warm to touch with = pulse throughout Neurology:  CN II-XII grossly intact, Non focal.   Psych:  TP linear. J/I WNL. Normal speech. Appropriate eye contact and affect.  Skin:  No Rash  Data Review Lab Results  Component Value Date   HGBA1C 5.6 10/23/2015   HGBA1C 5.5  09/09/2013     Assessment & Plan   1. Lower abdominal pain Unsure etiology-testing so far was negative.  Cephalexin was stopped - POCT urine pregnancy - Urine cytology ancillary only - POCT urinalysis dipstick - Comprehensive metabolic panel - H. pylori breath test  2. Nausea - POCT urine pregnancy - Comprehensive metabolic panel - H. pylori breath test  3. Acute low back pain without sciatica, unspecified back pain laterality Use ibuprofen or Naproxen  4. Gastritis and gastroduodenitis resume - pantoprazole (PROTONIX) 40 MG tablet; Take 1 tablet (40 mg total) by mouth daily.  Dispense: 30 tablet; Refill: 3  Patient have been counseled extensively about nutrition and exercise  Return in about 3 weeks (around 11/02/2016) for Dr Amao-chronic medical issues.  The patient was given clear instructions to go to ER or return to medical center if symptoms don't improve, worsen or new problems develop. The patient verbalized understanding. The patient was told to call to get lab results if they haven't heard anything in the next week.     Georgian CoAngela McClung, PA-C [facility] [place], [place] [phone]   10/12/2016, 2:11 PM

## 2016-10-13 LAB — COMPREHENSIVE METABOLIC PANEL
ALK PHOS: 56 IU/L (ref 39–117)
ALT: 13 IU/L (ref 0–32)
AST: 11 IU/L (ref 0–40)
Albumin/Globulin Ratio: 1.7 (ref 1.2–2.2)
Albumin: 4.5 g/dL (ref 3.5–5.5)
BUN/Creatinine Ratio: 18 (ref 9–23)
BUN: 9 mg/dL (ref 6–20)
CHLORIDE: 102 mmol/L (ref 96–106)
CO2: 23 mmol/L (ref 20–29)
Calcium: 9.7 mg/dL (ref 8.7–10.2)
Creatinine, Ser: 0.51 mg/dL — ABNORMAL LOW (ref 0.57–1.00)
GFR calc Af Amer: 145 mL/min/{1.73_m2} (ref >59)
GFR calc non Af Amer: 126 mL/min/{1.73_m2} (ref >59)
GLUCOSE: 82 mg/dL (ref 65–99)
Globulin, Total: 2.6 g/dL (ref 1.5–4.5)
Potassium: 4.1 mmol/L (ref 3.5–5.2)
Sodium: 140 mmol/L (ref 134–144)
TOTAL PROTEIN: 7.1 g/dL (ref 6.0–8.5)

## 2016-10-13 LAB — URINE CYTOLOGY ANCILLARY ONLY
CHLAMYDIA, DNA PROBE: NEGATIVE
Neisseria Gonorrhea: NEGATIVE
TRICH (WINDOWPATH): NEGATIVE

## 2016-10-13 LAB — H. PYLORI BREATH TEST: H pylori Breath Test: NEGATIVE

## 2016-10-14 LAB — URINE CYTOLOGY ANCILLARY ONLY
Bacterial vaginitis: NEGATIVE
Candida vaginitis: NEGATIVE

## 2016-10-17 ENCOUNTER — Telehealth: Payer: Self-pay | Admitting: *Deleted

## 2016-10-17 NOTE — Telephone Encounter (Signed)
-----   Message from Anders SimmondsAngela M McClung, [place]PA-C sent at 10/14/2016 11:33 AM EDT ----- Please call patient and tell her that her labs are normal and that her test for stomach ulcers was negative.    Thanks, Georgian CoAngela McClung, PA-C

## 2016-10-17 NOTE — Telephone Encounter (Signed)
Medical Assistant used [employer] to contact patient.  Interpreter Name: Jonette PesaGonzala Interpreter #: [id number] Patient was not available, [employer] left patient a voicemail. Patient is aware of labs being normal and H pylori being negative. Voicemail states to give a call back to [place] [facility] at [phone].

## 2016-10-25 ENCOUNTER — Ambulatory Visit: Payer: Self-pay | Admitting: Family Medicine

## 2016-10-31 ENCOUNTER — Ambulatory Visit: Payer: Self-pay | Attending: Family Medicine

## 2016-10-31 MED FILL — CITALOPRAM HBR 20 MG TABLET: 20 | 30 days supply | Qty: 30 | Fill #3

## 2016-11-03 ENCOUNTER — Telehealth: Payer: Self-pay

## 2016-11-03 NOTE — Telephone Encounter (Signed)
-----   Message from Margaretmary LombardNubia K [place], [place] sent at 11/03/2016  3:53 PM EDT ----- Please inform patient of results.

## 2016-11-03 NOTE — Telephone Encounter (Signed)
CMA call regarding lab results   Patient verify DOB  Patient was aware and understood  

## 2016-11-24 ENCOUNTER — Ambulatory Visit: Payer: Self-pay | Attending: Family Medicine | Admitting: Physician Assistant

## 2016-11-24 ENCOUNTER — Encounter (HOSPITAL_COMMUNITY): Payer: Self-pay

## 2016-11-24 ENCOUNTER — Ambulatory Visit (HOSPITAL_COMMUNITY)
Admission: RE | Admit: 2016-11-24 | Discharge: 2016-11-24 | Disposition: A | Discharge status: Home / Self Care | Payer: Self-pay | Source: Ambulatory Visit | Attending: Physician Assistant | Admitting: Physician Assistant

## 2016-11-24 VITALS — BP 110/75 | HR 85 | Temp 99.4°F | Resp 20 | Wt 177.0 lb

## 2016-11-24 DIAGNOSIS — R1030 Lower abdominal pain, unspecified: Secondary | ICD-10-CM | POA: Insufficient documentation

## 2016-11-24 DIAGNOSIS — Z79899 Other long term (current) drug therapy: Secondary | ICD-10-CM | POA: Insufficient documentation

## 2016-11-24 DIAGNOSIS — R109 Unspecified abdominal pain: Secondary | ICD-10-CM

## 2016-11-24 DIAGNOSIS — F419 Anxiety disorder, unspecified: Secondary | ICD-10-CM

## 2016-11-24 LAB — POCT URINALYSIS DIPSTICK
BILIRUBIN UA: NEGATIVE
Blood, UA: NEGATIVE
GLUCOSE UA: NEGATIVE
KETONES UA: NEGATIVE
Leukocytes, UA: NEGATIVE
Nitrite, UA: NEGATIVE
SPEC GRAV UA: 1.025 (ref 1.010–1.025)
Urobilinogen, UA: 0.2 E.U./dL
pH, UA: 6.5 (ref 5.0–8.0)

## 2016-11-24 LAB — POCT URINE PREGNANCY: Preg Test, Ur: NEGATIVE

## 2016-11-24 MED ORDER — SERTRALINE HCL 50 MG PO TABS: 50.0000 mg | ORAL_TABLET | Freq: Every day | ORAL | 3 refills | Status: DC

## 2016-11-24 MED ORDER — IOPAMIDOL (ISOVUE-300) INJECTION 61%
100.0000 mL | Freq: Once | INTRAVENOUS | Status: AC | PRN
  Administered 2016-11-24: 100 mL via INTRAVENOUS

## 2016-11-24 MED FILL — ?SERTRALINE HCL 50 MG TAB: 50 | 19 days supply | Qty: 30 | Fill #0

## 2016-11-24 NOTE — Progress Notes (Signed)
Patient ID: Tracie [name], female   DOB: 1980/09/24, 36 y.o.   MRN: [id number]     Tracie [name], is a 36 y.o. female  [id number]  [id number]  DOB - 1980/09/24  Subjective:  Chief Complaint and HPI: Tracie [name] is a 36 y.o. female here today for Pain in lower abdomen and pelvis that radiates into the R side of her back.  She has had this now for about 6 weeks and it is worsening.  No changes in BM.  No melena, no hematochezia.  No vaginal discharge(STI testing negative).  Pain comes and goes throughout the day.  Naproxen helps some.  No f/c. No vaginal discharge or vaginal symptoms. Appetite not affected.  Pain exacerbated by some movement/lifting things that are heavy.  While I am taking her history, she becomes tearful and says that she is anxious and scared of what might be causing her abdominal pain.  She also says she is just tearful and has a lot of anxiety in general for about the last 2 years.  She has been seen by psychiatrists in the past and tried on many medications.  Currently on Celexa and vistaril prn-doesn't feel that either are working.  She denies SI/HI.  She admits to excessive worry and "thinking something bad might happen."    Depression screen [facility] 2/9 11/24/2016 10/12/2016 07/13/2016  Decreased Interest 1 1 1   Down, Depressed, Hopeless 1 1 1   PHQ - 2 Score 2 2 2   Altered sleeping 1 1 1   Tired, decreased energy 1 1 1   Change in appetite 1 1 1   Feeling bad or failure about yourself  1 1 1   Trouble concentrating 1 1 1   Moving slowly or fidgety/restless 1 1 1   Suicidal thoughts 0 0 0  PHQ-9 Score 8 8 8   Difficult doing work/chores - - -  Some recent data might be hidden     Veterinary 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   ED/Hospital notes reviewed.     ROS:   Constitutional:  No f/c, No night sweats, No unexplained weight loss. EENT:  No vision changes, No blurry vision, No hearing changes. No mouth, throat, or ear problems.    Respiratory: No cough, No SOB Cardiac: No CP, no palpitations GI:  + abd pain, No N/V/D. + pelvic pain GU: No Urinary s/sx Musculoskeletal: No joint pain Neuro: No headache, no dizziness, no motor weakness.  Skin: No rash Endocrine:  No polydipsia. No polyuria.  Psych: Denies SI/HI  No problems updated.  ALLERGIES: No Known Allergies  PAST MEDICAL HISTORY: Past Medical History:  Diagnosis Date  . Headache     MEDICATIONS AT HOME: Prior to Admission medications   Medication Sig Start Date End Date Taking? Authorizing Provider  ergocalciferol (DRISDOL) 50000 units capsule Take 1 capsule (50,000 Units total) by mouth once a week. 09/13/16  Yes Jaclyn ShaggyAmao, Enobong, MD  hydrOXYzine (ATARAX/VISTARIL) 10 MG tablet Take 1 tablet (10 mg total) by mouth 3 (three) times daily as needed. 07/13/16  Yes Jaclyn ShaggyAmao, Enobong, MD  naproxen (NAPROSYN) 375 MG tablet Take 1 tablet (375 mg total) by mouth 2 (two) times daily. For back and muscle pain 10/06/16  Yes Mabe, Onalee Huaavid, NP  cetirizine (ZYRTEC) 10 MG tablet Take 1 tablet (10 mg total) by mouth daily. Patient not taking: Reported on 10/12/2016 07/13/16   Jaclyn ShaggyAmao, Enobong, MD  hydrocortisone-pramoxine (ANALPRAM HC) 2.5-1 % rectal cream Place 1 application rectally 3 (three) times daily. Patient not taking: Reported on 03/22/2016 01/18/16   Amao,  Enobong, MD  olopatadine (PATANOL) 0.1 % ophthalmic solution Place 1 drop into both eyes 2 (two) times daily. Patient not taking: Reported on 10/12/2016 07/13/16   Jaclyn Shaggy, MD  pantoprazole (PROTONIX) 40 MG tablet Take 1 tablet (40 mg total) by mouth daily. Patient not taking: Reported on 11/24/2016 10/12/16   Anders Simmonds, PA-C  Phenylephrine-Acetaminophen (VICKS DAYQUIL SINEX) 5-325 MG CAPS Take by mouth.    [provider]  sertraline (ZOLOFT) 50 MG tablet Take 1 tablet (50 mg total) by mouth daily. X 1 week then take 2 tablets  daily 11/24/16   Anders Simmonds, PA-C     Objective:  EXAM:   Vitals:    11/24/16 1129  BP: 110/75  Pulse: 85  Resp: 20  Temp: 99.4 F (37.4 C)  TempSrc: Oral  SpO2: 96%  Weight: 177 lb (80.3 kg)    General appearance : A&OX3. NAD. Non-toxic-appearing HEENT: Atraumatic and Normocephalic.  PERRLA. EOM intact.   Neck: supple, no JVD. No cervical lymphadenopathy. No thyromegaly Chest/Lungs:  Breathing-non-labored, Good air entry bilaterally, breath sounds normal without rales, rhonchi, or wheezing  CVS: S1 S2 regular, no murmurs, gallops, rubs  Abdomen: Bowel sounds present, Non-distended, generally tender in the lower abdomen, neg McBurney's, neg Murphy's.  Definitely non-acute abdomen Extremities: Bilateral Lower Ext shows no edema, both legs are warm to touch with = pulse throughout Neurology:  CN II-XII grossly intact, Non focal.   Psych:  TP linear. J/I WNL. Normal speech. Appropriate eye contact and affect.  Skin:  No Rash  Data Review Lab Results  Component Value Date   HGBA1C 5.6 10/23/2015   HGBA1C 5.5 09/09/2013     Assessment & Plan   1. Abdominal pain, recurrent STI testing since onset of pain was all negative, CMP unremarkable, H. Pylori is negative all since onset of pain.  Urine hcg/dip negative today - POCT urinalysis dipstick - POCT urine pregnancy  2. Anxiety Stop Celexa.  She has never tried Zoloft.  hasn't seen psychiatrist in about 8 months.  I have encouraged her that this might be helpful.  Resources on self-care and anxiety management given - TSH start- sertraline (ZOLOFT) 50 MG tablet; Take 1 tablet (50 mg total) by mouth daily. X 1 week then take 2 tablets  daily  Dispense: 30 tablet; Refill: 3  3. Lower abdominal pain - CT Abdomen Pelvis Wo Contrast; Future - CT Abdomen Pelvis W Contrast; Future  Patient have been counseled extensively about nutrition and exercise  Return in about 1 week (around 12/01/2016) for f/up with me in 2-3 weeks for anxiety and abdominal pain.  The patient was given clear instructions to go  to ER or return to medical center if symptoms don't improve, worsen or new problems develop. The patient verbalized understanding. The patient was told to call to get lab results if they haven't heard anything in the next week.     Georgian Co, PA-C [facility] and [facility] [place], [place] [phone]   11/24/2016, 1:15 PM

## 2016-11-24 NOTE — Progress Notes (Signed)
Pelvic and flank pain

## 2016-11-24 NOTE — Patient Instructions (Signed)
   Trastorno de ansiedad generalizada (Generalized Anxiety Disorder) El trastorno de ansiedad generalizada es un trastorno mental. Interfiere en las funciones vitales, incluyendo las relaciones, el trabajo y la escuela.  Es diferente de la ansiedad normal que todas las personas experimentan en algn momento de su vida en respuesta a sucesos y actividades especficas. En verdad, la ansiedad normal nos ayuda a prepararnos y atravesar estos acontecimientos y actividades de la vida. La ansiedad normal desaparece despus de que el evento o la actividad ha finalizado.  El trastorno de ansiedad generalizada no est necesariamente relacionada con eventos o actividades especficas. Tambin causa un exceso de ansiedad en proporcin a sucesos o actividades especficas. En este trastorno la ansiedad es difcil de controlar. Los sntomas pueden variar de leves a muy graves. Las personas que sufren de trastorno de ansiedad generalizada pueden tener intensas olas de ansiedad con sntomas fsicos (ataques de pnico).  SNTOMAS  La ansiedad y la preocupacin asociada a este trastorno son difciles de controlar. Esta ansiedad y la preocupacin estn relacionados con muchos eventos de la vida y sus actividades y tambin ocurre durante ms das de los que no ocurre, durante 6 meses o ms. Las personas que la sufren pueden tener tres o ms de los siguientes sntomas (uno o ms en los nios):   Agitacin   Fatiga.  Dificultades de concentracin.   Irritabilidad.  Tensin muscular  Dificultad para dormirse o sueo poco satisfactorio. DIAGNSTICO  Se diagnostica a travs de una evaluacin realizada por el mdico. El mdico le har preguntas acerca de su estado de nimo, sntomas fsicos y sucesos de su vida. Le har preguntas sobre su historia clnica, el consumo de alcohol o drogas, incluyendo los medicamentos recetados. Tambin le har un examen fsico e indicar anlisis de sangre. Ciertas enfermedades y el uso de  determinadas sustancias pueden causar sntomas similares a este trastorno. Su mdico lo puede derivar a un especialista en salud mental para una evaluacin ms profunda..  TRATAMIENTO  Las terapias siguientes se utilizan en el tratamiento de este trastorno:   Medicamentos - Se recetan antidepresivos para el control diario a largo plazo. Pueden indicarse tambin medicamentos para combatir la ansiedad en los casos graves, especialmente cuando ocurren ataques de pnico.   Terapia conversada (psicoterapia) Ciertos tipos de psicoterapia pueden ser tiles en el tratamiento del trastorno de ansiedad generalizada, proporcionando apoyo, educacin y orientacin. Una forma de psicoterapia llamada terapia cognitivo-conductual puede ensearle formas saludables de pensar y reaccionar a los eventos y actividades de la vida diaria.  Tcnicasde manejo del estrs- Estas tcnicas incluyen el yoga, la meditacin y el ejercicio y pueden ser muy tiles cuando se practican con regularidad. Un especialista en salud mental puede ayudar a determinar qu tratamiento es mejor para usted. Algunas personas obtienen mejora con una terapia. Sin embargo, otras personas requieren una combinacin de terapias.  Esta informacin no tiene como fin reemplazar el consejo del mdico. Asegrese de hacerle al mdico cualquier pregunta que tenga. Document Released: 07/02/2012 Document Revised: 03/28/2014 Elsevier Interactive Patient Education  2017 Elsevier Inc.  

## 2016-11-25 ENCOUNTER — Other Ambulatory Visit: Payer: Self-pay

## 2016-11-28 ENCOUNTER — Ambulatory Visit: Payer: Self-pay | Attending: Family Medicine

## 2016-11-28 DIAGNOSIS — F419 Anxiety disorder, unspecified: Secondary | ICD-10-CM | POA: Insufficient documentation

## 2016-11-28 NOTE — Progress Notes (Signed)
Patient here for lab visit only 

## 2016-11-29 LAB — TSH: TSH: 1.66 u[IU]/mL (ref 0.450–4.500)

## 2016-11-30 ENCOUNTER — Other Ambulatory Visit: Payer: Self-pay | Admitting: Physician Assistant

## 2016-11-30 ENCOUNTER — Telehealth: Payer: Self-pay | Admitting: *Deleted

## 2016-11-30 MED ORDER — NAPROXEN 500 MG PO TABS: 500.0000 mg | ORAL_TABLET | Freq: Two times a day (BID) | ORAL | 1 refills | Status: DC

## 2016-11-30 NOTE — Telephone Encounter (Signed)
Notes recorded by Anders SimmondsMcClung, Angela M, PA-C on 11/25/2016 at 11:31 AM EDT Please call patient. Her CT scan was normal. This means her pain is most likely due to muscle aches and pains. She can continue to use the Naproxen as needed for pain and follow-up with me as planned  Pt aware of results of CT scan. She does not have anymore naproxen. [facility]

## 2016-11-30 NOTE — Telephone Encounter (Signed)
I sent her a new prescription at a slightly higher dose to our pharmacy.  Thanks, Tracie CoAngela Yitzchak Kothari, PA-C

## 2016-12-01 ENCOUNTER — Ambulatory Visit: Payer: Self-pay | Attending: Family Medicine | Admitting: Physician Assistant

## 2016-12-01 ENCOUNTER — Encounter: Payer: Self-pay | Admitting: Physician Assistant

## 2016-12-01 VITALS — BP 111/75 | HR 81 | Temp 99.2°F | Resp 18 | Ht 64.0 in | Wt 176.6 lb

## 2016-12-01 DIAGNOSIS — R1011 Right upper quadrant pain: Secondary | ICD-10-CM

## 2016-12-01 DIAGNOSIS — F419 Anxiety disorder, unspecified: Secondary | ICD-10-CM

## 2016-12-01 NOTE — Patient Instructions (Addendum)
Practice deep breathing, increase water intake, eat regular healthy meals and try to include ~25 g protein in each meal.     Trastorno de ansiedad generalizada (Generalized Anxiety Disorder) El trastorno de ansiedad generalizada es un trastorno mental. Interfiere en las funciones vitales, incluyendo las Brownellrelaciones, el trabajo y la escuela.  Es diferente de la ansiedad normal que todas las personas experimentan en algn momento de su vida en respuesta a sucesos y Chief Operating Officeractividades especficas. En verdad, la ansiedad normal nos ayuda a prepararnos y Human resources officeratravesar estos acontecimientos y actividades de la vida. La ansiedad normal desaparece despus de que el evento o la actividad ha finalizado.  El trastorno de ansiedad generalizada no est necesariamente relacionada con eventos o actividades especficas. Tambin causa un exceso de ansiedad en proporcin a sucesos o actividades especficas. En este trastorno la ansiedad es difcil de Chief Operating Officercontrolar. Los sntomas pueden variar de leves a muy graves. Las personas que sufren de trastorno de ansiedad generalizada pueden tener intensas olas de ansiedad con sntomas fsicos (ataques de pnico).  SNTOMAS  La ansiedad y la preocupacin asociada a este trastorno son difciles de Chief Operating Officercontrolar. Esta ansiedad y la preocupacin estn relacionados con muchos eventos de la vida y sus actividades y tambin ocurre durante ms [employer] de los que no ocurre, durante 6 meses o ms. Las personas que la sufren pueden tener tres o ms de los siguientes sntomas (uno o ms en los nios):   Glass blower/designerAgitacin   Fatiga.  Dificultades de concentracin.   Irritabilidad.  Tensin muscular  Dificultad para dormirse o sueo poco satisfactorio. DIAGNSTICO  Se diagnostica a travs de una evaluacin realizada por el mdico. El mdico le har preguntas acerca de su estado de nimo, sntomas fsicos y sucesos de Oregonsu vida. Le har preguntas sobre su historia clnica, el consumo de alcohol o drogas, incluyendo  los medicamentos recetados. Nucor Corporationambin le har un examen fsico e indicar anlisis de Manelesangre. Ciertas enfermedades y el uso de determinadas sustancias pueden causar sntomas similares a este trastorno. Su mdico lo puede derivar a Music therapistun especialista en salud mental para una evaluacin ms profunda.Gerlean Ren.  TRATAMIENTO  Las terapias siguientes se utilizan en el tratamiento de este trastorno:   Medicamentos - Se recetan antidepresivos para el control diario a Air cabin crewlargo plazo. Pueden indicarse tambin medicamentos para combatir la Cox Communicationsansiedad en los casos graves, especialmente cuando ocurren ataques de pnico.   Terapia conversada (psicoterapia) Ciertos tipos de psicoterapia pueden ser tiles en el tratamiento del trastorno de ansiedad generalizada, proporcionando apoyo, educacin y Optometristorientacin. Una forma de psicoterapia llamada terapia cognitivo-conductual puede ensearle formas saludables de pensar y Publishing rights managerreaccionar a los eventos y actividades de la vida diaria.  Tcnicasde manejo del estrs- Estas tcnicas incluyen el yoga, la meditacin y el ejercicio y pueden ser muy tiles cuando se practican con regularidad. Un especialista en salud mental puede ayudar a determinar qu tratamiento es mejor para usted. Algunas personas obtienen mejora con una terapia. Sin embargo, Economistotras personas requieren una combinacin de terapias.  Esta informacin no tiene Theme park managercomo fin reemplazar el consejo del mdico. Asegrese de hacerle al mdico cualquier pregunta que tenga. Document Released: 07/02/2012 Document Revised: 03/28/2014 Elsevier Interactive Patient Education  2017 [employer]

## 2016-12-01 NOTE — Progress Notes (Signed)
Patient ID: Tracie [name], female   DOB: [birth year], 36 y.o.   MRN: [id number]   Tracie [name], is a 36 y.o. female  [id number]  [id number]  DOB - [birth year]  Subjective:  Chief Complaint and HPI: Tracie [name] is a 36 y.o. female here today for f/up for abdominal pain and after starting Zoloft. She stopped the Celexa and started Zoloft as discussed.  Some mild dizziness and headaches that are starting to improve. No N/V.    No change in anxiety so far.  Previously saw Dr Dub MikesLugo but unable to see him anymore due to lack of coverage.  Denies SI/HI.    CT scan of abdomen was negative.  Abdominal pain has improved overall since last week and she had no pain for the last 2 days.    [employer] interpreters translating  ROS:   Constitutional:  No f/c, No night sweats, No unexplained weight loss. EENT:  No vision changes, No blurry vision, No hearing changes. No mouth, throat, or ear problems.  Respiratory: No cough, No SOB Cardiac: No CP, no palpitations GI:  No abd pain, No N/V/D. GU: No Urinary s/sx Musculoskeletal: No joint pain Neuro: No headache, no dizziness, no motor weakness.  Skin: No rash Endocrine:  No polydipsia. No polyuria.  Psych: Denies SI/HI  No problems updated.  ALLERGIES: No Known Allergies  PAST MEDICAL HISTORY: Past Medical History:  Diagnosis Date  . Headache     MEDICATIONS AT HOME: Prior to Admission medications   Medication Sig Start Date End Date Taking? Authorizing Provider  cetirizine (ZYRTEC) 10 MG tablet Take 1 tablet (10 mg total) by mouth daily. Patient not taking: Reported on 10/12/2016 07/13/16   Jaclyn ShaggyAmao, Enobong, MD  ergocalciferol (DRISDOL) 50000 units capsule Take 1 capsule (50,000 Units total) by mouth once a week. 09/13/16   Jaclyn ShaggyAmao, Enobong, MD  hydrocortisone-pramoxine (ANALPRAM HC) 2.5-1 % rectal cream Place 1 application rectally 3 (three) times daily. Patient not taking: Reported on 03/22/2016 01/18/16   Jaclyn ShaggyAmao,  Enobong, MD  hydrOXYzine (ATARAX/VISTARIL) 10 MG tablet Take 1 tablet (10 mg total) by mouth 3 (three) times daily as needed. 07/13/16   Jaclyn ShaggyAmao, Enobong, MD  naproxen (NAPROSYN) 500 MG tablet Take 1 tablet (500 mg total) by mouth 2 (two) times daily with a meal. 11/30/16   McClung, Marzella SchleinAngela M, PA-C  olopatadine (PATANOL) 0.1 % ophthalmic solution Place 1 drop into both eyes 2 (two) times daily. Patient not taking: Reported on 10/12/2016 07/13/16   Jaclyn ShaggyAmao, Enobong, MD  pantoprazole (PROTONIX) 40 MG tablet Take 1 tablet (40 mg total) by mouth daily. Patient not taking: Reported on 11/24/2016 10/12/16   Anders SimmondsMcClung, Angela M, PA-C  Phenylephrine-Acetaminophen (VICKS DAYQUIL SINEX) 5-325 MG CAPS Take by mouth.    [provider]  sertraline (ZOLOFT) 50 MG tablet Take 1 tablet (50 mg total) by mouth daily. X 1 week then take 2 tablets  daily 11/24/16   Anders SimmondsMcClung, Angela M, PA-C     Objective:  EXAM:   Vitals:   12/01/16 1443  BP: 111/75  Pulse: 81  Resp: 18  Temp: 99.2 F (37.3 C)  TempSrc: Oral  SpO2: 99%  Weight: 176 lb 9.6 oz (80.1 kg)  Height: 5\' 4"  (1.626 m)    General appearance : A&OX3. NAD. Non-toxic-appearing HEENT: Atraumatic and Normocephalic.  PERRLA. EOM intact.   Neck: supple, no JVD. No cervical lymphadenopathy. No thyromegaly Chest/Lungs:  Breathing-non-labored, Good air entry bilaterally, breath sounds normal without rales, rhonchi, or wheezing  CVS: S1 S2  regular, no murmurs, gallops, rubs  Extremities: Bilateral Lower Ext shows no edema, both legs are warm to touch with = pulse throughout Neurology:  CN II-XII grossly intact, Non focal.   Psych:  TP linear. J/I WNL. Normal speech. Appropriate eye contact and affect.  Skin:  No Rash  Data Review Lab Results  Component Value Date   HGBA1C 5.6 10/23/2015   HGBA1C 5.5 09/09/2013     Assessment & Plan   1. Anxiety She is receptive to counseling now.  Continue Zoloft at  daily for another week and if dizziness and  headaches have resolved at the end of another week, she can increase the dose to 2 tabs(100mg )daily.  Self-care, Practice deep breathing, increase water intake, eat regular healthy meals and try to include ~25 g protein in each meal.   - Ambulatory referral to Psychology  2. Right upper quadrant abdominal pain No pain for the last 2 days and pain seems to have lessened   Patient have been counseled extensively about nutrition and exercise  Return in about 1 month (around 12/31/2016) for with Dr Venetia Night, f/up anxiety and abd pain.  The patient was given clear instructions to go to ER or return to medical center if symptoms don't improve, worsen or new problems develop. The patient verbalized understanding. The patient was told to call to get lab results if they haven't heard anything in the next week.     Georgian Co, PA-C [facility] and [facility] [place], [place] [phone]   12/01/2016, 3:01 PM

## 2016-12-15 ENCOUNTER — Ambulatory Visit (INDEPENDENT_AMBULATORY_CARE_PROVIDER_SITE_OTHER): Payer: No Typology Code available for payment source | Admitting: Psychology

## 2016-12-15 DIAGNOSIS — F411 Generalized anxiety disorder: Secondary | ICD-10-CM

## 2016-12-19 MED FILL — SERTRALINE HCL 50 MG TABLET: 50 | 19 days supply | Qty: 30 | Fill #1

## 2016-12-20 MED FILL — NAPROXEN 500 MG TABLET: 500 | 30 days supply | Qty: 60 | Fill #0

## 2017-01-03 MED FILL — SERTRALINE HCL 50 MG TABLET: 50 | 19 days supply | Qty: 30 | Fill #2

## 2017-01-05 ENCOUNTER — Ambulatory Visit: Payer: No Typology Code available for payment source | Admitting: Psychology

## 2017-01-18 ENCOUNTER — Ambulatory Visit: Payer: Self-pay | Attending: Internal Medicine

## 2017-01-19 ENCOUNTER — Ambulatory Visit: Payer: Self-pay | Admitting: Psychology

## 2017-01-31 ENCOUNTER — Telehealth: Payer: Self-pay | Admitting: Family Medicine

## 2017-01-31 DIAGNOSIS — F419 Anxiety disorder, unspecified: Secondary | ICD-10-CM

## 2017-01-31 NOTE — Telephone Encounter (Signed)
Pt called to request a refill for sertraline (ZOLOFT) 50 MG tablet  Please follow up

## 2017-02-01 ENCOUNTER — Other Ambulatory Visit: Payer: Self-pay | Admitting: Physician Assistant

## 2017-02-01 DIAGNOSIS — F419 Anxiety disorder, unspecified: Secondary | ICD-10-CM

## 2017-02-01 MED ORDER — SERTRALINE HCL 100 MG PO TABS: 100.0000 mg | ORAL_TABLET | Freq: Every day | ORAL | 1 refills | Status: DC

## 2017-02-01 MED FILL — SERTRALINE HCL 100 MG TAB: 100 | 30 days supply | Qty: 30 | Fill #0

## 2017-02-01 NOTE — Telephone Encounter (Signed)
Refilled

## 2017-02-01 NOTE — Telephone Encounter (Signed)
Pt. Came to facility requesting a refill on Zoloft. Pt. Had an appt. For 02/22/17 abd only has 3 days worth of medication. Please f/u

## 2017-02-02 ENCOUNTER — Ambulatory Visit (INDEPENDENT_AMBULATORY_CARE_PROVIDER_SITE_OTHER): Payer: No Typology Code available for payment source | Admitting: Psychology

## 2017-02-02 DIAGNOSIS — F321 Major depressive disorder, single episode, moderate: Secondary | ICD-10-CM

## 2017-02-16 ENCOUNTER — Ambulatory Visit: Payer: No Typology Code available for payment source | Admitting: Psychology

## 2017-02-22 ENCOUNTER — Encounter: Payer: Self-pay | Admitting: Family Medicine

## 2017-02-22 ENCOUNTER — Ambulatory Visit: Payer: Self-pay | Attending: Family Medicine | Admitting: Family Medicine

## 2017-02-22 VITALS — BP 124/80 | HR 88 | Temp 98.4°F | Ht 64.0 in | Wt 178.4 lb

## 2017-02-22 DIAGNOSIS — F329 Major depressive disorder, single episode, unspecified: Secondary | ICD-10-CM | POA: Insufficient documentation

## 2017-02-22 DIAGNOSIS — F32A Depression, unspecified: Secondary | ICD-10-CM

## 2017-02-22 DIAGNOSIS — N760 Acute vaginitis: Secondary | ICD-10-CM | POA: Insufficient documentation

## 2017-02-22 DIAGNOSIS — E559 Vitamin D deficiency, unspecified: Secondary | ICD-10-CM | POA: Insufficient documentation

## 2017-02-22 DIAGNOSIS — F419 Anxiety disorder, unspecified: Secondary | ICD-10-CM | POA: Insufficient documentation

## 2017-02-22 MED ORDER — METRONIDAZOLE 0.75 % VA GEL: 1.0000 | Freq: Every day | VAGINAL | 0 refills | Status: DC

## 2017-02-22 MED ORDER — SERTRALINE HCL 100 MG PO TABS: 150.0000 mg | ORAL_TABLET | Freq: Every day | ORAL | 6 refills | Status: DC

## 2017-02-22 MED ORDER — HYDROXYZINE HCL 10 MG PO TABS: 10.0000 mg | ORAL_TABLET | Freq: Three times a day (TID) | ORAL | 6 refills | Status: DC | PRN

## 2017-02-22 MED FILL — VANDAZOLE VAGINAL 0.75% GEL: 0.75 | 5 days supply | Qty: 70 | Fill #0

## 2017-02-22 MED FILL — hydrOXYzine HCL 10 MG TABS: 10 | 30 days supply | Qty: 90 | Fill #0

## 2017-02-22 NOTE — Patient Instructions (Signed)
Vaginosis bacteriana (Bacterial Vaginosis) La vaginosis bacteriana es una infeccin de la vagina. Se produce cuando crece una cantidad excesiva de grmenes normales (bacterias sanas) en la vagina. Esta infeccin aumenta el riesgo de contraer otras infecciones de transmisin sexual. El tratamiento de esta infeccin puede ayudar a reducir el riesgo de otras infecciones, como:  Clamidia.  Gonorrea.  VIH.  Herpes. CUIDADOS EN EL HOGAR  Tome los medicamentos tal como se lo indic su mdico.  Finalice la prescripcin completa, aunque comience a sentirse mejor.  Comunique a sus compaeros sexuales que sufre una infeccin. Deben consultar a su mdico para iniciar un tratamiento.  Durante el tratamiento: ? Evite mantener relaciones sexuales o use preservativos de la forma correcta. ? No se haga duchas vaginales. ? No consuma alcohol a menos que el mdico lo autorice. ? No amamante a menos que el mdico la autorice.  SOLICITE AYUDA SI:  No mejora luego de 3 das de tratamiento.  Observa una secrecin (prdida) de color gris ms abundante que proviene de la vagina.  Siente ms dolor que antes.  Tiene fiebre.  ASEGRESE DE QUE:  Comprende estas instrucciones.  Controlar su afeccin.  Recibir ayuda de inmediato si no mejora o si empeora.  Esta informacin no tiene como fin reemplazar el consejo del mdico. Asegrese de hacerle al mdico cualquier pregunta que tenga. Document Released: 06/03/2008 Document Revised: 06/29/2015 Document Reviewed: 10/17/2012 Elsevier Interactive Patient Education  2017 Elsevier Inc.  

## 2017-02-22 NOTE — Progress Notes (Signed)
Pt complains of vaginal odor and discharge.

## 2017-02-22 NOTE — Progress Notes (Signed)
Subjective:  Patient ID: Tracie [name], female    DOB: [birth year]  Age: 36 y.o. MRN: [id number]  CC: Depression   HPI Tracie Dickenslma [name] is a 36 year old female with a history of anxiety and depression who presents today with complaints of a 2-week history of vaginal discharge which is malodorous. Denies itching or urinary symptoms but has had some lower abdominal pain. She has one sexual partner and her last menstrual period was on 02/03/17.  She currently sees a therapist for her depression recommendation was to increase her dose of Zoloft to 150 mg.  Past Medical History:  Diagnosis Date  . Headache     No past surgical history on file.  No Known Allergies    Outpatient Medications Prior to Visit  Medication Sig Dispense Refill  . naproxen (NAPROSYN) 500 MG tablet Take 1 tablet (500 mg total) by mouth 2 (two) times daily with a meal. 60 tablet 1  . hydrOXYzine (ATARAX/VISTARIL) 10 MG tablet Take 1 tablet (10 mg total) by mouth 3 (three) times daily as needed. 90 tablet 3  . sertraline (ZOLOFT) 100 MG tablet Take 1 tablet (100 mg total) daily by mouth. 30 tablet 1  . cetirizine (ZYRTEC) 10 MG tablet Take 1 tablet (10 mg total) by mouth daily. (Patient not taking: Reported on 10/12/2016) 30 tablet 3  . ergocalciferol (DRISDOL) 50000 units capsule Take 1 capsule (50,000 Units total) by mouth once a week. (Patient not taking: Reported on 02/22/2017) 9 capsule 1  . hydrocortisone-pramoxine (ANALPRAM HC) 2.5-1 % rectal cream Place 1 application rectally 3 (three) times daily. (Patient not taking: Reported on 03/22/2016) 30 g 1  . olopatadine (PATANOL) 0.1 % ophthalmic solution Place 1 drop into both eyes 2 (two) times daily. (Patient not taking: Reported on 10/12/2016) 5 mL 2  . Phenylephrine-Acetaminophen (VICKS DAYQUIL SINEX) 5-325 MG CAPS Take by mouth.    . pantoprazole (PROTONIX) 40 MG tablet Take 1 tablet (40 mg total) by mouth daily. (Patient not taking: Reported on  11/24/2016) 30 tablet 3   No facility-administered medications prior to visit.     ROS Review of Systems  Constitutional: Negative for activity change, appetite change and fatigue.  HENT: Negative for congestion, sinus pressure and sore throat.   Eyes: Negative for visual disturbance.  Respiratory: Negative for cough, chest tightness, shortness of breath and wheezing.   Cardiovascular: Negative for chest pain and palpitations.  Gastrointestinal: Positive for abdominal pain. Negative for abdominal distention and constipation.  Endocrine: Negative for polydipsia.  Genitourinary: Positive for vaginal discharge. Negative for dysuria and frequency.  Musculoskeletal: Negative for arthralgias and back pain.  Skin: Negative for rash.  Neurological: Negative for tremors, light-headedness and numbness.  Hematological: Does not bruise/bleed easily.  Psychiatric/Behavioral: Negative for agitation and behavioral problems.    Objective:  BP 124/80   Pulse 88   Temp 98.4 F (36.9 C) (Oral)   Ht 5\' 4"  (1.626 m)   Wt 178 lb 6.4 oz (80.9 kg)   LMP 02/03/2017   SpO2 100%   BMI 30.62 kg/m   BP/Weight 02/22/2017 12/01/2016 11/24/2016  Systolic BP 124 111 110  Diastolic BP 80 75 75  Wt. (Lbs) 178.4 176.6 177  BMI 30.62 30.31 29.91      Physical Exam  Constitutional: She is oriented to person, place, and time. She appears well-developed and well-nourished.  Cardiovascular: Normal rate, normal heart sounds and intact distal pulses.  No murmur heard. Pulmonary/Chest: Effort normal and breath sounds normal. She has no wheezes.  She has no rales. She exhibits no tenderness.  Abdominal: Soft. Bowel sounds are normal. She exhibits no distension and no mass. There is no tenderness.  Genitourinary:  Genitourinary Comments: Normal external genitalia Vaginal discharge with fishy odor  Musculoskeletal: Normal range of motion.  Neurological: She is alert and oriented to person, place, and time.      Assessment & Plan:   1. Anxiety and depression Increased dose of Zoloft to 150 mg Continue therapy - hydrOXYzine (ATARAX/VISTARIL) 10 MG tablet; Take 1 tablet (10 mg total) by mouth 3 (three) times daily as needed.  Dispense: 90 tablet; Refill: 6 - sertraline (ZOLOFT) 100 MG tablet; Take 1.5 tablets (150 mg total) by mouth daily.  Dispense: 45 tablet; Refill: 6  2. Vaginitis and vulvovaginitis Treated with MetroGel - Cervicovaginal ancillary only  3. Vitamin D deficiency Previous history of vitamin D deficiency and she has completed a course of Drisdol We will check vitamin D again - VITAMIN D 25 Hydroxy (Vit-D Deficiency, Fractures)   Meds ordered this encounter  Medications  . hydrOXYzine (ATARAX/VISTARIL) 10 MG tablet    Sig: Take 1 tablet (10 mg total) by mouth 3 (three) times daily as needed.    Dispense:  90 tablet    Refill:  6  . sertraline (ZOLOFT) 100 MG tablet    Sig: Take 1.5 tablets (150 mg total) by mouth daily.    Dispense:  45 tablet    Refill:  6  . metroNIDAZOLE (METROGEL VAGINAL) 0.75 % vaginal gel    Sig: Place 1 Applicatorful vaginally at bedtime.    Dispense:  70 g    Refill:  0    Follow-up: Return in about 3 months (around 05/23/2017) for follow Up of anxiety and depression.   Jaclyn ShaggyEnobong Amao MD

## 2017-02-23 ENCOUNTER — Other Ambulatory Visit: Payer: Self-pay | Admitting: Family Medicine

## 2017-02-23 ENCOUNTER — Telehealth: Payer: Self-pay

## 2017-02-23 LAB — VITAMIN D 25 HYDROXY (VIT D DEFICIENCY, FRACTURES): Vit D, 25-Hydroxy: 20.4 ng/mL — ABNORMAL LOW (ref 30.0–100.0)

## 2017-02-23 MED ORDER — ERGOCALCIFEROL 1.25 MG (50000 UT) PO CAPS: 50000.0000 [IU] | ORAL_CAPSULE | ORAL | 1 refills | Status: DC

## 2017-02-23 MED FILL — VIT D2 1.25 MG (50,000 UNIT: 1.25 MG | 28 days supply | Qty: 4 | Fill #0

## 2017-02-23 NOTE — Telephone Encounter (Signed)
Pt was called via interpreter and informed of lab results. 

## 2017-02-24 LAB — CERVICOVAGINAL ANCILLARY ONLY
Bacterial vaginitis: NEGATIVE
CANDIDA VAGINITIS: NEGATIVE
CHLAMYDIA, DNA PROBE: NEGATIVE
Neisseria Gonorrhea: NEGATIVE
Trichomonas: NEGATIVE

## 2017-03-02 ENCOUNTER — Ambulatory Visit: Payer: Self-pay | Admitting: Psychology

## 2017-03-02 ENCOUNTER — Telehealth: Payer: Self-pay

## 2017-03-02 NOTE — Telephone Encounter (Signed)
Pt was called and informed of normal lab results via interpreter [phone]#[id number].

## 2017-03-07 MED FILL — SERTRALINE HCL 100 MG TAB: 100 | 30 days supply | Qty: 30 | Fill #1

## 2017-03-22 ENCOUNTER — Other Ambulatory Visit: Payer: Self-pay | Admitting: Family Medicine

## 2017-03-22 ENCOUNTER — Ambulatory Visit: Payer: Self-pay | Attending: Family Medicine | Admitting: Physician Assistant

## 2017-03-22 ENCOUNTER — Other Ambulatory Visit: Payer: Self-pay

## 2017-03-22 VITALS — BP 115/78 | HR 108 | Temp 99.4°F | Resp 16 | Wt 182.6 lb

## 2017-03-22 DIAGNOSIS — F419 Anxiety disorder, unspecified: Secondary | ICD-10-CM

## 2017-03-22 DIAGNOSIS — Z79899 Other long term (current) drug therapy: Secondary | ICD-10-CM | POA: Insufficient documentation

## 2017-03-22 DIAGNOSIS — N3000 Acute cystitis without hematuria: Secondary | ICD-10-CM

## 2017-03-22 DIAGNOSIS — F329 Major depressive disorder, single episode, unspecified: Secondary | ICD-10-CM

## 2017-03-22 LAB — POCT URINALYSIS DIPSTICK
BILIRUBIN UA: NEGATIVE
Glucose, UA: NEGATIVE
KETONES UA: NEGATIVE
NITRITE UA: NEGATIVE
PH UA: 6 (ref 5.0–8.0)
PROTEIN UA: NEGATIVE
Spec Grav, UA: 1.015 (ref 1.010–1.025)
UROBILINOGEN UA: 0.2 U/dL

## 2017-03-22 MED ORDER — FLUCONAZOLE 150 MG PO TABS: 150.0000 mg | ORAL_TABLET | Freq: Once | ORAL | 0 refills | Status: AC

## 2017-03-22 MED ORDER — NITROFURANTOIN MONOHYD MACRO 100 MG PO CAPS: 100.0000 mg | ORAL_CAPSULE | Freq: Two times a day (BID) | ORAL | 0 refills | Status: DC

## 2017-03-22 MED FILL — VIT D2 1.25 MG (50,000 UNIT: 1.25 MG | 56 days supply | Qty: 8 | Fill #1

## 2017-03-22 MED FILL — ?NITROFURANTOIN-MACRO 100 M: 100 | 5 days supply | Qty: 10 | Fill #0

## 2017-03-22 MED FILL — FLUCONAZOLE 150 MG TABLET: 150 | 1 days supply | Qty: 1 | Fill #0

## 2017-03-22 NOTE — Patient Instructions (Addendum)
Vivir con ansiedad Living With Anxiety Despus de haber sido diagnosticado con trastorno de ansiedad, podra sentirse aliviado por comprender por qu se haba sentido o haba actuado de cierto modo. Adems, es natural sentirse abrumado por el tratamiento que tiene por delante y por lo que este significar para su vida. Con atencin y [place], [place] trastorno y Marine scientist. Cmo hacer frente a la ansiedad Enfrentar el estrs El estrs es la reaccin del cuerpo ante los cambios y los acontecimientos de la vida, tanto bu[place]. El estrs puede durar solo algunas horas o puede ser [place]. El estrs puede influir mucho en la ansiedad, por lo que es importante aprender sobre cmo hacerle frente y cmo pensarlo de un modo nuevo. Hable con el mdico o un orientador psicolgico para obtener ms informacin sobre cmo Software engineer. Podran sugerirle algunas tcnicas para hacerlo, como:  Musicoterapia. Esto podra incluir crear o escuchar msica que disfrute y lo inspire.  Meditacin consciente. Esto implica prestar atencin a la respiracin normal ms que intentar controlarla. Puede realizarse mientras est sentado o camina.  Oracin centrante. Este es un tipo de meditacin que implica centrarse en una palabra, frase o imagen sagrada que le sea representativa y le genere paz.  Respiracin profunda. Para hacer esto, expanda el estmago e inhale lentamente por la nariz. Mantenga el aire durante un lapso de [place]. Luego, exhale lentamente mientras deja que los msculos del estmago se relajen.  Dilogo interno. Se trata de una habilidad por la que usted es capaz de identificar patrones de pensamiento que lo llevan a Best boy reacciones ansiosas y de corregir dichos pensamientos.  Relajacin muscular. Esto implica tensar los msculos y, [place], [place].  Elija una tcnica para reducir el estrs que se adapte a su estilo de vida y su personalidad. Las tcnicas para  reducir el estrs llevan tiempo y Location manager. Resrvese de [id number] por da para hAmbulance person Algunos terapeutas pueden ofrecerle capacitacin para aprenderlas. Es posible que algunos planes de seguro mdico cubran la capacitacin. Otras cosas que puede hacer para manejar el estrs:  LCatering managerun registro del estrs. Esto puede ayudarlo a iFinancial plannerestrs y modos de cChief Technology Officersu reaccin.  Pensar en cmo reacciona ante ciertas situaciones. Es posible que no sea capaz de cChief Technology Officertodo, pero puede controlar su reaccin.  Hacerse tiempo para las actividades que lo ayudan a rNurse, children'sy no sentir culpa por pasar su tiempo de e[place]  La terapia en combinacin con las habilidades para enfrentar y reducir el estrs proporciona la mejor alternativa para un tratamiento satisfactorio. Medicamentos Los medicamentos pueden ayudar a aE. I. [employer] Pont Algunos medicamentos para la ansiedad:  MTeacher, adult educationansiedad.  Antidepresivos.  Betabloqueantes.  Es posible que se requieran medicamentos, junto con la terapia, si otros tratamientos no dieron rLarke Un mdico debe recetar los medicamentos. LGratzinterpersonales pueden ser muy importantes para ayudar a su recuperacin. Intente pasar ms tiempo interactuando con amigos y familiares de c[place]oz[place] Considere la posibilidad de ir a terapia de pareja, tomar clases de educacin familiar o ir a tCareers information officer La terapia puede ayudarlos a usted y a los dems a comprender mejor el trastorno. Cmo reconocer cambios en el trastorno Todos tienen una respuesta diferente al tratamiento de la ansiedad. Se dice que est recuperado de la ansiedad cuando los sntomas disminuyen y dejan de iCabin crewen las actividades diarias en el hogar o eLa [place] Esto podra significar que usted comenzar a hField seismologistlo  siguiente:  Tener mejor concentracin y atencin.  Dormir mejor.  Estar menos irritable.  Tener  ms energa.  Tener [employer].  Es Public librarian cundo el trastorno [place]. Comunquese con el mdico si sus sntomas interfieren en su hogar o su trabajo, y usted no siente que el trastorno est mejorando. Dnde encontrar ayuda y apoyo: Puede conseguir ayuda y [employer] siguientes lugares:  Grupos de [place].  Organizaciones comunitarias y en lnea.  Un lder espiritual de confianza.  Terapia de pareja.  Clases de educacin familiar.  Terapia familiar.  Siga estas instrucciones en su casa:  Consuma una dieta saludable que incluya abundantes frutas, verduras, cereales integrales, productos lcteos descremados y protenas magras. No consuma muchos alimentos con alto contenido de grasas slidas, azcares agregados o sal.  Actividad fsica. La Harley[employer]Davidson de los adultos debe hacer lo siguiente: ? Education officer, environmental, al [place], de actividad fsica por semana. El ejercicio debe aumentar la frecuencia cardaca y Media planner transpirar (ejercicio de intensidad moderada). ? Realizar ejercicios de fortalecimiento por lo [employer] por semana.  Disminuir el consumo de cafena, tabaco, alcohol y otras sustancias potencialmente dainas.  Dormir el tiempo adecuado y de Network engineer. La Harley[employer]Davidson de los adultos necesitan entre [id number] de sueo todas las noches.  Opte por cosas que le simplifiquen la vida.  Tome los medicamentos de venta libre y los recetados solamente como se lo haya indicado el mdico.  Evite el consumo de cafena, alcohol y ciertos medicamentos contra el resfro de venta sin receta. Estos podran Optician, dispensing. Pregntele al farmacutico qu medicamentos no debera tomar.  Concurra a todas las visitas de control como se lo haya indicado el mdico. Esto es importante. Preguntas para hacerle al mdico  Me ser til la terapia?  Con qu frecuencia debo visitar a un mdico para el seguimiento?  Durante cunto tiempo tendr [employer]?  Tienen efectos secundarios a [employer] tomo?  Existe una alternativa que remplace los medicamentos? Comunquese con un mdico si:  Le resulta difcil permanecer concentrado o finalizar las tareas diarias.  Pasa muchas horas por da sintindose preocupado por la vida cotidiana.  La preocupacin le provoca un cansancio extremo.  Comienza a tener dolores de [place] o nuseas, o a sentirse tenso.  Orina ms de lo normal.  Tiene diarrea. Solicite ayuda de inmediato si:  Se le acelera la frecuencia cardaca y Games developer.  Tiene pensamientos acerca de Runner, broadcasting/film/video o daar a Economist. Si alguna vez siente que puede lastimarse o Physicist, medical a los dems, o tiene pensamientos de poner fin a su vida, busque ayuda de inmediato. Puede dirigirse al servicio de urgencias ms cercano o comunicarse con:  El servicio de Sports administrator de su localidad (911 en los [place]).  Una lnea de asistencia al suicida y Visual merchandiser en crisis, como la [employer] de Prevencin del Suicidio (National Suicide Prevention Lifeline) al [phone]. Est disponible las 24 horas del da.  Resumen  Tomar medidas para enfrentar el estrs puede calmarlo.  Los medicamentos no pueden curar los trastornos de [place], Biomedical engineer pueden ayudar a [employer].  Los familiares, los amigos y las parejas pueden tener un lugar importante en su recuperacin del trastorno de ansiedad. Esta informacin no tiene Theme park manager el consejo del mdico. Asegrese de hacerle al mdico cualquier pregunta que tenga. Document Released: 06/14/2016 Document Revised: 06/14/2016 Document Reviewed: 06/14/2016 Elsevier Interactive Patient Education  2018 [employer].  Infeccin de  las vas urinarias, en adultos Urinary Tract Infection, Adult Una infeccin de las vas urinarias (IVU) es una infeccin en cualquier parte de las vas urinarias, que Baxter Internationalincluyen los riones, los  urteres, la vejiga y Engineer, miningla uretra. Estos rganos fabrican, Barrister's clerkalmacenan y eliminan la orina del organismo. La IVU puede ser una infeccin de la vejiga (cistitis) o una infeccin renal (pielonefritis). Cules son las causas? Esta infeccin puede deberse a hongos, virus o bacterias. Las bacterias son la causa ms comunes de las IVU. Esta afeccin tambin puede ser provocada por no vaciar la vejiga por completo durante la miccin en repetidas ocasiones. Qu incrementa el riesgo? Es ms probable que esta afeccin se manifieste si:  Usted ignora la necesidad de Geographical information systems officerorinar o retiene la orina durante mucho tiempo.  No vaca la vejiga completamente durante la miccin.  Es Janne Napoleonuna mujer y se limpia de atrs hacia adelante despus de Geographical information systems officerorinar o Advertising copywriterdefecar.  Es un hombre y est circuncidado.  Tiene estreimiento.  Tiene colocado un catter urinario (sonda urinaria) [place]viewpermanente.  Tiene debilitado el sistema de defensa (inmunitario) del cuerpo.  Tiene una enfermedad que Colgate Palmoliveafecta los intestinos, los riones o la vejiga.  Tiene diabetes.  Toma antibiticos con frecuencia o durante largos perodos, y los antibiticos ya no resultan eficaces para combatir algunos tipos de infecciones (resistencia a los antibiticos).  Toma medicamentos que Lubrizol Corporationle irritan las vas Carrolltonurinarias.  Est expuesto a sustancias qumicas que le irritan las vas urinarias.  Es mujer.  Cules son los signos o los sntomas? Los sntomas de esta afeccin incluyen lo siguiente:  Grant RutsFiebre.  Miccin frecuente o eliminacin de pequeas cantidades de orina con frecuencia.  Necesidad urgente de Geographical information systems officerorinar.  Ardor o dolor al ConocoPhillipsorinar.  Orina con mal olor u olor atpico.  Mason Jimrina turbia.  Dolor en la parte baja del abdomen o en la espalda.  Dificultad para orinar.  Presencia de Federated Department Storessangre en la orina.  Tener vmitos o menos apetito de lo normal.  Diarrea o dolor abdominal.  Secrecin vaginal, si es mujer.  Cmo se diagnostica? Esta afeccin se  diagnostica con base en la historia clnica y un examen fsico. Tambin deber proporcionar Lauris Poaguna muestra de orina para realizar anlisis. Podrn indicarle otros estudios, por ejemplo:  Anlisis de [place]re.  Anlisis de enfermedades de transmisin sexual (ETS).  Si ha tenido ms de una IVU, se pueden hacer estudios de diagnstico por imgenes o una cistoscopia para determinar la causa de las infecciones. Cmo se trata? El tratamiento de esta afeccin suele incluir una combinacin de dos o ms de los siguientes:  Antibiticos.  Otros medicamentos para tratar causas menos frecuentes de IVU.  Medicamentos de venta libre para Engineer, materialsaliviar el dolor.  Cantidad suficiente agua para mantenerse hidratado.  Siga estas indicaciones en su casa:  Tome los medicamentos de venta libre y los recetados solamente como se lo haya indicado el mdico.  Si le recetaron un antibitico, tmelo como se lo haya indicado el mdico. No deje de tomar el antibitico aunque comience a sentirse mejor.  Evite el alcohol, la cafena, el t y las [address] Hospital Placebebidas gaseosas. Estas bebidas pueden irritar la vejiga.  Beba suficiente lquido para Photographermantener la orina clara o de color amarillo plido.  Concurra a todas las visitas de [address] Eld[address] Parkwayseguimiento como se lo haya indicado el mdico. Esto es importante.  Asegrese de lo siguiente: ? Vaciar la vejiga con frecuencia y en su totalidad. No retener la orina durante largos perodos. ? Vaciar la vejiga antes y despus de Management consultanttener relaciones sexuales. ?  Limpiarse de adelante hacia atrs despus de defecar, si es mujer. Usar cada trozo de papel higinico solo una vez cuando se limpie. Comunquese con un mdico si:  Siente dolor en la espalda.  Tiene fiebre.  Siente nuseas o vomita.  Los sntomas no mejoran despus de 3das de [place].  Los sntomas desaparecen y luego vuelven a Research officer, trade union. Solicite ayuda de inmediato si:  Siente dolor intenso en la espalda o en la zona inferior del  abdomen.  Tiene vmitos y no puede tragar los medicamentos ni tomar agua. Esta informacin no tiene Theme park manager el consejo del mdico. Asegrese de hacerle al mdico cualquier pregunta que tenga. Document Released: 12/15/2004 Document Revised: 06/22/2016 Document Reviewed: 01/26/2015 Elsevier Interactive Patient Education  [employer].

## 2017-03-22 NOTE — [facility] Notes (Signed)
Patient ID: Tracie [name], female   DOB: Feb 23, 1981, 37 y.o.   MRN: [id number]     Hanya Guerin, is a 37 y.o. female  [id number]  [id number]  DOB - Aug 28, 1980  Subjective:  Chief Complaint and HPI: Tracie [name] is a 37 y.o. female here today Complains of some lower abdominal pain-this is not new-she has had a CT that was negative.  This problem is ongoing and long standing. Sensation of incomplete bladder voiding and urinating on herself with sneezing.  Some frequency and mild burning.  No flank/abdominal pain.  Symptoms present for a couple of weeks and getting worse.  No f/c. No N/V.    Anxiety and depression do not feel any better than when meds were adjusted a few weeks ago.  She is seeing a Veterinary surgeon.  Holidays were stressful.  Not back into regular routine yet. Denies SI/HI.  No acute safety issues.    Hubbard Seldon/Priscilla translating with stratus interpreters.   ROS:   Constitutional:  No f/c, No night sweats, No unexplained weight loss. EENT:  No vision changes, No blurry vision, No hearing changes. No mouth, throat, or ear problems.  Respiratory: No cough, No SOB Cardiac: No CP, no palpitations GI:  No abd pain, No N/V/D. GU: No other Urinary s/sx Musculoskeletal: No joint pain Neuro: No headache, no dizziness, no motor weakness.  Skin: No rash Endocrine:  No polydipsia. No polyuria.  Psych: Denies SI/HI  No problems updated.  ALLERGIES: No Known Allergies  PAST MEDICAL HISTORY: Past Medical History:  Diagnosis Date  . Headache     MEDICATIONS AT HOME: Prior to Admission medications   Medication Sig Start Date End Date Taking? Authorizing Provider  ergocalciferol (DRISDOL) 50000 units capsule Take 1 capsule (50,000 Units total) by mouth once a week. 02/23/17  Yes Jaclyn Shaggy, MD  hydrOXYzine (ATARAX/VISTARIL) 10 MG tablet Take 1 tablet (10 mg total) by mouth 3 (three) times daily as needed. 02/22/17  Yes Jaclyn Shaggy, MD  naproxen  (NAPROSYN) 500 MG tablet Take 1 tablet (500 mg total) by mouth 2 (two) times daily with a meal. 11/30/16  Yes Zacary Bauer M, PA-C  fluconazole (DIFLUCAN) 150 MG tablet Take 1 tablet (150 mg total) by mouth once for 1 dose. 03/22/17 03/22/17  Anders Simmonds, PA-C  nitrofurantoin, macrocrystal-monohydrate, (MACROBID) 100 MG capsule Take 1 capsule (100 mg total) by mouth 2 (two) times daily. 03/22/17   Anders Simmonds, PA-C  Phenylephrine-Acetaminophen (VICKS DAYQUIL SINEX) 5-325 MG CAPS Take by mouth.    [provider]     Objective:  EXAM:   Vitals:   03/22/17 1414  BP: 115/78  Pulse: (!) 108  Resp: 16  Temp: 99.4 F (37.4 C)  TempSrc: Oral  SpO2: 96%  Weight: 182 lb 9.6 oz (82.8 kg)    General appearance : A&OX3. NAD. Non-toxic-appearing HEENT: Atraumatic and Normocephalic.  PERRLA. EOM intact.  Neck: supple, no JVD. No cervical lymphadenopathy. No thyromegaly Chest/Lungs:  Breathing-non-labored, Good air entry bilaterally, breath sounds normal without rales, rhonchi, or wheezing  CVS: S1 S2 regular, no murmurs, gallops, rubs  Abdomen: Bowel sounds present, Non tender and not distended with no gaurding, rigidity or rebound. No V+CVA TTP Extremities: Bilateral Lower Ext shows no edema, both legs are warm to touch with = pulse throughout Neurology:  CN II-XII grossly intact, Non focal.   Psych:  TP linear. J/I WNL. Normal speech. Appropriate eye contact and affect.  Skin:  No Rash  Data Review Lab Results  Component Value Date   HGBA1C 5.6 10/23/2015   HGBA1C 5.5 09/09/2013     Assessment & Plan   1. Acute cystitis without hematuria Increase water intake - Urinalysis Dipstick - nitrofurantoin, macrocrystal-monohydrate, (MACROBID) 100 MG capsule; Take 1 capsule (100 mg total) by mouth 2 (two) times daily.  Dispense: 10 capsule; Refill: 0 - fluconazole (DIFLUCAN) 150 MG tablet; Take 1 tablet (150 mg total) by mouth once for 1 dose.  Dispense: 1 tablet; Refill:  0-if needed - Urine Culture  2. Anxiety and depression Stable/no changes-continue meds.  She may not have noticed a difference with the increase dose in medications due to it also being the holidays and a stressful time of year.  I have stressed self-care, stress management techniques, and continuing counseling and medications and follow-up with PCP in a few weeks   Patient have been counseled extensively about nutrition and exercise  Return in about 3 weeks (around 04/12/2017) for Dr Alvis LemmingsNewlin for anxiety/depression.  The patient was given clear instructions to go to ER or return to medical center if symptoms don't improve, worsen or new problems develop. The patient verbalized understanding. The patient was told to call to get lab results if they haven't heard anything in the next week.     Georgian CoAngela Janitza Revuelta, PA-C [facility] [place], [place] [phone]   03/22/2017, 2:43 PM

## 2017-03-22 NOTE — Progress Notes (Signed)
Have pain in abdomen  "Feels like I leak urine after I pee" Unable to hold urine for long time Feels more anxious

## 2017-03-24 LAB — URINE CULTURE

## 2017-04-04 MED FILL — SERTRALINE HCL 100 MG TAB: 100 | 30 days supply | Qty: 45 | Fill #0

## 2017-04-10 ENCOUNTER — Ambulatory Visit: Payer: Self-pay | Admitting: Family Medicine

## 2017-05-01 MED FILL — SERTRALINE HCL 100 MG TAB: 100 | 30 days supply | Qty: 45 | Fill #1

## 2017-05-10 ENCOUNTER — Ambulatory Visit: Payer: Self-pay

## 2017-05-10 ENCOUNTER — Ambulatory Visit: Payer: Self-pay | Attending: Family Medicine

## 2017-05-19 ENCOUNTER — Ambulatory Visit: Payer: Self-pay

## 2017-05-19 MED FILL — VIT D2 1.25 MG (50,000 UNIT: 1.25 MG | 42 days supply | Qty: 6 | Fill #2

## 2017-06-05 MED FILL — SERTRALINE HCL 100 MG TAB: 100 | 30 days supply | Qty: 45 | Fill #2

## 2017-06-12 ENCOUNTER — Encounter: Payer: Self-pay | Admitting: Family Medicine

## 2017-06-12 ENCOUNTER — Ambulatory Visit: Payer: Self-pay | Attending: Family Medicine | Admitting: Family Medicine

## 2017-06-12 VITALS — BP 127/84 | HR 102 | Temp 98.3°F | Ht 64.0 in | Wt 181.6 lb

## 2017-06-12 DIAGNOSIS — F419 Anxiety disorder, unspecified: Secondary | ICD-10-CM

## 2017-06-12 DIAGNOSIS — Z79899 Other long term (current) drug therapy: Secondary | ICD-10-CM | POA: Insufficient documentation

## 2017-06-12 DIAGNOSIS — J018 Other acute sinusitis: Secondary | ICD-10-CM

## 2017-06-12 DIAGNOSIS — E559 Vitamin D deficiency, unspecified: Secondary | ICD-10-CM

## 2017-06-12 DIAGNOSIS — N939 Abnormal uterine and vaginal bleeding, unspecified: Secondary | ICD-10-CM

## 2017-06-12 DIAGNOSIS — F329 Major depressive disorder, single episode, unspecified: Secondary | ICD-10-CM

## 2017-06-12 DIAGNOSIS — Z13228 Encounter for screening for other metabolic disorders: Secondary | ICD-10-CM

## 2017-06-12 DIAGNOSIS — R002 Palpitations: Secondary | ICD-10-CM | POA: Insufficient documentation

## 2017-06-12 MED ORDER — SERTRALINE HCL 100 MG PO TABS: 100.0000 mg | ORAL_TABLET | Freq: Every day | ORAL | 3 refills | Status: DC

## 2017-06-12 MED ORDER — AMOXICILLIN 500 MG PO CAPS
500.0000 mg | ORAL_CAPSULE | Freq: Three times a day (TID) | ORAL | 0 refills | Status: DC
Start: 2017-06-12 — End: 2017-07-05

## 2017-06-12 MED ORDER — BUSPIRONE HCL 5 MG PO TABS: 5.0000 mg | ORAL_TABLET | Freq: Three times a day (TID) | ORAL | 3 refills | Status: DC

## 2017-06-12 MED FILL — AMOXICILLIN 500 MG CAPSULE: 500 | 10 days supply | Qty: 30 | Fill #0

## 2017-06-12 NOTE — Progress Notes (Signed)
Subjective:  Patient ID: Tracie [name], female    DOB: 1981-03-04  Age: 37 y.o. MRN: [id number]  CC: Anxiety   HPI Tracie [name] is a 37 year old female with a history of anxiety and depression who presents today for follow-up visit.  She states her anxiety symptoms are not controlled despite increase in dose of her Zoloft at her last office visit she continues to experience intermittent anxiety and at other times palpitations but denies suicidal ideation or intents. She complains of sinus pressure, dryness in her nostrils, intermittent chills and sweating for the last 2 weeks and start using Zyrtec and Flonase with no relief in symptoms. She suffers from abnormal uterine bleed and was recently placed on [facility]a [facility] but has not noticed any improvement in her periods as she sometimes bleeds continuously for one and a half months.  Pelvic ultrasound from 2015 was negative for uterine fibroids.  Past Medical History:  Diagnosis Date  . Headache     History reviewed. No pertinent surgical history.  No Known Allergies   Outpatient Medications Prior to Visit  Medication Sig Dispense Refill  . ergocalciferol (DRISDOL) 50000 units capsule Take 1 capsule (50,000 Units total) by mouth once a week. 9 capsule 1  . hydrOXYzine (ATARAX/VISTARIL) 10 MG tablet Take 1 tablet (10 mg total) by mouth 3 (three) times daily as needed. 90 tablet 6  . naproxen (NAPROSYN) 500 MG tablet Take 1 tablet (500 mg total) by mouth 2 (two) times daily with a meal. 60 tablet 1  . nitrofurantoin, macrocrystal-monohydrate, (MACROBID) 100 MG capsule Take 1 capsule (100 mg total) by mouth 2 (two) times daily. (Patient not taking: Reported on 06/12/2017) 10 capsule 0  . Phenylephrine-Acetaminophen (VICKS DAYQUIL SINEX) 5-325 MG CAPS Take by mouth.     No facility-administered medications prior to visit.     ROS Review of Systems  Constitutional: Negative for activity  change, appetite change and fatigue.  HENT: Negative for congestion, sinus pressure and sore throat.   Eyes: Negative for visual disturbance.  Respiratory: Negative for cough, chest tightness, shortness of breath and wheezing.   Cardiovascular: Negative for chest pain and palpitations.  Gastrointestinal: Negative for abdominal distention, abdominal pain and constipation.  Endocrine: Negative for polydipsia.  Genitourinary: Negative for dysuria and frequency.  Musculoskeletal: Negative for arthralgias and back pain.  Skin: Negative for rash.  Neurological: Negative for tremors, light-headedness and numbness.  Hematological: Does not bruise/bleed easily.  Psychiatric/Behavioral:       See hpi    Objective:  BP 127/84   Pulse (!) 102   Temp 98.3 F (36.8 C) (Oral)   Ht '5\' 4"'$  (1.626 m)   Wt 181 lb 9.6 oz (82.4 kg)   SpO2 99%   BMI 31.17 kg/m   BP/Weight 06/12/2017 03/22/2017 11/20/3298  Systolic BP 762 263 335  Diastolic BP 84 78 80  Wt. (Lbs) 181.6 182.6 178.4  BMI 31.17 31.34 30.62      Physical Exam  Constitutional: She is oriented to person, place, and time. She appears well-developed and well-nourished.  Cardiovascular: Normal rate, normal heart sounds and intact distal pulses.  No murmur heard. Pulmonary/Chest: Effort normal and breath sounds normal. She has no wheezes. She has no rales. She exhibits no tenderness.  Abdominal: Soft. Bowel sounds are normal. She exhibits no distension and no mass. There is no tenderness.  Musculoskeletal: Normal range of motion.  Neurological: She is alert and oriented to person, place, and time.  Skin: Skin  is warm and dry.  Psychiatric: She has a normal mood and affect.     Assessment & Plan:   1. Anxiety and depression Continue Zoloft - busPIRone (BUSPAR) 5 MG tablet; Take 1 tablet (5 mg total) by mouth 3 (three) times daily.  Dispense: 90 tablet; Refill: 3  2. Acute non-recurrent sinusitis of other sinus Continue with Flonase  and Zyrtec - amoxicillin (AMOXIL) 500 MG capsule; Take 1 capsule (500 mg total) by mouth 3 (three) times daily.  Dispense: 30 capsule; Refill: 0  3. Vitamin D deficiency Completed replacement therapy - VITAMIN D 25 Hydroxy (Vit-D Deficiency, Fractures)  4. Screening for metabolic disorder - OAC16+SAYT; Future - Lipid panel; Future  5. Abnormal uterine bleeding Currently on Depo-Provera   Meds ordered this encounter  Medications  . busPIRone (BUSPAR) 5 MG tablet    Sig: Take 1 tablet (5 mg total) by mouth 3 (three) times daily.    Dispense:  90 tablet    Refill:  3  . amoxicillin (AMOXIL) 500 MG capsule    Sig: Take 1 capsule (500 mg total) by mouth 3 (three) times daily.    Dispense:  30 capsule    Refill:  0    Follow-up: Return in about 3 months (around 09/12/2017) for follow up of chronic medical conditions.   Charlott Rakes MD

## 2017-06-12 NOTE — Patient Instructions (Signed)
Sangrado uterino anormal °(Abnormal Uterine Bleeding) °Sangrado uterino anormal significa que hay un sangrado por la vagina que no es su período menstrual normal. Puede ser: °· Pérdidas de sangre o hemorragias entre los períodos. °· Hemorragias luego de tener sexo (relaciones sexuales). °· Sangrado abundante o más que lo habitual. °· Períodos que duran más que lo normal. °· Sangrado luego de la menopausia. °Hay muchos problemas que pueden ser la causa. El tratamiento dependerá de la causa del sangrado. Cualquier tipo de sangrado que no sea normal debe consultarse con el médico. °CUIDADOS EN EL HOGAR °Controle su afección para ver si hay cambios. Estas indicaciones podrán disminuir cualquier molestia que tenga: °· No use tampones ni duchas vaginales o como le haya indicado el médico. °· Cambie los apósitos con frecuencia. °Deberá hacerse exámenes pélvicos regulares y pruebas de Papanicolaou. Realice los estudios indicados según le indique su médico. °SOLICITE AYUDA SI: °· El sangrado dura más de 1 semana. °· Se siente mareada por momentos. ° °SOLICITE AYUDA DE INMEDIATO SI: °· Se desmaya. °· Tiene que cambiarse los apósitos cada 15 a 30 minutos. °· Siente dolor en el abdomen. °· Tiene fiebre. °· Se siente débil o presenta sudoración. °· Elimina coágulos grandes por la vagina. °· Siente malestar estomacal (náuseas) y devuelve (vomita). ° °ASEGÚRESE DE QUE: °· Comprende estas instrucciones. °· Controlará su afección. °· Recibirá ayuda de inmediato si no mejora o si empeora. ° °Esta información no tiene como fin reemplazar el consejo del médico. Asegúrese de hacerle al médico cualquier pregunta que tenga. °Document Released: 04/09/2010 Document Revised: 03/12/2013 Document Reviewed: 10/04/2012 °Elsevier Interactive Patient Education © 2017 Elsevier Inc. ° °

## 2017-06-13 ENCOUNTER — Ambulatory Visit: Payer: Self-pay | Attending: Family Medicine

## 2017-06-13 ENCOUNTER — Other Ambulatory Visit: Payer: Self-pay

## 2017-06-13 DIAGNOSIS — Z13228 Encounter for screening for other metabolic disorders: Secondary | ICD-10-CM | POA: Insufficient documentation

## 2017-06-13 LAB — VITAMIN D 25 HYDROXY (VIT D DEFICIENCY, FRACTURES): VIT D 25 HYDROXY: 29.1 ng/mL — AB (ref 30.0–100.0)

## 2017-06-13 MED ORDER — BUSPIRONE HCL 7.5 MG PO TABS: 7.5000 mg | ORAL_TABLET | Freq: Three times a day (TID) | ORAL | 3 refills | Status: DC

## 2017-06-13 NOTE — Progress Notes (Signed)
Patient here for lab visit only 

## 2017-06-14 LAB — CMP14+EGFR
A/G RATIO: 1.6 (ref 1.2–2.2)
ALBUMIN: 4.3 g/dL (ref 3.5–5.5)
ALT: 14 IU/L (ref 0–32)
AST: 10 IU/L (ref 0–40)
Alkaline Phosphatase: 62 IU/L (ref 39–117)
BUN / CREAT RATIO: 16 (ref 9–23)
BUN: 10 mg/dL (ref 6–20)
Bilirubin Total: <0.2 mg/dL (ref 0.0–1.2)
CHLORIDE: 106 mmol/L (ref 96–106)
CO2: 21 mmol/L (ref 20–29)
Calcium: 9 mg/dL (ref 8.7–10.2)
Creatinine, Ser: 0.64 mg/dL (ref 0.57–1.00)
GFR calc non Af Amer: 116 mL/min/{1.73_m2} (ref >59)
GFR, EST AFRICAN AMERICAN: 134 mL/min/{1.73_m2} (ref >59)
GLOBULIN, TOTAL: 2.7 g/dL (ref 1.5–4.5)
Glucose: 97 mg/dL (ref 65–99)
POTASSIUM: 4.7 mmol/L (ref 3.5–5.2)
SODIUM: 139 mmol/L (ref 134–144)
TOTAL PROTEIN: 7 g/dL (ref 6.0–8.5)

## 2017-06-14 LAB — LIPID PANEL
CHOLESTEROL TOTAL: 204 mg/dL — AB (ref 100–199)
Chol/HDL Ratio: 4.9 ratio — ABNORMAL HIGH (ref 0.0–4.4)
HDL: 42 mg/dL (ref >39)
LDL Calculated: 121 mg/dL — ABNORMAL HIGH (ref 0–99)
TRIGLYCERIDES: 204 mg/dL — AB (ref 0–149)
VLDL CHOLESTEROL CAL: 41 mg/dL — AB (ref 5–40)

## 2017-06-15 ENCOUNTER — Telehealth: Payer: Self-pay

## 2017-06-15 NOTE — Telephone Encounter (Signed)
Patient was called and informed of lab results via interpretor. 

## 2017-07-05 ENCOUNTER — Ambulatory Visit: Payer: Self-pay | Attending: Family Medicine | Admitting: Physician Assistant

## 2017-07-05 VITALS — BP 138/88 | HR 101 | Temp 99.1°F | Resp 16 | Ht 64.0 in | Wt 184.8 lb

## 2017-07-05 DIAGNOSIS — L03811 Cellulitis of head [any part, except face]: Secondary | ICD-10-CM | POA: Insufficient documentation

## 2017-07-05 DIAGNOSIS — N938 Other specified abnormal uterine and vaginal bleeding: Secondary | ICD-10-CM | POA: Insufficient documentation

## 2017-07-05 DIAGNOSIS — G5601 Carpal tunnel syndrome, right upper limb: Secondary | ICD-10-CM | POA: Insufficient documentation

## 2017-07-05 DIAGNOSIS — Z79899 Other long term (current) drug therapy: Secondary | ICD-10-CM | POA: Insufficient documentation

## 2017-07-05 DIAGNOSIS — N921 Excessive and frequent menstruation with irregular cycle: Secondary | ICD-10-CM

## 2017-07-05 MED ORDER — SULFAMETHOXAZOLE-TRIMETHOPRIM 800-160 MG PO TABS: 1.0000 | ORAL_TABLET | Freq: Two times a day (BID) | ORAL | 0 refills | Status: DC

## 2017-07-05 MED ORDER — FLUCONAZOLE 150 MG PO TABS: 150.0000 mg | ORAL_TABLET | Freq: Once | ORAL | 0 refills | Status: AC

## 2017-07-05 MED ORDER — NAPROXEN 500 MG PO TABS: 500.0000 mg | ORAL_TABLET | Freq: Two times a day (BID) | ORAL | 1 refills | Status: DC

## 2017-07-05 MED FILL — FLUCONAZOLE 150 MG TABLET: 150 | 1 days supply | Qty: 1 | Fill #0

## 2017-07-05 MED FILL — ?CETIRIZINE HCL 10 MG TABLE: 10 | 30 days supply | Qty: 30 | Fill #0

## 2017-07-05 MED FILL — SULFAMETHOXAZOLE-TMP DS TAB: 800-160 | 10 days supply | Qty: 20 | Fill #0

## 2017-07-05 MED FILL — ?NAPROXEN 500MG TABLET: 500 | 30 days supply | Qty: 60 | Fill #0

## 2017-07-05 NOTE — Progress Notes (Signed)
Pt. Stated she has been having numbness on her right hand at night.   Pt. Stated she would have to get up and not be able to sleep. Pt. Stated started 4 weeks already.   Pt. Stated she had a headache yesterday, the spot was red, no hair.

## 2017-07-05 NOTE — Progress Notes (Signed)
Patient ID: Tracie [name] [name], female   DOB: [birth year], 37 y.o.   MRN: [id number]     Tracie [name] [name], is a 37 y.o. female  WUJ:[id number]  [id number]  DOB - [birth year]  Subjective:  Chief Complaint and HPI: Tracie [name] [name] is a 37 y.o. female here today with multiple issues. 1- Some spotting daily since starting Depo Provera almost 3 months ago. 2- Also, small knot on her scalp that is painful.  This has been present for about 1 week. 3-For about 1 month, R hand and wrist is going to sleep and painful at night. NKI.  +R hand dominant.  4-allergies  ROS:   Constitutional:  No f/c, No night sweats, No unexplained weight loss. EENT:  No vision changes, No blurry vision, No hearing changes. No mouth, throat, or ear problems.  Respiratory: No cough, No SOB Cardiac: No CP, no palpitations GI:  No abd pain, No N/V/D. GU: No Urinary s/sx Musculoskeletal:+ R wrist pain Neuro: +mild headache, no dizziness, no motor weakness.  Skin: No rash Endocrine:  No polydipsia. No polyuria.  Psych: Denies SI/HI  No problems updated.  ALLERGIES: No Known Allergies  PAST MEDICAL HISTORY: Past Medical History:  Diagnosis Date  . Headache     MEDICATIONS AT HOME: Prior to Admission medications   Medication Sig Start Date End Date Taking? Authorizing Provider  hydrOXYzine (ATARAX/VISTARIL) 10 MG tablet Take 1 tablet (10 mg total) by mouth 3 (three) times daily as needed. 02/22/17  Yes Hoy Register, MD  naproxen (NAPROSYN) 500 MG tablet Take 1 tablet (500 mg total) by mouth 2 (two) times daily with a meal. X 10 days then prn pain/inflammation 07/05/17  Yes Krisi Azua M, PA-C  sertraline (ZOLOFT) 100 MG tablet Take 1 tablet (100 mg total) by mouth daily. 06/12/17  Yes Hoy Register, MD  fluconazole (DIFLUCAN) 150 MG tablet Take 1 tablet (150 mg total) by mouth once for 1 dose. 07/05/17 07/05/17  Anders Simmonds, PA-C  Phenylephrine-Acetaminophen (VICKS DAYQUIL SINEX) 5-325 MG  CAPS Take by mouth.    [provider]  sulfamethoxazole-trimethoprim (BACTRIM DS,SEPTRA DS) 800-160 MG tablet Take 1 tablet by mouth 2 (two) times daily. 07/05/17   Anders Simmonds, PA-C  PARoxetine (PAXIL) 20 MG tablet Take 1 tablet (20 mg total) by mouth daily. Patient not taking: Reported on 01/12/2015 09/18/14 01/29/15  Quentin Angst, MD  SUMAtriptan (IMITREX) 50 MG tablet Take 1 tablet (50 mg total) by mouth every 2 (two) hours as needed for migraine or headache. May repeat in 2 hours if headache persists or recurs. Patient not taking: Reported on 06/12/2014 05/17/14 01/29/15  Reuben Likes, MD  traZODone (DESYREL) 50 MG tablet Take 1 tablet (50 mg total) by mouth at bedtime. Patient not taking: Reported on 08/21/2014 06/12/14 01/29/15  Quentin Angst, MD     Objective:  EXAM:   Vitals:   07/05/17 1334  BP: 138/88  Pulse: (!) 101  Resp: 16  Temp: 99.1 F (37.3 C)  TempSrc: Oral  SpO2: 99%  Weight: 184 lb 12.8 oz (83.8 kg)  Height: 5\' 4"  (1.626 m)    General appearance : A&OX3. NAD. Non-toxic-appearing HEENT: Atraumatic and Normocephalic.  PERRLA. EOM intact.  TM clear B. Mouth-MMM, post pharynx WNL w/o erythema, + PND. Turbinates pales, boggy and enlarged Neck: supple, no JVD. No cervical lymphadenopathy. No thyromegaly Chest/Lungs:  Breathing-non-labored, Good air entry bilaterally, breath sounds normal without rales, rhonchi, or wheezing  CVS: S1 S2 regular, no murmurs, gallops, rubs  Extremities: Bilateral Lower Ext shows no edema, both legs are warm to touch with = pulse throughout.  R wrist vs L.  Both with Full S&ROM.  Normal grip and sensory.  R wrist with +Phalen's; neg tinels.   1cm tender and mildly erythematous and fluctuant area on R parietal scalp.  No induration Neurology:  CN II-XII grossly intact, Non focal.   Psych:  TP linear. J/I WNL. Normal speech. Appropriate eye contact and affect.  Skin:  No Rash  Data Review Lab Results    Component Value Date   HGBA1C 5.6 10/23/2015   HGBA1C 5.5 09/09/2013     Assessment & Plan   1. Carpal tunnel syndrome of right wrist R wrist splint X 2 weeks 24/7 the 2 more weeks at night - naproxen (NAPROSYN) 500 MG tablet; Take 1 tablet (500 mg total) by mouth 2 (two) times daily with a meal. X 10 days then prn pain/inflammation  Dispense: 60 tablet; Refill: 1  2. Cellulitis of head or scalp - sulfamethoxazole-trimethoprim (BACTRIM DS,SEPTRA DS) 800-160 MG tablet; Take 1 tablet by mouth 2 (two) times daily.  Dispense: 20 tablet; Refill: 0 - fluconazole (DIFLUCAN) 150 MG tablet; Take 1 tablet (150 mg total) by mouth once for 1 dose.  Dispense: 1 tablet; Refill: 0  3. Breakthrough bleeding on depo provera Continue depo provera on schedule.  Discussed at length this as common side effect.  Bleeding is not heavy.    4.  Allergies-add flonase   Patient have been counseled extensively about nutrition and exercise  Return for keep July appt with Dr Alvis LemmingsNewlin.  The patient was given clear instructions to go to ER or return to medical center if symptoms don't improve, worsen or new problems develop. The patient verbalized understanding. The patient was told to call to get lab results if they haven't heard anything in the next week.     Georgian CoAngela Lakisha Peyser, PA-C [facility] [place], [place] [phone]   07/05/2017, 2:05 PM

## 2017-07-05 NOTE — Patient Instructions (Addendum)
Go to [employer] or [employer] and go to the pharmacy and ask them to help you find a right Wrist splint.  Wear this day and night for 2 weeks then for an additional 2 weeks at night.      Sndrome del tnel carpiano (Carpal Tunnel Syndrome) El sndrome del tnel carpiano es una afeccin que causa dolor en la mano y en el brazo. El tnel carpiano es un espacio estrecho ubicado en el lado palmar de la [place]. Los movimientos repetidos de la mueca o determinadas enfermedades pueden causar la hinchazn del tnel. Esta hinchazn puede comprimir el nervio principal de la mueca (nervio mediano). CUIDADOS EN EL HOGAR Si tiene una frula:  sela como se lo haya indicado el mdico. Qutesela solamente como se lo haya indicado el mdico.  Afloje la frula si los dedos: ? Se le adormecen y siente hormigueos. ? Se le ponen azulados y fros.  Mantenga la frula limpia y seca. Instrucciones generales  [employer] de venta libre y los recetados solamente como se lo haya indicado el mdico.  Haga reposar la [place] de toda actividad que le cause dolor. Si es necesario, hable con su empleador [employer] cambios que pueden hacerse en su lugar de [place], por ejemplo, usar una almohadilla para apoyar la mueca mientras tipea.  Si se lo indican, aplique hielo sobre la zona dolorida: ? Nature conservation officer hielo en una bolsa plstica. ? Coloque una [employer] piel y la bolsa de hielo. ? Coloque el hielo durante , 2 a 3veces por da.  Concurra a todas las visitas de control como se lo haya indicado el mdico. Esto es importante.  Haga los ejercicios como se lo hayan indicado el mdico, el fisioterapeuta o el terapeuta ocupacional. SOLICITE AYUDA SI:  Aparecen nuevos sntomas.  Los medicamentos no Tourist information centre manager.  Los sntomas empeoran. Esta informacin no tiene Theme park manager el consejo del mdico. Asegrese de hacerle al mdico cualquier pregunta que tenga. Document Released:  02/24/2011 Document Revised: 11/26/2014 Document Reviewed: 07/23/2014 Elsevier Interactive Patient Education  2018 [employer]. Informacin sobre los anticonceptivos hormonales (Hormonal Contraception Information) El estrgeno y la progesterona (progestina) son hormonas usadas en muchas formas de control de natalidad (contracepcin). Estas 2 hormonas se encuentran en la mayora de los anticonceptivos hormonales. Los anticonceptivos hormonales utilizan:   Una combinacin de las hormonas estrgeno y progesterona en una de estas formas: ? Pldoras: vienen en varias combinaciones de pldoras con hormona activa y pldoras no hormonales. Las [employer] combinaciones pueden provocar que tenga el perodo una vez por mes, una vez cada 3 meses o que no tenga su perodo. Es importante que las tome a la misma hora todos [place]. ? El parche: se coloca en la parte inferior del abdomen todas las semanas durante 3 semanas. No se coloca en la cuarta semana. ? Anillo vaginal: se coloca en la vagina y se deja all durante 3 semanas. Luego se retira por una semana.  Progesterona sola en forma de: ? Pldoras: las pldoras hormonales se toman todos los [address] del [place]. ? El dispositivo intrauterino (DIU): se inserta durante un perodo menstrual y se retira o se reemplaza cada 5 aos o menos. ? Implante: Se colocan unas varillas plsticas debajo de la piel, en la zona superior del brazo. Se retiran o se reemplazan cada 3 aos o menos. ? Inyeccin: se aplica una vez cada [address] Puede producirse un embarazo con cualquiera de estos mtodos anticonceptivos hormonales. Si sospecha  que est embarazada, hgase un test de embarazo y converse con su mdico.  ANTICONCEPTIVOS DE ESTRGENO Y PROGESTERONA Los anticonceptivos de estrgeno y progesterona pueden evitar un embarazo:  Impidiendo la liberacin de un vulo (ovulacin).  Espesando el moco cervical lo que dificulta la entrada de los espermatozoides al  tero.  Modificando el revestimiento interno del tero. Esta modificacin hace que sea ms difcil que se implante el huevo. Los efectos secundarios de los estrgenos son ms frecuentes en los primeros 2 o 3 meses. Hable con su mdico Lockheed Martinsobre los efectos secundarios que pueden afectarla. Si usted tiene Calpine Corporationefectos secundarios persistentes o son graves, hable con su mdico. ANTICONCEPTIVOS DE PROGESTERONA Los anticonceptivos que solo contienen progesterona pueden evitar el embarazo a travs de:   El bloqueo de la ovulacin. Esto se produce en muchas mujeres, pero algunas continuarn ovulando.   Impidiendo la entrada de los espermatozoides en el tero al mantener el moco cervical espeso y pegajoso.   Modificando el revestimiento interno del tero. Este cambio hace que sea ms difcil que se implante el huevo.  Los efectos secundarios de la progesterona pueden variar. Hable con su mdico Lockheed Martinsobre los efectos secundarios que pueden afectarla. Si usted tiene Calpine Corporationefectos secundarios persistentes o son graves, hable con su mdico.  Esta informacin no tiene Theme park managercomo fin reemplazar el consejo del mdico. Asegrese de hacerle al mdico cualquier pregunta que tenga. Document Released: 11/30/2011 Document Revised: 11/07/2012 Elsevier Interactive Patient Education  2017 [employer]

## 2017-07-13 ENCOUNTER — Ambulatory Visit: Payer: Self-pay | Attending: Family Medicine | Admitting: Family Medicine

## 2017-07-13 ENCOUNTER — Encounter: Payer: Self-pay | Admitting: Family Medicine

## 2017-07-13 VITALS — BP 119/73 | HR 85 | Temp 98.5°F | Ht 64.0 in | Wt 185.6 lb

## 2017-07-13 DIAGNOSIS — F329 Major depressive disorder, single episode, unspecified: Secondary | ICD-10-CM | POA: Insufficient documentation

## 2017-07-13 DIAGNOSIS — L729 Follicular cyst of the skin and subcutaneous tissue, unspecified: Secondary | ICD-10-CM

## 2017-07-13 DIAGNOSIS — L639 Alopecia areata, unspecified: Secondary | ICD-10-CM

## 2017-07-13 DIAGNOSIS — L728 Other follicular cysts of the skin and subcutaneous tissue: Secondary | ICD-10-CM | POA: Insufficient documentation

## 2017-07-13 MED ORDER — SELENIUM SULFIDE 2.25 % EX SHAM: 1.0000 "application " | MEDICATED_SHAMPOO | CUTANEOUS | 0 refills | Status: DC

## 2017-07-13 MED ORDER — TERBINAFINE HCL 250 MG PO TABS: 250.0000 mg | ORAL_TABLET | Freq: Every day | ORAL | 0 refills | Status: DC

## 2017-07-13 MED FILL — TERBINAFINE HCL 250 MG TAB: 250 | 14 days supply | Qty: 14 | Fill #0

## 2017-07-13 NOTE — Patient Instructions (Signed)
Alopecia Areata (Alopecia Areata) La alopecia areata es un tipo de calvicie. Si tiene esta afeccin, el pelo del cuero cabelludo se le puede caer por zonas. En algunos casos, se puede perder todo el pelo del cuero cabelludo (alopecia totalis) o todo el vello del rostro y del cuerpo (alopecia universalis).  La alopecia areata es una enfermedad autoinmunitaria, lo que significa que el sistema de defensa del organismo (sistema inmunitario) confunde las partes normales del cuerpo con microbios y otras cosas que pueden causarle enfermedades. Cuando tiene alopecia areata, el sistema inmunitario ataca los folculos pilosos.  La alopecia areata suele comenzar durante la niez, pero puede ocurrir a cualquier edad. No representa un peligro para la salud, pero puede ser estresante.  CAUSAS  La causa de la alopecia areata es desconocida.  FACTORES DE RIESGO Puede correr un riesgo ms elevado de tener alopecia areata si:   Tiene antecedentes familiares de alopecia.  Tiene antecedentes familiares de otra enfermedad autoinmunitaria, incluidas diabetes tipo1 y artritis reumatoide. SIGNOS Y SNTOMAS Los signos de alopecia pueden incluir los siguientes:  Cada del pelo del cuero cabelludo por zonas pequeas y redondeadas que pueden tener el tamao de una moneda de 25centavos de dlar.  Cada de todo el pelo del cuero cabelludo.  Cada del vello de las cejas, del rostro y del que crece dentro de la nariz (vello nasal).  Cada del vello de todo el cuerpo. DIAGNSTICO  La alopecia areata puede diagnosticarse mediante:  Examen fsico e historia clnica.  La toma de una muestra de pelo para analizarla con un microscopio.  La extraccin de un pequeo trozo de piel (biopsia) para su examen con un microscopio.  Anlisis de sangre para descartar otras enfermedades autoinmunitarias. TRATAMIENTO  No hay cura para la alopecia areata, pero la enfermedad suele desaparecer con el tiempo. No perder la capacidad  de que el pelo le crezca nuevamente. Algunos medicamentos pueden ayudar a que el pelo le crezca otra vez con mayor rapidez. Estos incluyen los siguientes:  Corticoides. Estos medicamentos controlan la inflamacin causada por el sistema inmunitario. Puede aplicarse este medicamento como una locin para la piel o como una inyeccin.  Minoxidil. Este es un medicamento para el crecimiento capilar que puede aplicar en las zonas donde se le ha cado el pelo.  Antralina. Este es un medicamento para una inflamacin cutnea llamada psoriasis que tambin puede contribuir a la alopecia.  Difenciprona. Este medicamento se aplica sobre la piel y puede estimular el crecimiento del pelo. INSTRUCCIONES PARA EL CUIDADO EN EL HOGAR  Aplquese pantalla solar o cbrase la cabeza cuando est al aire libre.  Tome los medicamentos solamente como se lo haya indicado el mdico.  Si se le han cado las cejas, use anteojos de sol cuando est al aire libre para evitar que le entre polvillo en los ojos.  Si se le ha cado el vello que crece dentro de la nariz, pngase un pauelo sobre el rostro o aplquese un ungento en las fosas nasales. Esto impide que entre polvillo y otras sustancias irritantes.  Concurra a todas las visitas de control como se lo haya indicado el mdico. Esto es importante. SOLICITE ATENCIN MDICA SI:  Los sntomas cambian.  Aparecen nuevos sntomas.  Tiene una reaccin a los medicamentos.  Tiene problemas emocionales. Esta informacin no tiene como fin reemplazar el consejo del mdico. Asegrese de hacerle al mdico cualquier pregunta que tenga. Document Released: 12/26/2012 Document Revised: 03/28/2014 Elsevier Interactive Patient Education  2017 Elsevier Inc.  

## 2017-07-13 NOTE — Progress Notes (Signed)
Patient has bump in her scalp.

## 2017-07-13 NOTE — Progress Notes (Signed)
Subjective:  Patient ID: Tracie [name], female    DOB: 19-Nov-1980  Age: 37 y.o. MRN: [id number]  CC:   HPI Tracie [name] is a 37 year old female with a history of anxiety and depression who presents today for an acute visit complaining of a central scalp lesion which has been present for 2 week and is itchy. At her last visit with the PA last week she was placed on Bactrim for treatment of scalp cellulitis however she reports no improvement in symptoms.  She denies discharge from the lesion and has noticed an area of hair loss at the same site. She washes her hair with regular shampoo daily. Denies fevers or presence of similar lesions in other body parts.  Past Medical History:  Diagnosis Date  . Headache     History reviewed. No pertinent surgical history.  No Known Allergies   Outpatient Medications Prior to Visit  Medication Sig Dispense Refill  . hydrOXYzine (ATARAX/VISTARIL) 10 MG tablet Take 1 tablet (10 mg total) by mouth 3 (three) times daily as needed. 90 tablet 6  . sertraline (ZOLOFT) 100 MG tablet Take 1 tablet (100 mg total) by mouth daily. 30 tablet 3  . naproxen (NAPROSYN) 500 MG tablet Take 1 tablet (500 mg total) by mouth 2 (two) times daily with a meal. X 10 days then prn pain/inflammation (Patient not taking: Reported on 07/13/2017) 60 tablet 1  . Phenylephrine-Acetaminophen (VICKS DAYQUIL SINEX) 5-325 MG CAPS Take by mouth.    . sulfamethoxazole-trimethoprim (BACTRIM DS,SEPTRA DS) 800-160 MG tablet Take 1 tablet by mouth 2 (two) times daily. (Patient not taking: Reported on 07/13/2017) 20 tablet 0   No facility-administered medications prior to visit.     ROS Review of Systems  Constitutional: Negative for activity change, appetite change and fatigue.  HENT: Negative for congestion, sinus pressure and sore throat.   Eyes: Negative for visual disturbance.  Respiratory: Negative for cough, chest tightness, shortness of breath and wheezing.     Cardiovascular: Negative for chest pain and palpitations.  Gastrointestinal: Negative for abdominal distention, abdominal pain and constipation.  Endocrine: Negative for polydipsia.  Genitourinary: Negative for dysuria and frequency.  Musculoskeletal: Negative for arthralgias and back pain.  Skin: Positive for rash (0.5cm area of fluctuant induration with patchy hair loss, nopunctum, no discharge noted).  Neurological: Negative for tremors, light-headedness and numbness.  Hematological: Does not bruise/bleed easily.  Psychiatric/Behavioral: Negative for agitation and behavioral problems.    Objective:  BP 119/73   Pulse 85   Temp 98.5 F (36.9 C) (Oral)   Ht 5\' 4"  (1.626 m)   Wt 185 lb 9.6 oz (84.2 kg)   SpO2 99%   BMI 31.86 kg/m   BP/Weight 07/13/2017 07/05/2017 06/12/2017  Systolic BP 119 138 127  Diastolic BP 73 88 84  Wt. (Lbs) 185.6 184.8 181.6  BMI 31.86 31.72 31.17   Physical Exam  Constitutional: She is oriented to person, place, and time. She appears well-developed and well-nourished.  Cardiovascular: Normal rate, normal heart sounds and intact distal pulses.  No murmur heard. Pulmonary/Chest: Effort normal and breath sounds normal. She has no wheezes. She has no rales. She exhibits no tenderness.  Abdominal: Soft. Bowel sounds are normal. She exhibits no distension and no mass. There is no tenderness.  Musculoskeletal: Normal range of motion.  Neurological: She is alert and oriented to person, place, and time.  Skin: Rash (0.5cm area of fluctuant induration with patchy hair loss, nopunctum, no discharge noted) noted.  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 Scalp cyst Unknown etiology Completed course of Bactrim with no relief improvement That condition is self-limiting.   Meds ordered this encounter  Medications  . terbinafine (LAMISIL) 250 MG tablet    Sig:  Take 1 tablet (250 mg total) by mouth daily.    Dispense:  14 tablet    Refill:  0  . Selenium Sulfide 2.25 % SHAM    Sig: Apply 1 application topically 2 (two) times a week.    Dispense:  180 mL    Refill:  0    Follow-up: Return if symptoms worsen or fail to improve, for follow up of chronic medical conditions keep previous appointment.Hoy Register.   Wofford Stratton MD

## 2017-07-25 MED FILL — SERTRALINE HCL 100 MG TAB: 100 | 30 days supply | Qty: 30 | Fill #0

## 2017-08-11 ENCOUNTER — Ambulatory Visit: Payer: Self-pay | Attending: Family Medicine

## 2017-08-25 MED FILL — SERTRALINE HCL 100 MG TAB: 100 | 30 days supply | Qty: 30 | Fill #1

## 2017-08-29 ENCOUNTER — Encounter: Payer: Self-pay | Admitting: Family Medicine

## 2017-08-29 ENCOUNTER — Ambulatory Visit: Payer: Self-pay | Attending: Family Medicine | Admitting: Family Medicine

## 2017-08-29 VITALS — BP 134/86 | HR 98 | Temp 98.1°F | Ht 64.0 in | Wt 186.0 lb

## 2017-08-29 DIAGNOSIS — N939 Abnormal uterine and vaginal bleeding, unspecified: Secondary | ICD-10-CM | POA: Insufficient documentation

## 2017-08-29 DIAGNOSIS — R5383 Other fatigue: Secondary | ICD-10-CM | POA: Insufficient documentation

## 2017-08-29 DIAGNOSIS — F419 Anxiety disorder, unspecified: Secondary | ICD-10-CM | POA: Insufficient documentation

## 2017-08-29 DIAGNOSIS — F329 Major depressive disorder, single episode, unspecified: Secondary | ICD-10-CM | POA: Insufficient documentation

## 2017-08-29 DIAGNOSIS — M2550 Pain in unspecified joint: Secondary | ICD-10-CM | POA: Insufficient documentation

## 2017-08-29 MED ORDER — SERTRALINE HCL 100 MG PO TABS: 100.0000 mg | ORAL_TABLET | Freq: Every day | ORAL | 6 refills | Status: DC

## 2017-08-29 MED ORDER — HYDROXYZINE HCL 10 MG PO TABS: 10.0000 mg | ORAL_TABLET | Freq: Three times a day (TID) | ORAL | 6 refills | Status: DC | PRN

## 2017-08-29 MED ORDER — MEDROXYPROGESTERONE ACETATE 10 MG PO TABS: 10.0000 mg | ORAL_TABLET | Freq: Every day | ORAL | 0 refills | Status: DC

## 2017-08-29 MED FILL — MEDROXYPROGESTERONE 10 MG T: 10 | 10 days supply | Qty: 10 | Fill #0

## 2017-08-29 MED FILL — hydrOXYzine HCL 10 MG TABS: 10 | 30 days supply | Qty: 90 | Fill #0

## 2017-08-29 NOTE — Progress Notes (Signed)
Subjective:  Patient ID: Tracie [name], female    DOB: 12-02-1980  Age: 37 y.o. MRN: [id number]  CC: Fatigue   HPI Tracie Dickenslma [name]  is a 37 year old female with a history of anxiety and depression who presents today with complaints of bleeding ever since she [facility].States she has not had any breaks in the bleeding and is currently not on any contraception. Denies abdominal cramping, nausea or urinary symptoms. She also complains of a 2 week history of pain in her bones waking her up at night which has not responded to the use of Naproxen. Se has had facial pain and dyspnea and also heat waves at night but no fever.  Past Medical History:  Diagnosis Date  . Headache     No past surgical history on file.  No Known Allergies   Outpatient Medications Prior to Visit  Medication Sig Dispense Refill  . hydrOXYzine (ATARAX/VISTARIL) 10 MG tablet Take 1 tablet (10 mg total) by mouth 3 (three) times daily as needed. 90 tablet 6  . sertraline (ZOLOFT) 100 MG tablet Take 1 tablet (100 mg total) by mouth daily. 30 tablet 3  . naproxen (NAPROSYN) 500 MG tablet Take 1 tablet (500 mg total) by mouth 2 (two) times daily with a meal. X 10 days then prn pain/inflammation (Patient not taking: Reported on 07/13/2017) 60 tablet 1  . Phenylephrine-Acetaminophen (VICKS DAYQUIL SINEX) 5-325 MG CAPS Take by mouth.    . Selenium Sulfide 2.25 % SHAM Apply 1 application topically 2 (two) times a week. (Patient not taking: Reported on 08/29/2017) 180 mL 0  . sulfamethoxazole-trimethoprim (BACTRIM DS,SEPTRA DS) 800-160 MG tablet Take 1 tablet by mouth 2 (two) times daily. (Patient not taking: Reported on 07/13/2017) 20 tablet 0  . terbinafine (LAMISIL) 250 MG tablet Take 1 tablet (250 mg total) by mouth daily. (Patient not taking: Reported on 08/29/2017) 14 tablet 0   No facility-administered medications prior to visit.     ROS Review of Systems    Constitutional: Positive for fatigue. Negative for activity change and appetite change.  HENT: Negative for congestion, sinus pressure and sore throat.   Eyes: Negative for visual disturbance.  Respiratory: Negative for cough, chest tightness, shortness of breath and wheezing.   Cardiovascular: Negative for chest pain and palpitations.  Gastrointestinal: Negative for abdominal distention, abdominal pain and constipation.  Endocrine: Negative for polydipsia.  Genitourinary: Positive for menstrual problem. Negative for dysuria and frequency.  Musculoskeletal: Positive for arthralgias. Negative for back pain.  Skin: Negative for rash.  Neurological: Negative for tremors, light-headedness and numbness.  Hematological: Does not bruise/bleed easily.  Psychiatric/Behavioral: Negative for agitation and behavioral problems.    Objective:  BP 134/86   Pulse 98   Temp 98.1 F (36.7 C) (Oral)   Ht 5\' 4"  (1.626 m)   Wt 186 lb (84.4 kg)   SpO2 100%   BMI 31.93 kg/m   BP/Weight 08/29/2017 07/13/2017 07/05/2017  Systolic BP 134 119 138  Diastolic BP 86 73 88  Wt. (Lbs) 186 185.6 184.8  BMI 31.93 31.86 31.72      Physical Exam  Constitutional: She is oriented to person, place, and time. She appears well-developed and well-nourished.  Cardiovascular: Normal rate, normal heart sounds and intact distal pulses.  No murmur heard. Pulmonary/Chest: Effort normal and breath sounds normal. She has no wheezes. She has no rales. She exhibits no tenderness.  Abdominal: Soft. Bowel sounds are normal. She exhibits no distension  and no mass. There is no tenderness.  Musculoskeletal: Normal range of motion.  Neurological: She is alert and oriented to person, place, and time.     Assessment & Plan:   1. Arthralgia, unspecified joint Suspicious for viral syndrome Advised to use OTC analgescis  2. Anxiety and depression Stable - sertraline (ZOLOFT) 100 MG tablet; Take 1 tablet (100 mg total) by  mouth daily.  Dispense: 30 tablet; Refill: 6 - hydrOXYzine (ATARAX/VISTARIL) 10 MG tablet; Take 1 tablet (10 mg total) by mouth 3 (three) times daily as needed.  Dispense: 90 tablet; Refill: 6  3. Abnormal uterine bleeding If symptoms persists with Provera we will try OCP - CBC with Differential/Platelet - medroxyPROGESTERone (PROVERA) 10 MG tablet; Take 1 tablet (10 mg total) by mouth daily.  Dispense: 10 tablet; Refill: 0   Meds ordered this encounter  Medications  . sertraline (ZOLOFT) 100 MG tablet    Sig: Take 1 tablet (100 mg total) by mouth daily.    Dispense:  30 tablet    Refill:  6  . hydrOXYzine (ATARAX/VISTARIL) 10 MG tablet    Sig: Take 1 tablet (10 mg total) by mouth 3 (three) times daily as needed.    Dispense:  90 tablet    Refill:  6  . medroxyPROGESTERone (PROVERA) 10 MG tablet    Sig: Take 1 tablet (10 mg total) by mouth daily.    Dispense:  10 tablet    Refill:  0    Follow-up: Return in about 3 months (around 11/29/2017), or if symptoms worsen or fail to improve, for Follow-up of chronic medical conditions.   Hoy Register MD

## 2017-08-29 NOTE — Progress Notes (Signed)
C/C: pain in joints, bleeding since depo shot in January.

## 2017-08-30 ENCOUNTER — Encounter: Payer: Self-pay | Admitting: Family Medicine

## 2017-08-30 LAB — CBC WITH DIFFERENTIAL/PLATELET
BASOS: 0 %
Basophils Absolute: 0 10*3/uL (ref 0.0–0.2)
EOS (ABSOLUTE): 0.2 10*3/uL (ref 0.0–0.4)
EOS: 2 %
HEMATOCRIT: 35 % (ref 34.0–46.6)
Hemoglobin: 11.1 g/dL (ref 11.1–15.9)
IMMATURE GRANULOCYTES: 0 %
Immature Grans (Abs): 0 10*3/uL (ref 0.0–0.1)
LYMPHS ABS: 2.2 10*3/uL (ref 0.7–3.1)
Lymphs: 30 %
MCH: 25.7 pg — AB (ref 26.6–33.0)
MCHC: 31.7 g/dL (ref 31.5–35.7)
MCV: 81 fL (ref 79–97)
MONOS ABS: 0.4 10*3/uL (ref 0.1–0.9)
Monocytes: 6 %
NEUTROS ABS: 4.5 10*3/uL (ref 1.4–7.0)
NEUTROS PCT: 62 %
PLATELETS: 319 10*3/uL (ref 150–450)
RBC: 4.32 x10E6/uL (ref 3.77–5.28)
RDW: 15 % (ref 12.3–15.4)
WBC: 7.4 10*3/uL (ref 3.4–10.8)

## 2017-09-19 MED FILL — SERTRALINE HCL 100 MG TAB: 100 | 30 days supply | Qty: 30 | Fill #2

## 2017-09-22 ENCOUNTER — Telehealth: Payer: Self-pay

## 2017-09-22 NOTE — Telephone Encounter (Signed)
Patient was called and informed of lab results.Tracie [name](lilia [phone])

## 2017-10-02 ENCOUNTER — Encounter: Payer: Self-pay | Admitting: Family Medicine

## 2017-10-02 ENCOUNTER — Ambulatory Visit: Payer: Self-pay | Attending: Family Medicine | Admitting: Family Medicine

## 2017-10-02 VITALS — BP 106/70 | HR 98 | Temp 98.2°F | Resp 18 | Ht 65.0 in | Wt 186.0 lb

## 2017-10-02 DIAGNOSIS — H6122 Impacted cerumen, left ear: Secondary | ICD-10-CM

## 2017-10-02 DIAGNOSIS — J3489 Other specified disorders of nose and nasal sinuses: Secondary | ICD-10-CM

## 2017-10-02 DIAGNOSIS — E559 Vitamin D deficiency, unspecified: Secondary | ICD-10-CM | POA: Insufficient documentation

## 2017-10-02 DIAGNOSIS — R5383 Other fatigue: Secondary | ICD-10-CM | POA: Insufficient documentation

## 2017-10-02 DIAGNOSIS — F329 Major depressive disorder, single episode, unspecified: Secondary | ICD-10-CM | POA: Insufficient documentation

## 2017-10-02 DIAGNOSIS — M2550 Pain in unspecified joint: Secondary | ICD-10-CM | POA: Insufficient documentation

## 2017-10-02 MED ORDER — CYCLOBENZAPRINE HCL 10 MG PO TABS: 10.0000 mg | ORAL_TABLET | Freq: Three times a day (TID) | ORAL | 1 refills | Status: DC | PRN

## 2017-10-02 MED ORDER — CETIRIZINE HCL 10 MG PO TABS: 10.0000 mg | ORAL_TABLET | Freq: Every day | ORAL | 1 refills | Status: DC

## 2017-10-02 MED FILL — CYCLOBENZAPRINE 10 MG TAB: 10 | 20 days supply | Qty: 60 | Fill #0

## 2017-10-02 NOTE — Progress Notes (Signed)
Subjective:  Patient ID: Tracie [name], female    DOB: 1980-09-21  Age: 37 y.o. MRN: [id number]  CC: fatigue HPI Tracie Dickenslma [name] is a 37 year old female with a history of anxiety and depression who presents today with complaints of arthralgias for the last 2 months with associated fatigue which she describes as waking up with decreased energy.  She sleeps for 9 hours and her sleep is not interrupted.  She does not exercise but is active at home with her ADLs and house chores.  He has a positive history of vitamin D deficiency with a level of 29.1 and normal thyroid from 11/2017. Denies fevers or history of sick contacts. She also complains of persistent nasal congestion and at other times dried mucus in her nostrils which is not relieved by the use of Flonase.  She also has otalgia but no tinnitus or vertigo.  Past Medical History:  Diagnosis Date  . Headache     History reviewed. No pertinent surgical history.  No Known Allergies  Outpatient Medications Prior to Visit  Medication Sig Dispense Refill  . hydrOXYzine (ATARAX/VISTARIL) 10 MG tablet Take 1 tablet (10 mg total) by mouth 3 (three) times daily as needed. 90 tablet 6  . medroxyPROGESTERone (PROVERA) 10 MG tablet Take 1 tablet (10 mg total) by mouth daily. 10 tablet 0  . Phenylephrine-Acetaminophen (VICKS DAYQUIL SINEX) 5-325 MG CAPS Take by mouth.    . sertraline (ZOLOFT) 100 MG tablet Take 1 tablet (100 mg total) by mouth daily. 30 tablet 6  . naproxen (NAPROSYN) 500 MG tablet Take 1 tablet (500 mg total) by mouth 2 (two) times daily with a meal. X 10 days then prn pain/inflammation (Patient not taking: Reported on 07/13/2017) 60 tablet 1  . Selenium Sulfide 2.25 % SHAM Apply 1 application topically 2 (two) times a week. (Patient not taking: Reported on 08/29/2017) 180 mL 0  . terbinafine (LAMISIL) 250 MG tablet Take 1 tablet (250 mg total) by mouth daily. (Patient not taking: Reported on 08/29/2017) 14 tablet 0  .  sulfamethoxazole-trimethoprim (BACTRIM DS,SEPTRA DS) 800-160 MG tablet Take 1 tablet by mouth 2 (two) times daily. (Patient not taking: Reported on 07/13/2017) 20 tablet 0   No facility-administered medications prior to visit.     ROS Review of Systems  Constitutional: Positive for fatigue. Negative for activity change and appetite change.  HENT: Positive for sinus pressure. Negative for congestion and sore throat.   Eyes: Negative for visual disturbance.  Respiratory: Negative for cough, chest tightness, shortness of breath and wheezing.   Cardiovascular: Negative for chest pain and palpitations.  Gastrointestinal: Negative for abdominal distention, abdominal pain and constipation.  Endocrine: Negative for polydipsia.  Genitourinary: Negative for dysuria and frequency.  Musculoskeletal: Positive for arthralgias. Negative for back pain.  Skin: Negative for rash.  Neurological: Negative for tremors, light-headedness and numbness.  Hematological: Does not bruise/bleed easily.  Psychiatric/Behavioral: Negative for agitation and behavioral problems.    Objective:  BP 106/70 (BP Location: Left Arm, Patient Position: Sitting, Cuff Size: Normal)   Pulse 98   Temp 98.2 F (36.8 C) (Oral)   Resp 18   Ht 5\' 5"  (1.651 m)   Wt 186 lb (84.4 kg)   LMP 10/02/2017   SpO2 100%   BMI 30.95 kg/m   BP/Weight 10/02/2017 08/29/2017 07/13/2017  Systolic BP 106 134 119  Diastolic BP 70 86 73  Wt. (Lbs) 186 186 185.6  BMI 30.95 31.93 31.86      Physical Exam  Constitutional:  She is oriented to person, place, and time. She appears well-developed and well-nourished.  HENT:  Right Ear: External ear normal.  Cerumen obscuring left tympanic membrane Frontal sinus tenderness to palpation  Cardiovascular: Normal rate, normal heart sounds and intact distal pulses.  No murmur heard. Pulmonary/Chest: Effort normal and breath sounds normal. She has no wheezes. She has no rales. She exhibits no  tenderness.  Abdominal: Soft. Bowel sounds are normal. She exhibits no distension and no mass. There is no tenderness.  Musculoskeletal: Normal range of motion.  Neurological: She is alert and oriented to person, place, and time.  Skin: Skin is warm and dry.  Psychiatric: She has a normal mood and affect.     Assessment & Plan:   1. Other fatigue Cannot exclude chronic fatigue syndrome Advised to engage in some form of physical activity, water aerobics, yoga or massage - VITAMIN D 25 Hydroxy (Vit-D Deficiency, Fractures) - T4, free - TSH  2. Arthralgia, unspecified joint Chronic arthralgias - cyclobenzaprine (FLEXERIL) 10 MG tablet; Take 1 tablet (10 mg total) by mouth 3 (three) times daily as needed for muscle spasms.  Dispense: 60 tablet; Refill: 1  3. Vitamin D deficiency Previous history of vitamin D deficiency We will check again as this could contribute to her fatigue  4. Cerumen debris on tympanic membrane of left ear Advised to use OTC ceruminolytic's  5. Sinus pressure Uncontrolled on Flonase We will add Zyrtec - cetirizine (ZYRTEC) 10 MG tablet; Take 1 tablet (10 mg total) by mouth daily.  Dispense: 30 tablet; Refill: 1   Meds ordered this encounter  Medications  . cyclobenzaprine (FLEXERIL) 10 MG tablet    Sig: Take 1 tablet (10 mg total) by mouth 3 (three) times daily as needed for muscle spasms.    Dispense:  60 tablet    Refill:  1  . cetirizine (ZYRTEC) 10 MG tablet    Sig: Take 1 tablet (10 mg total) by mouth daily.    Dispense:  30 tablet    Refill:  1    Follow-up: Return for Follow-up of chronic medical conditions, keep previously scheduled appointment.   Hoy Register MD

## 2017-10-02 NOTE — Patient Instructions (Signed)

## 2017-10-03 ENCOUNTER — Other Ambulatory Visit: Payer: Self-pay | Admitting: Family Medicine

## 2017-10-03 LAB — T4, FREE: FREE T4: 0.99 ng/dL (ref 0.82–1.77)

## 2017-10-03 LAB — TSH: TSH: 1.31 u[IU]/mL (ref 0.450–4.500)

## 2017-10-03 LAB — VITAMIN D 25 HYDROXY (VIT D DEFICIENCY, FRACTURES): Vit D, 25-Hydroxy: 17.9 ng/mL — ABNORMAL LOW (ref 30.0–100.0)

## 2017-10-03 MED ORDER — ERGOCALCIFEROL 1.25 MG (50000 UT) PO CAPS: 50000.0000 [IU] | ORAL_CAPSULE | ORAL | 1 refills | Status: DC

## 2017-10-03 MED FILL — VIT D2 1.25 MG (50,000 UNIT: 1.25 MG | 28 days supply | Qty: 4 | Fill #0

## 2017-10-04 MED FILL — ?CETIRIZINE HCL 10 MG TABLE: 10 | 30 days supply | Qty: 30 | Fill #0

## 2017-10-13 ENCOUNTER — Telehealth: Payer: Self-pay

## 2017-10-13 NOTE — Telephone Encounter (Signed)
Patient was called and informed of lab results via interpretor([id number]). 

## 2017-10-31 MED FILL — SERTRALINE HCL 100 MG TAB: 100 | 30 days supply | Qty: 30 | Fill #3

## 2017-11-10 MED FILL — VIT D2 1.25 MG (50,000 UNIT: 1.25 MG | 28 days supply | Qty: 4 | Fill #1

## 2017-11-23 ENCOUNTER — Ambulatory Visit: Payer: Self-pay | Attending: Family Medicine

## 2017-11-29 MED FILL — VIT D2 1.25 MG (50,000 UNIT: 1.25 MG | 28 days supply | Qty: 4 | Fill #2

## 2017-12-01 ENCOUNTER — Ambulatory Visit: Payer: Self-pay | Attending: Nurse Practitioner | Admitting: Nurse Practitioner

## 2017-12-01 ENCOUNTER — Ambulatory Visit: Payer: Self-pay | Admitting: Nurse Practitioner

## 2017-12-01 ENCOUNTER — Encounter: Payer: Self-pay | Admitting: Nurse Practitioner

## 2017-12-01 VITALS — BP 122/85 | HR 102 | Temp 99.0°F | Ht 65.0 in | Wt 186.4 lb

## 2017-12-01 DIAGNOSIS — R51 Headache: Secondary | ICD-10-CM | POA: Insufficient documentation

## 2017-12-01 DIAGNOSIS — R0981 Nasal congestion: Secondary | ICD-10-CM | POA: Insufficient documentation

## 2017-12-01 DIAGNOSIS — F329 Major depressive disorder, single episode, unspecified: Secondary | ICD-10-CM

## 2017-12-01 DIAGNOSIS — Z79899 Other long term (current) drug therapy: Secondary | ICD-10-CM | POA: Insufficient documentation

## 2017-12-01 DIAGNOSIS — J329 Chronic sinusitis, unspecified: Secondary | ICD-10-CM

## 2017-12-01 DIAGNOSIS — F419 Anxiety disorder, unspecified: Secondary | ICD-10-CM

## 2017-12-01 DIAGNOSIS — Z760 Encounter for issue of repeat prescription: Secondary | ICD-10-CM | POA: Insufficient documentation

## 2017-12-01 MED ORDER — AZELASTINE-FLUTICASONE 137-50 MCG/ACT NA SUSP: NASAL | 1 refills | Status: DC

## 2017-12-01 MED ORDER — HYDROXYZINE HCL 10 MG PO TABS: 10.0000 mg | ORAL_TABLET | Freq: Three times a day (TID) | ORAL | 6 refills | Status: DC | PRN

## 2017-12-01 MED ORDER — IBUPROFEN 800 MG PO TABS: 800.0000 mg | ORAL_TABLET | Freq: Three times a day (TID) | ORAL | 0 refills | Status: AC | PRN

## 2017-12-01 MED ORDER — SERTRALINE HCL 100 MG PO TABS: 100.0000 mg | ORAL_TABLET | Freq: Every day | ORAL | 6 refills | Status: DC

## 2017-12-01 MED FILL — hydrOXYzine HCL 10 MG TABS: 10 | 30 days supply | Qty: 90 | Fill #1

## 2017-12-01 MED FILL — SERTRALINE HCL 100 MG TAB: 100 | 30 days supply | Qty: 30 | Fill #0

## 2017-12-01 NOTE — Patient Instructions (Signed)
Sinusitis en los adultos  (Sinusitis, Adult)  La sinusitis es la inflamacin y el dolor en los senos paranasales. Los senos paranasales son espacios vacos en los huesos alrededor del rostro. Estos se encuentran en estos lugares:   Alrededor de los ojos.   En la mitad de la frente.   Detrs de la nariz.   En los pmulos.  Los senos y las fosas nasales estn cubiertos de un lquido fibroso (mucosidad). Normalmente, la mucosidad drena a travs de los senos. Cuando los tejidos nasales se inflaman o hinchan, la mucosidad puede quedar atrapada o bloqueada, por lo que el aire no puede fluir por los senos paranasales. Esto permite que se desarrollen bacterias, virus y hongos, lo que produce infecciones.  CUIDADOS EN EL HOGAR  Medicamentos   Tome, use o aplique los medicamentos de venta libre y los recetados solamente como se lo haya indicado el mdico. Estos pueden incluir aerosoles nasales.   Si le recetaron un antibitico, tmelo como se lo haya indicado el mdico. No deje de tomar los antibiticos aunque comience a sentirse mejor.  Hidrtese y humidifique los ambientes   Beba suficiente agua para mantener la orina clara o de color amarillo plido.   Use un humidificador de vapor fro para mantener la humedad de su hogar por encima del 50%.   Inhale vapor durante 10a 15minutos, de 3a 4veces al da o como se lo haya indicado el mdico. Puede hacer esto en el bao con el vapor del agua caliente de la ducha.   Trate de no exponerse al aire fro o seco.  Reposo   Descanse todo lo que pueda.   Duerma con la cabeza elevada.   Asegrese de dormir lo suficiente cada noche.  Instrucciones generales   Pngase un pao caliente y hmedo en el rostro 3o 4veces al da, o como se lo haya indicado su mdico. Esto ayuda a calmar las molestias.   Lave sus manos frecuentemente con agua y jabn. Use un desinfectante para manos si no dispone de agua y jabn.   No fume. Evite estar cerca de personas que fuman (fumador  pasivo).   Concurra a todas las visitas de control como se lo haya indicado el mdico. Esto es importante.  SOLICITE AYUDA SI:   Tiene fiebre.   Los sntomas empeoran.   Los sntomas no mejoran en el perodo de 10das.  SOLICITE AYUDA DE INMEDIATO SI:   Siente un dolor de cabeza muy intenso.   No puede dejar de vomitar.   Tiene dolor o hinchazn en la zona del rostro o los ojos.   Tiene dificultad para ver.   Se siente confundido.   Tiene el cuello rgido.   Tiene dificultad para respirar.  Esta informacin no tiene como fin reemplazar el consejo del mdico. Asegrese de hacerle al mdico cualquier pregunta que tenga.  Document Released: 11/30/2011 Document Revised: 06/29/2015 Document Reviewed: 12/31/2014  [employer] Interactive Patient Education  2018 [employer]

## 2017-12-01 NOTE — Progress Notes (Signed)
Assessment & Plan:  Tracie [name] was seen today for medication refill.  Diagnoses and all orders for this visit:  Anxiety and depression -     sertraline (ZOLOFT) 100 MG tablet; Take 1 tablet (100 mg total) by mouth daily. -     hydrOXYzine (ATARAX/VISTARIL) 10 MG tablet; Take 1 tablet (10 mg total) by mouth 3 (three) times daily as needed.  Chronic congestion of paranasal sinus -     Azelastine-Fluticasone 137-50 MCG/ACT SUSP; Apply 2 sprays per nostril daily. -     ibuprofen (ADVIL,MOTRIN) 800 MG tablet; Take 1 tablet (800 mg total) by mouth every 8 (eight) hours as needed. SHe was instructed to continue zyrtec as well and if symptoms persist past one week to call the office for further recommendatoins.   Patient has been counseled on age-appropriate routine health concerns for screening and prevention. These are reviewed and up-to-date. Referrals have been placed accordingly. Immunizations are up-to-date or declined.    Subjective:   Chief Complaint  Patient presents with  . Medication Refill    Pt. is here requesting for her Zoloft refill.    HPI Tracie [name] 37 y.o. female presents to office today for medication refill and with complaints of nasal congestion and headache unresponsive to flonase and zyrtec.   Anxiety and Depression  Chronic and well controlled. She endorses medication compliance taking hydroxyzine 10 mg TID prn and zoloft 100 mg daily.  Depression screen PHQ 2/9 12/01/2017  Decreased Interest 2  Down, Depressed, Hopeless 2  PHQ - 2 Score 4  Altered sleeping 2  Tired, decreased energy 2  Change in appetite 2  Feeling bad or failure about yourself  2  Trouble concentrating 2  Moving slowly or fidgety/restless 2  Suicidal thoughts 0  PHQ-9 Score 16  Some recent data might be hidden   Sinus Pain Patient complains of achiness, congestion, facial pain and headache described as frontal pressure. She reports her current symptoms usually occur about   4 times  per year.  Onset of symptoms was a few days ago, gradually worsening since that time. She is drinking plenty of fluids.  There is  no history of pneumonia or bronchitisPatient is a non-smoker.   Review of Systems  Constitutional: Negative for fever, malaise/fatigue and weight loss.  HENT: Positive for congestion and sinus pain. Negative for nosebleeds.   Eyes: Negative.  Negative for blurred vision, double vision and photophobia.  Respiratory: Negative.  Negative for cough and shortness of breath.   Cardiovascular: Negative.  Negative for chest pain, palpitations and leg swelling.  Gastrointestinal: Negative.  Negative for heartburn, nausea and vomiting.  Musculoskeletal: Negative.  Negative for myalgias.  Neurological: Positive for headaches. Negative for dizziness, focal weakness and seizures.  Psychiatric/Behavioral: Negative.  Negative for suicidal ideas.    Past Medical History:  Diagnosis Date  . Headache     History reviewed. No pertinent surgical history.  History reviewed. No pertinent family history.  Social History Reviewed with no changes to be made today.   Outpatient Medications Prior to Visit  Medication Sig Dispense Refill  . cetirizine (ZYRTEC) 10 MG tablet Take 1 tablet (10 mg total) by mouth daily. 30 tablet 1  . cyclobenzaprine (FLEXERIL) 10 MG tablet Take 1 tablet (10 mg total) by mouth 3 (three) times daily as needed for muscle spasms. 60 tablet 1  . ergocalciferol (DRISDOL) 50000 units capsule Take 1 capsule (50,000 Units total) by mouth once a week. 9 capsule 1  . hydrOXYzine (  ATARAX/VISTARIL) 10 MG tablet Take 1 tablet (10 mg total) by mouth 3 (three) times daily as needed. 90 tablet 6  . naproxen (NAPROSYN) 500 MG tablet Take 1 tablet (500 mg total) by mouth 2 (two) times daily with a meal. X 10 days then prn pain/inflammation 60 tablet 1  . Phenylephrine-Acetaminophen (VICKS DAYQUIL SINEX) 5-325 MG CAPS Take by mouth.    . sertraline (ZOLOFT) 100 MG tablet  Take 1 tablet (100 mg total) by mouth daily. 30 tablet 6  . medroxyPROGESTERone (PROVERA) 10 MG tablet Take 1 tablet (10 mg total) by mouth daily. (Patient not taking: Reported on 12/01/2017) 10 tablet 0  . Selenium Sulfide 2.25 % SHAM Apply 1 application topically 2 (two) times a week. (Patient not taking: Reported on 08/29/2017) 180 mL 0  . terbinafine (LAMISIL) 250 MG tablet Take 1 tablet (250 mg total) by mouth daily. (Patient not taking: Reported on 08/29/2017) 14 tablet 0   No facility-administered medications prior to visit.     No Known Allergies     Objective:    BP 122/85 (BP Location: Right Arm, Patient Position: Sitting, Cuff Size: Large)   Pulse (!) 102   Temp 99 F (37.2 C) (Oral)   Ht 5\' 5"  (1.651 m)   Wt 186 lb 6.4 oz (84.6 kg)   LMP 11/21/2017   SpO2 98%   BMI 31.02 kg/m  Wt Readings from Last 3 Encounters:  12/01/17 186 lb 6.4 oz (84.6 kg)  10/02/17 186 lb (84.4 kg)  08/29/17 186 lb (84.4 kg)    Physical Exam  Constitutional: She is oriented to person, place, and time. She appears well-developed and well-nourished. She is cooperative.  HENT:  Head: Normocephalic and atraumatic.  Right Ear: Hearing, external ear and ear canal normal. A middle ear effusion is present.  Left Ear: Hearing, external ear and ear canal normal. A middle ear effusion is present.  Nose: Mucosal edema and rhinorrhea present. Right sinus exhibits frontal sinus tenderness. Left sinus exhibits frontal sinus tenderness.  Mouth/Throat: Uvula is midline and oropharynx is clear and moist.  Eyes: EOM are normal.  Neck: Normal range of motion.  Cardiovascular: Regular rhythm and normal heart sounds. Tachycardia present. Exam reveals no gallop and no friction rub.  No murmur heard. Pulmonary/Chest: Effort normal and breath sounds normal. No tachypnea. No respiratory distress. She has no decreased breath sounds. She has no wheezes. She has no rhonchi. She has no rales. She exhibits no tenderness.    Abdominal: Soft. Bowel sounds are normal.  Musculoskeletal: Normal range of motion. She exhibits no edema.  Neurological: She is alert and oriented to person, place, and time. Coordination normal.  Skin: Skin is warm and dry.  Psychiatric: She has a normal mood and affect. Her behavior is normal. Judgment and thought content normal.  Nursing note and vitals reviewed.      Patient has been counseled extensively about nutrition and exercise as well as the importance of adherence with medications and regular follow-up. The patient was given clear instructions to go to ER or return to medical center if symptoms don't improve, worsen or new problems develop. The patient verbalized understanding.   Follow-up: Return if symptoms worsen or fail to improve.   Claiborne Rigg, FNP-BC [facility] [place], [place] [phone]   12/02/2017, 12:06 AM

## 2017-12-02 ENCOUNTER — Encounter: Payer: Self-pay | Admitting: Nurse Practitioner

## 2017-12-04 ENCOUNTER — Other Ambulatory Visit: Payer: Self-pay

## 2017-12-04 DIAGNOSIS — J329 Chronic sinusitis, unspecified: Secondary | ICD-10-CM

## 2017-12-04 MED ORDER — FLUTICASONE PROPIONATE 50 MCG/ACT NA SUSP: 2.0000 | Freq: Every day | NASAL | 1 refills | Status: DC

## 2017-12-04 MED ORDER — AZELASTINE HCL 137 MCG/SPRAY NA SOLN: 2.0000 | Freq: Every day | NASAL | 1 refills | Status: DC

## 2017-12-04 MED FILL — FLUTICASONE PROP 50 MCG SPR: 50 | 30 days supply | Qty: 16 | Fill #0

## 2017-12-04 MED FILL — AZELASTINE HCL 137 MCG SPRY: 0.1 | 30 days supply | Qty: 30 | Fill #0

## 2017-12-04 MED FILL — IBUPROFEN 800 MG TABLET: 800 | 20 days supply | Qty: 60 | Fill #0

## 2017-12-04 NOTE — Telephone Encounter (Signed)
Pharmacy requests a switch from Dymista (AZELASTINE-FLUTICASONE 137-50)  To the seperate components Azelastine 137mcg  And Fluticasone 50mcg for cost reasons. Switch was ok'd by Ms. Meredeth IdeFleming.

## 2017-12-05 ENCOUNTER — Telehealth: Payer: Self-pay | Admitting: Family Medicine

## 2017-12-05 NOTE — Telephone Encounter (Signed)
Pt called to request an update on her CAFA application, please follow up when you get a chance

## 2017-12-11 ENCOUNTER — Telehealth: Payer: Self-pay | Admitting: Family Medicine

## 2017-12-11 DIAGNOSIS — J310 Chronic rhinitis: Secondary | ICD-10-CM

## 2017-12-11 NOTE — Telephone Encounter (Signed)
Patient has oc, she would like referral to a otolaryngologist, please follow up.

## 2017-12-12 NOTE — Telephone Encounter (Signed)
What is the indication for referral 

## 2017-12-12 NOTE — Telephone Encounter (Signed)
Will route to PCP for review. 

## 2017-12-14 ENCOUNTER — Telehealth: Payer: Self-pay | Admitting: Family Medicine

## 2017-12-14 NOTE — Telephone Encounter (Signed)
Pt came in to request a referral to a specialist due to her facial problem, she now has CAFA and the OC,please follow up

## 2017-12-14 NOTE — Telephone Encounter (Signed)
Patient was called via interpretor and patient states that she is having inflammation in her nose and pressure along with dizziness.

## 2017-12-14 NOTE — Telephone Encounter (Signed)
Referral has been placed. 

## 2017-12-19 ENCOUNTER — Other Ambulatory Visit: Payer: Self-pay

## 2017-12-19 ENCOUNTER — Ambulatory Visit: Payer: Self-pay | Admitting: Family Medicine

## 2017-12-19 MED ORDER — HYDROCORTISONE ACE-PRAMOXINE 2.5-1 % RE CREA: 1.0000 "application " | TOPICAL_CREAM | Freq: Three times a day (TID) | RECTAL | 0 refills | Status: DC

## 2017-12-19 MED FILL — HYDROCORT-PRAMOXINE 2.5-1%: 2.5-1 | 15 days supply | Qty: 30 | Fill #0

## 2017-12-25 ENCOUNTER — Ambulatory Visit: Payer: Self-pay | Admitting: Family Medicine

## 2017-12-26 ENCOUNTER — Other Ambulatory Visit: Payer: Self-pay

## 2017-12-26 ENCOUNTER — Encounter: Payer: Self-pay | Admitting: Physician Assistant

## 2017-12-26 ENCOUNTER — Ambulatory Visit: Payer: Self-pay | Attending: Family Medicine | Admitting: Physician Assistant

## 2017-12-26 VITALS — BP 129/85 | HR 88 | Temp 98.2°F | Resp 18 | Ht 63.0 in | Wt 189.6 lb

## 2017-12-26 DIAGNOSIS — J029 Acute pharyngitis, unspecified: Secondary | ICD-10-CM | POA: Insufficient documentation

## 2017-12-26 DIAGNOSIS — F419 Anxiety disorder, unspecified: Secondary | ICD-10-CM | POA: Insufficient documentation

## 2017-12-26 DIAGNOSIS — Z23 Encounter for immunization: Secondary | ICD-10-CM

## 2017-12-26 DIAGNOSIS — J019 Acute sinusitis, unspecified: Secondary | ICD-10-CM | POA: Insufficient documentation

## 2017-12-26 DIAGNOSIS — F329 Major depressive disorder, single episode, unspecified: Secondary | ICD-10-CM | POA: Insufficient documentation

## 2017-12-26 DIAGNOSIS — K625 Hemorrhage of anus and rectum: Secondary | ICD-10-CM | POA: Insufficient documentation

## 2017-12-26 DIAGNOSIS — R42 Dizziness and giddiness: Secondary | ICD-10-CM | POA: Insufficient documentation

## 2017-12-26 DIAGNOSIS — R51 Headache: Secondary | ICD-10-CM

## 2017-12-26 DIAGNOSIS — Z79899 Other long term (current) drug therapy: Secondary | ICD-10-CM | POA: Insufficient documentation

## 2017-12-26 DIAGNOSIS — B9789 Other viral agents as the cause of diseases classified elsewhere: Secondary | ICD-10-CM | POA: Insufficient documentation

## 2017-12-26 DIAGNOSIS — R519 Headache, unspecified: Secondary | ICD-10-CM

## 2017-12-26 MED ORDER — PREDNISONE 20 MG PO TABS: 20.0000 mg | ORAL_TABLET | Freq: Every day | ORAL | 0 refills | Status: DC

## 2017-12-26 MED ORDER — HYDROCORTISONE 2.5 % RE CREA: 1.0000 "application " | TOPICAL_CREAM | Freq: Two times a day (BID) | RECTAL | 0 refills | Status: DC

## 2017-12-26 MED FILL — predniSONE 20 MG TABS: 20 | 5 days supply | Qty: 5 | Fill #0

## 2017-12-26 NOTE — Patient Instructions (Addendum)
Buy some Tucks pads and use on your bottom twice daily  Keep appt with ENT

## 2017-12-26 NOTE — Progress Notes (Signed)
Chief Complaint: Facial pain Rectal bleeding  Subjective: This is a 37 year old female with a history of anxiety mixed with depression and chronic sinus pain who is here for an acute visit.  She has 2 complaints.  The first discontinued facial soreness.  She locates her problem to the ethmoidal and frontal sinus area.  Not necessarily any purulent drainage but often painful.  Feels like everything is clogged up and swollen.  Her ears are causing her some discomfort and sometimes she has some dizziness and sore throat.  The second thing is rectal bleeding.  This started on Thursday.  She has bowel movements twice daily and never is constipated.  She did 2 years ago have hemorrhoids.  This is bright red blood.  No evidence of melena.  No evidence of bleeding anywhere else.  She can see specks in the stool and also when she wipes.   ROS:  GEN: denies fever or chills, denies change in weight Skin: denies lesions or rashes HEENT: + headache, +earache, no epistaxis, +sore throat, no neck pain LUNGS: denies SHOB, dyspnea, PND, orthopnea CV: denies CP or palpitations EXT: denies muscle spasms or swelling; no pain in lower ext, no weakness NEURO: denies numbness or tingling, denies sz, stroke or TIA   Objective:  Vitals:   12/26/17 1344  BP: 129/85  Pulse: 88  Resp: 18  Temp: 98.2 F (36.8 C)  TempSrc: Oral  SpO2: 100%  Weight: 189 lb 9.6 oz (86 kg)  Height: 5\' 3"  (1.6 m)    Physical Exam:  General: in no acute distress. HEENT: thick, edematous pale mucosa in nose; cerumen bilat ears Heart: Normal  s1 &s2  Regular rate and rhythm, without murmurs, rubs, gallops. Lungs: Clear to auscultation bilaterally. Neuro: Alert, awake, oriented x3, nonfocal.   Medications: Prior to Admission medications   Medication Sig Start Date End Date Taking? Authorizing Provider  Azelastine HCl 137 MCG/SPRAY SOLN Place 2 sprays into the nose daily. 12/04/17  Yes Claiborne Rigg, NP  cetirizine  (ZYRTEC) 10 MG tablet Take 1 tablet (10 mg total) by mouth daily. 10/02/17  Yes Hoy Register, MD  cyclobenzaprine (FLEXERIL) 10 MG tablet Take 1 tablet (10 mg total) by mouth 3 (three) times daily as needed for muscle spasms. 10/02/17  Yes Hoy Register, MD  ergocalciferol (DRISDOL) 50000 units capsule Take 1 capsule (50,000 Units total) by mouth once a week. 10/03/17  Yes Newlin, Odette Horns, MD  fluticasone (FLONASE) 50 MCG/ACT nasal spray Place 2 sprays into both nostrils daily. 12/04/17  Yes Claiborne Rigg, NP  hydrocortisone-pramoxine [facility]) 2.5-1 % rectal cream Place 1 application rectally 3 (three) times daily. 12/19/17  Yes Hoy Register, MD  hydrOXYzine (ATARAX/VISTARIL) 10 MG tablet Take 1 tablet (10 mg total) by mouth 3 (three) times daily as needed. 12/01/17  Yes Claiborne Rigg, NP  ibuprofen (ADVIL,MOTRIN) 800 MG tablet Take 1 tablet (800 mg total) by mouth every 8 (eight) hours as needed. 12/01/17 12/31/17 Yes Claiborne Rigg, NP  medroxyPROGESTERone (PROVERA) 10 MG tablet Take 1 tablet (10 mg total) by mouth daily. 08/29/17  Yes Hoy Register, MD  Selenium Sulfide 2.25 % SHAM Apply 1 application topically 2 (two) times a week. 07/13/17  Yes Hoy Register, MD  sertraline (ZOLOFT) 100 MG tablet Take 1 tablet (100 mg total) by mouth daily. 12/01/17  Yes Claiborne Rigg, NP  terbinafine (LAMISIL) 250 MG tablet Take 1 tablet (250 mg total) by mouth daily. 07/13/17  Yes Hoy Register, MD  hydrocortisone (  ANUSOL-HC) 2.5 % rectal cream Place 1 application rectally 2 (two) times daily. 12/26/17   Vivianne Master, PA-C  predniSONE (DELTASONE) 20 MG tablet Take 1 tablet (20 mg total) by mouth daily with breakfast. 12/26/17   Vivianne Master, PA-C  PARoxetine (PAXIL) 20 MG tablet Take 1 tablet (20 mg total) by mouth daily. Patient not taking: Reported on 01/12/2015 09/18/14 01/29/15  Quentin Angst, MD  SUMAtriptan (IMITREX) 50 MG tablet Take 1 tablet (50 mg total) by mouth every 2  (two) hours as needed for migraine or headache. May repeat in 2 hours if headache persists or recurs. Patient not taking: Reported on 06/12/2014 05/17/14 01/29/15  Reuben Likes, MD  traZODone (DESYREL) 50 MG tablet Take 1 tablet (50 mg total) by mouth at bedtime. Patient not taking: Reported on 08/21/2014 06/12/14 01/29/15  Quentin Angst, MD    Assessment: 1. Facial pain, acute on chronic viral sinusitis 2. Rectal bleeding, likely hemorrhoidal   Plan: Prednisone 20 mg for 5 days then stop Cont with conservative sprays/Zyrtec Keep appt with ENT  Tucks pads  Anusol Cream  Follow up:3 mo  The patient was given clear instructions to go to ER or return to medical center if symptoms don't improve, worsen or new problems develop. The patient verbalized understanding. The patient was told to call to get lab results if they haven't heard anything in the next week.   This note has been created with Education officer, environmental. Any transcriptional errors are unintentional.   Scot Jun, PA-C 12/26/2017, 2:00 PM

## 2017-12-28 MED FILL — PROCTOZONE-HC 2.5 % CREA: 2.5 | 30 days supply | Qty: 30 | Fill #0

## 2018-01-01 ENCOUNTER — Other Ambulatory Visit: Payer: Self-pay

## 2018-01-04 MED FILL — SERTRALINE HCL 100 MG TAB: 100 | 30 days supply | Qty: 30 | Fill #1

## 2018-01-05 LAB — CYTOLOGY - PAP: DIAGNOSIS: NEGATIVE

## 2018-01-22 ENCOUNTER — Ambulatory Visit (INDEPENDENT_AMBULATORY_CARE_PROVIDER_SITE_OTHER): Payer: Self-pay | Admitting: Otolaryngology

## 2018-01-22 DIAGNOSIS — J343 Hypertrophy of nasal turbinates: Secondary | ICD-10-CM

## 2018-01-22 DIAGNOSIS — J342 Deviated nasal septum: Secondary | ICD-10-CM

## 2018-01-22 DIAGNOSIS — J310 Chronic rhinitis: Secondary | ICD-10-CM

## 2018-01-24 ENCOUNTER — Other Ambulatory Visit (INDEPENDENT_AMBULATORY_CARE_PROVIDER_SITE_OTHER): Payer: Self-pay | Admitting: Otolaryngology

## 2018-01-24 DIAGNOSIS — J320 Chronic maxillary sinusitis: Secondary | ICD-10-CM

## 2018-01-31 ENCOUNTER — Encounter (HOSPITAL_COMMUNITY): Payer: Self-pay

## 2018-01-31 ENCOUNTER — Ambulatory Visit (HOSPITAL_COMMUNITY)
Admission: RE | Admit: 2018-01-31 | Discharge: 2018-01-31 | Disposition: A | Discharge status: Home / Self Care | Payer: Self-pay | Source: Ambulatory Visit | Attending: Otolaryngology | Admitting: Otolaryngology

## 2018-01-31 DIAGNOSIS — J320 Chronic maxillary sinusitis: Secondary | ICD-10-CM

## 2018-02-19 ENCOUNTER — Ambulatory Visit (INDEPENDENT_AMBULATORY_CARE_PROVIDER_SITE_OTHER): Payer: Self-pay | Admitting: Otolaryngology

## 2018-02-26 ENCOUNTER — Ambulatory Visit (INDEPENDENT_AMBULATORY_CARE_PROVIDER_SITE_OTHER): Payer: Self-pay | Admitting: Otolaryngology

## 2018-03-07 MED FILL — SERTRALINE HCL 100 MG TAB: 100 | 30 days supply | Qty: 30 | Fill #2

## 2018-04-06 ENCOUNTER — Other Ambulatory Visit: Payer: Self-pay | Admitting: Family Medicine

## 2018-04-06 DIAGNOSIS — R0602 Shortness of breath: Secondary | ICD-10-CM

## 2018-05-09 ENCOUNTER — Ambulatory Visit: Payer: Self-pay | Attending: Family Medicine | Admitting: Physician Assistant

## 2018-05-09 VITALS — BP 110/72 | HR 82 | Temp 99.4°F | Resp 18 | Ht 65.0 in | Wt 184.0 lb

## 2018-05-09 DIAGNOSIS — N3941 Urge incontinence: Secondary | ICD-10-CM

## 2018-05-09 DIAGNOSIS — Z789 Other specified health status: Secondary | ICD-10-CM

## 2018-05-09 DIAGNOSIS — N3943 Post-void dribbling: Secondary | ICD-10-CM

## 2018-05-09 DIAGNOSIS — R1084 Generalized abdominal pain: Secondary | ICD-10-CM

## 2018-05-09 LAB — POCT URINALYSIS DIP (CLINITEK)
BILIRUBIN UA: NEGATIVE
BILIRUBIN UA: NEGATIVE mg/dL
GLUCOSE UA: NEGATIVE mg/dL
Leukocytes, UA: NEGATIVE
NITRITE UA: NEGATIVE
POC PROTEIN,UA: NEGATIVE
RBC UA: NEGATIVE
Spec Grav, UA: 1.015 (ref 1.010–1.025)
Urobilinogen, UA: 0.2 E.U./dL
pH, UA: 5.5 (ref 5.0–8.0)

## 2018-05-09 LAB — POCT URINE PREGNANCY: Preg Test, Ur: NEGATIVE

## 2018-05-09 MED FILL — SERTRALINE HCL 100 MG TAB: 100 | 30 days supply | Qty: 30 | Fill #3

## 2018-05-09 NOTE — Progress Notes (Signed)
Patient ID: Tracie [name], female   DOB: March 14, 1981, 38 y.o.   MRN: [id number]      Tracie [name], is a 38 y.o. female  FHQ:[id number]  QDI:[id number]  DOB - September 22, 1980  Subjective:  Chief Complaint and HPI: Tracie [name] is a 38 y.o. female here today-Angel with [employer] translating Lower abdominal and lower back pain more on the R than the L.  Pain has been for about 3 months.  Pain worse when laying down.  Describes pain as similar to period cramps.  Sometimes post void dribbling.  Also has some urge incontinence.  No vaginal discharge.  No dysuria.  No f/c.  Appetite is good.  No changes in BM  LMP:  04/24/2018. +condoms for BC.   ROS:   Constitutional:  No f/c, No night sweats, No unexplained weight loss. EENT:  No vision changes, No blurry vision, No hearing changes. No mouth, throat, or ear problems.  Respiratory: No cough, No SOB Cardiac: No CP, no palpitations GI:  +lower abd pain abd pain, No N/V/D. GU: see above Musculoskeletal: No joint pain Neuro: No headache, no dizziness, no motor weakness.  Skin: No rash Endocrine:  No polydipsia. No polyuria.  Psych: Denies SI/HI  No problems updated.  ALLERGIES: No Known Allergies  PAST MEDICAL HISTORY: Past Medical History:  Diagnosis Date  . Headache     MEDICATIONS AT HOME: Prior to Admission medications   Medication Sig Start Date End Date Taking? Authorizing Provider  Azelastine HCl 137 MCG/SPRAY SOLN Place 2 sprays into the nose daily. 12/04/17   Claiborne Rigg, NP  cetirizine (ZYRTEC) 10 MG tablet Take 1 tablet (10 mg total) by mouth daily. 10/02/17   Hoy Register, MD  cyclobenzaprine (FLEXERIL) 10 MG tablet Take 1 tablet (10 mg total) by mouth 3 (three) times daily as needed for muscle spasms. 10/02/17   Hoy Register, MD  ergocalciferol (DRISDOL) 50000 units capsule Take 1 capsule (50,000 Units total) by mouth once a week. 10/03/17   Hoy Register, MD  fluticasone (FLONASE)  50 MCG/ACT nasal spray Place 2 sprays into both nostrils daily. 12/04/17   Claiborne Rigg, NP  hydrocortisone (ANUSOL-HC) 2.5 % rectal cream Place 1 application rectally 2 (two) times daily. 12/26/17   Vivianne Master, PA-C  hydrocortisone-pramoxine [facility]) 2.5-1 % rectal cream Place 1 application rectally 3 (three) times daily. 12/19/17   Hoy Register, MD  hydrOXYzine (ATARAX/VISTARIL) 10 MG tablet Take 1 tablet (10 mg total) by mouth 3 (three) times daily as needed. 12/01/17   Claiborne Rigg, NP  Selenium Sulfide 2.25 % SHAM Apply 1 application topically 2 (two) times a week. 07/13/17   Hoy Register, MD  sertraline (ZOLOFT) 100 MG tablet Take 1 tablet (100 mg total) by mouth daily. 12/01/17   Claiborne Rigg, NP     Objective:  EXAM:   Vitals:   05/09/18 1533  BP: 110/72  Pulse: 82  Resp: 18  Temp: 99.4 F (37.4 C)  TempSrc: Oral  SpO2: 100%  Weight: 184 lb (83.5 kg)  Height: 5\' 5"  (1.651 m)  HC: 18" (45.7 cm)    General appearance : A&OX3. NAD. Non-toxic-appearing HEENT: Atraumatic and Normocephalic.  PERRLA. EOM intact.  Neck: supple, no JVD. No cervical lymphadenopathy. No thyromegaly Chest/Lungs:  Breathing-non-labored, Good air entry bilaterally, breath sounds normal without rales, rhonchi, or wheezing  CVS: S1 S2 regular, no murmurs, gallops, rubs  Abdomen: Bowel sounds present, Non tender and not distended with no gaurding, rigidity or rebound.  Extremities: Bilateral Lower Ext shows no edema, both legs are warm to touch with = pulse throughout Back-full S&ROM DTR=intact B.  Neg SLR.   Neurology:  CN II-XII grossly intact, Non focal.   Psych:  TP linear. J/I WNL. Normal speech. Appropriate eye contact and affect.  Skin:  No Rash  Data Review Lab Results  Component Value Date   HGBA1C 5.6 10/23/2015   HGBA1C 5.5 09/09/2013     Assessment & Plan   1. Generalized abdominal pain No red flags/non-acute abdomen - POCT URINALYSIS DIP (CLINITEK) - POCT  urine pregnancy - DG Abd 1 View; Future - Comprehensive metabolic panel  2. Urge incontinence - Urine cytology ancillary only - Ambulatory referral to Urology  3. Post-void dribbling - Ambulatory referral to Urology  4. Language barrier [employer] interpreters used and additional time performing visit was required.   Patient have been counseled extensively about nutrition and exercise  Return in about 1 month (around 06/07/2018) for Dr Newlin-recheck abdominal pain.  The patient was given clear instructions to go to ER or return to medical center if symptoms don't improve, worsen or new problems develop. The patient verbalized understanding. The patient was told to call to get lab results if they haven't heard anything in the next week.     Georgian Co, PA-C [facility] [place], [place] [phone]   05/09/2018, 3:45 PM

## 2018-05-09 NOTE — Patient Instructions (Addendum)
Saline nasal spray for nasal dryness vaseline into nose at night

## 2018-05-10 LAB — COMPREHENSIVE METABOLIC PANEL
A/G RATIO: 1.8 (ref 1.2–2.2)
ALK PHOS: 64 IU/L (ref 39–117)
ALT: 25 IU/L (ref 0–32)
AST: 15 IU/L (ref 0–40)
Albumin: 4.6 g/dL (ref 3.8–4.8)
BUN/Creatinine Ratio: 14 (ref 9–23)
BUN: 8 mg/dL (ref 6–20)
Bilirubin Total: <0.2 mg/dL (ref 0.0–1.2)
CO2: 21 mmol/L (ref 20–29)
Calcium: 9.9 mg/dL (ref 8.7–10.2)
Chloride: 99 mmol/L (ref 96–106)
Creatinine, Ser: 0.56 mg/dL — ABNORMAL LOW (ref 0.57–1.00)
GFR calc Af Amer: 139 mL/min/{1.73_m2} (ref >59)
GFR calc non Af Amer: 120 mL/min/{1.73_m2} (ref >59)
Globulin, Total: 2.6 g/dL (ref 1.5–4.5)
Glucose: 81 mg/dL (ref 65–99)
POTASSIUM: 4.3 mmol/L (ref 3.5–5.2)
SODIUM: 136 mmol/L (ref 134–144)
Total Protein: 7.2 g/dL (ref 6.0–8.5)

## 2018-05-11 LAB — URINE CYTOLOGY ANCILLARY ONLY
BACTERIAL VAGINITIS: NEGATIVE
CANDIDA VAGINITIS: NEGATIVE
Chlamydia: NEGATIVE
Neisseria Gonorrhea: NEGATIVE
TRICH (WINDOWPATH): NEGATIVE

## 2018-05-24 ENCOUNTER — Telehealth: Payer: Self-pay | Admitting: *Deleted

## 2018-05-24 NOTE — Telephone Encounter (Signed)
Medical Assistant used [employer] to contact patient.  Interpreter Name: Audree Camel #: [id number] Patient verified DOB Patient is aware of labs being normal and no STI concerns being noted on the urine. Patient will follow up as planned.

## 2018-05-24 NOTE — Telephone Encounter (Signed)
-----   Message from Anders Simmonds, [place] sent at 05/12/2018  6:51 PM EST ----- Please call patient.  Her blood sugar, kidney function, liver function, and electrolytes.  Urine was negative for STD, bacterial vaginosis, and yeast infection.  Follow-up as planned.  Thanks, Georgian Co, PA-C

## 2018-05-28 ENCOUNTER — Ambulatory Visit: Payer: Self-pay

## 2018-05-28 ENCOUNTER — Ambulatory Visit (INDEPENDENT_AMBULATORY_CARE_PROVIDER_SITE_OTHER): Payer: Self-pay | Admitting: Otolaryngology

## 2018-05-28 DIAGNOSIS — J342 Deviated nasal septum: Secondary | ICD-10-CM

## 2018-05-28 DIAGNOSIS — J343 Hypertrophy of nasal turbinates: Secondary | ICD-10-CM

## 2018-05-28 DIAGNOSIS — J310 Chronic rhinitis: Secondary | ICD-10-CM

## 2018-05-29 MED FILL — ?CETIRIZINE HCL 10 MG TABLE: 10 | 30 days supply | Qty: 30 | Fill #1

## 2018-05-30 ENCOUNTER — Ambulatory Visit: Payer: Self-pay

## 2018-07-11 MED FILL — SERTRALINE HCL 100 MG TAB: 100 | 30 days supply | Qty: 30 | Fill #4

## 2018-07-26 ENCOUNTER — Other Ambulatory Visit: Payer: Self-pay

## 2018-07-26 ENCOUNTER — Ambulatory Visit: Payer: Self-pay | Attending: Family Medicine | Admitting: Physician Assistant

## 2018-07-26 DIAGNOSIS — R42 Dizziness and giddiness: Secondary | ICD-10-CM

## 2018-07-26 DIAGNOSIS — R110 Nausea: Secondary | ICD-10-CM

## 2018-07-26 MED ORDER — ONDANSETRON HCL 4 MG PO TABS: 4.0000 mg | ORAL_TABLET | Freq: Three times a day (TID) | ORAL | 0 refills | Status: DC | PRN

## 2018-07-26 MED ORDER — MECLIZINE HCL 25 MG PO TABS: 25.0000 mg | ORAL_TABLET | Freq: Three times a day (TID) | ORAL | 0 refills | Status: DC | PRN

## 2018-07-26 MED FILL — ?MECLIZINE 25MG TAB: 25 | 10 days supply | Qty: 30 | Fill #0

## 2018-07-26 MED FILL — ?ONDANSETRON HCL 4MG TABLET: 4 | 6 days supply | Qty: 20 | Fill #0

## 2018-07-26 NOTE — Progress Notes (Addendum)
Called patient to initiate their telephone visit with provider Georgian Co, PA. Verified date of birth. Patient has c/o dizziness, fatigue, nausea, body aches x 2 weeks. No known sick contact. Has taken Ibuprofen with some relief. KWalker, CMA.

## 2018-07-26 NOTE — Progress Notes (Signed)
Virtual Visit via Telephone Note  I connected with Tracie [name] on 07/26/18 at  2:50 PM EDT by telephone and verified that I am speaking with the correct person using two identifiers.   I discussed the limitations, risks, security and privacy concerns of performing an evaluation and management service by telephone and the availability of in person appointments. I also discussed with the patient that there may be a patient responsible charge related to this service. The patient expressed understanding and agreed to proceed.  Patient location: My Location:  [facility] office Persons on the call:  Myself, the patient, Augustin(interpreter)  History of Present Illness:  Fatigue, nausea, dizziness, HA, body aches X 2 weeks.  No sneezing/runny nose.  No cough.  No fever.  No one in household is sick.  No changes in vision.  No vomiting.  No diarrhea. No ST.  She has had this before.  Symptoms are constant.  Not worse with movement.  No abdominal pain, urinary s/sx, or pelvic pain, vaginal discharge.  No Covid-19 exposures.  No travel.  lmp-07/21/2018  Ibuprofen for discomfort    Observations/Objective:  A&Ox 3.  TP linear.  Speech is clear   Assessment and Plan: 1. Nausea No red flags - ondansetron (ZOFRAN) 4 MG tablet; Take 1 tablet (4 mg total) by mouth every 8 (eight) hours as needed for nausea or vomiting.  Dispense: 20 tablet; Refill: 0  2. Dizziness No red flags - meclizine (ANTIVERT) 25 MG tablet; Take 1 tablet (25 mg total) by mouth 3 (three) times daily as needed for dizziness.  Dispense: 30 tablet; Refill: 0    Follow Up Instructions: If worsens to ED;  Otherwise f/up with PCP prn   I discussed the assessment and treatment plan with the patient. The patient was provided an opportunity to ask questions and all were answered. The patient agreed with the plan and demonstrated an understanding of the instructions.   The patient was advised to call back or seek an in-person  evaluation if the symptoms worsen or if the condition fails to improve as anticipated.  I provided 14 minutes of non-face-to-face time during this encounter.   Georgian Co, PA-C  Patient ID: Tracie [name], female   DOB: [birth year], 38 y.o.   MRN: [id number]

## 2018-08-20 MED FILL — SERTRALINE HCL 100 MG TAB: 100 | 30 days supply | Qty: 30 | Fill #5

## 2018-08-21 ENCOUNTER — Ambulatory Visit: Payer: Self-pay | Admitting: Urology

## 2018-08-27 ENCOUNTER — Telehealth: Payer: Self-pay | Admitting: Family Medicine

## 2018-08-27 NOTE — Telephone Encounter (Signed)
Patients call returned.  Patient identified by name and date of birth.  Patient states that her anxiety is still not under control.  Patient asked how she was taking her hydroxyzine, which she stated that she takes it when she is anxious.  Patient told that the medication needed to be taken as prescribed 3 times a day for the medication to be effective.  Patient explaned that the medicine needed to be taken this way to be effective.    Patient advised to take medicine with meals and if the medication was not effective then to call back and the provider would be informed and adjustments could be made.  Patient acknowledged understanding of advice.

## 2018-08-27 NOTE — Telephone Encounter (Signed)
Nurse called the patient's home phone number but received no answer and message was left on the voicemail for the patient to call back.  Return phone number given. 

## 2018-08-27 NOTE — Telephone Encounter (Signed)
Patient called stating she believes she is now having a reaction to her anxiety medication states she has been feeling tired,sleepy, and crying for no reason. Please follow up  Advised to go to the UC or ED

## 2018-08-29 ENCOUNTER — Ambulatory Visit: Payer: Self-pay

## 2018-08-29 ENCOUNTER — Ambulatory Visit: Payer: Self-pay | Attending: Family Medicine | Admitting: Physician Assistant

## 2018-08-29 ENCOUNTER — Other Ambulatory Visit: Payer: Self-pay

## 2018-08-29 DIAGNOSIS — E559 Vitamin D deficiency, unspecified: Secondary | ICD-10-CM

## 2018-08-29 DIAGNOSIS — R5383 Other fatigue: Secondary | ICD-10-CM

## 2018-08-29 NOTE — Progress Notes (Signed)
Pt states she has been having fatigue and hot flashes   Pt states previously she had low Vit D and rbc

## 2018-08-29 NOTE — Progress Notes (Signed)
Patient ID: Tracie [name], female   DOB: 13-Mar-1981, 38 y.o.   MRN: [id number] Virtual Visit via Telephone Note  I connected with Tracie [name] on 08/29/18 at  9:30 AM EDT by telephone and verified that I am speaking with the correct person using two identifiers.   I discussed the limitations, risks, security and privacy concerns of performing an evaluation and management service by telephone and the availability of in person appointments. I also discussed with the patient that there may be a patient responsible charge related to this service. The patient expressed understanding and agreed to proceed.  Patient location:  home My Location:  [place] office Persons on the call:  Myself, Enrique(the interpreter) and the patient.    History of Present Illness: Several month h/o fatigue and hot flashes(not fever). Feels like she gets more irritable than normal.  No change in appetite.  No N/V/D.  Periods unchanged.  Still taking zoloft.     Observations/Objective:  A&Ox3.     Assessment and Plan: 1. Fatigue, unspecified type Self-care, proper nutrition, and exercise - TSH - CBC with Differential/Platelet  2. Vitamin D deficiency - Vitamin D, 25-hydroxy  Follow Up Instructions: Prn with PCP   I discussed the assessment and treatment plan with the patient. The patient was provided an opportunity to ask questions and all were answered. The patient agreed with the plan and demonstrated an understanding of the instructions.   The patient was advised to call back or seek an in-person evaluation if the symptoms worsen or if the condition fails to improve as anticipated.  I provided 12 minutes of non-face-to-face time during this encounter.   Freeman Caldron, PA-C

## 2018-08-30 ENCOUNTER — Other Ambulatory Visit: Payer: Self-pay | Admitting: Physician Assistant

## 2018-08-30 DIAGNOSIS — E559 Vitamin D deficiency, unspecified: Secondary | ICD-10-CM

## 2018-08-30 LAB — CBC WITH DIFFERENTIAL/PLATELET
Basophils Absolute: 0 10*3/uL (ref 0.0–0.2)
Basos: 1 %
EOS (ABSOLUTE): 0.1 10*3/uL (ref 0.0–0.4)
Eos: 2 %
Hematocrit: 39.1 % (ref 34.0–46.6)
Hemoglobin: 12.4 g/dL (ref 11.1–15.9)
Immature Grans (Abs): 0 10*3/uL (ref 0.0–0.1)
Immature Granulocytes: 0 %
Lymphocytes Absolute: 2.1 10*3/uL (ref 0.7–3.1)
Lymphs: 32 %
MCH: 26.5 pg — ABNORMAL LOW (ref 26.6–33.0)
MCHC: 31.7 g/dL (ref 31.5–35.7)
MCV: 84 fL (ref 79–97)
Monocytes Absolute: 0.4 10*3/uL (ref 0.1–0.9)
Monocytes: 6 %
Neutrophils Absolute: 3.9 10*3/uL (ref 1.4–7.0)
Neutrophils: 59 %
Platelets: 350 10*3/uL (ref 150–450)
RBC: 4.68 x10E6/uL (ref 3.77–5.28)
RDW: 13.5 % (ref 11.7–15.4)
WBC: 6.5 10*3/uL (ref 3.4–10.8)

## 2018-08-30 LAB — TSH: TSH: 1.79 u[IU]/mL (ref 0.450–4.500)

## 2018-08-30 LAB — VITAMIN D 25 HYDROXY (VIT D DEFICIENCY, FRACTURES): Vit D, 25-Hydroxy: 20.7 ng/mL — ABNORMAL LOW (ref 30.0–100.0)

## 2018-08-30 MED ORDER — VITAMIN D (ERGOCALCIFEROL) 1.25 MG (50000 UNIT) PO CAPS: 50000.0000 [IU] | ORAL_CAPSULE | ORAL | 0 refills | Status: DC

## 2018-08-30 MED FILL — VIT D2 1.25 MG (50,000 UNIT: 1.25 MG | 28 days supply | Qty: 4 | Fill #0

## 2018-10-01 MED FILL — SERTRALINE HCL 100 MG TAB: 100 | 30 days supply | Qty: 30 | Fill #0

## 2018-10-02 MED FILL — VIT D2 1.25 MG (50,000 UNIT: 1.25 MG | 84 days supply | Qty: 12 | Fill #1

## 2018-10-16 ENCOUNTER — Ambulatory Visit: Payer: Self-pay | Admitting: Urology

## 2018-10-25 ENCOUNTER — Ambulatory Visit: Payer: Self-pay | Attending: Family Medicine | Admitting: Physician Assistant

## 2018-10-25 DIAGNOSIS — H6690 Otitis media, unspecified, unspecified ear: Secondary | ICD-10-CM

## 2018-10-25 DIAGNOSIS — H60502 Unspecified acute noninfective otitis externa, left ear: Secondary | ICD-10-CM

## 2018-10-25 MED ORDER — NEOMYCIN-POLYMYXIN-HC 3.5-10000-1 OT SOLN: 3.0000 [drp] | Freq: Four times a day (QID) | OTIC | 0 refills | Status: DC

## 2018-10-25 MED ORDER — AMOXICILLIN 500 MG PO CAPS: 500.0000 mg | ORAL_CAPSULE | Freq: Three times a day (TID) | ORAL | 0 refills | Status: DC

## 2018-10-25 MED FILL — AMOXICILLIN 500 MG CAPSULE: 500 | 10 days supply | Qty: 30 | Fill #0

## 2018-10-25 MED FILL — NEO/POLYMYXIN/HC EAR SOLN: 3.5-10000-1 | 12 days supply | Qty: 10 | Fill #0

## 2018-10-25 NOTE — Progress Notes (Signed)
Patient has been called and DOB has been verified. Patient has been screened and transferred to PCP to start phone visit.  Patient is having pain in left ear.

## 2018-10-25 NOTE — Progress Notes (Signed)
Patient ID: Tracie [name], female   DOB: 03/11/1981, 38 y.o.   MRN: [id number] Virtual Visit via Telephone Note  I connected with Shann Medal on 10/25/18 at 10:10 AM EDT by telephone and verified that I am speaking with the correct person using two identifiers.   I discussed the limitations, risks, security and privacy concerns of performing an evaluation and management service by telephone and the availability of in person appointments. I also discussed with the patient that there may be a patient responsible charge related to this service. The patient expressed understanding and agreed to proceed.  Patient location:  home My Location:  [place] office Persons on the call:  Me, the patient, and interpreter Ilyana  History of Present Illness: pain in L ear for about 10 days.  Using wax removal drops and hydrogen peroxide in her ear without relief.  She started having some pain in L ear after swimming for a few days.  That is what prompted her to use the drops.  No fever.  No cold symptoms.    Observations/Objective:  A&Ox3   Assessment and Plan: 1. Acute otitis media, unspecified otitis media type D/c all home drops and hydrogen peroxide - amoxicillin (AMOXIL) 500 MG capsule; Take 1 capsule (500 mg total) by mouth 3 (three) times daily.  Dispense: 30 capsule; Refill: 0  2. Acute otitis externa of left ear, unspecified type - neomycin-polymyxin-hydrocortisone (CORTISPORIN) OTIC solution; Place 3 drops into the left ear 4 (four) times daily.  Dispense: 10 mL; Refill: 0    Follow Up Instructions: appt in person with me in 1 week to check ears and will irrigate if needed   I discussed the assessment and treatment plan with the patient. The patient was provided an opportunity to ask questions and all were answered. The patient agreed with the plan and demonstrated an understanding of the instructions.   The patient was advised to call back or seek an in-person evaluation if the  symptoms worsen or if the condition fails to improve as anticipated.  I provided 14 minutes of non-face-to-face time during this encounter.   Freeman Caldron, PA-C

## 2018-10-31 ENCOUNTER — Other Ambulatory Visit: Payer: Self-pay

## 2018-10-31 ENCOUNTER — Ambulatory Visit: Payer: Self-pay | Attending: Family Medicine | Admitting: Physician Assistant

## 2018-10-31 VITALS — BP 117/71 | HR 69 | Temp 98.9°F | Ht 65.0 in | Wt 183.0 lb

## 2018-10-31 DIAGNOSIS — H9202 Otalgia, left ear: Secondary | ICD-10-CM

## 2018-10-31 MED ORDER — FLUCONAZOLE 150 MG PO TABS: 150.0000 mg | ORAL_TABLET | Freq: Once | ORAL | 0 refills | Status: AC

## 2018-10-31 MED FILL — FLUCONAZOLE 150 MG TABLET: 150 | 10 days supply | Qty: 2 | Fill #0

## 2018-10-31 NOTE — Progress Notes (Signed)
Pt. Is here to have her left ear check.   Pt. Stated the medication helps her ear pain.   Pt. Stated after she took the medication that was given for her ear, she is having vaginal itches.

## 2018-10-31 NOTE — Progress Notes (Signed)
Patient ID: Tracie [name], female   DOB: 1980/05/12, 38 y.o.   MRN: [id number]   Tracie [name], is a 38 y.o. female  [id number]:[id number]  [id number]  DOB - Aug 04, 1980  Subjective:  Chief Complaint and HPI: Tracie [name] is a 38 y.o. female here today  for a follow up visit for L ear pain.  Doing much better.  Compliant with antibiotics and drops.  Starting to have vaginal itching and wants diflucan.  No fever.       ROS:   Constitutional:  No f/c, No night sweats, No unexplained weight loss. EENT:  No vision changes, No blurry vision, No hearing changes. No mouth, throat, or other ear problems.  Respiratory: No cough, No SOB Cardiac: No CP, no palpitations GI:  No abd pain, No N/V/D. GU: No Urinary s/sx Musculoskeletal: No joint pain Neuro: No headache, no dizziness, no motor weakness.  Skin: No rash Endocrine:  No polydipsia. No polyuria.  Psych: Denies SI/HI  No problems updated.  ALLERGIES: No Known Allergies  PAST MEDICAL HISTORY: Past Medical History:  Diagnosis Date  . Headache     MEDICATIONS AT HOME: Prior to Admission medications   Medication Sig Start Date End Date Taking? Authorizing Provider  amoxicillin (AMOXIL) 500 MG capsule Take 1 capsule (500 mg total) by mouth 3 (three) times daily. 10/25/18  Yes Argentina Donovan, PA-C  hydrOXYzine (ATARAX/VISTARIL) 10 MG tablet Take 1 tablet (10 mg total) by mouth 3 (three) times daily as needed. 12/01/17  Yes Gildardo Pounds, NP  neomycin-polymyxin-hydrocortisone (CORTISPORIN) OTIC solution Place 3 drops into the left ear 4 (four) times daily. 10/25/18  Yes Freeman Caldron M, PA-C  sertraline (ZOLOFT) 100 MG tablet Take 1 tablet (100 mg total) by mouth daily. 12/01/17  Yes Gildardo Pounds, NP  Azelastine HCl 137 MCG/SPRAY SOLN Place 2 sprays into the nose daily. Patient not taking: Reported on 10/25/2018 12/04/17   Gildardo Pounds, NP  cetirizine (ZYRTEC) 10 MG tablet Take 1 tablet (10 mg total) by mouth  daily. Patient not taking: Reported on 10/25/2018 10/02/17   Charlott Rakes, MD  fluconazole (DIFLUCAN) 150 MG tablet Take 1 tablet (150 mg total) by mouth once for 1 dose. And 1 in 5 days. 10/31/18 10/31/18  Argentina Donovan, PA-C  fluticasone (FLONASE) 50 MCG/ACT nasal spray Place 2 sprays into both nostrils daily. Patient not taking: Reported on 10/25/2018 12/04/17   Gildardo Pounds, NP  meclizine (ANTIVERT) 25 MG tablet Take 1 tablet (25 mg total) by mouth 3 (three) times daily as needed for dizziness. Patient not taking: Reported on 10/25/2018 07/26/18   Argentina Donovan, PA-C  ondansetron (ZOFRAN) 4 MG tablet Take 1 tablet (4 mg total) by mouth every 8 (eight) hours as needed for nausea or vomiting. Patient not taking: Reported on 10/25/2018 07/26/18   Argentina Donovan, PA-C  Vitamin D, Ergocalciferol, (DRISDOL) 1.25 MG (50000 UT) CAPS capsule Take 1 capsule (50,000 Units total) by mouth every 7 (seven) days. Patient not taking: Reported on 10/25/2018 08/30/18   Argentina Donovan, PA-C     Objective:  EXAM:   Vitals:   10/31/18 1044  BP: 117/71  Pulse: 69  Temp: 98.9 F (37.2 C)  TempSrc: Oral  SpO2: 100%  Weight: 183 lb (83 kg)  Height: 5\' 5"  (1.651 m)    General appearance : A&OX3. NAD. Non-toxic-appearing.  Exam limited due to covid  HEENT: Atraumatic and Normocephalic.  PERRLA. EOM intact.  TM clear B. Canal WNL.  Small amount of cerumen present B.   Chest/Lungs:  Breathing-non-labored, Good air entry bilaterally, breath sounds normal without rales, rhonchi, or wheezing  CVS: S1 S2 regular, no murmurs, gallops, rubs  Psych:  TP linear. J/I WNL. Normal speech. Appropriate eye contact and affect.  Skin:  No Rash  Data Review Lab Results  Component Value Date   HGBA1C 5.6 10/23/2015   HGBA1C 5.5 09/09/2013     Assessment & Plan   1. Left ear pain Resolved.  d/c drops.  Finish antibiotics Cover for yeast due to antibiotics.    Patient have been counseled extensively about  nutrition and exercise  Return if symptoms worsen or fail to improve.  The patient was given clear instructions to go to ER or return to medical center if symptoms don't improve, worsen or new problems develop. The patient verbalized understanding. The patient was told to call to get lab results if they haven't heard anything in the next week.     Georgian CoAngela Grant Swager, PA-C [facility] [place], [place] [phone]   10/31/2018, 11:06 AM

## 2018-10-31 NOTE — Patient Instructions (Signed)
Candidiasis vaginal en los adultos Vaginal Yeast infection, Adult  La infeccin mictica vaginal es una afeccin que causa secrecin vaginal y tambin dolor, hinchazn y enrojecimiento (inflamacin) de la vagina. Esta es una enfermedad frecuente. Algunas mujeres contraen esta infeccin con frecuencia. Cules son las causas? La causa de la infeccin es un cambio en el equilibrio normal de los hongos (cndida) y las bacterias que viven en la vagina. Esta alteracin deriva en el crecimiento excesivo de los hongos, lo que causa la inflamacin. Qu incrementa el riesgo? La afeccin es ms probable en las mujeres que tienen estas caractersticas:  Toman antibiticos.  Tienen diabetes.  Toman anticonceptivos orales.  Estn embarazadas.  Se hacen duchas vaginales con frecuencia.  Tienen debilitado el sistema de defensa del organismo (sistema inmunitario).  Han estado tomando medicamentos con corticoesteroides durante mucho tiempo.  Usan ropa ajustada con frecuencia. Cules son los signos o los sntomas? Los sntomas de esta afeccin incluyen los siguientes:  Secrecin vaginal blanca, cremosa y espesa.  Hinchazn, picazn, enrojecimiento e irritacin de la vagina. Los labios de la vagina (vulva) tambin se pueden infectar.  Dolor o ardor al orinar.  Dolor durante las relaciones sexuales. Cmo se diagnostica? Esta afeccin se diagnostica en funcin de lo siguiente:  Sus antecedentes mdicos.  Un examen fsico.  Examen plvico. El mdico examinar una muestra de la secrecin vaginal con un microscopio. Probablemente el mdico enve esta muestra al laboratorio para analizarla y confirmar el diagnstico. Cmo se trata? Esta afeccin se trata con medicamentos. Los medicamentos pueden ser recetados o de venta libre. Podrn indicarle que use uno o ms de lo siguiente:  Medicamentos que se toman por boca (orales).  Medicamentos que se aplican como una crema (tpicos).   Medicamentos que se colocan directamente en la vagina (vulos vaginales). Siga estas indicaciones en su casa:  Estilo de vida  No tenga relaciones sexuales hasta que el mdico lo autorice. Comunique a su compaero sexual que tiene una infeccin por hongos. Esa persona debera visitar a su mdico y preguntarle si debera recibir tratamiento tambin.  No use ropa ajustada, como pantimedias o pantalones ajustados.  Use ropa interior de algodn, que permite el paso del aire. Indicaciones generales  Tome o aplquese los medicamentos de venta libre y los recetados solamente como se lo haya indicado el mdico.  Consuma ms yogur. Esto puede ayudar a evitar la recurrencia de la candidiasis.  No use tampones hasta que el mdico la autorice.  Intente darse un bao de asiento para aliviar las molestias. Se trata de un bao de agua tibia que se toma mientras se est sentado. El agua solo debe llegar hasta las caderas y cubrir las nalgas. Hgalo 3o 4veces al da o como se lo haya indicado el mdico.  No se haga duchas vaginales.  Si tiene diabetes, mantenga bajo control los niveles de azcar en la sangre.  Concurra a todas las visitas de control como se lo haya indicado el mdico. Esto es importante. Comunquese con un mdico si:  Tiene fiebre.  Los sntomas desaparecen y luego vuelven a aparecer.  Los sntomas no mejoran con el tratamiento.  Sus sntomas empeoran.  Aparecen nuevos sntomas.  Aparecen ampollas alrededor o adentro de la vagina.  Le sale sangre de la vagina y no est menstruando.  Siente dolor en el abdomen. Resumen  La infeccin mictica vaginal es una afeccin que causa secrecin y tambin dolor, hinchazn y enrojecimiento (inflamacin) de la vagina.  Esta afeccin se trata con medicamentos. Los   medicamentos pueden ser recetados o de venta libre.  Tome o aplquese los medicamentos de venta libre y los recetados solamente como se lo haya indicado el mdico.  No  se haga duchas vaginales. No tenga relaciones sexuales ni use tampones hasta que el mdico la autorice.  Comunquese con un mdico si los sntomas no mejoran con el tratamiento o si los sntomas desaparecen y luego regresan. Esta informacin no tiene como fin reemplazar el consejo del mdico. Asegrese de hacerle al mdico cualquier pregunta que tenga. Document Released: 12/15/2004 Document Revised: 09/14/2017 Document Reviewed: 09/14/2017 Elsevier Patient Education  2020 Elsevier Inc.  

## 2018-11-20 MED FILL — SERTRALINE HCL 100 MG TAB: 100 | 30 days supply | Qty: 30 | Fill #1

## 2018-11-21 ENCOUNTER — Ambulatory Visit: Payer: Self-pay | Admitting: Family Medicine

## 2018-11-29 MED FILL — hydrOXYzine HCL 10 MG TABS: 10 | 30 days supply | Qty: 90 | Fill #0

## 2018-12-03 ENCOUNTER — Other Ambulatory Visit: Payer: Self-pay

## 2018-12-03 ENCOUNTER — Ambulatory Visit: Payer: Self-pay | Attending: Family Medicine

## 2018-12-18 ENCOUNTER — Other Ambulatory Visit: Payer: Self-pay

## 2018-12-18 ENCOUNTER — Ambulatory Visit: Payer: Self-pay | Attending: Family Medicine | Admitting: Nurse Practitioner

## 2018-12-18 ENCOUNTER — Encounter: Payer: Self-pay | Admitting: Nurse Practitioner

## 2018-12-18 DIAGNOSIS — M79672 Pain in left foot: Secondary | ICD-10-CM

## 2018-12-18 DIAGNOSIS — M79671 Pain in right foot: Secondary | ICD-10-CM

## 2018-12-18 MED ORDER — DICLOFENAC SODIUM 1 % TD GEL: 2.0000 g | Freq: Four times a day (QID) | TRANSDERMAL | 1 refills | Status: AC

## 2018-12-18 NOTE — Progress Notes (Signed)
Virtual Visit via Telephone Note Due to national recommendations of social distancing due to COVID 19, telehealth visit is felt to be most appropriate for this patient at this time.  I discussed the limitations, risks, security and privacy concerns of performing an evaluation and management service by telephone and the availability of in person appointments. I also discussed with the patient that there may be a patient responsible charge related to this service. The patient expressed understanding and agreed to proceed.    I connected with Tracie [name] on 12/18/18  at   2:50 PM EDT  EDT by telephone and verified that I am speaking with the correct person using two identifiers.   Consent I discussed the limitations, risks, security and privacy concerns of performing an evaluation and management service by telephone and the availability of in person appointments. I also discussed with the patient that there may be a patient responsible charge related to this service. The patient expressed understanding and agreed to proceed.   Location of Patient: Private residence   [facility] of Provider: [facility]    Persons participating in Telemedicine visit: Tracie [name] [facility] CMA Tracie [name]  Interpreter Tracie [name] [phone]   History of Present Illness: Telemedicine visit for: Establish Care  has a past medical history of Headache.  She has complaints of bilateral foot pain today and feeling small "knots" in the heel of her left foot. Aggravating factors: Prolonged walking. She takes tylenol for pain which provides some relief of her pain. She has also tried ibuprofen. She has tried to wear different types of shoes. Onset of symptoms: Over 3 months ago.   Past Medical History:  Diagnosis Date  . Headache     History reviewed. No pertinent surgical history.  Family History  Problem Relation Age of Onset  . Hypertension Neg Hx     Social  History   Socioeconomic History  . Marital status: Single    Spouse name: Not on file  . Number of children: Not on file  . Years of education: Not on file  . Highest education level: Not on file  Occupational History  . Not on file  Social Needs  . Financial resource strain: Not on file  . Food insecurity    Worry: Not on file    Inability: Not on file  . Transportation needs    Medical: Not on file    Non-medical: Not on file  Tobacco Use  . Smoking status: Never Smoker  . Smokeless tobacco: Never Used  Substance and Sexual Activity  . Alcohol use: No  . Drug use: No  . Sexual activity: Yes    Birth control/protection: Condom  Lifestyle  . Physical activity    Days per week: Not on file    Minutes per session: Not on file  . Stress: Not on file  Relationships  . Social Musician on phone: Not on file    Gets together: Not on file    Attends religious service: Not on file    Active member of club or organization: Not on file    Attends meetings of clubs or organizations: Not on file    Relationship status: Not on file  Other Topics Concern  . Not on file  Social History Narrative  . Not on file     Observations/Objective: Awake, alert and oriented x 3   Review of Systems  Constitutional: Negative for fever, malaise/fatigue and weight loss.  HENT: Negative.  Negative for nosebleeds.   Eyes: Negative.  Negative for blurred vision, double vision and photophobia.  Respiratory: Negative.  Negative for cough and shortness of breath.   Cardiovascular: Negative.  Negative for chest pain, palpitations and leg swelling.  Gastrointestinal: Negative.  Negative for heartburn, nausea and vomiting.  Musculoskeletal: Negative for myalgias.       SEE HPI  Neurological: Positive for headaches (controlled). Negative for dizziness, focal weakness and seizures.  Psychiatric/Behavioral: Negative.  Negative for suicidal ideas.    Assessment and Plan:  Diagnoses and  all orders for this visit:  Heel pain, bilateral -     Ambulatory referral to Podiatry -     diclofenac sodium (VOLTAREN) 1 % GEL; Apply 2 g topically 4 (four) times daily.     Follow Up Instructions Return in about 2 months (around 02/17/2019).     I discussed the assessment and treatment plan with the patient. The patient was provided an opportunity to ask questions and all were answered. The patient agreed with the plan and demonstrated an understanding of the instructions.   The patient was advised to call back or seek an in-person evaluation if the symptoms worsen or if the condition fails to improve as anticipated.  I provided 16 minutes of non-face-to-face time during this encounter including median intraservice time, reviewing previous notes, labs, imaging, medications and explaining diagnosis and management.  Tracie [name], [name]

## 2018-12-26 ENCOUNTER — Ambulatory Visit: Payer: Self-pay | Admitting: Podiatry

## 2018-12-26 ENCOUNTER — Telehealth: Payer: Self-pay | Admitting: Nurse Practitioner

## 2018-12-26 NOTE — Telephone Encounter (Signed)
Will route to Chiropractor.

## 2018-12-26 NOTE — Telephone Encounter (Signed)
Pt was informed that she was approve for CAFA and I will be sending a copy of the letter by mail today

## 2018-12-26 NOTE — Telephone Encounter (Signed)
Patient called wanting to get an update on her cafa. Please follow up.

## 2019-01-05 IMAGING — CT CT MAXILLOFACIAL W/O CM
3 series · 11 of 47 positions shown, 13 images · non-contrast
Comparison: 05/20/2014 head CT

CLINICAL DATA: Chronic maxillary sinusitis.

EXAM:
CT MAXILLOFACIAL WITHOUT CONTRAST
TECHNIQUE: Multidetector CT images of the paranasal sinuses were obtained using
the standard protocol without intravenous contrast.

[Series 2: standard · axial · 0.49mm/px · z∈[-121,-41]mm · 5 of 102 slices shown, 7 images]
[im 11/102  brain]
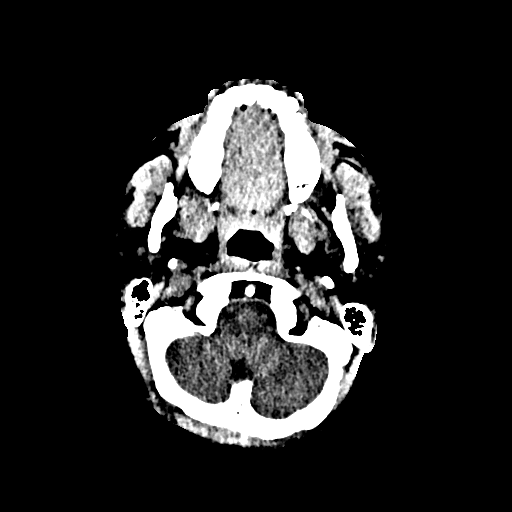
[im 11/102  bone]
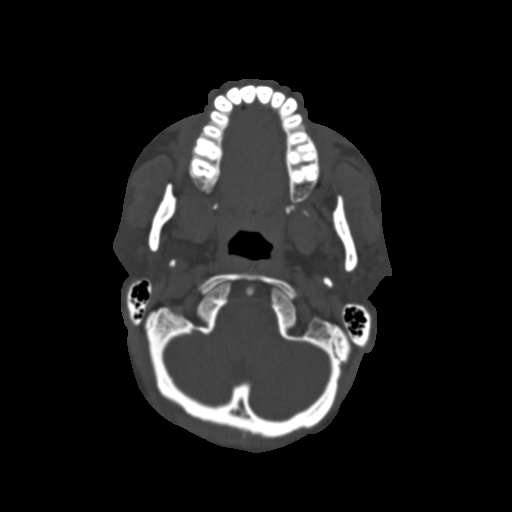
[im 32/102  bone]
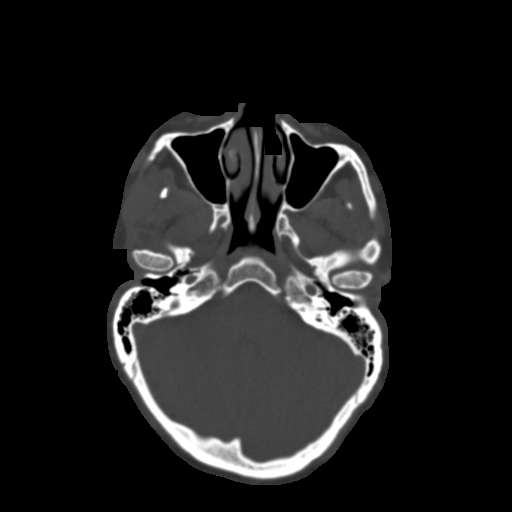
[im 53/102  bone]
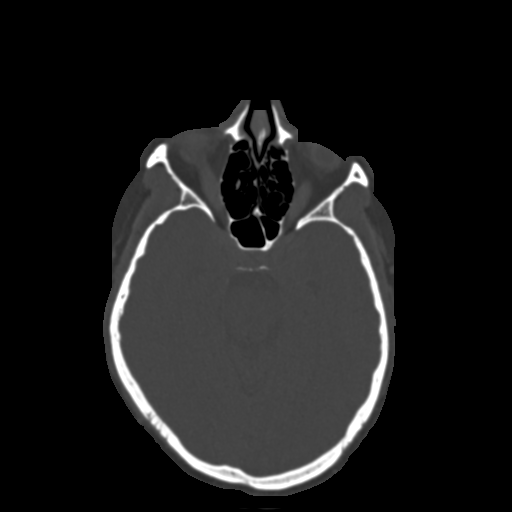
[im 70/102  bone]
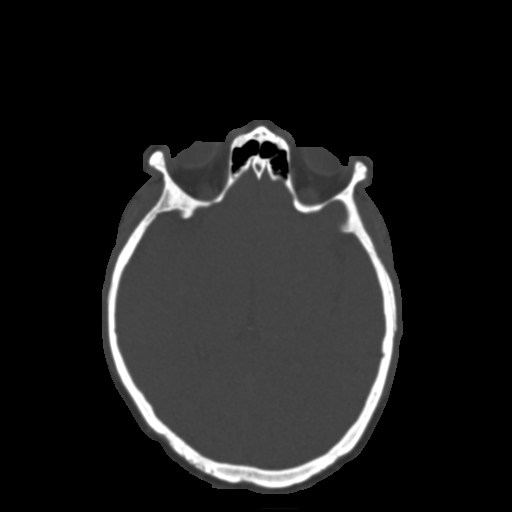
[im 91/102  brain]
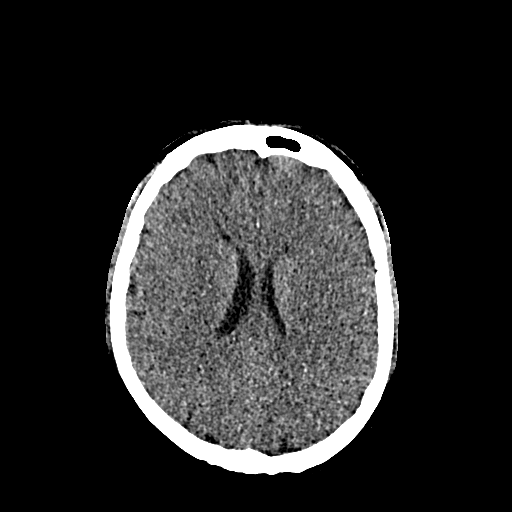
[im 91/102  bone]
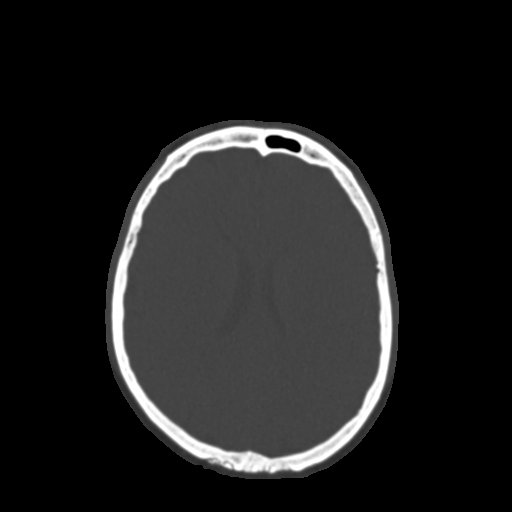

[Series 4: coronal · coronal · 0.20mm/px · 3 of 106 slices shown]
[im 36/106  bone]
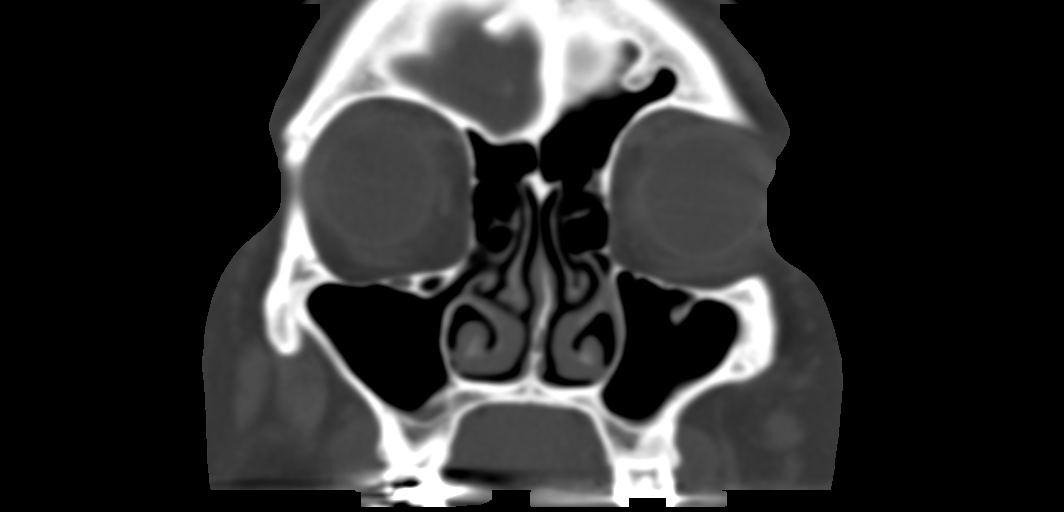
[im 47/106  bone]
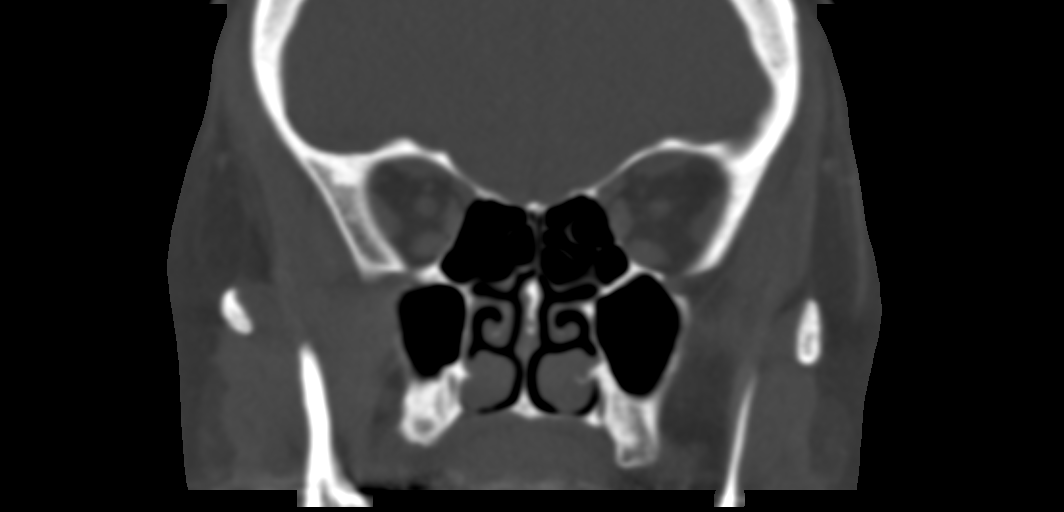
[im 59/106  bone]
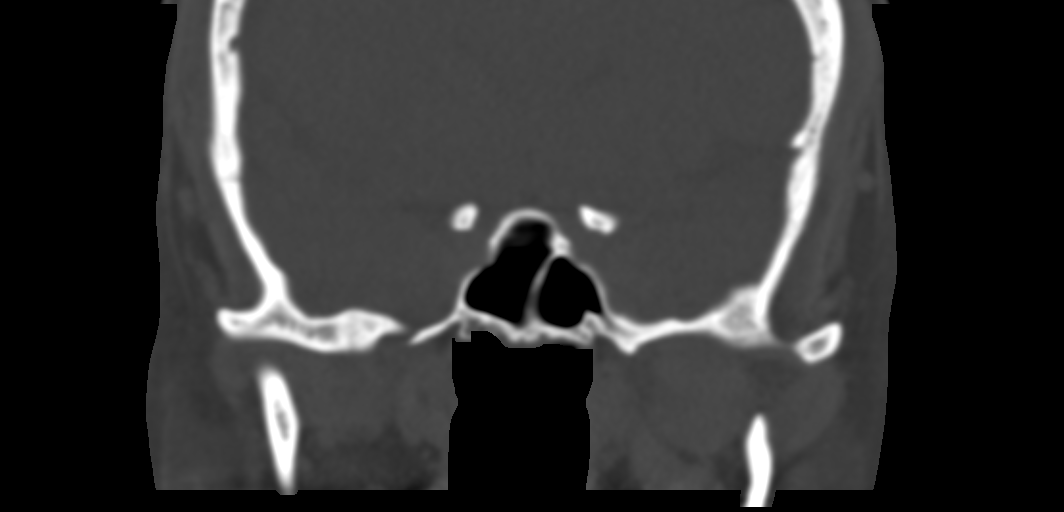

[Series 5: sagittal · sagittal · 0.21mm/px · 3 of 100 slices shown]
[im 34/100  bone]
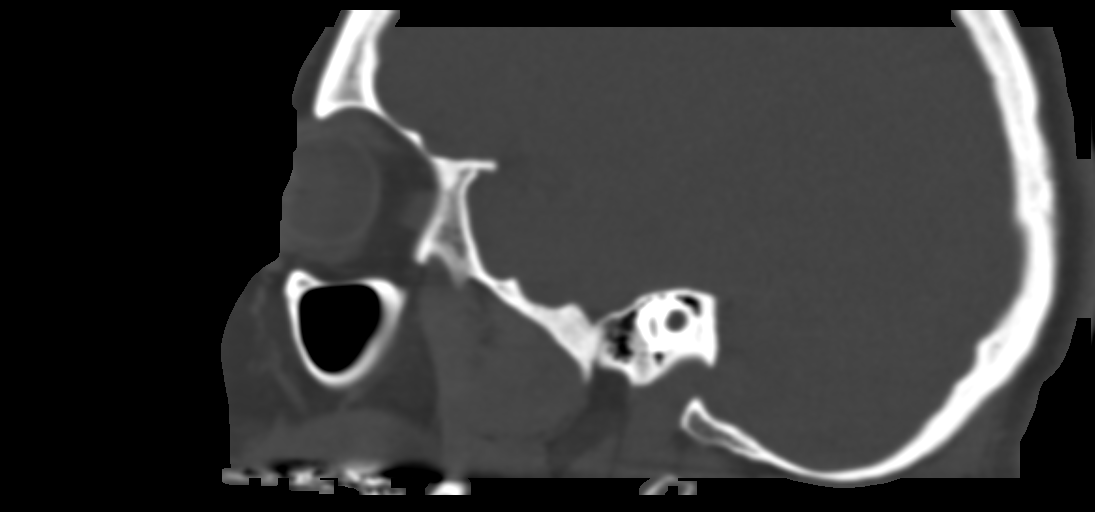
[im 50/100  bone]
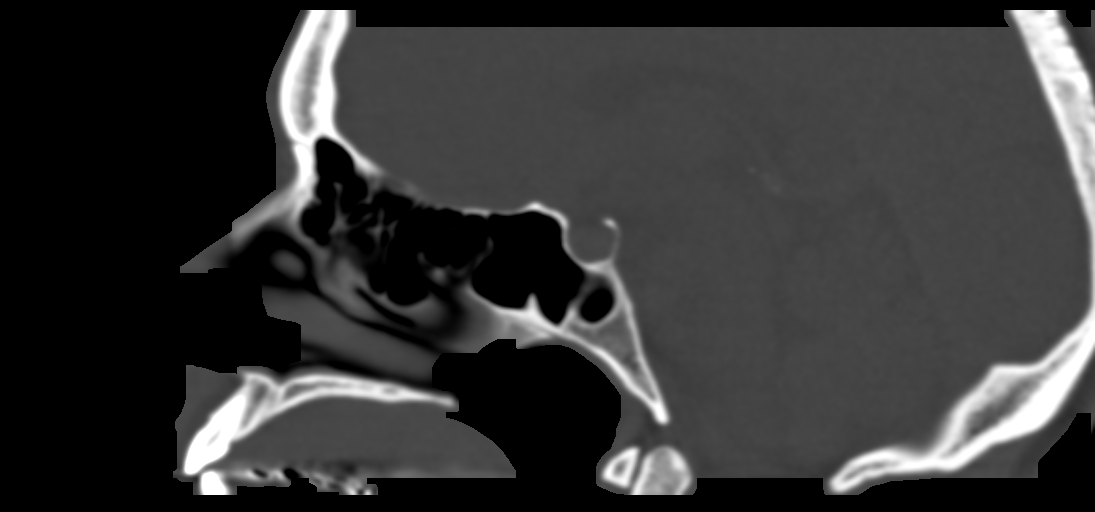
[im 67/100  bone]
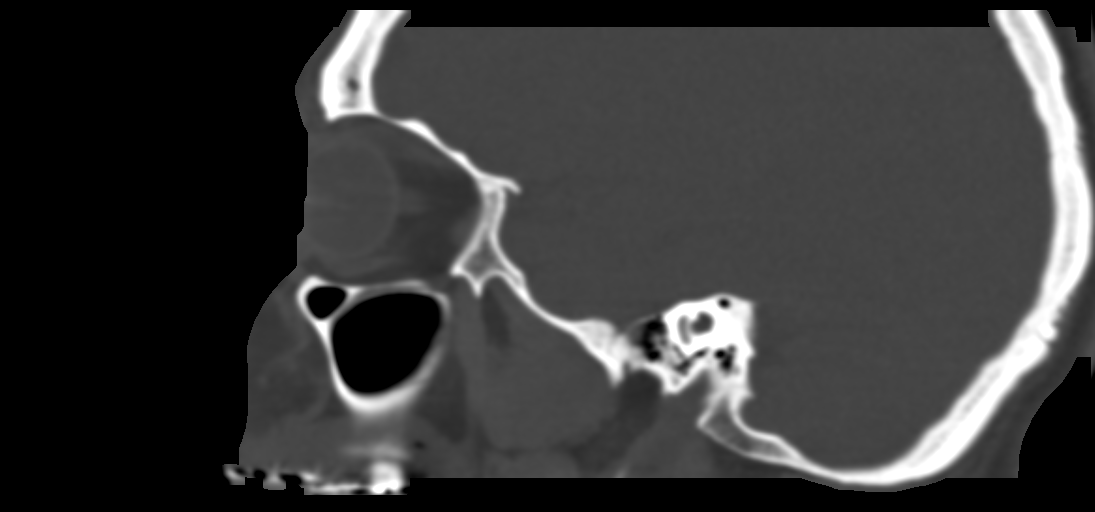

[11 of 47 positions shown; findings below may reference images not displayed]

FINDINGS: Paranasal sinuses:

Frontal: Hypoplastic on the right.  No opacification.

Ethmoid: Normally aerated.

Maxillary: Normally aerated.

Sphenoid: Normally aerated. Patent sphenoethmoidal recesses.

Right ostiomeatal unit: Patent despite large ethmoid bulla with
horizontal uncinate process

Left ostiomeatal unit: Patent despite minimal concha bullosa and
prominent ethmoid bulla.

Nasal passages: Patent. Intact nasal septum is essentially midline.

Anatomy: No pneumatization superior to anterior ethmoid notches.
Sellar sphenoid pneumatization pattern. No dehiscence of carotid or
optic canals. No onodi cell.

Other: Orbits and intracranial compartment are unremarkable. Visible
mastoid air cells are normally aerated.
IMPRESSION: Normally aerated paranasal sinuses.  Patent sinus drainage pathways.

## 2019-01-07 ENCOUNTER — Other Ambulatory Visit: Payer: Self-pay | Admitting: Podiatry

## 2019-01-07 ENCOUNTER — Other Ambulatory Visit: Payer: Self-pay

## 2019-01-07 ENCOUNTER — Ambulatory Visit: Payer: No Typology Code available for payment source

## 2019-01-07 ENCOUNTER — Ambulatory Visit: Payer: No Typology Code available for payment source | Admitting: Podiatry

## 2019-01-07 DIAGNOSIS — M778 Other enthesopathies, not elsewhere classified: Secondary | ICD-10-CM

## 2019-01-07 DIAGNOSIS — M79672 Pain in left foot: Secondary | ICD-10-CM

## 2019-01-07 DIAGNOSIS — M79671 Pain in right foot: Secondary | ICD-10-CM

## 2019-01-07 MED ORDER — DICLOFENAC SODIUM 75 MG PO TBEC: 75.0000 mg | DELAYED_RELEASE_TABLET | Freq: Two times a day (BID) | ORAL | 1 refills | Status: DC

## 2019-01-07 MED FILL — DICLOFENAC SOD EC 75 MG TAB: 75 | 30 days supply | Qty: 60 | Fill #0

## 2019-01-07 NOTE — Progress Notes (Signed)
3

## 2019-01-08 ENCOUNTER — Other Ambulatory Visit: Payer: Self-pay | Admitting: Nurse Practitioner

## 2019-01-08 DIAGNOSIS — F329 Major depressive disorder, single episode, unspecified: Secondary | ICD-10-CM

## 2019-01-08 DIAGNOSIS — F419 Anxiety disorder, unspecified: Secondary | ICD-10-CM

## 2019-01-08 MED FILL — hydrOXYzine HCL 10 MG TABS: 10 | 30 days supply | Qty: 90 | Fill #0

## 2019-01-10 ENCOUNTER — Ambulatory Visit: Payer: Self-pay | Admitting: Pharmacist

## 2019-01-10 NOTE — Progress Notes (Signed)
   HPI: 38 y.o. female presenting today as a new patient with a chief complaint of diffuse bilateral foot pain that began about three months ago. She states the pain intermittently radiates to her knees. She reports some associated swelling. She has not had any treatment for the symptoms and say they worsen with walking. She denies any known trauma or injury. Patient is here for further evaluation and treatment.   Past Medical History:  Diagnosis Date  . Headache      Physical Exam: General: The patient is alert and oriented x3 in no acute distress.  Dermatology: Skin is warm, dry and supple bilateral lower extremities. Negative for open lesions or macerations.  Vascular: Palpable pedal pulses bilaterally. No edema or erythema noted. Capillary refill within normal limits.  Neurological: Epicritic and protective threshold grossly intact bilaterally.   Musculoskeletal Exam: Range of motion within normal limits to all pedal and ankle joints bilateral. Muscle strength 5/5 in all groups bilateral.   Radiographic Exam:  Normal osseous mineralization. Joint spaces preserved. No fracture/dislocation/boney destruction.    Assessment: 1. Generalized foot pain bilateral   Plan of Care:  1. Patient evaluated. X-Rays reviewed.  2. Recommended OTC insoles.  3. Prescription for Diclofenac provided to patient. 4. Return to clinic as needed.       Edrick Kins, DPM [facility]  Dr. Edrick Kins, DPM    [address], [place] [address]                Office [phone]  Fax [phone]

## 2019-01-15 ENCOUNTER — Ambulatory Visit (INDEPENDENT_AMBULATORY_CARE_PROVIDER_SITE_OTHER): Payer: Self-pay | Admitting: Urology

## 2019-01-15 DIAGNOSIS — R32 Unspecified urinary incontinence: Secondary | ICD-10-CM

## 2019-01-15 DIAGNOSIS — N3943 Post-void dribbling: Secondary | ICD-10-CM

## 2019-01-16 ENCOUNTER — Ambulatory Visit: Payer: Self-pay | Admitting: Pharmacist

## 2019-01-24 ENCOUNTER — Other Ambulatory Visit: Payer: Self-pay

## 2019-01-24 ENCOUNTER — Ambulatory Visit: Payer: Self-pay | Attending: Nurse Practitioner | Admitting: Pharmacist

## 2019-01-24 DIAGNOSIS — Z23 Encounter for immunization: Secondary | ICD-10-CM

## 2019-01-24 MED FILL — hydrOXYzine HCL 10 MG TABS: 10 | 30 days supply | Qty: 90 | Fill #0

## 2019-01-24 NOTE — Progress Notes (Signed)
Patient presents for vaccination against influenza per orders of Zelda. Consent given. Counseling provided. No contraindications exists. Vaccine administered without incident.   

## 2019-02-21 ENCOUNTER — Other Ambulatory Visit: Payer: Self-pay

## 2019-02-21 ENCOUNTER — Ambulatory Visit: Payer: Self-pay | Attending: Nurse Practitioner | Admitting: Physician Assistant

## 2019-02-21 VITALS — BP 112/70 | HR 81 | Temp 98.3°F | Ht 65.0 in | Wt 181.0 lb

## 2019-02-21 DIAGNOSIS — Z789 Other specified health status: Secondary | ICD-10-CM

## 2019-02-21 DIAGNOSIS — R109 Unspecified abdominal pain: Secondary | ICD-10-CM

## 2019-02-21 DIAGNOSIS — E559 Vitamin D deficiency, unspecified: Secondary | ICD-10-CM

## 2019-02-21 LAB — POCT URINALYSIS DIP (CLINITEK)
Bilirubin, UA: NEGATIVE
Blood, UA: NEGATIVE
Glucose, UA: NEGATIVE mg/dL
Leukocytes, UA: NEGATIVE
Nitrite, UA: NEGATIVE
POC PROTEIN,UA: NEGATIVE
Spec Grav, UA: 1.02 (ref 1.010–1.025)
Urobilinogen, UA: 0.2 E.U./dL
pH, UA: 6.5 (ref 5.0–8.0)

## 2019-02-21 LAB — POCT URINE PREGNANCY: Preg Test, Ur: NEGATIVE

## 2019-02-21 NOTE — Progress Notes (Signed)
Patient ID: Tracie [name], female   DOB: 1980-09-02, 38 y.o.   MRN: [id number]    Tracie [name], is a 38 y.o. female  [id number]:[id number]  [id number]  DOB - September 12, 1980  Subjective:  Chief Complaint and HPI: Tracie [name] is a 38 y.o. female here today Pain in mid to lower abdomen for about 2 weeks and radiates to lower back. Feels like cramping.   No vaginal discharge.  No STD risk factors.  No urinary s/sx.  No fever.  No N/V/D.  Pain goes away with advil.  Periods regular.   LMP-November 13th.  Pain is moderate in intensity.  No exacerbating factors.    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    Also wants to get vitamin d checked while she is here.     ROS:   Constitutional:  No f/c, No night sweats, No unexplained weight loss. EENT:  No vision changes, No blurry vision, No hearing changes. No mouth, throat, or ear problems.  Respiratory: No cough, No SOB Cardiac: No CP, no palpitations GI:  + abd pain, No N/V/D. GU: No Urinary s/sx Musculoskeletal: No joint pain Neuro: No headache, no dizziness, no motor weakness.  Skin: No rash Endocrine:  No polydipsia. No polyuria.  Psych: Denies SI/HI  No problems updated.  ALLERGIES: No Known Allergies  PAST MEDICAL HISTORY: Past Medical History:  Diagnosis Date  . Headache     MEDICATIONS AT HOME: Prior to Admission medications   Medication Sig Start Date End Date Taking? Authorizing Provider  hydrOXYzine (ATARAX/VISTARIL) 10 MG tablet TAKE 1 TABLET BY MOUTH 3 TIMES DAILY AS NEEDED. 01/08/19  Yes Charlott Rakes, MD  diclofenac (VOLTAREN) 75 MG EC tablet Take 1 tablet (75 mg total) by mouth 2 (two) times daily. Patient not taking: Reported on 02/21/2019 01/07/19   Edrick Kins, DPM  ondansetron (ZOFRAN) 4 MG tablet Take 1 tablet (4 mg total) by mouth every 8 (eight) hours as needed for nausea or vomiting. Patient not taking: Reported on 10/25/2018 07/26/18   Argentina Donovan, PA-C  sertraline  (ZOLOFT) 100 MG tablet Take 1 tablet (100 mg total) by mouth daily. Patient not taking: Reported on 02/21/2019 12/01/17   Gildardo Pounds, NP  Vitamin D, Ergocalciferol, (DRISDOL) 1.25 MG (50000 UT) CAPS capsule Take 1 capsule (50,000 Units total) by mouth every 7 (seven) days. Patient not taking: Reported on 02/21/2019 08/30/18   Argentina Donovan, PA-C     Objective:  EXAM:   Vitals:   02/21/19 0907  BP: 112/70  Pulse: 81  Temp: 98.3 F (36.8 C)  TempSrc: Oral  SpO2: 100%  Weight: 181 lb (82.1 kg)  Height: 5\' 5"  (1.651 m)    General appearance : A&OX3. NAD. Non-toxic-appearing HEENT: Atraumatic and Normocephalic.  PERRLA. EOM intact.  Neck: supple, no JVD. No cervical lymphadenopathy. No thyromegaly Chest/Lungs:  Breathing-non-labored, Good air entry bilaterally, breath sounds normal without rales, rhonchi, or wheezing  CVS: S1 S2 regular, no murmurs, gallops, rubs  Abdomen: Bowel sounds present, Non tender and not distended with no gaurding, rigidity or rebound.  Minimal TTp just below umbilicus Extremities: Bilateral Lower Ext shows no edema, both legs are warm to touch with = pulse throughout Neurology:  CN II-XII grossly intact, Non focal.   Psych:  TP linear. J/I WNL. Normal speech. Appropriate eye contact and affect.  Skin:  No Rash  Data Review Lab Results  Component Value Date   HGBA1C 5.6 10/23/2015   HGBA1C 5.5 09/09/2013  Assessment & Plan   1. Abdominal pain, unspecified abdominal location No red flags;  Non-acute abdomen currently.  To ED if worsens.  For now, take advil.   - POCT URINALYSIS DIP (CLINITEK) - POCT urine pregnancy - Comprehensive metabolic panel - CBC with Differential - US Pelvic Complete With Transvaginal; Future  2. Vitamin D deficiency - Vitamin D, 25-hydroxy  3. Language barrier [employer] interpreters used and additional time performing visit was required.    Patient have been counseled extensively about nutrition and exercise   Return in about 3 months (around 05/22/2019) for with PCP;  sooner if needed.  The patient was given clear instructions to go to ER or return to medical center if symptoms don't improve, worsen or new problems develop. The patient verbalized understanding. The patient was told to call to get lab results if they haven't heard anything in the next week.     Georgian Co, PA-C [facility] [place], [place] [phone]   02/21/2019, 9:33 AM

## 2019-02-21 NOTE — Patient Instructions (Signed)
Dolor plvico en la mujer Pelvic Pain, Female El dolor plvico se siente en la parte inferior del vientre (abdomen), debajo del ombligo y a nivel de las caderas. El dolor puede comenzar en forma repentina (ser agudo), reaparecer (ser recurrente) o durar mucho tiempo (volverse crnico). El dolor plvico que dura ms de 6 meses se denomina dolor plvico crnico. El dolor plvico puede tener muchas causas. A veces, la causa del dolor plvico no se conoce. Siga estas indicaciones en su casa:   Tome los medicamentos de venta libre y los recetados solamente como se lo haya indicado el mdico.  Haga reposo como se lo haya indicado el mdico.  No tenga relaciones sexuales si siente dolor.  Lleve un registro del dolor plvico. Escriba los siguientes datos: ? Cundo comenz el dolor. ? La ubicacin del dolor. ? Qu parece mejorar o empeorar el dolor, como alimentos o el perodo (ciclo menstrual). ? Cualquier sntoma que se presente junto con el dolor.  Concurra a todas las visitas de control como se lo haya indicado el mdico. Esto es importante. Comunquese con un mdico si:  Los medicamentos no le alivian el dolor.  El dolor regresa.  Aparecen nuevos sntomas.  Tiene sangrado o secrecin inusual de la vagina.  Tiene fiebre o escalofros.  Tiene dificultad para defecar (estreimiento).  Observa sangre en el pis (orina) o en la materia fecal (heces).  El pis tiene mal olor.  Se siente dbil o siente que va a desvanecerse. Solicite ayuda inmediatamente si:  Siente un dolor repentino y muy intenso.  El dolor es cada vez peor.  Siente un dolor muy intenso y tambin tiene alguno de estos sntomas: ? Fiebre. ? Malestar estomacal (nuseas). ? Vmitos. ? Tiene mucho sudor.  Se desmaya (pierde el conocimiento). Resumen  El dolor plvico se siente en la parte inferior del vientre (abdomen), debajo del ombligo y a nivel de las caderas.  Hay muchas causas posibles del dolor plvico.   Lleve un registro del dolor plvico. Esta informacin no tiene como fin reemplazar el consejo del mdico. Asegrese de hacerle al mdico cualquier pregunta que tenga. Document Released: 09/06/2011 Document Revised: 10/26/2017 Document Reviewed: 10/26/2017 Elsevier Patient Education  2020 Elsevier Inc.  

## 2019-02-21 NOTE — Progress Notes (Signed)
Patient is having pain in lower abdomen and radiates to the lower back.

## 2019-02-22 ENCOUNTER — Other Ambulatory Visit: Payer: Self-pay | Admitting: Physician Assistant

## 2019-02-22 DIAGNOSIS — E559 Vitamin D deficiency, unspecified: Secondary | ICD-10-CM

## 2019-02-22 LAB — CBC WITH DIFFERENTIAL/PLATELET
Basophils Absolute: 0 10*3/uL (ref 0.0–0.2)
Basos: 0 %
EOS (ABSOLUTE): 0.1 10*3/uL (ref 0.0–0.4)
Eos: 1 %
Hematocrit: 36.3 % (ref 34.0–46.6)
Hemoglobin: 12.1 g/dL (ref 11.1–15.9)
Immature Grans (Abs): 0 10*3/uL (ref 0.0–0.1)
Immature Granulocytes: 0 %
Lymphocytes Absolute: 2 10*3/uL (ref 0.7–3.1)
Lymphs: 32 %
MCH: 26.9 pg (ref 26.6–33.0)
MCHC: 33.3 g/dL (ref 31.5–35.7)
MCV: 81 fL (ref 79–97)
Monocytes Absolute: 0.5 10*3/uL (ref 0.1–0.9)
Monocytes: 8 %
Neutrophils Absolute: 3.6 10*3/uL (ref 1.4–7.0)
Neutrophils: 59 %
Platelets: 333 10*3/uL (ref 150–450)
RBC: 4.49 x10E6/uL (ref 3.77–5.28)
RDW: 12.9 % (ref 11.7–15.4)
WBC: 6.3 10*3/uL (ref 3.4–10.8)

## 2019-02-22 LAB — COMPREHENSIVE METABOLIC PANEL
ALT: 18 IU/L (ref 0–32)
AST: 11 IU/L (ref 0–40)
Albumin/Globulin Ratio: 1.9 (ref 1.2–2.2)
Albumin: 4.5 g/dL (ref 3.8–4.8)
Alkaline Phosphatase: 55 IU/L (ref 39–117)
BUN/Creatinine Ratio: 17 (ref 9–23)
BUN: 10 mg/dL (ref 6–20)
Bilirubin Total: 0.3 mg/dL (ref 0.0–1.2)
CO2: 22 mmol/L (ref 20–29)
Calcium: 9.3 mg/dL (ref 8.7–10.2)
Chloride: 101 mmol/L (ref 96–106)
Creatinine, Ser: 0.59 mg/dL (ref 0.57–1.00)
GFR calc Af Amer: 136 mL/min/{1.73_m2} (ref >59)
GFR calc non Af Amer: 118 mL/min/{1.73_m2} (ref >59)
Globulin, Total: 2.4 g/dL (ref 1.5–4.5)
Glucose: 98 mg/dL (ref 65–99)
Potassium: 4.4 mmol/L (ref 3.5–5.2)
Sodium: 138 mmol/L (ref 134–144)
Total Protein: 6.9 g/dL (ref 6.0–8.5)

## 2019-02-22 LAB — VITAMIN D 25 HYDROXY (VIT D DEFICIENCY, FRACTURES): Vit D, 25-Hydroxy: 17.5 ng/mL — ABNORMAL LOW (ref 30.0–100.0)

## 2019-02-22 MED ORDER — VITAMIN D (ERGOCALCIFEROL) 1.25 MG (50000 UNIT) PO CAPS: 50000.0000 [IU] | ORAL_CAPSULE | ORAL | 0 refills | Status: DC

## 2019-02-22 MED FILL — VIT D2 1.25 MG (50,000 UNIT: 1.25 MG | 28 days supply | Qty: 4 | Fill #0

## 2019-03-01 ENCOUNTER — Telehealth (INDEPENDENT_AMBULATORY_CARE_PROVIDER_SITE_OTHER): Payer: Self-pay

## 2019-03-01 NOTE — Telephone Encounter (Signed)
-----   Message from Argentina Donovan, [place] sent at 02/22/2019  9:10 AM EST ----- Please call patient and let her know her vitamin D is still  low.  This can contribute to muscle aches, anxiety, fatigue, and depression.  I have sent a prescription to the pharmacy for her to take once a week.  We will recheck this level in 3-4 months.  Blood count, kidney function, liver function, electrolytes and blood sugar are all normal which is reassuring.  Follow-up as planned.  Thanks, Freeman Caldron, PA-C

## 2019-03-01 NOTE — Telephone Encounter (Signed)
Call placed using [employer] interpreter [phone]) patient verified date of birth. She was then informed that her vitamin D is still low.This can contribute to muscle aches, anxiety, fatigue, and depression. I have sent a prescription to the pharmacy for her to take once a week. We will recheck this level in 3-4 months. Blood count, kidney function, liver function, electrolytes and blood sugar are all normal which is reassuring. Follow up as planned. She expressed understanding and did not have any questions. Nat Christen, CMA

## 2019-03-04 ENCOUNTER — Other Ambulatory Visit: Payer: Self-pay

## 2019-03-04 ENCOUNTER — Ambulatory Visit (HOSPITAL_COMMUNITY)
Admission: RE | Admit: 2019-03-04 | Discharge: 2019-03-04 | Disposition: A | Discharge status: Home / Self Care | Payer: Self-pay | Source: Ambulatory Visit | Attending: Physician Assistant | Admitting: Physician Assistant

## 2019-03-04 DIAGNOSIS — R109 Unspecified abdominal pain: Secondary | ICD-10-CM | POA: Insufficient documentation

## 2019-03-07 ENCOUNTER — Telehealth (INDEPENDENT_AMBULATORY_CARE_PROVIDER_SITE_OTHER): Payer: Self-pay

## 2019-03-07 NOTE — Telephone Encounter (Signed)
-----   Message from Argentina Donovan, [place] sent at 03/06/2019  3:09 PM EST ----- Please call patient.  There is nothing on ultrasound that would show a reason for pain.  There is a very small fibroid of the uterus that should not be causing problems.  Follow-up as planned.  Thanks, Freeman Caldron, PA-C

## 2019-03-07 NOTE — Telephone Encounter (Signed)
Call placed using pacific interpreter [phone]) patient verified date of birth. She is aware that there is nothing on her ultrasound that would show a reason for her pain. There is a small fibroid in the uterus that should not be causing problems. Follow up as planned. She expressed understanding of results. She was provided with the number to [facility] to call and schedule appointment. Nat Christen, CMA

## 2019-03-12 ENCOUNTER — Encounter: Payer: Self-pay | Admitting: Urology

## 2019-04-12 ENCOUNTER — Telehealth: Payer: Self-pay | Admitting: Nurse Practitioner

## 2019-04-12 MED FILL — hydrOXYzine HCL 10 MG TABS: 10 | 30 days supply | Qty: 90 | Fill #1

## 2019-04-12 NOTE — Telephone Encounter (Signed)
Refills available in our pharmacy. They are working on it.

## 2019-04-12 NOTE — Telephone Encounter (Signed)
1) Medication(s) Requested (by name): hydrOXYzine (ATARAX/VISTARIL) 10 MG tablet   2) Pharmacy of Choice: [facility] pharmacy   3) Special Requests:   Approved medications will be sent to the pharmacy, we will reach out if there is an issue.  Requests made after 3pm may not be addressed until the following business day!  If a patient is unsure of the name of the medication(s) please note and ask patient to call back when they are able to provide all info, do not send to responsible party until all information is available!

## 2019-04-23 ENCOUNTER — Ambulatory Visit: Payer: Self-pay | Attending: Internal Medicine

## 2019-04-23 DIAGNOSIS — Z20822 Contact with and (suspected) exposure to covid-19: Secondary | ICD-10-CM | POA: Insufficient documentation

## 2019-04-24 LAB — NOVEL CORONAVIRUS, NAA: SARS-CoV-2, NAA: NOT DETECTED

## 2019-05-22 ENCOUNTER — Ambulatory Visit: Payer: Self-pay | Attending: Nurse Practitioner | Admitting: Physician Assistant

## 2019-05-22 ENCOUNTER — Other Ambulatory Visit: Payer: Self-pay

## 2019-05-22 VITALS — BP 124/74 | HR 88 | Temp 98.0°F | Resp 16 | Ht 66.0 in | Wt 189.0 lb

## 2019-05-22 DIAGNOSIS — M545 Low back pain, unspecified: Secondary | ICD-10-CM

## 2019-05-22 DIAGNOSIS — R300 Dysuria: Secondary | ICD-10-CM

## 2019-05-22 DIAGNOSIS — R102 Pelvic and perineal pain: Secondary | ICD-10-CM

## 2019-05-22 DIAGNOSIS — E559 Vitamin D deficiency, unspecified: Secondary | ICD-10-CM

## 2019-05-22 LAB — POCT URINALYSIS DIP (CLINITEK)
Bilirubin, UA: NEGATIVE
Blood, UA: NEGATIVE
Glucose, UA: NEGATIVE mg/dL
Ketones, POC UA: NEGATIVE mg/dL
Leukocytes, UA: NEGATIVE
Nitrite, UA: NEGATIVE
POC PROTEIN,UA: NEGATIVE
Spec Grav, UA: 1.02 (ref 1.010–1.025)
Urobilinogen, UA: 0.2 E.U./dL
pH, UA: 5.5 (ref 5.0–8.0)

## 2019-05-22 LAB — POCT URINE PREGNANCY: Preg Test, Ur: NEGATIVE

## 2019-05-22 MED ORDER — METHOCARBAMOL 500 MG PO TABS: 1000.0000 mg | ORAL_TABLET | Freq: Three times a day (TID) | ORAL | 0 refills | Status: DC | PRN

## 2019-05-22 MED ORDER — DICLOFENAC SODIUM 75 MG PO TBEC: 75.0000 mg | DELAYED_RELEASE_TABLET | Freq: Two times a day (BID) | ORAL | 1 refills | Status: DC

## 2019-05-22 MED FILL — DICLOFENAC SOD EC 75 MG TAB: 75 | 30 days supply | Qty: 60 | Fill #0

## 2019-05-22 MED FILL — METHOCARBAMOL 500 MG TABS: 500 | 15 days supply | Qty: 90 | Fill #0

## 2019-05-22 NOTE — Progress Notes (Signed)
Patient ID: Tracie [name], female   DOB: 03/13/1981, 39 y.o.   MRN: [id number]   Tracie [name], is a 39 y.o. female  UXN:[id number]  [id number]:[id number]  DOB - February 04, 1981  Subjective:  Chief Complaint and HPI: Tracie [name] is a 39 y.o. female here today with lower back pain in the middle of her back.  Some pelvic cramping.   Denies STD risk factors.  Pain has been for about 5 days.  NKI.  +urinary frequency with minimal discomfort.  No fever.  No N/V/D.  No radiating pain into legs.  Not taking anything for the discomfort.    [employer] interpreters "Marianna Fuss" translating.    ROS:   Constitutional:  No f/c, No night sweats, No unexplained weight loss. EENT:  No vision changes, No blurry vision, No hearing changes. No mouth, throat, or ear problems.  Respiratory: No cough, No SOB Cardiac: No CP, no palpitations GI:  No abd pain, No N/V/D. GU: + Urinary s/sx Musculoskeletal: +back pain Neuro: No headache, no dizziness, no motor weakness.  Skin: No rash Endocrine:  No polydipsia. No polyuria.  Psych: Denies SI/HI  No problems updated.  ALLERGIES: No Known Allergies  PAST MEDICAL HISTORY: Past Medical History:  Diagnosis Date  . Headache     MEDICATIONS AT HOME: Prior to Admission medications   Medication Sig Start Date End Date Taking? Authorizing Provider  hydrOXYzine (ATARAX/VISTARIL) 10 MG tablet TAKE 1 TABLET BY MOUTH 3 TIMES DAILY AS NEEDED. 01/08/19  Yes Charlott Rakes, MD  diclofenac (VOLTAREN) 75 MG EC tablet Take 1 tablet (75 mg total) by mouth 2 (two) times daily. Prn pain 05/22/19   Argentina Donovan, PA-C  methocarbamol (ROBAXIN) 500 MG tablet Take 2 tablets (1,000 mg total) by mouth every 8 (eight) hours as needed for muscle spasms. 05/22/19   Argentina Donovan, PA-C  ondansetron (ZOFRAN) 4 MG tablet Take 1 tablet (4 mg total) by mouth every 8 (eight) hours as needed for nausea or vomiting. Patient not taking: Reported on 10/25/2018 07/26/18   Argentina Donovan, PA-C  sertraline (ZOLOFT) 100 MG tablet Take 1 tablet (100 mg total) by mouth daily. Patient not taking: Reported on 02/21/2019 12/01/17   Gildardo Pounds, NP  Vitamin D, Ergocalciferol, (DRISDOL) 1.25 MG (50000 UT) CAPS capsule Take 1 capsule (50,000 Units total) by mouth every 7 (seven) days. Patient not taking: Reported on 05/22/2019 02/22/19   Argentina Donovan, PA-C     Objective:  EXAM:   Vitals:   05/22/19 1344  BP: 124/74  Pulse: 88  Resp: 16  Temp: 98 F (36.7 C)  SpO2: 100%  Weight: 189 lb (85.7 kg)  Height: 5\' 6"  (1.676 m)    General appearance : A&OX3. NAD. Non-toxic-appearing HEENT: Atraumatic and Normocephalic.  PERRLA. EOM intact.  Neck: supple, no JVD. No cervical lymphadenopathy. No thyromegaly Chest/Lungs:  Breathing-non-labored, Good air entry bilaterally, breath sounds normal without rales, rhonchi, or wheezing  CVS: S1 S2 regular, no murmurs, gallops, rubs  Abdomen: Bowel sounds present, Non tender and not distended with no gaurding, rigidity or rebound. Back full S&ROM w/o TTP on spinus processes.  DTR=intact B.  Neg SLR B Extremities: Bilateral Lower Ext shows no edema, both legs are warm to touch with = pulse throughout Neurology:  CN II-XII grossly intact, Non focal.   Psych:  TP linear. J/I WNL. Normal speech. Appropriate eye contact and affect.  Skin:  No Rash  Data Review Lab Results  Component Value Date   HGBA1C  5.6 10/23/2015   HGBA1C 5.5 09/09/2013     Assessment & Plan   1. Dysuria Increase water intake-no infection on dip - POCT URINALYSIS DIP (CLINITEK) - Cervicovaginal ancillary only - Urine Culture  2. Vitamin D deficiency - Vitamin D, 25-hydroxy  3. Midline low back pain without sciatica, unspecified chronicity - diclofenac (VOLTAREN) 75 MG EC tablet; Take 1 tablet (75 mg total) by mouth 2 (two) times daily. Prn pain  Dispense: 60 tablet; Refill: 1 - methocarbamol (ROBAXIN) 500 MG tablet; Take 2 tablets (1,000 mg total) by  mouth every 8 (eight) hours as needed for muscle spasms.  Dispense: 90 tablet; Refill: 0  4. Pelvic pain-self swab and neg urine HCG   Patient have been counseled extensively about nutrition and exercise  Return in about 6 months (around 11/22/2019) for PCP.  The patient was given clear instructions to go to ER or return to medical center if symptoms don't improve, worsen or new problems develop. The patient verbalized understanding. The patient was told to call to get lab results if they haven't heard anything in the next week.     Georgian Co, PA-C [facility] [place], [place] [phone]   05/22/2019, 2:07 PM

## 2019-05-22 NOTE — Patient Instructions (Signed)
Drink 80-100 ounces water daily 

## 2019-05-22 NOTE — Progress Notes (Signed)
c /u urinary discomfort, lower back pain  X 5 days

## 2019-05-23 ENCOUNTER — Other Ambulatory Visit: Payer: Self-pay | Admitting: Physician Assistant

## 2019-05-23 DIAGNOSIS — E559 Vitamin D deficiency, unspecified: Secondary | ICD-10-CM

## 2019-05-23 LAB — CERVICOVAGINAL ANCILLARY ONLY
Bacterial Vaginitis (gardnerella): NEGATIVE
Candida Glabrata: NEGATIVE
Candida Vaginitis: NEGATIVE
Chlamydia: NEGATIVE
Comment: NEGATIVE
Comment: NEGATIVE
Comment: NEGATIVE
Comment: NEGATIVE
Comment: NEGATIVE
Comment: NORMAL
Neisseria Gonorrhea: NEGATIVE
Trichomonas: NEGATIVE

## 2019-05-23 LAB — VITAMIN D 25 HYDROXY (VIT D DEFICIENCY, FRACTURES): Vit D, 25-Hydroxy: 22.9 ng/mL — ABNORMAL LOW (ref 30.0–100.0)

## 2019-05-23 MED ORDER — VITAMIN D (ERGOCALCIFEROL) 1.25 MG (50000 UNIT) PO CAPS: 50000.0000 [IU] | ORAL_CAPSULE | ORAL | 0 refills | Status: DC

## 2019-05-23 MED FILL — ?ERGOCALCIFEROL 50000 UNITC: 1.25 MG | 28 days supply | Qty: 4 | Fill #0

## 2019-05-24 ENCOUNTER — Telehealth (INDEPENDENT_AMBULATORY_CARE_PROVIDER_SITE_OTHER): Payer: Self-pay

## 2019-05-24 LAB — URINE CULTURE: Organism ID, Bacteria: NO GROWTH

## 2019-05-24 NOTE — Telephone Encounter (Signed)
-----   Message from Anders Simmonds, [place] sent at 05/23/2019  2:22 PM EST ----- Urine culture is still pending.  Vaginal swabs were negative for infection of any kind.  Await urine culture.  Thanks, Georgian Co, PA-C

## 2019-05-24 NOTE — Telephone Encounter (Signed)
Call placed to patient using [employer] interpreter [phone]) patient verified date of birth. She is aware that vaginal swabs were negative, no infection or STDs. Patient informed that urine culture did not grow any bacteria. She verbalized understanding. Maryjean Morn, CMA

## 2019-06-04 ENCOUNTER — Other Ambulatory Visit: Payer: Self-pay

## 2019-06-04 ENCOUNTER — Ambulatory Visit: Payer: Self-pay | Attending: Nurse Practitioner

## 2019-06-27 ENCOUNTER — Ambulatory Visit: Payer: Self-pay

## 2019-07-08 ENCOUNTER — Ambulatory Visit: Payer: Self-pay

## 2019-07-18 ENCOUNTER — Other Ambulatory Visit: Payer: Self-pay

## 2019-07-18 ENCOUNTER — Ambulatory Visit: Payer: Self-pay | Attending: Nurse Practitioner | Admitting: Physician Assistant

## 2019-07-18 DIAGNOSIS — R42 Dizziness and giddiness: Secondary | ICD-10-CM

## 2019-07-18 DIAGNOSIS — E559 Vitamin D deficiency, unspecified: Secondary | ICD-10-CM

## 2019-07-18 DIAGNOSIS — F419 Anxiety disorder, unspecified: Secondary | ICD-10-CM

## 2019-07-18 DIAGNOSIS — Z789 Other specified health status: Secondary | ICD-10-CM

## 2019-07-18 DIAGNOSIS — K30 Functional dyspepsia: Secondary | ICD-10-CM

## 2019-07-18 MED ORDER — VITAMIN D (ERGOCALCIFEROL) 1.25 MG (50000 UNIT) PO CAPS: 50000.0000 [IU] | ORAL_CAPSULE | ORAL | 1 refills | Status: DC

## 2019-07-18 MED ORDER — BUSPIRONE HCL 10 MG PO TABS: 10.0000 mg | ORAL_TABLET | Freq: Three times a day (TID) | ORAL | 2 refills | Status: DC

## 2019-07-18 MED FILL — ?BUSPIRONE HCL 10 MG TABLET: 10 | 30 days supply | Qty: 90 | Fill #0

## 2019-07-18 MED FILL — ?ERGOCALCIFEROL 50000 UNITC: 1.25 MG | 28 days supply | Qty: 4 | Fill #0

## 2019-07-18 NOTE — Progress Notes (Signed)
Patient ID: [name], female   DOB: [birth year], 39 y.o.   MRN: [id number] Virtual Visit via Telephone Note  I connected with Tracie [name] on 07/18/19 at  9:30 AM EDT by telephone and verified that I am speaking with the correct person using two identifiers.   I discussed the limitations, risks, security and privacy concerns of performing an evaluation and management service by telephone and the availability of in person appointments. I also discussed with the patient that there may be a patient responsible charge related to this service. The patient expressed understanding and agreed to proceed.  PATIENT visit by telephone virtually in the context of Covid-19 pandemic. Patient location:  home My Location:  [facility] office Persons on the call: me, the patient, and pac[employer] interpreter    History of Present Illness:  Patient is experiencing dizziness and shortness of breath when she eats/related to meal time.  It does feel similar to anxiety but she says it differs than anxiety bc when she feels anxious, she also feels desperate and she is not feeling that way.  This has been going on for about 2 months.  No other symptoms associated other than fatigue.  It starts after breakfast and occurs all day long and is exacerbated at meal times.  Denies CP.  Some upset stomach.  Hydroxyzine does not help.    She took vitamin D once weekly for one month but then threw away the bottle and didn't know she was supposed to pick up a RF.  Not taking zoloft for more than 1 year.    She has had symptoms similar to this in the past and has had extensive bloodwork and workup that has been unremarkable other than low vitamin D    Observations/Objective: NAD.  A&Ox3  Assessment and Plan: 1. Vitamin D deficiency Resume vitamin D bc level=22 1 month ago. - Vitamin D, Ergocalciferol, (DRISDOL) 1.25 MG (50000 UNIT) CAPS capsule; Take 1 capsule (50,000 Units total) by mouth every 7 (seven) days.   Dispense: 4 capsule; Refill: 1  2. Anxiety With extensive blood work being unremarkable with similar longstanding symptoms, I believe this is related to anxiety but she has not wanted to take medication for anxiety.  I will refer to LCSW for counseling.  Emergency s/sx reviewed that she should seek emergent treatment-CP, resp distress, etc.   - busPIRone (BUSPAR) 10 MG tablet; Take 1 tablet (10 mg total) by mouth 3 (three) times daily.  Dispense: 90 tablet; Refill: 2 - Ambulatory referral to Social Work  3. Dizziness Likely related to #2  4. Upset stomach - H. pylori breath test; Future  5. Language barrier [employer] used and additional time performing visit was required.  Follow Up Instructions: See PCP in 1 month   I discussed the assessment and treatment plan with the patient. The patient was provided an opportunity to ask questions and all were answered. The patient agreed with the plan and demonstrated an understanding of the instructions.   The patient was advised to call back or seek an in-person evaluation if the symptoms worsen or if the condition fails to improve as anticipated.  I provided 15 minutes of non-face-to-face time during this encounter.   Georgian Co, PA-C

## 2019-07-22 ENCOUNTER — Ambulatory Visit: Payer: Self-pay | Attending: Nurse Practitioner

## 2019-07-22 ENCOUNTER — Other Ambulatory Visit: Payer: Self-pay

## 2019-07-22 DIAGNOSIS — K30 Functional dyspepsia: Secondary | ICD-10-CM

## 2019-07-23 LAB — H. PYLORI BREATH TEST: H pylori Breath Test: NEGATIVE

## 2019-07-25 ENCOUNTER — Encounter: Payer: Self-pay | Admitting: *Deleted

## 2019-07-26 ENCOUNTER — Telehealth: Payer: Self-pay | Admitting: Licensed Clinical Social Worker

## 2019-07-26 NOTE — Telephone Encounter (Signed)
Call placed to patient utilizing [employer] [facility] RT#[id number]) [facility] appointment scheduled for Wed, May 13

## 2019-07-31 ENCOUNTER — Ambulatory Visit: Payer: Self-pay | Attending: Nurse Practitioner | Admitting: Licensed Clinical Social Worker

## 2019-07-31 ENCOUNTER — Other Ambulatory Visit: Payer: Self-pay

## 2019-07-31 DIAGNOSIS — F411 Generalized anxiety disorder: Secondary | ICD-10-CM

## 2019-07-31 NOTE — BH Specialist Note (Signed)
Integrated Behavioral Health Visit via Telemedicine (Telephone)  07/31/2019 Tracie [name] [id number]   Session Start time: 1:30 PM  Session End time: 1:50 PM Total time: 20  Referring Provider: Trena Platt Type of Visit: Telephonic Patient location: Home [facility] Provider location: Office All persons participating in visit: Yes W, patient, and interpreter [name] [phone]  Confirmed patient's address: Yes  Confirmed patient's phone number: Yes  Any changes to demographics: No   Confirmed patient's insurance: Yes  Any changes to patient's insurance: No   Discussed confidentiality: Yes    The following statements were read to the patient and/or legal guardian that are established with the [facility] Provider.  "The purpose of this phone visit is to provide behavioral health care while limiting exposure to the coronavirus (COVID19).  There is a possibility of technology failure and discussed alternative modes of communication if that failure occurs."  "By engaging in this telephone visit, you consent to the provision of healthcare.  Additionally, you authorize for your insurance to be billed for the services provided during this telephone visit."   Patient and/or legal guardian consented to telephone visit: Yes   PRESENTING CONCERNS: Patient and/or family reports the following symptoms/concerns: Patient reports increase in anxiety symptoms Duration of problem: Patient has been diagnosed with anxiety approximately 4 to 5 years ago.  Patient states that symptoms have increased within the last couple of months; Severity of problem: moderate  STRENGTHS (Protective Factors/Coping Skills): Patient has good insight Patient is taking medication as prescribed   GOALS ADDRESSED: Patient will: 1.  Reduce symptoms of: anxiety  2.  Increase knowledge and/or ability of: coping skills and healthy habits  3.  Demonstrate ability to: Increase healthy adjustment to current life  circumstances and Increase adequate support systems for patient/family  INTERVENTIONS: Interventions utilized:  Mindfulness or Management consultant, Supportive Counseling and Psychoeducation and/or Health Education Standardized Assessments completed: Not Needed  ASSESSMENT: Patient currently experiencing increase in anxiety symptoms.   Patient may benefit from therapy and medication management.  LCSW discussed correlation between physical and mental health, in addition to as can negatively impact both.  Healthy coping skills were discussed. Patient is not interested in therapy at this time  PLAN: 1. Follow up with behavioral health clinician on : LCSW with any additional behavioral health and/or resource needs 2. Behavioral recommendations: Utilize strategies discussed and continue compliance with medications 3. Referral(s): Integrated [employer] (In Clinic)  Bridgett Larsson, [place] 09/06/2019 5:47 PM

## 2019-08-16 ENCOUNTER — Encounter: Payer: Self-pay | Admitting: Nurse Practitioner

## 2019-08-16 ENCOUNTER — Other Ambulatory Visit: Payer: Self-pay

## 2019-08-16 ENCOUNTER — Ambulatory Visit: Payer: Self-pay | Attending: Nurse Practitioner | Admitting: Nurse Practitioner

## 2019-08-16 VITALS — BP 126/79 | HR 108 | Temp 97.7°F | Ht 66.0 in | Wt 177.0 lb

## 2019-08-16 DIAGNOSIS — F419 Anxiety disorder, unspecified: Secondary | ICD-10-CM

## 2019-08-16 DIAGNOSIS — F32A Depression, unspecified: Secondary | ICD-10-CM

## 2019-08-16 DIAGNOSIS — Z13228 Encounter for screening for other metabolic disorders: Secondary | ICD-10-CM

## 2019-08-16 DIAGNOSIS — N943 Premenstrual tension syndrome: Secondary | ICD-10-CM

## 2019-08-16 DIAGNOSIS — F329 Major depressive disorder, single episode, unspecified: Secondary | ICD-10-CM

## 2019-08-16 DIAGNOSIS — H9202 Otalgia, left ear: Secondary | ICD-10-CM

## 2019-08-16 DIAGNOSIS — E559 Vitamin D deficiency, unspecified: Secondary | ICD-10-CM

## 2019-08-16 MED ORDER — SERTRALINE HCL 50 MG PO TABS: 50.0000 mg | ORAL_TABLET | Freq: Every day | ORAL | 3 refills | Status: DC

## 2019-08-16 MED ORDER — NEOMYCIN-POLYMYXIN-HC 3.5-10000-1 OT SOLN: 4.0000 [drp] | Freq: Four times a day (QID) | OTIC | 0 refills | Status: AC

## 2019-08-16 MED FILL — NEO/POLYMYXIN/HC EAR SOLN: 1 | 10 days supply | Qty: 10 | Fill #0

## 2019-08-16 MED FILL — VIT D2 1.25 MG (50,000 UNIT: 1.25 MG | 28 days supply | Qty: 4 | Fill #1

## 2019-08-16 MED FILL — SERTRALINE HCL 50 MG TABLET: 50 | 30 days supply | Qty: 30 | Fill #0

## 2019-08-16 MED FILL — ?ERGOCALCIFEROL 50000 UNITC: 1.25 MG | 28 days supply | Qty: 4 | Fill #1

## 2019-08-16 NOTE — Progress Notes (Signed)
Assessment & Plan:  Tracie [name] was seen today for ear pain.  Diagnoses and all orders for this visit:  Left ear pain -     neomycin-polymyxin-hydrocortisone (CORTISPORIN) OTIC solution; Place 4 drops into the left ear 4 (four) times daily for 10 days.  Vitamin D deficiency disease -     VITAMIN D 25 Hydroxy (Vit-D Deficiency, Fractures)  PMS (premenstrual syndrome) -     sertraline (ZOLOFT) 50 MG tablet; Take 1 tablet (50 mg total) by mouth daily.  Anxiety and depression -     sertraline (ZOLOFT) 50 MG tablet; Take 1 tablet (50 mg total) by mouth daily. Continue hydroxyzine as needed for   Screening for metabolic disorder -     Basic metabolic panel    Patient has been counseled on age-appropriate routine health concerns for screening and prevention. These are reviewed and up-to-date. Referrals have been placed accordingly. Immunizations are up-to-date or declined.    Subjective:   Chief Complaint  Patient presents with  . Ear Pain    Pt. is having itchiness and pain when she touch her inside her left ear .    HPI Tracie [name] 39 y.o. female presents to office today with complaints of left ear pain.  VRI was used to communicate directly with patient for the entire encounter including providing detailed patient instructions.    Left Ear Pain Onset 2 weeks ago. She has been using peroxide and qtips in her ear once it started itching. Now irritated and painful.     Anxiety She has been on Celexa, Paxil, Effexor-xr, Zoloft and Prozac in the past. She notes increased PMS symptoms of anxiety and irritability for 10 days during her menstrual cycle. Will try her on zoloft daily for monthly PMS symptoms.     Review of Systems  Constitutional: Negative for fever, malaise/fatigue and weight loss.  HENT: Positive for ear pain. Negative for nosebleeds.   Eyes: Negative.  Negative for blurred vision, double vision and photophobia.  Respiratory: Negative.  Negative for cough  and shortness of breath.   Cardiovascular: Negative.  Negative for chest pain, palpitations and leg swelling.  Gastrointestinal: Negative.  Negative for heartburn, nausea and vomiting.  Musculoskeletal: Negative.  Negative for myalgias.  Neurological: Negative.  Negative for dizziness, focal weakness, seizures and headaches.  Psychiatric/Behavioral: Negative for suicidal ideas. The patient is nervous/anxious.     Past Medical History:  Diagnosis Date  . Headache     History reviewed. No pertinent surgical history.  Family History  Problem Relation Age of Onset  . Hypertension Neg Hx     Social History Reviewed with no changes to be made today.   Outpatient Medications Prior to Visit  Medication Sig Dispense Refill  . hydrOXYzine (ATARAX/VISTARIL) 10 MG tablet TAKE 1 TABLET BY MOUTH 3 TIMES DAILY AS NEEDED. 90 tablet 2  . Vitamin D, Ergocalciferol, (DRISDOL) 1.25 MG (50000 UNIT) CAPS capsule Take 1 capsule (50,000 Units total) by mouth every 7 (seven) days. 4 capsule 1  . ondansetron (ZOFRAN) 4 MG tablet Take 1 tablet (4 mg total) by mouth every 8 (eight) hours as needed for nausea or vomiting. (Patient not taking: Reported on 10/25/2018) 20 tablet 0  . busPIRone (BUSPAR) 10 MG tablet Take 1 tablet (10 mg total) by mouth 3 (three) times daily. (Patient not taking: Reported on 08/16/2019) 90 tablet 2  . diclofenac (VOLTAREN) 75 MG EC tablet Take 1 tablet (75 mg total) by mouth 2 (two) times daily. Prn pain (Patient  not taking: Reported on 07/18/2019) 60 tablet 1  . methocarbamol (ROBAXIN) 500 MG tablet Take 2 tablets (1,000 mg total) by mouth every 8 (eight) hours as needed for muscle spasms. (Patient not taking: Reported on 07/18/2019) 90 tablet 0   No facility-administered medications prior to visit.    No Known Allergies     Objective:    BP 126/79 (BP Location: Left Arm, Patient Position: Sitting, Cuff Size: Normal)   Pulse (!) 108   Temp 97.7 F (36.5 C) (Temporal)   Ht 5\' 6"   (1.676 m)   Wt 177 lb (80.3 kg)   LMP 07/22/2019   SpO2 100%   BMI 28.57 kg/m  Wt Readings from Last 3 Encounters:  08/16/19 177 lb (80.3 kg)  05/22/19 189 lb (85.7 kg)  02/21/19 181 lb (82.1 kg)    Physical Exam Vitals and nursing note reviewed.  Constitutional:      Appearance: She is well-developed.  HENT:     Head: Normocephalic and atraumatic.     Ears:     Comments: Left tympanic membrane occluded by copious thick appearing wax and debris. After irrigation left ear noted for  Cardiovascular:     Rate and Rhythm: Regular rhythm. Tachycardia present.     Heart sounds: Normal heart sounds. No murmur. No friction rub. No gallop.   Pulmonary:     Effort: Pulmonary effort is normal. No tachypnea or respiratory distress.     Breath sounds: Normal breath sounds. No decreased breath sounds, wheezing, rhonchi or rales.  Chest:     Chest wall: No tenderness.  Abdominal:     General: Bowel sounds are normal.     Palpations: Abdomen is soft.  Musculoskeletal:        General: Normal range of motion.     Cervical back: Normal range of motion.  Skin:    General: Skin is warm and dry.  Neurological:     Mental Status: She is alert and oriented to person, place, and time.     Coordination: Coordination normal.  Psychiatric:        Behavior: Behavior normal. Behavior is cooperative.        Thought Content: Thought content normal.        Judgment: Judgment normal.          Patient has been counseled extensively about nutrition and exercise as well as the importance of adherence with medications and regular follow-up. The patient was given clear instructions to go to ER or return to medical center if symptoms don't improve, worsen or new problems develop. The patient verbalized understanding.   Follow-up: Return in about 4 weeks (around 09/13/2019), or if symptoms worsen or fail to improve.   09/15/2019, FNP-BC [facility] [place],  [place]   08/16/2019, 3:22 PM

## 2019-08-17 LAB — BASIC METABOLIC PANEL
BUN/Creatinine Ratio: 22 (ref 9–23)
BUN: 11 mg/dL (ref 6–20)
CO2: 22 mmol/L (ref 20–29)
Calcium: 9.3 mg/dL (ref 8.7–10.2)
Chloride: 105 mmol/L (ref 96–106)
Creatinine, Ser: 0.5 mg/dL — ABNORMAL LOW (ref 0.57–1.00)
GFR calc Af Amer: 143 mL/min/{1.73_m2} (ref >59)
GFR calc non Af Amer: 124 mL/min/{1.73_m2} (ref >59)
Glucose: 114 mg/dL — ABNORMAL HIGH (ref 65–99)
Potassium: 3.9 mmol/L (ref 3.5–5.2)
Sodium: 140 mmol/L (ref 134–144)

## 2019-08-17 LAB — VITAMIN D 25 HYDROXY (VIT D DEFICIENCY, FRACTURES): Vit D, 25-Hydroxy: 28.2 ng/mL — ABNORMAL LOW (ref 30.0–100.0)

## 2019-09-12 ENCOUNTER — Ambulatory Visit: Payer: Self-pay | Attending: Internal Medicine

## 2019-09-12 DIAGNOSIS — Z23 Encounter for immunization: Secondary | ICD-10-CM

## 2019-09-12 NOTE — Progress Notes (Signed)
   Covid-19 Vaccination Clinic  Name:  Tracie [name]    MRN: [id number] DOB: 1980/07/26  09/12/2019  Tracie [name] was observed post Covid-19 immunization for 15 minutes without incident. She was provided with Vaccine Information Sheet and instruction to access the V-Safe system.   Tracie [name] was instructed to call 911 with any severe reactions post vaccine: [name] Kitchen Difficulty breathing  . Swelling of face and throat  . A fast heartbeat  . A bad rash all over body  . Dizziness and weakness   Immunizations Administered    Name Date Dose VIS Date Route   Pfizer COVID-19 Vaccine 09/12/2019 10:58 AM 0.3 mL 05/15/2018 Intramuscular   Manufacturer: [employer], [employer]   Lot: [id number]   NDC: [address]-2

## 2019-10-03 ENCOUNTER — Ambulatory Visit: Payer: Self-pay | Attending: Internal Medicine

## 2019-10-03 DIAGNOSIS — Z23 Encounter for immunization: Secondary | ICD-10-CM

## 2019-10-03 NOTE — Progress Notes (Signed)
   Covid-19 Vaccination Clinic  Name:  Tracie [name]    MRN: [id number] DOB: [birth year]  10/03/2019  [name] was observed post Covid-19 immunization for 15 minutes without incident. She was provided with Vaccine Information Sheet and instruction to access the V-Safe system.   [name] was instructed to call 911 with any severe reactions post vaccine: [name] Kitchen Difficulty breathing  . Swelling of face and throat  . A fast heartbeat  . A bad rash all over body  . Dizziness and weakness   Immunizations Administered    Name Date Dose VIS Date Route   Pfizer COVID-19 Vaccine 10/03/2019  9:54 AM 0.3 mL 05/15/2018 Intramuscular   Manufacturer: [employer], [employer]   Lot: [id number]   NDC: [address]-0

## 2019-10-16 ENCOUNTER — Telehealth: Payer: Self-pay | Admitting: Nurse Practitioner

## 2019-10-16 NOTE — Telephone Encounter (Signed)
Please advise.  Copied from CRM [id number]. Topic: Referral - Request for Referral >> Oct 15, 2019  9:53 AM Lyn Hollingshead D wrote: Has patient seen PCP for this complaint? Yes *If NO, is insurance requiring patient see PCP for this issue before PCP can refer them? Referral for which specialty: Opthalmology  Preferred provider/office: N/A Reason for referral: PT request

## 2019-10-17 NOTE — Telephone Encounter (Signed)
What is the ophthalmology request related to? Is she insured? If not she does not require a referral.

## 2019-10-17 NOTE — Telephone Encounter (Signed)
Will route to PCP for ophthalmology referral request.

## 2019-10-24 ENCOUNTER — Other Ambulatory Visit: Payer: Self-pay

## 2019-10-24 ENCOUNTER — Encounter (HOSPITAL_COMMUNITY): Payer: Self-pay | Admitting: Obstetrics & Gynecology

## 2019-10-24 ENCOUNTER — Inpatient Hospital Stay (HOSPITAL_COMMUNITY): Payer: Self-pay

## 2019-10-24 ENCOUNTER — Inpatient Hospital Stay (HOSPITAL_COMMUNITY)
Admission: AD | Admit: 2019-10-24 | Discharge: 2019-10-24 | Disposition: A | Discharge status: Home / Self Care | Payer: Self-pay | Attending: Obstetrics & Gynecology | Admitting: Obstetrics & Gynecology

## 2019-10-24 DIAGNOSIS — Z3490 Encounter for supervision of normal pregnancy, unspecified, unspecified trimester: Secondary | ICD-10-CM

## 2019-10-24 DIAGNOSIS — Z79899 Other long term (current) drug therapy: Secondary | ICD-10-CM | POA: Insufficient documentation

## 2019-10-24 DIAGNOSIS — N898 Other specified noninflammatory disorders of vagina: Secondary | ICD-10-CM | POA: Insufficient documentation

## 2019-10-24 DIAGNOSIS — O26891 Other specified pregnancy related conditions, first trimester: Secondary | ICD-10-CM | POA: Insufficient documentation

## 2019-10-24 DIAGNOSIS — O99891 Other specified diseases and conditions complicating pregnancy: Secondary | ICD-10-CM | POA: Insufficient documentation

## 2019-10-24 DIAGNOSIS — R300 Dysuria: Secondary | ICD-10-CM | POA: Insufficient documentation

## 2019-10-24 DIAGNOSIS — O26899 Other specified pregnancy related conditions, unspecified trimester: Secondary | ICD-10-CM

## 2019-10-24 DIAGNOSIS — Z3A01 Less than 8 weeks gestation of pregnancy: Secondary | ICD-10-CM | POA: Insufficient documentation

## 2019-10-24 DIAGNOSIS — R109 Unspecified abdominal pain: Secondary | ICD-10-CM | POA: Insufficient documentation

## 2019-10-24 LAB — WET PREP, GENITAL
Clue Cells Wet Prep HPF POC: NONE SEEN
Sperm: NONE SEEN
Trich, Wet Prep: NONE SEEN
Yeast Wet Prep HPF POC: NONE SEEN

## 2019-10-24 LAB — URINALYSIS, ROUTINE W REFLEX MICROSCOPIC
Bilirubin Urine: NEGATIVE
Glucose, UA: NEGATIVE mg/dL
Hgb urine dipstick: NEGATIVE
Ketones, ur: NEGATIVE mg/dL
Leukocytes,Ua: NEGATIVE
Nitrite: NEGATIVE
Protein, ur: NEGATIVE mg/dL
Specific Gravity, Urine: 1.014 (ref 1.005–1.030)
pH: 6 (ref 5.0–8.0)

## 2019-10-24 LAB — CBC
HCT: 37.2 % (ref 36.0–46.0)
Hemoglobin: 12 g/dL (ref 12.0–15.0)
MCH: 26.6 pg (ref 26.0–34.0)
MCHC: 32.3 g/dL (ref 30.0–36.0)
MCV: 82.5 fL (ref 80.0–100.0)
Platelets: 299 10*3/uL (ref 150–400)
RBC: 4.51 MIL/uL (ref 3.87–5.11)
RDW: 13.3 % (ref 11.5–15.5)
WBC: 8.4 10*3/uL (ref 4.0–10.5)
nRBC: 0 % (ref 0.0–0.2)

## 2019-10-24 LAB — ABO/RH: ABO/RH(D): O POS

## 2019-10-24 LAB — HCG, QUANTITATIVE, PREGNANCY: hCG, Beta Chain, Quant, S: 14630 m[IU]/mL — ABNORMAL HIGH (ref <5)

## 2019-10-24 NOTE — Discharge Instructions (Signed)
Dolor abdominal durante el embarazo Abdominal Pain During Pregnancy  El dolor de vientre (abdominal) es habitual durante el embarazo. Hay muchas causas posibles. Generalmente, no se trata de un problema grave. Otras veces puede ser un signo de que algo no anda bien. Siempre comunquese con su mdico si tiene dolor abdominal. Siga estas indicaciones en su casa:  No tenga relaciones sexuales ni se coloque nada dentro de la vagina hasta que el dolor haya desaparecido completamente.  Descanse todo lo que pueda hasta que el dolor se le haya calmado.  Beba suficiente lquido como para mantener el pis (orina) de color amarillo plido.  Tome los medicamentos de venta libre y los recetados solamente como se lo haya indicado el mdico.  Concurra a todas las visitas de control como se lo haya indicado el mdico. Esto es importante. Comunquese con un mdico si:  El dolor contina o empeora despus de descansar.  Siente dolor en la parte inferior del abdomen que: ? Va y viene en intervalos regulares. ? Se extiende a la espalda. ? Se siente como clicos menstruales.  Siente dolor o ardor al orinar. Solicite ayuda de inmediato si:  Tiene fiebre o siente escalofros.  Tiene una hemorragia vaginal abundante.  Tiene una prdida de lquido por la vagina.  Elimina tejidos por la vagina.  Vomita durante ms de 24horas.  Tiene heces lquidas (diarrea) durante ms de 24horas.  El beb se mueve menos de lo habitual.  Se siente dbil o se desmaya.  Le falta el aire.  Tiene dolor muy intenso en la parte superior del abdomen. Resumen  El dolor de vientre (abdominal) es habitual durante el embarazo. Hay muchas causas posibles.  Si tiene dolor en el abdomen durante el embarazo, avise al mdico de inmediato.  Concurra a todas las visitas de control como se lo haya indicado el mdico. Esto es importante. Esta informacin no tiene como fin reemplazar el consejo del mdico. Asegrese de  hacerle al mdico cualquier pregunta que tenga. Document Revised: 08/30/2016 Document Reviewed: 08/30/2016 Elsevier Patient Education  2020 Elsevier Inc.  Las medicinas seguras para tomar durante el embarazo  Safe Medications in Pregnancy  Acn:  Benzoyl Peroxide (Perxido de benzolo)  Salicylic Acid (cido saliclico)  Dolor de espalda/Dolor de cabeza:  Tylenol: 2 pastillas de concentracin regular cada 4 horas O 2 pastillas de concentracin fuerte cada 6 horas  Resfriados/Tos/Alergias:  Benadryl (sin alcohol) 25 mg cada 6 horas segn lo necesite Breath Right strips (Tiras para respirar correctamente)  Claritin  Cepacol (pastillas de chupar para la garganta)  Chloraseptic (aerosol para la garganta)  Cold-Eeze- hasta tres veces por da  Cough drops (pastillas de chupar para la tos, sin alcohol)  Flonase (con receta mdica solamente)  Guaifenesin  Mucinex  Robitussin DM (simple solamente, sin alcohol)  Saline nasal spray/drops (Aerosol nasal salino/gotas) Sudafed (pseudoephedrine) y  Actifed * utilizar slo despus de 12 semanas de gestacin y si no tiene la presin arterial alta.  Tylenol Vicks  VapoRub  Zinc lozenges (pastillas para la garganta)  Zyrtec  Estreimiento:  Colace  Ducolax (supositorios)  Fleet enema (lavado intestinal rectal)  Glycerin (supositorios)  Metamucil  Milk of magnesia (leche de magnesia)  Miralax  Senokot  Smooth Move (t)  Diarrea:  Kaopectate Imodium A-D  *NO tome Pepto-Bismol  Hemorroides:  Anusol  Anusol HC  Preparation H  Tucks  Indigestin:  Tums  Maalox  Mylanta  Zantac  Pepcid  Insomnia:  Benadryl (sin alcohol) 25mg cada 6 horas segn   lo necesite  Tylenol PM  Unisom, no Gelcaps  Calambres en las piernas:  Tums  MagGel Nuseas/Vmitos:  Bonine  Dramamine  Emetrol  Ginger (extracto)  Sea-Bands  Meclizine  Medicina para las nuseas que puede tomar durante el embarazo: Unisom (doxylamine succinate, pastillas de 25  mg) Tome una pastilla al da al acostarse. Si los sntomas no estn adecuadamente controlados, la dosis puede aumentarse hasta una dosis mxima recomendada de dos pastillas al da (1/2 pastilla por la maana, 1/2 pastilla a media tarde y una pastilla al acostarse). Pastillas de Vitamina B6 de 100mg. Tome una pastilla dos veces al da (hasta 200 mg por da).  Erupciones en la piel:  Productos de Aveeno  Benadryl cream (crema o una dosis de 25mg cada 6 horas segn lo necesite)  Calamine Lotion (locin)  1% cortisone cream (crema de cortisona de 1%)  nfeccin vaginal por hongos (candidiasis):  Gyne-lotrimin 7  Monistat 7   **Si est tomando varias medicinas, por favor revise las etiquetas para evitar duplicar los mismos ingredientes activos. **Tome la medicina segn lo indicado en la etiqueta. **No tome ms de 400 mg de Tylenol en 24 horas. **No tome medicinas que contengan aspirina o ibuprofeno.     

## 2019-10-24 NOTE — MAU Provider Note (Signed)
History     CSN: [id number]  Arrival date and time: 10/24/19 2043   First Provider Initiated Contact with Patient 10/24/19 2129      Chief Complaint  Patient presents with  . Abdominal Pain   39 y.o. G2P1001 @[redacted]w[redacted]d  by sure LMP presenting with LAP and vaginal discharge. Sx started 2 days ago. LAP is bilateral but worse on right. Describes as cramping, rates 6/10. Has not tried anything for it. Reports yellow vaginal discharge with itching. Reports dysuria but denies other urinary sx. No fevers.   OB History    Gravida  2   Para  1   Term  1   Preterm      AB      Living  1     SAB  0   TAB  0   Ectopic      Multiple      Live Births  1           Past Medical History:  Diagnosis Date  . Headache     No past surgical history on file.  Family History  Problem Relation Age of Onset  . Hypertension Neg Hx     Social History   Tobacco Use  . Smoking status: Never Smoker  . Smokeless tobacco: Never Used  Vaping Use  . Vaping Use: Never used  Substance Use Topics  . Alcohol use: No  . Drug use: No    Allergies: No Known Allergies  No medications prior to admission.    Review of Systems  Constitutional: Negative for chills and fever.  Gastrointestinal: Positive for abdominal pain and nausea. Negative for constipation, diarrhea and vomiting.  Genitourinary: Positive for dysuria and vaginal discharge. Negative for frequency and vaginal bleeding.  Musculoskeletal: Positive for back pain.   Physical Exam   Blood pressure 120/69, pulse 88, temperature 99.1 F (37.3 C), temperature source Oral, resp. rate 20, height 5\' 4"  (1.626 m), weight 77.7 kg, last menstrual period 09/16/2019.  Physical Exam Vitals and nursing note reviewed. Exam conducted with a chaperone present.  Constitutional:      General: She is not in acute distress. HENT:     Head: Normocephalic and atraumatic.  Pulmonary:     Effort: Pulmonary effort is normal. No respiratory  distress.  Abdominal:     General: There is no distension.     Palpations: Abdomen is soft. There is no mass.     Tenderness: There is no abdominal tenderness.     Hernia: No hernia is present.  Genitourinary:    Comments: External: no lesions or erythema Vagina: rugated, pink, moist, thin white discharge Uterus: non enlarged, anteverted, non tender, no CMT Adnexae: no masses, no tenderness left, no tenderness right Cervix closed, ectropion and nabothian present  Musculoskeletal:        General: Normal range of motion.  Skin:    General: Skin is warm and dry.  Neurological:     General: No focal deficit present.     Mental Status: She is alert and oriented to person, place, and time.  Psychiatric:        Mood and Affect: Mood normal.    Results for orders placed or performed during the hospital encounter of 10/24/19 (from the past 24 hour(s))  Urinalysis, Routine w reflex microscopic Urine, Clean Catch     Status: Abnormal   Collection Time: 10/24/19  8:52 PM  Result Value Ref Range   Color, Urine YELLOW YELLOW   APPearance HAZY (  A) CLEAR   Specific Gravity, Urine 1.014 1.005 - 1.030   pH 6.0 5.0 - 8.0   Glucose, UA NEGATIVE NEGATIVE mg/dL   Hgb urine dipstick NEGATIVE NEGATIVE   Bilirubin Urine NEGATIVE NEGATIVE   Ketones, ur NEGATIVE NEGATIVE mg/dL   Protein, ur NEGATIVE NEGATIVE mg/dL   Nitrite NEGATIVE NEGATIVE   Leukocytes,Ua NEGATIVE NEGATIVE  ABO/Rh     Status: None   Collection Time: 10/24/19  9:49 PM  Result Value Ref Range   ABO/RH(D) O POS    No rh immune globuloin      NOT A RH IMMUNE GLOBULIN CANDIDATE, PT RH POSITIVE Performed at [facility], [address], [place], [place] [address]   CBC     Status: None   Collection Time: 10/24/19  9:49 PM  Result Value Ref Range   WBC 8.4 4.0 - 10.5 K/uL   RBC 4.51 3.87 - 5.11 MIL/uL   Hemoglobin 12.0 12.0 - 15.0 g/dL   HCT 46.9 36 - 46 %   MCV 82.5 80.0 - 100.0 fL   MCH 26.6 26.0 - 34.0 pg   MCHC 32.3  30.0 - 36.0 g/dL   RDW 62.9 52.8 - 41.3 %   Platelets 299 150 - 400 K/uL   nRBC 0.0 0.0 - 0.2 %  hCG, quantitative, pregnancy     Status: Abnormal   Collection Time: 10/24/19  9:49 PM  Result Value Ref Range   hCG, Beta Chain, Quant, S 14,630 (H) <5 mIU/mL  Wet prep, genital     Status: Abnormal   Collection Time: 10/24/19  9:53 PM  Result Value Ref Range   Yeast Wet Prep HPF POC NONE SEEN NONE SEEN   Trich, Wet Prep NONE SEEN NONE SEEN   Clue Cells Wet Prep HPF POC NONE SEEN NONE SEEN   WBC, Wet Prep HPF POC MODERATE (A) NONE SEEN   Sperm NONE SEEN     US OB LESS THAN 14 WEEKS WITH OB TRANSVAGINAL  Result Date: 10/24/2019 CLINICAL DATA:  Lower abdominal pain EXAM: OBSTETRIC <14 WK [place] AND TRANSVAGINAL OB US TECHNIQUE: Both transabdominal and transvaginal ultrasound examinations were performed for complete evaluation of the gestation as well as the maternal uterus, adnexal regions, and pelvic cul-de-sac. Transvaginal technique was performed to assess early pregnancy. COMPARISON:  None. FINDINGS: Intrauterine gestational sac: Single Yolk sac:  Visualized. Embryo:  Not Visualized. Cardiac Activity: Not Visualized. MSD: 1.12 mm   5 w   6 d Subchorionic hemorrhage:  None visualized. Maternal uterus/adnexae: Small fibroids are noted measuring up to approximately 1.2 cm. The ovaries are grossly unremarkable aside from a dominant follicle within the left ovary. There is no significant free fluid in the patient's pelvis. IMPRESSION: Probable early intrauterine gestational sac, but no fetal pole or cardiac activity yet visualized. Recommend follow-up quantitative B-HCG levels and follow-up US in 14 days to assess viability. This recommendation follows SRU consensus guidelines: Diagnostic Criteria for Nonviable Pregnancy Early in the First Trimester. Malva Limes Med 2013; 244:0102-72. Electronically Signed   By: Katherine Mantle M.D.   On: 10/24/2019 22:14   MAU Course  Procedures  MDM Labs and [place]  ordered and reviewed. Early pregnancy identified on [place]. No acute process. Pain likely physiologic to early pregnancy. GC pending. Stable for discharge home.   Assessment and Plan   1. Early stage of pregnancy   2. Abdominal pain affecting pregnancy    Discharge home Follow up at [facility] to start care SAB  precautions Tylenol/heat prn Safe meds list provided  Allergies as of 10/24/2019   No Known Allergies     Medication List    STOP taking these medications   hydrOXYzine 10 MG tablet Commonly known as: ATARAX/VISTARIL     TAKE these medications   ondansetron 4 MG tablet Commonly known as: ZOFRAN Take 1 tablet (4 mg total) by mouth every 8 (eight) hours as needed for nausea or vomiting.   sertraline 50 MG tablet Commonly known as: ZOLOFT Take 1 tablet (50 mg total) by mouth daily.   Vitamin D (Ergocalciferol) 1.25 MG (50000 UNIT) Caps capsule Commonly known as: DRISDOL Take 1 capsule (50,000 Units total) by mouth every 7 (seven) days.      Live interpreter present for encounter  Donette Larry, CNM 10/25/2019, 12:35 AM

## 2019-10-24 NOTE — Telephone Encounter (Signed)
Spoke to patient and informed she don't have insurance, she would need to pay pocket. Pt. Is aware.  CMA will give patient a list of ophthalmology places she can go to and compare the price.

## 2019-10-24 NOTE — MAU Note (Signed)
PT SAYS HAS LOWER ABD PAIN - STARTED Tuesday MORN.  NO VAG BLEEDING. - NO MEDS FOR PAIN.  WENT TO HD ON 10-22-19- POSITIVE UPT.

## 2019-10-27 LAB — GC/CHLAMYDIA PROBE AMP (~~LOC~~) NOT AT ARMC
Chlamydia: NEGATIVE
Comment: NEGATIVE
Comment: NORMAL
Neisseria Gonorrhea: NEGATIVE

## 2019-11-04 ENCOUNTER — Encounter (HOSPITAL_COMMUNITY): Payer: Self-pay | Admitting: Obstetrics and Gynecology

## 2019-11-04 ENCOUNTER — Inpatient Hospital Stay (HOSPITAL_COMMUNITY)
Admission: AD | Admit: 2019-11-04 | Discharge: 2019-11-04 | Disposition: A | Discharge status: Home / Self Care | Payer: Self-pay | Attending: Obstetrics and Gynecology | Admitting: Obstetrics and Gynecology

## 2019-11-04 ENCOUNTER — Other Ambulatory Visit: Payer: Self-pay

## 2019-11-04 DIAGNOSIS — Z3491 Encounter for supervision of normal pregnancy, unspecified, first trimester: Secondary | ICD-10-CM

## 2019-11-04 DIAGNOSIS — Z3A01 Less than 8 weeks gestation of pregnancy: Secondary | ICD-10-CM

## 2019-11-04 DIAGNOSIS — O99891 Other specified diseases and conditions complicating pregnancy: Secondary | ICD-10-CM

## 2019-11-04 DIAGNOSIS — N930 Postcoital and contact bleeding: Secondary | ICD-10-CM

## 2019-11-04 DIAGNOSIS — O26891 Other specified pregnancy related conditions, first trimester: Secondary | ICD-10-CM | POA: Insufficient documentation

## 2019-11-04 DIAGNOSIS — O209 Hemorrhage in early pregnancy, unspecified: Secondary | ICD-10-CM | POA: Insufficient documentation

## 2019-11-04 DIAGNOSIS — R1030 Lower abdominal pain, unspecified: Secondary | ICD-10-CM | POA: Insufficient documentation

## 2019-11-04 DIAGNOSIS — Z79899 Other long term (current) drug therapy: Secondary | ICD-10-CM | POA: Insufficient documentation

## 2019-11-04 DIAGNOSIS — O09521 Supervision of elderly multigravida, first trimester: Secondary | ICD-10-CM | POA: Insufficient documentation

## 2019-11-04 LAB — URINALYSIS, ROUTINE W REFLEX MICROSCOPIC
Bilirubin Urine: NEGATIVE
Glucose, UA: NEGATIVE mg/dL
Ketones, ur: NEGATIVE mg/dL
Nitrite: NEGATIVE
Protein, ur: NEGATIVE mg/dL
Specific Gravity, Urine: 1.023 (ref 1.005–1.030)
pH: 6 (ref 5.0–8.0)

## 2019-11-04 NOTE — MAU Provider Note (Signed)
History     CSN: [id number]  Arrival date and time: 11/04/19 6160   First Provider Initiated Contact with Patient 11/04/19 [phone]      Chief Complaint  Patient presents with  . Vaginal Bleeding  . Abdominal Pain   HPI Tracie [name] is a 39 y.o. G2P1001 at [redacted]w[redacted]d who presents with abdominal pain and vaginal bleeding. She states the abdominal pain has been ongoing for several weeks and is unchanged. She rates the pain a 3/10 and has not taken anything for the pain. She also reports having intercourse last night and seeing some spotting this morning when she wiped. She denies any active bleeding or clots. She was seen in MAU on 8/5 and confirmed an IUP with a gestational sac and yolk sac.  OB History    Gravida  2   Para  1   Term  1   Preterm      AB      Living  1     SAB  0   TAB  0   Ectopic      Multiple      Live Births  1           Past Medical History:  Diagnosis Date  . Headache     History reviewed. No pertinent surgical history.  Family History  Problem Relation Age of Onset  . Hypertension Neg Hx     Social History   Tobacco Use  . Smoking status: Never Smoker  . Smokeless tobacco: Never Used  Vaping Use  . Vaping Use: Never used  Substance Use Topics  . Alcohol use: No  . Drug use: No    Allergies: No Known Allergies  Medications Prior to Admission  Medication Sig Dispense Refill Last Dose  . Prenatal Vit-Fe Fumarate-FA (MULTIVITAMIN-PRENATAL) 27-0.8 MG TABS tablet Take 1 tablet by mouth daily at 12 noon.   11/03/2019 at Unknown time  . ondansetron (ZOFRAN) 4 MG tablet Take 1 tablet (4 mg total) by mouth every 8 (eight) hours as needed for nausea or vomiting. (Patient not taking: Reported on 10/25/2018) 20 tablet 0   . sertraline (ZOLOFT) 50 MG tablet Take 1 tablet (50 mg total) by mouth daily. 30 tablet 3 More than a month at Unknown time  . Vitamin D, Ergocalciferol, (DRISDOL) 1.25 MG (50000 UNIT) CAPS capsule Take 1 capsule  (50,000 Units total) by mouth every 7 (seven) days. 4 capsule 1 More than a month at Unknown time    Review of Systems  Constitutional: Negative.  Negative for fatigue and fever.  HENT: Negative.   Respiratory: Negative.  Negative for shortness of breath.   Cardiovascular: Negative.  Negative for chest pain.  Gastrointestinal: Positive for abdominal pain. Negative for constipation, diarrhea, nausea and vomiting.  Genitourinary: Positive for vaginal bleeding. Negative for dysuria and vaginal discharge.  Neurological: Negative.  Negative for dizziness and headaches.   Physical Exam   Blood pressure 129/86, pulse (!) 104, temperature 98.8 F (37.1 C), temperature source Oral, resp. rate 18, height 5\' 4"  (1.626 m), weight 79.5 kg, last menstrual period 09/16/2019.  Physical Exam Vitals and nursing note reviewed.  Constitutional:      General: She is not in acute distress.    Appearance: She is well-developed.  HENT:     Head: Normocephalic.  Eyes:     Pupils: Pupils are equal, round, and reactive to light.  Cardiovascular:     Rate and Rhythm: Normal rate and regular rhythm.  Heart sounds: Normal heart sounds.  Pulmonary:     Effort: Pulmonary effort is normal. No respiratory distress.     Breath sounds: Normal breath sounds.  Abdominal:     General: Bowel sounds are normal. There is no distension.     Palpations: Abdomen is soft.     Tenderness: There is no abdominal tenderness.  Genitourinary:    Comments: SSE: Pelvic exam: Cervix pink, visually closed, without lesion, scant white creamy discharge, vaginal walls and external genitalia normal Skin:    General: Skin is warm and dry.  Neurological:     Mental Status: She is alert and oriented to person, place, and time.  Psychiatric:        Behavior: Behavior normal.        Thought Content: Thought content normal.        Judgment: Judgment normal.     MAU Course  Procedures  MDM UA Pt informed that the ultrasound  is considered a limited OB ultrasound and is not intended to be a complete ultrasound exam.  Patient also informed that the ultrasound is not being completed with the intent of assessing for fetal or placental anomalies or any pelvic abnormalities.  Explained that the purpose of today's ultrasound is to assess for viability. 6 week fetus with FHR 146 bpm.  Patient acknowledges the purpose of the exam and the limitations of the study.    Reassured of normalcy of bleeding after intercourse. Recommended pelvic rest for 7 days.   Assessment and Plan   1. PCB (post coital bleeding)   2. [redacted] weeks gestation of pregnancy   3. Normal intrauterine pregnancy on prenatal ultrasound in first trimester    -Discharge home in stable condition -First trimester precautions discussed -Patient advised to follow-up with OB to establish prenatal care -Patient may return to MAU as needed or if her condition were to change or worsen   Rolm Bookbinder CNM 11/04/2019, 7:07 AM

## 2019-11-04 NOTE — Discharge Instructions (Signed)

## 2019-11-04 NOTE — MAU Note (Signed)
..  Tracie [name] is a 39 y.o. at [redacted]w[redacted]d here in MAU reporting: vaginal bleeding when she wiped and some lower abdominal pain. Last intercourse was last night.   Onset of complaint: abdominal pain began last week for which she was seen in MAU. The vaginal bleeding began this morning.  Pain score: 7/10 Vitals:   11/04/19 0648 11/04/19 0649  BP:  129/86  Pulse:  (!) 104  Resp: 18   Temp: 98.8 F (37.1 C)       Lab orders placed from triage: UA

## 2019-11-20 ENCOUNTER — Inpatient Hospital Stay (HOSPITAL_COMMUNITY)
Admission: AD | Admit: 2019-11-20 | Discharge: 2019-11-20 | Disposition: A | Discharge status: Home / Self Care | Payer: Self-pay | Attending: Obstetrics & Gynecology | Admitting: Obstetrics & Gynecology

## 2019-11-20 ENCOUNTER — Encounter (HOSPITAL_COMMUNITY): Payer: Self-pay | Admitting: Obstetrics & Gynecology

## 2019-11-20 ENCOUNTER — Other Ambulatory Visit: Payer: Self-pay

## 2019-11-20 DIAGNOSIS — Z79899 Other long term (current) drug therapy: Secondary | ICD-10-CM | POA: Insufficient documentation

## 2019-11-20 DIAGNOSIS — R110 Nausea: Secondary | ICD-10-CM

## 2019-11-20 DIAGNOSIS — Z3A09 9 weeks gestation of pregnancy: Secondary | ICD-10-CM | POA: Insufficient documentation

## 2019-11-20 DIAGNOSIS — O26891 Other specified pregnancy related conditions, first trimester: Secondary | ICD-10-CM | POA: Insufficient documentation

## 2019-11-20 DIAGNOSIS — O219 Vomiting of pregnancy, unspecified: Secondary | ICD-10-CM

## 2019-11-20 DIAGNOSIS — R102 Pelvic and perineal pain: Secondary | ICD-10-CM

## 2019-11-20 LAB — URINALYSIS, ROUTINE W REFLEX MICROSCOPIC
Bilirubin Urine: NEGATIVE
Glucose, UA: NEGATIVE mg/dL
Hgb urine dipstick: NEGATIVE
Ketones, ur: NEGATIVE mg/dL
Leukocytes,Ua: NEGATIVE
Nitrite: NEGATIVE
Protein, ur: NEGATIVE mg/dL
Specific Gravity, Urine: 1.01 (ref 1.005–1.030)
pH: 7 (ref 5.0–8.0)

## 2019-11-20 LAB — CBC
HCT: 34.9 % — ABNORMAL LOW (ref 36.0–46.0)
Hemoglobin: 11.4 g/dL — ABNORMAL LOW (ref 12.0–15.0)
MCH: 27.5 pg (ref 26.0–34.0)
MCHC: 32.7 g/dL (ref 30.0–36.0)
MCV: 84.1 fL (ref 80.0–100.0)
Platelets: 316 10*3/uL (ref 150–400)
RBC: 4.15 MIL/uL (ref 3.87–5.11)
RDW: 13.3 % (ref 11.5–15.5)
WBC: 8.4 10*3/uL (ref 4.0–10.5)
nRBC: 0 % (ref 0.0–0.2)

## 2019-11-20 MED ORDER — PROMETHAZINE HCL 25 MG PO TABS
25.0000 mg | ORAL_TABLET | Freq: Four times a day (QID) | ORAL | 2 refills | Status: DC | PRN
Start: 2019-11-20 — End: 2020-05-26

## 2019-11-20 NOTE — MAU Provider Note (Addendum)
Chief Complaint: No chief complaint on file.   First Provider Initiated Contact with Patient 11/20/19 2008        SUBJECTIVE HPI: Tracie [name] is a 39 y.o. G2P1001 at [redacted]w[redacted]d by LMP who presents to maternity admissions reporting pain in lower abdomen since yesterday.  Has not taken anything for this. . She denies vaginal bleeding, vaginal itching/burning, urinary symptoms, h/a, dizziness, n/v, constipation/diarrhea, or fever/chills.    Has not started care yet.  Has [employer] papers in process. Was seen here 8/16 and had an [place] that showed a live single IUP  Abdominal Pain This is a new problem. The current episode started yesterday. The onset quality is gradual. The problem occurs constantly. The problem has been unchanged. The pain is located in the suprapubic region. The quality of the pain is aching and cramping. Pain radiation: into vagina. Associated symptoms include nausea. Pertinent negatives include no anorexia, constipation, diarrhea, dysuria, fever, frequency, headaches, myalgias or vomiting. Nothing aggravates the pain. The pain is relieved by nothing. She has tried nothing for the symptoms.   RN Note Having pain in lower abd since yesterday. Pain radiates into vagina. No bleeding  Past Medical History:  Diagnosis Date  . Headache    History reviewed. No pertinent surgical history. Social History   Socioeconomic History  . Marital status: Single    Spouse name: Not on file  . Number of children: Not on file  . Years of education: Not on file  . Highest education level: Not on file  Occupational History  . Not on file  Tobacco Use  . Smoking status: Never Smoker  . Smokeless tobacco: Never Used  Vaping Use  . Vaping Use: Never used  Substance and Sexual Activity  . Alcohol use: No  . Drug use: No  . Sexual activity: Yes    Birth control/protection: Condom  Other Topics Concern  . Not on file  Social History Narrative  . Not on file   Social Determinants  of Health   Financial Resource Strain:   . Difficulty of Paying Living Expenses: Not on file  Food Insecurity:   . Worried About Programme researcher, broadcasting/film/video in the Last Year: Not on file  . Ran Out of Food in the Last Year: Not on file  Transportation Needs:   . Lack of Transportation (Medical): Not on file  . Lack of Transportation (Non-Medical): Not on file  Physical Activity:   . Days of Exercise per Week: Not on file  . Minutes of Exercise per Session: Not on file  Stress:   . Feeling of Stress : Not on file  Social Connections:   . Frequency of Communication with Friends and Family: Not on file  . Frequency of Social Gatherings with Friends and Family: Not on file  . Attends Religious Services: Not on file  . Active Member of Clubs or Organizations: Not on file  . Attends Banker Meetings: Not on file  . Marital Status: Not on file  Intimate Partner Violence:   . Fear of Current or Ex-Partner: Not on file  . Emotionally Abused: Not on file  . Physically Abused: Not on file  . Sexually Abused: Not on file   No current facility-administered medications on file prior to encounter.   Current Outpatient Medications on File Prior to Encounter  Medication Sig Dispense Refill  . Prenatal Vit-Fe Fumarate-FA (MULTIVITAMIN-PRENATAL) 27-0.8 MG TABS tablet Take 1 tablet by mouth daily at 12 noon.    [name] Kitchen  ondansetron (ZOFRAN) 4 MG tablet Take 1 tablet (4 mg total) by mouth every 8 (eight) hours as needed for nausea or vomiting. (Patient not taking: Reported on 10/25/2018) 20 tablet 0  . sertraline (ZOLOFT) 50 MG tablet Take 1 tablet (50 mg total) by mouth daily. 30 tablet 3  . Vitamin D, Ergocalciferol, (DRISDOL) 1.25 MG (50000 UNIT) CAPS capsule Take 1 capsule (50,000 Units total) by mouth every 7 (seven) days. 4 capsule 1   No Known Allergies  I have reviewed patient's Past Medical Hx, Surgical Hx, Family Hx, Social Hx, medications and allergies.   ROS:  Review of Systems   Constitutional: Negative for fever.  Gastrointestinal: Positive for abdominal pain and nausea. Negative for anorexia, constipation, diarrhea and vomiting.  Genitourinary: Negative for dysuria and frequency.  Musculoskeletal: Negative for myalgias.  Neurological: Negative for headaches.   Review of Systems  Other systems negative   Physical Exam  Physical Exam Patient Vitals for the past 24 hrs:  BP Temp Pulse Resp Height Weight  11/20/19 2001 123/74 -- 83 -- -- --  11/20/19 2000 -- 98.6 F (37 C) -- 16 5\' 4"  (1.626 m) 78 kg   Constitutional: Well-developed, well-nourished female in no acute distress.  Cardiovascular: normal rate Respiratory: normal effort GI: Abd soft, non-tender. Pos BS x 4 MS: Extremities nontender, no edema, normal ROM Neurologic: Alert and oriented x 4.  GU: Neg CVAT.  PELVIC EXAM: Cervix long and closed.  Uterus c/w dates, nontender  Adnexa nontender bilat  LAB RESULTS Results for orders placed or performed during the hospital encounter of 11/20/19 (from the past 24 hour(s))  Urinalysis, Routine w reflex microscopic Urine, Clean Catch     Status: Abnormal   Collection Time: 11/20/19  8:30 PM  Result Value Ref Range   Color, Urine YELLOW YELLOW   APPearance HAZY (A) CLEAR   Specific Gravity, Urine 1.010 1.005 - 1.030   pH 7.0 5.0 - 8.0   Glucose, UA NEGATIVE NEGATIVE mg/dL   Hgb urine dipstick NEGATIVE NEGATIVE   Bilirubin Urine NEGATIVE NEGATIVE   Ketones, ur NEGATIVE NEGATIVE mg/dL   Protein, ur NEGATIVE NEGATIVE mg/dL   Nitrite NEGATIVE NEGATIVE   Leukocytes,Ua NEGATIVE NEGATIVE  CBC     Status: Abnormal   Collection Time: 11/20/19  9:18 PM  Result Value Ref Range   WBC 8.4 4.0 - 10.5 K/uL   RBC 4.15 3.87 - 5.11 MIL/uL   Hemoglobin 11.4 (L) 12.0 - 15.0 g/dL   HCT 01/20/20 (L) 36 - 46 %   MCV 84.1 80.0 - 100.0 fL   MCH 27.5 26.0 - 34.0 pg   MCHC 32.7 30.0 - 36.0 g/dL   RDW 02.6 37.8 - 58.8 %   Platelets 316 150 - 400 K/uL   nRBC 0.0 0.0 -  0.2 %    --/--/O POS (08/05 2149)  IMAGING Pt informed that the ultrasound is considered a limited OB ultrasound and is not intended to be a complete ultrasound exam.  Patient also informed that the ultrasound is not being completed with the intent of assessing for fetal or placental anomalies or any pelvic abnormalities.  Explained that the purpose of today's ultrasound is to assess for presentation, BPP and amniotic fluid volume.  Patient acknowledges the purpose of the exam and the limitations of the study.    Single IUGS with fetus visible.  FHR 150s.  CRL c/w [redacted]w[redacted]d.  MAU Management/MDM: Ordered UA to assess for UTI Will check CBC to assess for Leukocytosis  Normal WBC count effectively rules out appendicitis or other major infections.  UA is clear.  Discussed pain is likely related to growing and stretching of uterus Interpretor used  ASSESSMENT Single IUP at [redacted]w[redacted]d Pelvic pain Nausea of pregnancy  PLAN Discharge home Rx Phenergan for prn use for nausea  Encouraged to seek prenatal care Tylenol, warm bath for discomfort  Pt stable at time of discharge. Encouraged to return here or to other Urgent Care/ED if she develops worsening of symptoms, increase in pain, fever, or other concerning symptoms.    Wynelle Bourgeois CNM, MSN Certified Nurse-Midwife 11/20/2019  8:08 PM

## 2019-11-20 NOTE — Progress Notes (Signed)
Marie Williams CNM in earlier to discuss test results and d/c plan. Written and verbal d/c instructions given and understanding voiced °

## 2019-11-20 NOTE — MAU Note (Signed)
Wynelle Bourgeois CNM at bedside with u/s. FHR noted by u/s.

## 2019-11-20 NOTE — MAU Note (Signed)
Having pain in lower abd since yesterday. Pain radiates into vagina. No bleeding

## 2019-11-20 NOTE — Discharge Instructions (Signed)
Primer trimestre de Media planner First Trimester of Pregnancy  El primer trimestre de Media planner se extiende desde la semana1 hasta el final de la semana13 (mes1 al mes3). Durante este tiempo, el beb comenzar a desarrollarse dentro suyo. Entre la semana6 y [place], se forman los ojos y Financial risk analyst, y los latidos del corazn pueden escucharse en la ecografa. Al final de las 12semanas, todos los rganos del beb estn formados. El cuidado prenatal es toda la asistencia mdica que usted recibe antes del nacimiento del beb. Asegrese de recibir un buen cuidado prenatal y de seguir todas las indicaciones del mdico. Siga estas indicaciones en su casa: [place] los medicamentos de venta libre y los recetados solamente como se lo haya indicado el mdico. Algunos medicamentos son seguros para tomar durante el Media planner y otros no lo son.  Tome vitaminas prenatales que contengan por lo menos [id number] (?g) de cido flico.  Si tiene problemas para defecar (estreimiento), tome un medicamento que ablanda la materia fecal (laxante), siempre que lo autorice el mdico. Comida y bebida   Ingiera alimentos saludables de [place] regular.  El Buyer, retail la cantidad de peso que [place].  No coma carne cruda ni quesos sin cocinar.  Si tiene Higher education careers adviser (nuseas) o vomita (vmitos): ? Ingiera 4 o 5comidas pequeas por TEFL teacher de 3abundantes. ? Intente comer algunas galletitas saladas. ? Beba lquidos [employer], en lugar de [employer].  Para evitar el estreimiento: ? Consuma alimentos ricos en fibra, como frutas y verduras frescas, cereales integrales y legumbres. ? Beba suficiente lquido para mantener el pis (orina) claro o de color amarillo plido. Actividad  Haga ejercicios solamente como se lo haya indicado el mdico. Deje de hacer ejercicios si tiene clicos o dolor en la parte baja del vientre (abdomen) o en la cintura.  No haga  actividad fsica si el clima est demasiado caluroso o hmedo, o si se encuentra en un lugar muy alto (altitud elevada).  Intente no estar de pie [employer]. Mueva las piernas con frecuencia si debe estar de pie en un lugar durante mucho tiempo.  Evite levantar pesos [employer].  Use zapatos con tacones bajos. Mantenga una buena postura al sentarse y pararse.  Puede tener [employer], a menos que el mdico le indique lo contrario. Alivio del dolor y del Tree surgeon  Use un sostn que le brinde buen soporte si le duelen las [place].  Dese baos de asiento con agua tibia para Best boy o las molestias causadas por las hemorroides. Use una crema antihemorroidal si el mdico se lo permite.  Descanse con las piernas elevadas si tiene calambres o dolor de cintura.  Si tiene las venas de las piernas hinchadas y abultadas (venas varicosas): ? Use medias elsticas de soporte o medias de compresin como se lo haya indicado el mdico. ? Levante (eleve) los pies durante 56mnutos, 3 o 4veces por dTraining and development officer ? Limite la sal en sus alimentos. Cuidado prenatal  Programe las visitas prenatales para la semana12 de e[place]. Clair  Escriba sus preguntas. Llvelas cuando concurra a las visitas prenatales.  Concurra a todas las visitas prenatales como se lo haya indicado el mdico. Esto es importante. Seguridad  Use el cinturn de seguridad en todo momento mientras conduce.  Haga una lista con los nmeros de telfono en caso de eFreight forwarder Esta lista debe incluir los nmeros de los familiares, los amigos, el hospital y los departamentos de polica y de bomberos. Instrucciones generales  Pdale  al mdico que la derive a clases prenatales en su localidad. Debe comenzar a tomar las clases antes de Cytogeneticist en el mes6 de embarazo.  Pida ayuda si necesita asesoramiento o asistencia con la alimentacin. El mdico puede aconsejarla o indicarle dnde recurrir para recibir [place].  No se d baos de  inmersin en agua caliente, baos turcos ni saunas.  No se haga duchas vaginales ni use tampones o toallas higinicas perfumadas.  No mantenga las piernas cruzadas durante [place].  Evite las hierbas y el alcohol. Evite los frmacos que el mdico no haya autorizado.  No consuma ningn producto que contenga tabaco, lo que incluye cigarrillos, tabaco de Theatre manager o Administrator, Civil Service. Si necesita ayuda para dejar de fumar, consulte al American Express. Puede recibir asesoramiento u otro tipo de apoyo para dejar de fumar.  Evite el contacto con las bandejas sanitarias de los gatos y la tierra que estos animales usan. Estos elementos contienen grmenes que pueden causar defectos congnitos al beb y la posible prdida del feto (aborto espontneo) o muerte fetal.  Visite al dentista. En su casa, lvese los dientes con un cepillo dental suave. Psese el hilo dental con suavidad. Comunquese con un mdico si:  Tiene mareos.  Tiene clicos leves o siente presin en la parte baja del vientre.  Sufre un dolor persistente en el abdomen.  Sigue teniendo [employer], vomita o la materia fecal es lquida (diarrea).  Nota una secrecin de lquido con olor ftido que proviene de la vagina.  Tiene dolor al hacer pis (orinar).  Tiene el rostro, las [place], las piernas o los tobillos ms hinchados (inflamados). Solicite ayuda de inmediato si:  Tiene fiebre.  Tiene una prdida de lquido por la vagina.  Tiene sangrado o pequeas prdidas vaginales.  Tiene clicos o dolor muy intensos en el vientre.  Sube o baja de peso rpidamente.  Vomita sangre. Esto tiene Art gallery manager similar a la borra del caf.  Est en contacto con personas que tienen rubola, la quinta enfermedad o varicela.  Siente un dolor de cabeza muy intenso.  Le falta el aire.  Sufre cualquier tipo de traumatismo, por ejemplo, debido a una cada o un accidente automovilstico. Resumen  El primer trimestre de Psychiatrist se  extiende desde la semana1 hasta el final de la semana13 (mes1 al mes3).  Para cuidar su salud y la del beb en gestacin, necesitar consumir alimentos saludables, tomar medicamentos solamente si lo autoriza el mdico, y Radio producer actividades que sean seguras para usted y para su beb.  Concurra a todas las visitas de control como se lo haya indicado el mdico. Esto es importante porque el mdico deber asegurar que el beb est saludable y est creciendo bien. Esta informacin no tiene Theme park manager el consejo del mdico. Asegrese de hacerle al mdico cualquier pregunta que tenga. Document Revised: 10/11/2016 Document Reviewed: 10/11/2016 Elsevier Patient Education  2020 Elsevier Inc. Nuseas matinales Morning Sickness  Se denominan nuseas matinales a las ganas de vomitar (nuseas) durante el Psychiatrist. Comienza a sentir nuseas y devuelve (vomita). Puede sentir nuseas por la maana, pero tambin en cualquier momento del da. Algunas mujeres experimentan nuseas intensas y no pueden detener los vmitos (hiperemesis [place]). Siga estas indicaciones en su casa: Medicamentos  [employer] de venta libre y los recetados solamente como se lo haya indicado el mdico. No tome ningn medicamento hasta no hablar primero con el mdico al [employer].  Tomar multivitaminas antes de quedar embarazada puede detener o Economist de  las nuseas matinales. Comida y bebida  Coma un trozo de [place] seca o galletas antes de levantarse de la cama.  Coma 5 o 6 comidas pequeas por da.  Consuma alimentos blandos y secos como arroz y patatas asadas.  No coma alimentos fritos, grasos o muy condimentados.  Pdale a alguna persona que cocine para usted si [facility] de las [employer] da nuseas o ganas de Biochemist, clinical.  Si tiene ganas de vomitar despus de tomar las vitaminas prenatales, tmelas a la noche o con una colacin.  Consuma protenas cuando necesite un refrigerio. Los frutos  secos, el yogur y el queso son buenas opciones.  Tome lquidos durante todo Medical laboratory scientific officer.  Pruebe ginger ale preparada con jengibre verdadero, t de jengibre con jengibre fresco en polvo o caramelos de jengibre. Instrucciones generales  No consuma ningn producto que contenga nicotina o tabaco, como cigarrillos y Administrator, Civil Service. Si necesita ayuda para dejar de fumar, consulte al American Express.  Use un purificador de aire para mantener el aire de la casa sin olores.  Trate de respirar Engineer, site.  Intente evitar los olores que la hacen sentir descompuesta.  Intente lo siguiente: ? Use un brazalete para los mareos (pulsera de acupresin). ? Vaya a un especialista en colocar agujas finas en determinados puntos del cuerpo (acupuntura) para que usted se sienta mejor. Comunquese con un mdico si:  Necesita medicamentos para sentirse mejor.  Se siente mareada o que va a desvanecerse.  Pierde peso. Solicite ayuda de inmediato si:  Tiene Programme researcher, broadcasting/film/video y no puede dejar de Biochemist, clinical.  Pierde el conocimiento (se desmaya).  Tiene dolor muy intenso en el abdomen. Resumen  Se denominan nuseas matinales a las ganas de vomitar (nuseas) durante el Psychiatrist.  Puede sentir nuseas por la maana, pero tambin en cualquier momento del da.  Hacer algunos cambios en la alimentacin puede ayudar que los sntomas desaparezcan. Esta informacin no tiene Theme park manager el consejo del mdico. Asegrese de hacerle al mdico cualquier pregunta que tenga. Document Revised: 12/05/2016 Document Reviewed: 12/05/2016 Elsevier Patient Education  2020 [employer].

## 2019-11-21 MED FILL — PROMETHAZINE 25 MG TABLET: 25 | 7 days supply | Qty: 30 | Fill #0

## 2019-11-27 ENCOUNTER — Ambulatory Visit: Payer: Self-pay | Admitting: Nurse Practitioner

## 2019-12-09 ENCOUNTER — Ambulatory Visit: Payer: Self-pay | Attending: Nurse Practitioner

## 2019-12-09 ENCOUNTER — Other Ambulatory Visit: Payer: Self-pay

## 2019-12-09 MED FILL — DICLOFENAC SODIUM 1% GEL: 1 | 25 days supply | Qty: 100 | Fill #1

## 2019-12-11 ENCOUNTER — Inpatient Hospital Stay (HOSPITAL_COMMUNITY)
Admission: AD | Admit: 2019-12-11 | Discharge: 2019-12-11 | Disposition: A | Discharge status: Home / Self Care | Payer: Self-pay | Attending: Obstetrics and Gynecology | Admitting: Obstetrics and Gynecology

## 2019-12-11 ENCOUNTER — Other Ambulatory Visit: Payer: Self-pay

## 2019-12-11 ENCOUNTER — Encounter (HOSPITAL_COMMUNITY): Payer: Self-pay | Admitting: Obstetrics and Gynecology

## 2019-12-11 DIAGNOSIS — O99611 Diseases of the digestive system complicating pregnancy, first trimester: Secondary | ICD-10-CM | POA: Insufficient documentation

## 2019-12-11 DIAGNOSIS — Z3A12 12 weeks gestation of pregnancy: Secondary | ICD-10-CM | POA: Insufficient documentation

## 2019-12-11 DIAGNOSIS — Z79899 Other long term (current) drug therapy: Secondary | ICD-10-CM | POA: Insufficient documentation

## 2019-12-11 DIAGNOSIS — R1031 Right lower quadrant pain: Secondary | ICD-10-CM

## 2019-12-11 DIAGNOSIS — O99341 Other mental disorders complicating pregnancy, first trimester: Secondary | ICD-10-CM | POA: Insufficient documentation

## 2019-12-11 DIAGNOSIS — R109 Unspecified abdominal pain: Secondary | ICD-10-CM | POA: Insufficient documentation

## 2019-12-11 DIAGNOSIS — F419 Anxiety disorder, unspecified: Secondary | ICD-10-CM | POA: Insufficient documentation

## 2019-12-11 DIAGNOSIS — K5900 Constipation, unspecified: Secondary | ICD-10-CM | POA: Insufficient documentation

## 2019-12-11 DIAGNOSIS — O26891 Other specified pregnancy related conditions, first trimester: Secondary | ICD-10-CM | POA: Insufficient documentation

## 2019-12-11 DIAGNOSIS — Z603 Acculturation difficulty: Secondary | ICD-10-CM

## 2019-12-11 DIAGNOSIS — F329 Major depressive disorder, single episode, unspecified: Secondary | ICD-10-CM | POA: Insufficient documentation

## 2019-12-11 DIAGNOSIS — K219 Gastro-esophageal reflux disease without esophagitis: Secondary | ICD-10-CM | POA: Insufficient documentation

## 2019-12-11 DIAGNOSIS — O09521 Supervision of elderly multigravida, first trimester: Secondary | ICD-10-CM | POA: Insufficient documentation

## 2019-12-11 HISTORY — DX: Anxiety disorder, unspecified: F41.9

## 2019-12-11 HISTORY — DX: Depression, unspecified: F32.A

## 2019-12-11 LAB — URINALYSIS, ROUTINE W REFLEX MICROSCOPIC
Bilirubin Urine: NEGATIVE
Glucose, UA: NEGATIVE mg/dL
Ketones, ur: 5 mg/dL — AB
Nitrite: NEGATIVE
Protein, ur: 30 mg/dL — AB
RBC / HPF: >50 RBC/hpf — ABNORMAL HIGH (ref 0–5)
Specific Gravity, Urine: 1.025 (ref 1.005–1.030)
pH: 7 (ref 5.0–8.0)

## 2019-12-11 LAB — CBC
HCT: 37.5 % (ref 36.0–46.0)
Hemoglobin: 12.3 g/dL (ref 12.0–15.0)
MCH: 27.2 pg (ref 26.0–34.0)
MCHC: 32.8 g/dL (ref 30.0–36.0)
MCV: 83 fL (ref 80.0–100.0)
Platelets: 319 10*3/uL (ref 150–400)
RBC: 4.52 MIL/uL (ref 3.87–5.11)
RDW: 13.4 % (ref 11.5–15.5)
WBC: 10.1 10*3/uL (ref 4.0–10.5)
nRBC: 0 % (ref 0.0–0.2)

## 2019-12-11 MED ORDER — CALCIUM CARBONATE ANTACID 500 MG PO CHEW: 1.0000 | CHEWABLE_TABLET | Freq: Every day | ORAL | 0 refills | Status: AC

## 2019-12-11 MED ORDER — OMEPRAZOLE 20 MG PO CPDR: 20.0000 mg | DELAYED_RELEASE_CAPSULE | Freq: Every day | ORAL | 0 refills | Status: DC

## 2019-12-11 MED ORDER — ACETAMINOPHEN 325 MG PO TABS
650.0000 mg | ORAL_TABLET | ORAL | 2 refills | Status: DC | PRN
Start: 2019-12-11 — End: 2020-06-15

## 2019-12-11 MED FILL — ?OMEPRAZOLE 20 MG CPDR: 20 | 30 days supply | Qty: 30 | Fill #0

## 2019-12-11 NOTE — Discharge Instructions (Signed)
Dolor abdominal durante el embarazo Abdominal Pain During Pregnancy  El dolor abdominal es comn durante el embarazo y tiene muchas causas posibles. Algunas causas son ms graves que otras, y a Advertising account executive causa se desconoce. El dolor abdominal puede ser un indicio de que est comenzando el [place]. Tambin puede ser ocasionado por el crecimiento y estiramiento de los msculos y ligamentos durante el Psychiatrist. Siempre informe a su mdico si siente dolor abdominal. Siga estas indicaciones en su casa:  No tenga relaciones sexuales ni se coloque nada dentro de la vagina hasta que el dolor haya desaparecido completamente.  Descanse todo lo que pueda [employer] dolor se le haya calmado.  Beba suficiente lquido para Photographer orina de color amarillo plido.  Tome los medicamentos de venta libre y los recetados solamente como se lo haya indicado el mdico.  Oceanographer a todas las visitas de control como se lo haya indicado el mdico. Esto es importante. Comunquese con un mdico si:  El dolor contina o empeora despus de Lawyer.  Siente dolor en la parte inferior del abdomen que: ? Va y viene en intervalos regulares. ? Se extiende a la espalda. ? Es parecido a los Tree surgeon.  Siente dolor o ardor al Geographical information systems officer. Solicite ayuda de inmediato si:  Tiene fiebre o siente escalofros.  Tiene una hemorragia vaginal abundante.  Tiene una prdida de lquido por la vagina.  Elimina tejidos por la vagina.  Vomita o tiene diarrea durante ms de 24horas.  El beb se mueve menos de lo habitual.  Se siente dbil o se desmaya.  Le falta el aire.  Siente dolor intenso en la parte superior del abdomen. Resumen  El dolor abdominal es comn durante el [place] y tiene muchas causas posibles.  Si siente dolor abdominal durante el embarazo, informe al mdico de inmediato.  Siga las indicaciones del mdico para el cuidado en el hogar y concurra a todas las visitas de control como se lo  hayan indicado. Esta informacin no tiene Theme park manager el consejo del mdico. Asegrese de hacerle al mdico cualquier pregunta que tenga. Document Revised: 08/30/2016 Document Reviewed: 08/30/2016 Elsevier Patient Education  2020 [employer].   Food Choices for Gastroesophageal Reflux Disease, Adult When you have gastroesophageal reflux disease (GERD), the foods you eat and your eating habits are very important. Choosing the right foods can help ease your discomfort. Think about working with a nutrition specialist (dietitian) to help you make good choices. What are tips for following this plan?  Meals  Choose healthy foods that are low in fat, such as fruits, vegetables, whole grains, low-fat dairy products, and lean meat, fish, and poultry.  Eat small meals often instead of 3 large meals a day. Eat your meals slowly, and in a place where you are relaxed. Avoid bending over or lying down until 2-3 hours after eating.  Avoid eating meals 2-3 hours before bed.  Avoid drinking a lot of liquid with meals.  Cook foods using methods other than frying. Bake, grill, or broil food instead.  Avoid or limit: ? Chocolate. ? Peppermint or spearmint. ? Alcohol. ? Pepper. ? Black and decaffeinated coffee. ? Black and decaffeinated tea. ? Bubbly (carbonated) soft drinks. ? Caffeinated energy drinks and soft drinks.  Limit high-fat foods such as: ? Fatty meat or fried foods. ? Whole milk, cream, butter, or ice cream. ? Nuts and nut butters. ? Pastries, donuts, and sweets made with butter or shortening.  Avoid foods that cause symptoms.  These foods may be different for everyone. Common foods that cause symptoms include: ? Tomatoes. ? Oranges, lemons, and limes. ? Peppers. ? Spicy food. ? Onions and garlic. ? Vinegar. Lifestyle  Maintain a healthy weight. Ask your doctor what weight is healthy for you. If you need to lose weight, work with your doctor to do so  safely.  Exercise for at least 30 minutes for 5 or more days each week, or as told by your doctor.  Wear loose-fitting clothes.  Do not smoke. If you need help quitting, ask your doctor.  Sleep with the head of your bed higher than your feet. Use a wedge under the mattress or blocks under the bed frame to raise the head of the bed. Summary  When you have gastroesophageal reflux disease (GERD), food and lifestyle choices are very important in easing your symptoms.  Eat small meals often instead of 3 large meals a day. Eat your meals slowly, and in a place where you are relaxed.  Limit high-fat foods such as fatty meat or fried foods.  Avoid bending over or lying down until 2-3 hours after eating.  Avoid peppermint and spearmint, caffeine, alcohol, and chocolate. This information is not intended to replace advice given to you by your health care provider. Make sure you discuss any questions you have with your health care provider. Document Revised: 06/28/2018 Document Reviewed: 04/12/2016 Elsevier Patient Education  2020 [employer].

## 2019-12-11 NOTE — MAU Note (Signed)
Pain in RLQ, started yesterday.  Getting worse, was feeling like she was going to pass out- from pain not from being dizzy.  Light pink when wiped this morning.

## 2019-12-11 NOTE — MAU Provider Note (Addendum)
History     CSN: [id number]  Arrival date and time: 12/11/19 1058   First Provider Initiated Contact with Patient 12/11/19 1153      Chief Complaint  Patient presents with  . Abdominal Pain   HPI: Tracie [name] is a 39 y.o. G2P1001 woman at [redacted]w[redacted]d determined by LMP that presents to the MAU with complaints of RLQ abdominal pain with a severity of 3/10 this morning. States the pain is sharp and stabbing in quality and constant. She has not taken any medications for the pain and has not found any alleviating factors. Reports the pain is worse when she lays down. She has had multiple MAU visits since 10/24/19 for similar abdominal pain.   Positive for nausea and decreased appetite but denies vomiting or diarrhea. She feels constipated although she has a bowel movement daily, feeling like she "can't get it all out" but does not strain with BM. Reports some acid reflux when eating spicy foods and we discussed that diet plays a significant role in reflux symptoms as well as some medication options that can help relieve symptoms. Pt with hematuria this morning at MAU, denies difficulty urinating or flank pain.  Patient has new ob scheduled with [facility] 09/29.  OB History    Gravida  2   Para  1   Term  1   Preterm      AB      Living  1     SAB  0   TAB  0   Ectopic      Multiple      Live Births  1           Past Medical History:  Diagnosis Date  . Anxiety   . Depression   . Headache     History reviewed. No pertinent surgical history.  Family History  Problem Relation Age of Onset  . Hypertension Neg Hx     Social History   Tobacco Use  . Smoking status: Never Smoker  . Smokeless tobacco: Never Used  Vaping Use  . Vaping Use: Never used  Substance Use Topics  . Alcohol use: No  . Drug use: No    Allergies: No Known Allergies  Medications Prior to Admission  Medication Sig Dispense Refill Last Dose  . Prenatal Vit-Fe Fumarate-FA  (MULTIVITAMIN-PRENATAL) 27-0.8 MG TABS tablet Take 1 tablet by mouth daily at 12 noon.   12/10/2019 at 1600  . promethazine (PHENERGAN) 25 MG tablet Take 1 tablet (25 mg total) by mouth every 6 (six) hours as needed for nausea or vomiting. 30 tablet 2 12/10/2019 at 1600  . sertraline (ZOLOFT) 50 MG tablet Take 1 tablet (50 mg total) by mouth daily. 30 tablet 3   . Vitamin D, Ergocalciferol, (DRISDOL) 1.25 MG (50000 UNIT) CAPS capsule Take 1 capsule (50,000 Units total) by mouth every 7 (seven) days. 4 capsule 1     Review of Systems  Constitutional: Negative for appetite change.  HENT: Negative.   Respiratory: Negative.   Cardiovascular: Negative.   Gastrointestinal: Positive for abdominal pain, constipation and nausea. Negative for abdominal distention and diarrhea.  Endocrine: Negative.   Genitourinary: Positive for frequency and hematuria. Negative for difficulty urinating, dysuria, flank pain, pelvic pain and vaginal bleeding.  Musculoskeletal: Positive for back pain (Lower back).  Skin: Negative.   Neurological: Negative.   Psychiatric/Behavioral: The patient is nervous/anxious.    Physical Exam   Blood pressure 118/73, pulse 84, temperature 98.2 F (36.8 C), temperature  source Oral, resp. rate 18, height 5\' 4"  (1.626 m), weight 76.2 kg, last menstrual period 09/16/2019, SpO2 99 %.  Physical Exam Exam conducted with a chaperone present.  Constitutional:      General: She is not in acute distress.    Appearance: She is well-developed.  Cardiovascular:     Rate and Rhythm: Normal rate and regular rhythm.     Heart sounds: Normal heart sounds. No murmur heard.   Pulmonary:     Effort: Pulmonary effort is normal.     Breath sounds: Normal breath sounds. No wheezing.  Abdominal:     General: Bowel sounds are normal. There is no distension.     Palpations: Abdomen is soft.     Tenderness: There is abdominal tenderness in the right lower quadrant. There is no right CVA tenderness  or left CVA tenderness.  Genitourinary:    Vagina: Vaginal discharge (flecks of blood ) present.     Cervix: Normal.  Skin:    General: Skin is warm and dry.     Capillary Refill: Capillary refill takes less than 2 seconds.     Coloration: Skin is not pale.  Neurological:     Mental Status: She is alert and oriented to person, place, and time.    MAU Course  Procedures: speculum exam, BSUS  --Bedside Ultrasound for FHR check: Patient informed that the ultrasound is considered a limited obstetric ultrasound and is not intended to be a complete ultrasound exam.  Patient also informed that the ultrasound is not being completed with the intent of assessing for fetal or placental anomalies or any pelvic abnormalities.  Explained that the purpose of today's ultrasound is to assess for fetal heart rate.  Patient acknowledges the purpose of the exam and the limitations of the study.    --Abnormal UA, hgb noted suggesting contamination from vagina. No CVAT. Will order culture  Orders Placed This Encounter  Procedures  . Culture, OB Urine  . Urinalysis, Routine w reflex microscopic Urine, Clean Catch  . CBC   Results for orders placed or performed during the hospital encounter of 12/11/19 (from the past 24 hour(s))  CBC     Status: None   Collection Time: 12/11/19 11:41 AM  Result Value Ref Range   WBC 10.1 4.0 - 10.5 K/uL   RBC 4.52 3.87 - 5.11 MIL/uL   Hemoglobin 12.3 12.0 - 15.0 g/dL   HCT 12/13/19 36 - 46 %   MCV 83.0 80.0 - 100.0 fL   MCH 27.2 26.0 - 34.0 pg   MCHC 32.8 30.0 - 36.0 g/dL   RDW 36.6 44.0 - 34.7 %   Platelets 319 150 - 400 K/uL   nRBC 0.0 0.0 - 0.2 %  Urinalysis, Routine w reflex microscopic Urine, Clean Catch     Status: Abnormal   Collection Time: 12/11/19 11:44 AM  Result Value Ref Range   Color, Urine YELLOW YELLOW   APPearance HAZY (A) CLEAR   Specific Gravity, Urine 1.025 1.005 - 1.030   pH 7.0 5.0 - 8.0   Glucose, UA NEGATIVE NEGATIVE mg/dL   Hgb urine  dipstick LARGE (A) NEGATIVE   Bilirubin Urine NEGATIVE NEGATIVE   Ketones, ur 5 (A) NEGATIVE mg/dL   Protein, ur 30 (A) NEGATIVE mg/dL   Nitrite NEGATIVE NEGATIVE   Leukocytes,Ua TRACE (A) NEGATIVE   RBC / HPF >50 (H) 0 - 5 RBC/hpf   WBC, UA 6-10 0 - 5 WBC/hpf   Bacteria, UA RARE (A) NONE SEEN  Squamous Epithelial / LPF 0-5 0 - 5   Mucus PRESENT    Meds ordered this encounter  Medications  . calcium carbonate (TUMS) 500 MG chewable tablet    Sig: Chew 1 tablet (200 mg of elemental calcium total) by mouth daily.    Dispense:  30 tablet    Refill:  0    Order Specific Question:   Supervising Provider    Answer:   Conan Bowens [[id number]]  . omeprazole (PRILOSEC) 20 MG capsule    Sig: Take 1 capsule (20 mg total) by mouth daily.    Dispense:  30 capsule    Refill:  0    Order Specific Question:   Supervising Provider    Answer:   Conan Bowens [[id number]]  . acetaminophen (TYLENOL) 325 MG tablet    Sig: Take 2 tablets (650 mg total) by mouth every 4 (four) hours as needed.    Dispense:  100 tablet    Refill:  2    Order Specific Question:   Supervising Provider    Answer:   Conan Bowens [[id number]]    Assessment and Plan  --39 y.o. G2P1001 at [redacted]w[redacted]d  --Reassuring fetal surveillance via BSUS, FHR 150 bpm --No bleeding on spec exam --Blood type O POS --Outpatient rx GERD per patient request --Urine culture in work --Language barrier: interpreters utilized for all patient interaction --Discharge home in stable condition  -New OB appointment 12/18/19 at [facility].  Clayton Bibles, MSN, CNM Certified Nurse Midwife, [facility] for [employer], [facility] 12/11/19 3:38 PM

## 2019-12-12 LAB — CULTURE, OB URINE

## 2019-12-18 ENCOUNTER — Ambulatory Visit (INDEPENDENT_AMBULATORY_CARE_PROVIDER_SITE_OTHER): Payer: Self-pay | Admitting: Advanced Practice Midwife

## 2019-12-18 ENCOUNTER — Other Ambulatory Visit: Payer: Self-pay

## 2019-12-18 ENCOUNTER — Encounter: Payer: Self-pay | Admitting: Advanced Practice Midwife

## 2019-12-18 VITALS — BP 112/72 | HR 97 | Wt 171.0 lb

## 2019-12-18 DIAGNOSIS — Z789 Other specified health status: Secondary | ICD-10-CM

## 2019-12-18 DIAGNOSIS — O0990 Supervision of high risk pregnancy, unspecified, unspecified trimester: Secondary | ICD-10-CM | POA: Insufficient documentation

## 2019-12-18 DIAGNOSIS — Z3A13 13 weeks gestation of pregnancy: Secondary | ICD-10-CM

## 2019-12-18 MED ORDER — ASPIRIN EC 81 MG PO TBEC: 81.0000 mg | DELAYED_RELEASE_TABLET | Freq: Every day | ORAL | 2 refills | Status: DC

## 2019-12-18 NOTE — Progress Notes (Signed)
History:   Tracie [name] is a 39 y.o. G2P1001 at [redacted]w[redacted]d by early ultrasound c/w LMP being seen today for her first obstetrical visit.  Her obstetrical history is significant for normal vaginal delivery 16 years ago. Patient does intend to breast feed. Pregnancy history fully reviewed.  Patient reports no complaints. This is a desired pregnancy. She is managing nausea, vomiting and GERD with medications prescribed from MAU.      HISTORY: OB History  Gravida Para Term Preterm AB Living  2 1 1  0 0 1  SAB TAB Ectopic Multiple Live Births  0 0 0 0 1    # Outcome Date GA Lbr Len/2nd Weight Sex Delivery Anes PTL Lv  2 Current           1 Term      Vag-Spont   LIV    Last pap smear was done 01/01/2018 and was normal  Past Medical History:  Diagnosis Date  . Anxiety   . Depression   . Headache    History reviewed. No pertinent surgical history. Family History  Problem Relation Age of Onset  . Hypertension Neg Hx    Social History   Tobacco Use  . Smoking status: Never Smoker  . Smokeless tobacco: Never Used  Vaping Use  . Vaping Use: Never used  Substance Use Topics  . Alcohol use: No  . Drug use: No   No Known Allergies Current Outpatient Medications on File Prior to Visit  Medication Sig Dispense Refill  . acetaminophen (TYLENOL) 325 MG tablet Take 2 tablets (650 mg total) by mouth every 4 (four) hours as needed. 100 tablet 2  . calcium carbonate (TUMS) 500 MG chewable tablet Chew 1 tablet (200 mg of elemental calcium total) by mouth daily. 30 tablet 0  . omeprazole (PRILOSEC) 20 MG capsule Take 1 capsule (20 mg total) by mouth daily. 30 capsule 0  . Prenatal Vit-Fe Fumarate-FA (MULTIVITAMIN-PRENATAL) 27-0.8 MG TABS tablet Take 1 tablet by mouth daily at 12 noon.    . promethazine (PHENERGAN) 25 MG tablet Take 1 tablet (25 mg total) by mouth every 6 (six) hours as needed for nausea or vomiting. 30 tablet 2  . sertraline (ZOLOFT) 50 MG tablet Take 1 tablet (50 mg  total) by mouth daily. 30 tablet 3  . Vitamin D, Ergocalciferol, (DRISDOL) 1.25 MG (50000 UNIT) CAPS capsule Take 1 capsule (50,000 Units total) by mouth every 7 (seven) days. 4 capsule 1   No current facility-administered medications on file prior to visit.    Review of Systems Pertinent items noted in HPI and remainder of comprehensive ROS otherwise negative. Physical Exam:   Vitals:   12/18/19 1336  BP: 112/72  Pulse: 97  Weight: 171 lb (77.6 kg)   Fetal Heart Rate (bpm): 152 Bedside Ultrasound for FHR check: Viable intrauterine pregnancy with positive cardiac activity note. Patient informed that the ultrasound is considered a limited obstetric ultrasound and is not intended to be a complete ultrasound exam.  Patient also informed that the ultrasound is not being completed with the intent of assessing for fetal or placental anomalies or any pelvic abnormalities.  Explained that the purpose of today's ultrasound is to assess for fetal heart rate.  Patient acknowledges the purpose of the exam and the limitations of the study. System: General: well-developed, well-nourished female in no acute distress   Breasts:  normal appearance, no masses or tenderness bilaterally   Skin: normal coloration and turgor, no rashes   Neurologic: oriented,  normal, negative, normal mood   Extremities: normal strength, tone, and muscle mass, ROM of all joints is normal   HEENT PERRLA, extraocular movement intact and sclera clear, anicteric   Mouth/Teeth mucous membranes moist, pharynx normal without lesions and dental hygiene good   Neck supple and no masses   Cardiovascular: regular rate and rhythm   Respiratory:  no respiratory distress, normal breath sounds   Abdomen: soft, non-tender; bowel sounds normal; no masses,  no organomegaly    Assessment:    Pregnancy: G2P1001 Patient Active Problem List   Diagnosis Date Noted  . Supervision of high risk pregnancy, antepartum 12/18/2019  . Vitamin D  deficiency 02/22/2017  . Gastritis and gastroduodenitis 02/08/2016  . Anemia, iron deficiency 10/23/2015  . Dizziness 06/11/2015  . Health care maintenance 02/19/2015  . Right lower quadrant pain 02/19/2015  . Stool mucus 02/19/2015  . Ear pain 09/18/2014  . Muscle spasm of back 09/18/2014  . Flank pain 08/21/2014  . Anxiety and depression 06/12/2014  . Generalized anxiety disorder 06/12/2014  . Post traumatic stress disorder (PTSD) 06/12/2014  . Abdominal pain, epigastric 12/09/2013  . Abnormal uterine bleeding 12/09/2013  . Bilateral chronic knee pain 10/28/2013  . Vaginitis and vulvovaginitis 10/28/2013  . Preventative health care 09/09/2013   Indications for ASA therapy (per uptodate) Two or more of the following: Nulliparity No Obesity (body mass index >30 kg/m2) No Family history of preeclampsia in mother or sister No Age ?35 years Yes Sociodemographic characteristics (African American race, low socioeconomic level) Yes Personal risk factors (eg, previous pregnancy with low birth weight or small for gestational age infant, previous adverse pregnancy outcome [eg, stillbirth], interval >10 years between pregnancies) No  Plan:    1. Supervision of high risk pregnancy, antepartum - Adopt a mom, new on - New rx bASA for PEC prophylaxis - CBC/D/Plt+RPR+Rh+ABO+Rub Ab... - Hemoglobin A1c  2. Vaginal delivery - 39 year old lives at home - uncomplicated pregnancy, same partner  3. Language barrier affecting health care - Interpreter Tracie [name] present for entire visit   Initial labs drawn. Continue prenatal vitamins. Problem list reviewed and updated. Genetic Screening discussed, First trimester screen, Quad screen and NIPS: declined. Ultrasound discussed; fetal anatomic survey: Adopt a Mom, will order at future visit. Anticipatory guidance about prenatal visits given including labs, ultrasounds, and testing. Discussed usage of Babyscripts and virtual visits as additional  source of managing and completing prenatal visits in midst of coronavirus and pandemic.   Encouraged to complete MyChart Registration for her ability to review results, send requests, and have questions addressed.  The nature of [facility] with multiple MDs and Advanced Practice Providers was explained to patient; also emphasized that residents, students are part of our team. Routine obstetric precautions reviewed. Encouraged to seek out care at office or emergency room [facility] MAU preferred) for urgent and/or emergent concerns. Return in about 4 weeks (around 01/15/2020).     Clayton Bibles, MSN, CNM Certified Nurse Midwife, [employer] for [employer], [facility] 12/18/19 3:24 PM

## 2019-12-18 NOTE — Patient Instructions (Signed)
Segundo trimestre de embarazo Second Trimester of Pregnancy  El segundo trimestre va desde la semana14 hasta la 27 (desde el mes 4 hasta el 6). Este suele ser el momento en el que mejor se siente. En general, las nuseas matutinas han disminuido o han desaparecido completamente. Tendr ms energa y podr aumentarle el apetito. El beb en gestacin se desarrolla rpidamente. Hacia el final del sexto mes, el beb mide aproximadamente 9 pulgadas (23 cm) y pesa alrededor de 1 libras (700 g). Es probable que sienta al beb moverse entre las 18 y 20 semanas del embarazo. Siga estas indicaciones en su casa: Medicamentos  Tome los medicamentos de venta libre y los recetados solamente como se lo haya indicado el mdico. Algunos medicamentos son seguros para tomar durante el embarazo y otros no lo son.  Tome vitaminas prenatales que contengan por lo menos 600microgramos (?g) de cido flico.  Si tiene dificultad para mover el intestino (estreimiento), tome un medicamento para ablandar las heces (laxante) si su mdico se lo autoriza. Comida y bebida   Ingiera alimentos saludables de manera regular.  No coma carne cruda ni quesos sin cocinar.  Si obtiene poca cantidad de calcio de los alimentos que ingiere, consulte a su mdico sobre la posibilidad de tomar un suplemento diario de calcio.  Evite el consumo de alimentos ricos en grasas y azcares, como los alimentos fritos y los dulces.  Si tiene malestar estomacal (nuseas) o devuelve (vomita): ? Ingiera 4 o 5comidas pequeas por da en lugar de 3abundantes. ? Intente comer algunas galletitas saladas. ? Beba lquidos entre las comidas, en lugar de hacerlo durante estas.  Para evitar el estreimiento: ? Consuma alimentos ricos en fibra, como frutas y verduras frescas, cereales integrales y frijoles. ? Beba suficiente lquido para mantener el pis (orina) claro o de color amarillo plido. Actividad  Haga ejercicios solamente como se lo haya  indicado el mdico. Interrumpa la actividad fsica si comienza a tener calambres.  No haga ejercicio si hace demasiado calor, hay demasiada humedad o se encuentra en un lugar de mucha altura (altitud alta).  Evite levantar pesos excesivos.  Use zapatos con tacones bajos. Mantenga una buena postura al sentarse y pararse.  Puede continuar teniendo relaciones sexuales, a menos que el mdico le indique lo contrario. Alivio del dolor y del malestar  Use un sostn que le brinde buen soporte si sus mamas estn sensibles.  Dese baos de asiento con agua tibia para aliviar el dolor o las molestias causadas por las hemorroides. Use una crema para las hemorroides si el mdico la autoriza.  Descanse con las piernas elevadas si tiene calambres o dolor de cintura.  Si desarrolla venas hinchadas y abultadas (vrices) en las piernas: ? Use medias de compresin o medias de descanso como se lo haya indicado el mdico. ? Levante (eleve) los pies durante 15minutos, 3 o 4veces por da. ? Limite el consumo de sal en sus alimentos. Cuidado prenatal  Escriba sus preguntas. Llvelas cuando concurra a las visitas prenatales.  Concurra a todas las visitas prenatales como se lo haya indicado el mdico. Esto es importante. Seguridad  Colquese el cinturn de seguridad cuando conduzca.  Haga una lista de los nmeros de telfono de emergencia, que incluya los nmeros de telfono de familiares, amigos, el hospital, as como los departamentos de polica y bomberos. Instrucciones generales  Consulte a su mdico sobre los alimentos que debe comer o pdale que la ayude a encontrar a quien pueda aconsejarla si necesita ese servicio.    Consulte a su mdico acerca de dnde se dictan clases prenatales cerca de donde vive. Comience las clases antes del mes 6 de embarazo.  No se d baos de inmersin en agua caliente, baos turcos ni saunas.  No se haga duchas vaginales ni use tampones o toallas higinicas perfumadas.   No mantenga las piernas cruzadas durante mucho tiempo.  Vaya al dentista si an no lo hizo. Use un cepillo de cerdas suaves para cepillarse los dientes. Psese el hilo dental suavemente.  No fume, no consuma hierbas ni beba alcohol. No tome frmacos que el mdico no haya autorizado.  No consuma ningn producto que contenga nicotina o tabaco, como cigarrillos y cigarrillos electrnicos. Si necesita ayuda para dejar de fumar, consulte al mdico.  Evite el contacto con las bandejas sanitarias de los gatos y la tierra que estos animales usan. Estos elementos contienen bacterias que pueden causar defectos congnitos al beb y la posible prdida del beb (aborto espontneo) o la muerte fetal. Comunquese con un mdico si:  Tiene clicos leves o siente presin en la parte baja del vientre.  Tiene dolor al hacer pis (orinar).  Advierte un lquido con olor ftido que proviene de la vagina.  Tiene malestar estomacal (nuseas), devuelve (vomita) o tiene deposiciones acuosas (diarrea).  Sufre un dolor persistente en el abdomen.  Siente mareos. Solicite ayuda de inmediato si:  Tiene fiebre.  Tiene una prdida de lquido por la vagina.  Tiene sangrado o pequeas prdidas vaginales.  Siente dolor intenso o clicos en el abdomen.  Sube o baja de peso rpidamente.  Tiene dificultades para recuperar el aliento y siente dolor en el pecho.  Sbitamente se le hinchan mucho el rostro, las manos, los tobillos, los pies o las piernas.  No ha sentido los movimientos del beb durante una hora.  Siente un dolor de cabeza intenso que no se alivia al tomar medicamentos.  Tiene dificultad para ver. Resumen  El segundo trimestre va desde la semana14 hasta la 27, desde el mes 4 hasta el 6. Este suele ser el momento en el que mejor se siente.  Para cuidarse y cuidar a su beb en gestacin, debe comer alimentos saludables, tomar medicamentos solamente si su mdico le indica que lo haga y hacer  actividades que sean seguras para usted y su beb.  Llame al mdico si se enferma o si nota algo inusual acerca de su embarazo. Tambin llame al mdico si necesita ayuda para saber qu alimentos debe comer o si quiere saber qu actividades puede realizar de forma segura. Esta informacin no tiene como fin reemplazar el consejo del mdico. Asegrese de hacerle al mdico cualquier pregunta que tenga. Document Revised: 11/30/2016 Document Reviewed: 11/30/2016 Elsevier Patient Education  2020 Elsevier Inc.  

## 2019-12-19 LAB — CBC/D/PLT+RPR+RH+ABO+RUB AB...
Antibody Screen: NEGATIVE
Basophils Absolute: 0 10*3/uL (ref 0.0–0.2)
Basos: 0 %
EOS (ABSOLUTE): 0.1 10*3/uL (ref 0.0–0.4)
Eos: 1 %
HCV Ab: <0.1 s/co ratio (ref 0.0–0.9)
HIV Screen 4th Generation wRfx: NONREACTIVE
Hematocrit: 36.6 % (ref 34.0–46.6)
Hemoglobin: 12.1 g/dL (ref 11.1–15.9)
Hepatitis B Surface Ag: NEGATIVE
Immature Grans (Abs): 0.1 10*3/uL (ref 0.0–0.1)
Immature Granulocytes: 1 %
Lymphocytes Absolute: 1.6 10*3/uL (ref 0.7–3.1)
Lymphs: 21 %
MCH: 27.7 pg (ref 26.6–33.0)
MCHC: 33.1 g/dL (ref 31.5–35.7)
MCV: 84 fL (ref 79–97)
Monocytes Absolute: 0.5 10*3/uL (ref 0.1–0.9)
Monocytes: 7 %
Neutrophils Absolute: 5.5 10*3/uL (ref 1.4–7.0)
Neutrophils: 70 %
Platelets: 323 10*3/uL (ref 150–450)
RBC: 4.37 x10E6/uL (ref 3.77–5.28)
RDW: 13.3 % (ref 11.7–15.4)
RPR Ser Ql: NONREACTIVE
Rh Factor: POSITIVE
Rubella Antibodies, IGG: 9.58 index (ref >0.99)
WBC: 7.7 10*3/uL (ref 3.4–10.8)

## 2019-12-19 LAB — HEMOGLOBIN A1C
Est. average glucose Bld gHb Est-mCnc: 111 mg/dL
Hgb A1c MFr Bld: 5.5 % (ref 4.8–5.6)

## 2019-12-19 LAB — HCV INTERPRETATION

## 2020-01-08 ENCOUNTER — Other Ambulatory Visit (HOSPITAL_COMMUNITY)
Admission: RE | Admit: 2020-01-08 | Discharge: 2020-01-08 | Disposition: A | Discharge status: Home / Self Care | Payer: Self-pay | Source: Ambulatory Visit | Attending: Obstetrics & Gynecology | Admitting: Obstetrics & Gynecology

## 2020-01-08 ENCOUNTER — Ambulatory Visit (INDEPENDENT_AMBULATORY_CARE_PROVIDER_SITE_OTHER): Payer: Self-pay | Admitting: *Deleted

## 2020-01-08 ENCOUNTER — Other Ambulatory Visit: Payer: Self-pay

## 2020-01-08 VITALS — BP 120/76 | HR 91

## 2020-01-08 DIAGNOSIS — N898 Other specified noninflammatory disorders of vagina: Secondary | ICD-10-CM

## 2020-01-08 NOTE — Progress Notes (Signed)
SUBJECTIVE:  39 y.o. female complains of green vaginal discharge for few day(s). Denies abnormal vaginal bleeding or significant pelvic pain or fever. No UTI symptoms. Denies history of known exposure to STD.  Patient's last menstrual period was 09/16/2019.  OBJECTIVE:  She appears well, afebrile. Urine dipstick: not done.  ASSESSMENT:  Vaginal Discharge  Vaginal Odor   PLAN:  GC, chlamydia, trichomonas, BVAG, CVAG probe sent to lab. Treatment: To be determined once lab results are received ROV prn if symptoms persist or worsen.

## 2020-01-08 NOTE — Progress Notes (Signed)
Patient was assessed and managed by nursing staff during this encounter. I have reviewed the chart and agree with the documentation and plan. I have also made any necessary editorial changes. ° °Shavelle Runkel, MD °01/08/2020 1:50 PM °

## 2020-01-10 ENCOUNTER — Ambulatory Visit: Payer: Self-pay | Attending: Nurse Practitioner

## 2020-01-10 ENCOUNTER — Other Ambulatory Visit: Payer: Self-pay

## 2020-01-10 LAB — CERVICOVAGINAL ANCILLARY ONLY
Bacterial Vaginitis (gardnerella): NEGATIVE
Candida Glabrata: NEGATIVE
Candida Vaginitis: NEGATIVE
Chlamydia: NEGATIVE
Comment: NEGATIVE
Comment: NEGATIVE
Comment: NEGATIVE
Comment: NEGATIVE
Comment: NEGATIVE
Comment: NORMAL
Neisseria Gonorrhea: NEGATIVE
Trichomonas: NEGATIVE

## 2020-01-15 ENCOUNTER — Ambulatory Visit (INDEPENDENT_AMBULATORY_CARE_PROVIDER_SITE_OTHER): Payer: Self-pay | Admitting: Advanced Practice Midwife

## 2020-01-15 ENCOUNTER — Other Ambulatory Visit: Payer: Self-pay

## 2020-01-15 VITALS — BP 107/73 | HR 99 | Wt 172.0 lb

## 2020-01-15 DIAGNOSIS — K5900 Constipation, unspecified: Secondary | ICD-10-CM

## 2020-01-15 DIAGNOSIS — Z3492 Encounter for supervision of normal pregnancy, unspecified, second trimester: Secondary | ICD-10-CM

## 2020-01-15 DIAGNOSIS — Z3A17 17 weeks gestation of pregnancy: Secondary | ICD-10-CM

## 2020-01-15 NOTE — Progress Notes (Signed)
° °  PRENATAL VISIT NOTE  Subjective:  Tracie [name] is a 39 y.o. G2P1001 at [redacted]w[redacted]d being seen today for ongoing prenatal care.  She is currently monitored for the following issues for this low-risk pregnancy and has Preventative health care; Bilateral chronic knee pain; Vaginitis and vulvovaginitis; Abdominal pain, epigastric; Abnormal uterine bleeding; Anxiety and depression; Generalized anxiety disorder; Post traumatic stress disorder (PTSD); Flank pain; Ear pain; Muscle spasm of back; Health care maintenance; Right lower quadrant pain; Stool mucus; Dizziness; Anemia, iron deficiency; Gastritis and gastroduodenitis; Vitamin D deficiency; and Supervision of high risk pregnancy, antepartum on their problem list.  Patient reports recurrent periumbilical abdominal pain as well as constipation.  Contractions: Not present. Vag. Bleeding: None.  Denies leaking of fluid.   The following portions of the patient's history were reviewed and updated as appropriate: allergies, current medications, past family history, past medical history, past social history, past surgical history and problem list. Problem list updated.  Objective:   Vitals:   01/15/20 1304  BP: 107/73  Pulse: 99  Weight: 172 lb (78 kg)    Fetal Status: Fetal Heart Rate (bpm): 154         General:  Alert, oriented and cooperative. Patient is in no acute distress.  Skin: Skin is warm and dry. No rash noted.   Cardiovascular: Normal heart rate noted  Respiratory: Normal respiratory effort, no problems with respiration noted  Abdomen: Soft, gravid, appropriate for gestational age.  Pain/Pressure: Present     Pelvic: Cervical exam deferred        Extremities: Normal range of motion.  Edema: None  Mental Status: Normal mood and affect. Normal behavior. Normal judgment and thought content.   Assessment and Plan:  Pregnancy: G2P1001 at [redacted]w[redacted]d  1. Supervision of low-risk pregnancy, second trimester - Adopt a mom, routine care -  Aneuploidy scan scheduled today  2. [redacted] weeks gestation of pregnancy   3. Constipation, unspecified constipation type - Discussed PO hydration with water, limit caffeine, fresh fruits and vegetables - Offered Miralax, Colace BID. Pt declines, will attempt management with diet modification and discuss more next visit  Preterm labor symptoms and general obstetric precautions including but not limited to vaginal bleeding, contractions, leaking of fluid and fetal movement were reviewed in detail with the patient. Please refer to After Visit Summary for other counseling recommendations.  Return in about 4 weeks (around 02/12/2020) for Sam, CNM please.   Calvert Cantor, CNM

## 2020-01-15 NOTE — Patient Instructions (Addendum)
Segundo trimestre de embarazo Second Trimester of Pregnancy  El segundo trimestre va desde la semana14 hasta la 27 (desde el mes 4 hasta el 6). Este suele ser el momento en el que mejor se siente. En general, las nuseas matutinas han disminuido o han desaparecido completamente. Tendr ms energa y podr aumentarle el apetito. El beb en gestacin se desarrolla rpidamente. Hacia el final del sexto mes, el beb mide aproximadamente 9 pulgadas (23 cm) y pesa alrededor de 1 libras (700 g). Es probable que sienta al beb moverse entre las 18 y 20 semanas del embarazo. Siga estas indicaciones en su casa: Medicamentos  Tome los medicamentos de venta libre y los recetados solamente como se lo haya indicado el mdico. Algunos medicamentos son seguros para tomar durante el embarazo y otros no lo son.  Tome vitaminas prenatales que contengan por lo menos 600microgramos (?g) de cido flico.  Si tiene dificultad para mover el intestino (estreimiento), tome un medicamento para ablandar las heces (laxante) si su mdico se lo autoriza. Comida y bebida   Ingiera alimentos saludables de manera regular.  No coma carne cruda ni quesos sin cocinar.  Si obtiene poca cantidad de calcio de los alimentos que ingiere, consulte a su mdico sobre la posibilidad de tomar un suplemento diario de calcio.  Evite el consumo de alimentos ricos en grasas y azcares, como los alimentos fritos y los dulces.  Si tiene malestar estomacal (nuseas) o devuelve (vomita): ? Ingiera 4 o 5comidas pequeas por da en lugar de 3abundantes. ? Intente comer algunas galletitas saladas. ? Beba lquidos entre las comidas, en lugar de hacerlo durante estas.  Para evitar el estreimiento: ? Consuma alimentos ricos en fibra, como frutas y verduras frescas, cereales integrales y frijoles. ? Beba suficiente lquido para mantener el pis (orina) claro o de color amarillo plido. Actividad  Haga ejercicios solamente como se lo haya  indicado el mdico. Interrumpa la actividad fsica si comienza a tener calambres.  No haga ejercicio si hace demasiado calor, hay demasiada humedad o se encuentra en un lugar de mucha altura (altitud alta).  Evite levantar pesos excesivos.  Use zapatos con tacones bajos. Mantenga una buena postura al sentarse y pararse.  Puede continuar teniendo relaciones sexuales, a menos que el mdico le indique lo contrario. Alivio del dolor y del malestar  Use un sostn que le brinde buen soporte si sus mamas estn sensibles.  Dese baos de asiento con agua tibia para aliviar el dolor o las molestias causadas por las hemorroides. Use una crema para las hemorroides si el mdico la autoriza.  Descanse con las piernas elevadas si tiene calambres o dolor de cintura.  Si desarrolla venas hinchadas y abultadas (vrices) en las piernas: ? Use medias de compresin o medias de descanso como se lo haya indicado el mdico. ? Levante (eleve) los pies durante 15minutos, 3 o 4veces por da. ? Limite el consumo de sal en sus alimentos. Cuidado prenatal  Escriba sus preguntas. Llvelas cuando concurra a las visitas prenatales.  Concurra a todas las visitas prenatales como se lo haya indicado el mdico. Esto es importante. Seguridad  Colquese el cinturn de seguridad cuando conduzca.  Haga una lista de los nmeros de telfono de emergencia, que incluya los nmeros de telfono de familiares, amigos, el hospital, as como los departamentos de polica y bomberos. Instrucciones generales  Consulte a su mdico sobre los alimentos que debe comer o pdale que la ayude a encontrar a quien pueda aconsejarla si necesita ese servicio.    Consulte a su mdico acerca de dnde se dictan clases prenatales cerca de donde vive. Comience las clases antes del mes 6 de embarazo.  No se d baos de inmersin en agua caliente, baos turcos ni saunas.  No se haga duchas vaginales ni use tampones o toallas higinicas  perfumadas.  No mantenga las piernas cruzadas durante [place].  Vaya al dentista si an no lo hizo. Use un cepillo de cerdas suaves para cepillarse los dientes. Psese el hilo dental suavemente.  No fume, no consuma hierbas ni beba alcohol. No tome frmacos que el mdico no haya autorizado.  No consuma ningn producto que contenga nicotina o tabaco, como cigarrillos y Administrator, [employer]. Si necesita ayuda para dejar de fumar, consulte al mdico.  Evite el contacto con las bandejas sanitarias de los gatos y la tierra que estos animales usan. Estos elementos contienen bacterias que pueden causar defectos congnitos al beb y la posible prdida del beb (aborto espontneo) o la muerte fetal. Comunquese con un mdico si:  Tiene clicos leves o siente presin en la parte baja del vientre.  Tiene dolor al hacer pis (orinar).  Advierte un lquido con olor ftido que proviene de la vagina.  Tiene Programme researcher, broadcasting/film/video (nuseas), devuelve (vomita) o tiene deposiciones acuosas (diarrea).  Sufre un dolor persistente en el abdomen.  Siente mareos. Solicite ayuda de inmediato si:  Tiene fiebre.  Tiene una prdida de lquido por la vagina.  Tiene sangrado o pequeas prdidas vaginales.  Siente dolor intenso o clicos en el abdomen.  Sube o baja de peso rpidamente.  Tiene dificultades para recuperar el aliento y siente dolor en el pecho.  Sbitamente se le hinchan mucho el rostro, las [place], los tobillos, los pies o las piernas.  No ha sentido los movimientos del beb durante Georgianne Fick.  Siente un dolor de cabeza intenso que no se alivia al tomar [employer].  Tiene dificultad para ver. Resumen  El segundo trimestre va desde la semana14 hasta la 27, desde el mes 4 hasta el 6. Este suele ser el momento en el que mejor se siente.  Para cuidarse y cuidar a su beb en gestacin, debe comer alimentos saludables, tomar medicamentos solamente si su mdico le indica que lo haga y  hacer actividades que sean seguras para usted y su beb.  Llame al mdico si se enferma o si nota algo inusual acerca de su embarazo. Tambin llame al mdico si necesita ayuda para saber qu alimentos debe comer o si quiere saber qu actividades puede realizar de forma segura. Esta informacin no tiene Theme park manager el consejo del mdico. Asegrese de hacerle al mdico cualquier pregunta que tenga. Document Revised: 11/30/2016 Document Reviewed: 11/30/2016 Elsevier Patient Education  2020 [employer].    Estreimiento en los adultos Constipation, Adult Se llama estreimiento cuando:  Tiene deposiciones (defeca) una menor cantidad de veces a la semana de lo normal.  Tiene dificultad para defecar.  Las heces son secas y duras o son ms grandes que lo normal. Siga estas indicaciones en su casa: Comida y bebida   Consuma alimentos con alto contenido de [place], por ejemplo: ? Nils Pyle y verduras frescas. ? Cereales integrales. ? Frijoles.  Consuma una menor cantidad de alimentos ricos en grasas, con bajo contenido de [place] o excesivamente procesados, como: ? Papas fritas. ? Hamburguesas. ? Galletas. ? Caramelos. ? Gaseosas.  Beba suficiente lquido para mantener el pis (orina) claro o de color amarillo plido. Instrucciones generales  Haga actividad fsica con regularidad o  segn las indicaciones del mdico.  Vaya al bao cuando sienta la necesidad de defecar. No se aguante las ganas.  Tome los medicamentos de venta libre y los recetados solamente como se lo haya indicado el mdico. Estos incluyen los suplementos de [place].  Realice ejercicios de reentrenamiento del suelo plvico, como: ? Respirar profundamente mientras relaja la parte inferior del vientre (abdomen). ? Relajar el suelo plvico mientras defeca.  Controle su afeccin para ver si hay cambios.  Concurra a todas las visitas de control como se lo haya indicado el mdico. Esto es importante. Comunquese con  un mdico si:  Siente un dolor que empeora.  Tiene fiebre.  No ha defecado por 4das.  Vomita.  No tiene hambre.  Pierde peso.  Tiene una hemorragia en el ano.  Las deposiciones Charity fundraiser) son delgadas como un lpiz. Solicite ayuda de inmediato si:  Lance Muss, y los sntomas empeoran de repente.  Tiene prdida de materia fecal u observa [employer].  Siente el vientre ms duro o ms grande de lo normal (est hinchado).  Siente un dolor muy intenso en el vientre.  Se siente mareado o se desmaya. Esta informacin no tiene Theme park manager el consejo del mdico. Asegrese de hacerle al mdico cualquier pregunta que tenga. Document Revised: 06/08/2016 Document Reviewed: 08/26/2015 Elsevier Patient Education  2020 [employer].

## 2020-01-27 ENCOUNTER — Ambulatory Visit: Payer: Self-pay | Admitting: Nurse Practitioner

## 2020-02-06 ENCOUNTER — Encounter: Payer: Self-pay | Admitting: Radiology

## 2020-02-12 ENCOUNTER — Ambulatory Visit (INDEPENDENT_AMBULATORY_CARE_PROVIDER_SITE_OTHER): Payer: Self-pay | Admitting: Advanced Practice Midwife

## 2020-02-12 ENCOUNTER — Other Ambulatory Visit: Payer: Self-pay

## 2020-02-12 VITALS — BP 124/80 | HR 101 | Wt 176.0 lb

## 2020-02-12 DIAGNOSIS — O26892 Other specified pregnancy related conditions, second trimester: Secondary | ICD-10-CM

## 2020-02-12 DIAGNOSIS — Z789 Other specified health status: Secondary | ICD-10-CM

## 2020-02-12 DIAGNOSIS — R102 Pelvic and perineal pain: Secondary | ICD-10-CM

## 2020-02-12 DIAGNOSIS — Z7189 Other specified counseling: Secondary | ICD-10-CM

## 2020-02-12 DIAGNOSIS — Z3492 Encounter for supervision of normal pregnancy, unspecified, second trimester: Secondary | ICD-10-CM

## 2020-02-12 NOTE — Progress Notes (Signed)
   PRENATAL VISIT NOTE  Subjective:  Tracie [name] is a 39 y.o. G2P1001 at [redacted]w[redacted]d being seen today for ongoing prenatal care.  She is currently monitored for the following issues for this low-risk pregnancy and has Preventative health care; Bilateral chronic knee pain; Vaginitis and vulvovaginitis; Abdominal pain, epigastric; Abnormal uterine bleeding; Anxiety and depression; Generalized anxiety disorder; Post traumatic stress disorder (PTSD); Flank pain; Ear pain; Muscle spasm of back; Health care maintenance; Right lower quadrant pain; Stool mucus; Dizziness; Anemia, iron deficiency; Gastritis and gastroduodenitis; Vitamin D deficiency; and Supervision of high risk pregnancy, antepartum on their problem list.  Patient reports recurrent pelvic pain and urinary frequency, unchanged from previous.  Contractions: Irritability. Vag. Bleeding: None.  Movement: Present. Denies leaking of fluid.   The following portions of the patient's history were reviewed and updated as appropriate: allergies, current medications, past family history, past medical history, past social history, past surgical history and problem list. Problem list updated.  Objective:   Vitals:   02/12/20 1443  BP: 124/80  Pulse: (!) 101  Weight: 176 lb (79.8 kg)    Fetal Status: Fetal Heart Rate (bpm): 153   Movement: Present     General:  Alert, oriented and cooperative. Patient is in no acute distress.  Skin: Skin is warm and dry. No rash noted.   Cardiovascular: Normal heart rate noted  Respiratory: Normal respiratory effort, no problems with respiration noted  Abdomen: Soft, gravid, appropriate for gestational age.  Pain/Pressure: Present     Pelvic: Cervical exam deferred        Extremities: Normal range of motion.  Edema: None  Mental Status: Normal mood and affect. Normal behavior. Normal judgment and thought content.   Assessment and Plan:  Pregnancy: G2P1001 at [redacted]w[redacted]d  1. Supervision of low-risk pregnancy,  second trimester - Routine care - Repeat ultrasound for completion of fetal anatomy in December. Reassured this is normal when fetal positioning provides suboptimal views   2. Language barrier affecting health care - Interpreter Geanie Cooley present for all patient interaction  3. Pelvic pain affecting pregnancy in second trimester, antepartum - Clean urine dip today - Advised maternity belt, patient plans to buy on [employer]  Preterm labor symptoms and general obstetric precautions including but not limited to vaginal bleeding, contractions, leaking of fluid and fetal movement were reviewed in detail with the patient. Please refer to After Visit Summary for other counseling recommendations.  Return in about 4 weeks (around 03/11/2020).  Future Appointments  Date Time Provider Department Center  02/17/2020  9:00 AM [facility] None  03/11/2020  1:30 PM Calvert Cantor, CNM [facility]    Calvert Cantor, [place]

## 2020-02-12 NOTE — Patient Instructions (Signed)
Segundo trimestre de embarazo Second Trimester of Pregnancy  El segundo trimestre va desde la semana14 hasta la 27 (desde el mes 4 hasta el 6). Este suele ser el momento en el que mejor se siente. En general, las nuseas matutinas han disminuido o han desaparecido completamente. Tendr ms energa y podr aumentarle el apetito. El beb en gestacin se desarrolla rpidamente. Hacia el final del sexto mes, el beb mide aproximadamente 9 pulgadas (23 cm) y pesa alrededor de 1 libras (700 g). Es probable que sienta al beb moverse entre las 18 y 20 semanas del embarazo. Siga estas indicaciones en su casa: Medicamentos  Tome los medicamentos de venta libre y los recetados solamente como se lo haya indicado el mdico. Algunos medicamentos son seguros para tomar durante el embarazo y otros no lo son.  Tome vitaminas prenatales que contengan por lo menos 600microgramos (?g) de cido flico.  Si tiene dificultad para mover el intestino (estreimiento), tome un medicamento para ablandar las heces (laxante) si su mdico se lo autoriza. Comida y bebida   Ingiera alimentos saludables de manera regular.  No coma carne cruda ni quesos sin cocinar.  Si obtiene poca cantidad de calcio de los alimentos que ingiere, consulte a su mdico sobre la posibilidad de tomar un suplemento diario de calcio.  Evite el consumo de alimentos ricos en grasas y azcares, como los alimentos fritos y los dulces.  Si tiene malestar estomacal (nuseas) o devuelve (vomita): ? Ingiera 4 o 5comidas pequeas por da en lugar de 3abundantes. ? Intente comer algunas galletitas saladas. ? Beba lquidos entre las comidas, en lugar de hacerlo durante estas.  Para evitar el estreimiento: ? Consuma alimentos ricos en fibra, como frutas y verduras frescas, cereales integrales y frijoles. ? Beba suficiente lquido para mantener el pis (orina) claro o de color amarillo plido. Actividad  Haga ejercicios solamente como se lo haya  indicado el mdico. Interrumpa la actividad fsica si comienza a tener calambres.  No haga ejercicio si hace demasiado calor, hay demasiada humedad o se encuentra en un lugar de mucha altura (altitud alta).  Evite levantar pesos excesivos.  Use zapatos con tacones bajos. Mantenga una buena postura al sentarse y pararse.  Puede continuar teniendo relaciones sexuales, a menos que el mdico le indique lo contrario. Alivio del dolor y del malestar  Use un sostn que le brinde buen soporte si sus mamas estn sensibles.  Dese baos de asiento con agua tibia para aliviar el dolor o las molestias causadas por las hemorroides. Use una crema para las hemorroides si el mdico la autoriza.  Descanse con las piernas elevadas si tiene calambres o dolor de cintura.  Si desarrolla venas hinchadas y abultadas (vrices) en las piernas: ? Use medias de compresin o medias de descanso como se lo haya indicado el mdico. ? Levante (eleve) los pies durante 15minutos, 3 o 4veces por da. ? Limite el consumo de sal en sus alimentos. Cuidado prenatal  Escriba sus preguntas. Llvelas cuando concurra a las visitas prenatales.  Concurra a todas las visitas prenatales como se lo haya indicado el mdico. Esto es importante. Seguridad  Colquese el cinturn de seguridad cuando conduzca.  Haga una lista de los nmeros de telfono de emergencia, que incluya los nmeros de telfono de familiares, amigos, el hospital, as como los departamentos de polica y bomberos. Instrucciones generales  Consulte a su mdico sobre los alimentos que debe comer o pdale que la ayude a encontrar a quien pueda aconsejarla si necesita ese servicio.    Consulte a su mdico acerca de dnde se dictan clases prenatales cerca de donde vive. Comience las clases antes del mes 6 de embarazo.  No se d baos de inmersin en agua caliente, baos turcos ni saunas.  No se haga duchas vaginales ni use tampones o toallas higinicas perfumadas.   No mantenga las piernas cruzadas durante mucho tiempo.  Vaya al dentista si an no lo hizo. Use un cepillo de cerdas suaves para cepillarse los dientes. Psese el hilo dental suavemente.  No fume, no consuma hierbas ni beba alcohol. No tome frmacos que el mdico no haya autorizado.  No consuma ningn producto que contenga nicotina o tabaco, como cigarrillos y cigarrillos electrnicos. Si necesita ayuda para dejar de fumar, consulte al mdico.  Evite el contacto con las bandejas sanitarias de los gatos y la tierra que estos animales usan. Estos elementos contienen bacterias que pueden causar defectos congnitos al beb y la posible prdida del beb (aborto espontneo) o la muerte fetal. Comunquese con un mdico si:  Tiene clicos leves o siente presin en la parte baja del vientre.  Tiene dolor al hacer pis (orinar).  Advierte un lquido con olor ftido que proviene de la vagina.  Tiene malestar estomacal (nuseas), devuelve (vomita) o tiene deposiciones acuosas (diarrea).  Sufre un dolor persistente en el abdomen.  Siente mareos. Solicite ayuda de inmediato si:  Tiene fiebre.  Tiene una prdida de lquido por la vagina.  Tiene sangrado o pequeas prdidas vaginales.  Siente dolor intenso o clicos en el abdomen.  Sube o baja de peso rpidamente.  Tiene dificultades para recuperar el aliento y siente dolor en el pecho.  Sbitamente se le hinchan mucho el rostro, las manos, los tobillos, los pies o las piernas.  No ha sentido los movimientos del beb durante una hora.  Siente un dolor de cabeza intenso que no se alivia al tomar medicamentos.  Tiene dificultad para ver. Resumen  El segundo trimestre va desde la semana14 hasta la 27, desde el mes 4 hasta el 6. Este suele ser el momento en el que mejor se siente.  Para cuidarse y cuidar a su beb en gestacin, debe comer alimentos saludables, tomar medicamentos solamente si su mdico le indica que lo haga y hacer  actividades que sean seguras para usted y su beb.  Llame al mdico si se enferma o si nota algo inusual acerca de su embarazo. Tambin llame al mdico si necesita ayuda para saber qu alimentos debe comer o si quiere saber qu actividades puede realizar de forma segura. Esta informacin no tiene como fin reemplazar el consejo del mdico. Asegrese de hacerle al mdico cualquier pregunta que tenga. Document Revised: 11/30/2016 Document Reviewed: 11/30/2016 Elsevier Patient Education  2020 Elsevier Inc.  

## 2020-02-17 ENCOUNTER — Other Ambulatory Visit: Payer: Self-pay

## 2020-02-17 DIAGNOSIS — Z20822 Contact with and (suspected) exposure to covid-19: Secondary | ICD-10-CM

## 2020-02-19 LAB — SARS-COV-2, NAA 2 DAY TAT

## 2020-02-19 LAB — NOVEL CORONAVIRUS, NAA: SARS-CoV-2, NAA: NOT DETECTED

## 2020-03-11 ENCOUNTER — Ambulatory Visit (INDEPENDENT_AMBULATORY_CARE_PROVIDER_SITE_OTHER): Payer: Self-pay | Admitting: Advanced Practice Midwife

## 2020-03-11 ENCOUNTER — Other Ambulatory Visit: Payer: Self-pay

## 2020-03-11 VITALS — BP 124/70 | HR 108 | Wt 182.0 lb

## 2020-03-11 DIAGNOSIS — R42 Dizziness and giddiness: Secondary | ICD-10-CM

## 2020-03-11 DIAGNOSIS — Z3492 Encounter for supervision of normal pregnancy, unspecified, second trimester: Secondary | ICD-10-CM

## 2020-03-11 DIAGNOSIS — Z789 Other specified health status: Secondary | ICD-10-CM

## 2020-03-11 DIAGNOSIS — Z3A25 25 weeks gestation of pregnancy: Secondary | ICD-10-CM

## 2020-03-11 NOTE — Patient Instructions (Signed)
Teste de tolerncia  glicose durante a gravidez Glucose Tolerance Test During Pregnancy Por que estou fazendo este teste? O teste de tolerncia  glicose (TTG)  feito para verificar como seu corpo processa o acar (glicose). Esse  um dos vrios testes utilizados para diagnosticar o diabetes que se desenvolve durante a gravidez (diabetes mellitus gestacional). O diabetes gestacional  uma forma temporria de diabetes que algumas mulheres manifestam durante a gravidez. Geralmente ocorre durante o segundo trimestre da [place] e desaparece aps o parto. O teste (triagem) para diabetes gestacional geralmente ocorre entre as semanas 24 e 28 da [place]. Voc pode fazer o teste TTG depois de fazer um teste de glicose de 1 hora se os resultados desse teste indicarem que voc pode ter diabetes gestacional. Voc tambm pode fazer este teste se:  Tiver histrico de diabetes gestacional.  Tiver histrico de nascimento de bebs muito grandes ou tiver sofrido perda recorrente de fetos (parto de natimorto).  Apresentar sinais e sintomas de diabetes, tais como: ? Alteraes da viso. ? Formigamento ou dormncia nas mos ou ps. ? Alteraes na fome, sede e mico que no explicadas pela gravidez. O que  examinado? Esse teste mede a quantidade de glicose no sangue em momentos diferentes durante um perodo de 3 horas. Isso indica o quanto seu corpo consegue processar a glicose. Que tipo de [place]  coletada?  Amostras de sangue so necessrias para esse teste. Geralmente, so coletados [employer] insero de uma agulha em um vaso sanguneo. Como devo me preparar para esse teste?  Nos 3 dias anteriores ao exame, coma normalmente. Consuma bastante alimentos ricos em carboidratos.  Siga as instrues do seu mdico sobre: ? Restries de comidas ou bebidas no dia do exame. Voc pode ser orientado a no comer ou beber nada alm de gua (jejum) a partir de 8-10 horas antes do teste. ? Alterao ou  interrupo dos seus medicamentos regulares. Alguns medicamentos podem interferir nesse teste. Informe seu mdico sobre:  Todos os medicamentos que [employer], inclusive vitaminas, fitoterpicos, colrios, cremes e medicamentos de venda sem receita mdica.  Quaisquer doenas sanguneas.  Cirurgias anteriores.  Quaisquer quadros clnicos. O que acontece durante o exame? Primeiro, sua glicose sangunea ser [employer]. Isso  chamado de glicose sangunea de jejum, j que voc estava em jejum antes do exame. Depois, voc vai beber uma soluo de glicose que contm uma certa quantidade de [place]. Sua glicose sangunea ser medida novamente 1, 2 e 3 horas depois que voc beber a soluo. Esse teste leva cerca de 3 horas para ser concludo. Voc precisar permanecer no local do teste durante todo esse perodo. Durante o perodo do teste:  No coma ou beba nada que no seja a soluo de glicose.  No faa exerccio.  No use nenhum produto que contenha nicotina ou tabaco, como cigarros tradicionais e cigarros eletrnicos. Caso precise de ajuda para parar, fale com seu mdico. O procedimento do teste pode variar de acordo com o mdico e o hospital. [place] os resultados so relatados? Seus resultados sero relatados em miligramas de glicose por decilitro de sangue (mg/dl) ou milimoles por litro (mmol/l). Seu mdico ir comparar seus resultados com os intervalos normais que foram estabelecidos aps o teste de um grande grupo de pessoas (intervalos de referncia). Os intervalos de referncia podem variar de acordo com o laboratrio e o hospital. Para esse teste, os intervalos de referncia comuns so:  Jejum: menos de 95-105 mg/dl (6,2-8,3 mmol/l).  1 hora depois de beber a glicose: menos de 180-190  mg/dl (00,1-74,9 mmol/l).  2 horas depois de beber glicose: menos de 155-165 mg/dl (4,4-9,6 mmol/l).  3 horas depois de beber glicose: 140-145 mg/dl (7,5-9,1 mmol/l). O que os resultados significam? Os  resultados [place] intervalos de referncia so considerados normais, o que significa que os seus nveis de glicose esto bem controlados. Se duas ou mais medies de sua glicose sangunea estiverem altas, voc pode ser diagnosticada com diabetes gestacional. Se apenas uma medio estiver alta, seu mdico poder sugerir repetio do exame ou outros testes para confirmar o diagnstico. [place] com seu mdico sobre o que significam seus [place]. Perguntas a fazer ao seu mdico Pergunte ao mdico ou ao departamento que realizou o exame:  Quando meus resultados estaro prontos?  Como devo retirar [employer]?  Quais so as opes de tratamento?  Quais outros exames eu preciso fazer?  Quais so os prximos passos? Resumo  O teste de tolerncia  glicose (TTG)  um de diversos testes usados no diagnstico do diabetes durante a gravidez (diabetes mellitus gestacional). O diabetes gestacional  uma forma temporria de diabetes que algumas mulheres manifestam durante a gravidez.  Voc pode fazer o teste TTG depois de fazer um teste de glicose de 1 hora se os resultados desse teste indicarem que voc pode ter diabetes gestacional. Voc tambm pode fazer esse teste se tiver algum sintoma ou fator de risco para diabetes gestacional.  Converse com seu mdico sobre o que significam seus [place]. Estas informaes no se destinam a substituir as recomendaes de seu mdico. No deixe de discutir quaisquer dvidas com seu mdico. Document Revised: 01/03/2017 Document Reviewed: 01/03/2017 Elsevier Patient Education  2020 [employer].

## 2020-03-11 NOTE — Progress Notes (Signed)
   PRENATAL VISIT NOTE  Subjective:  Tracie [name] is a 39 y.o. G2P1001 at [redacted]w[redacted]d being seen today for ongoing prenatal care.  She is currently monitored for the following issues for this low-risk pregnancy and has Preventative health care; Bilateral chronic knee pain; Vaginitis and vulvovaginitis; Abdominal pain, epigastric; Abnormal uterine bleeding; Anxiety and depression; Generalized anxiety disorder; Post traumatic stress disorder (PTSD); Flank pain; Ear pain; Muscle spasm of back; Health care maintenance; Right lower quadrant pain; Stool mucus; Dizziness; Anemia, iron deficiency; Gastritis and gastroduodenitis; Vitamin D deficiency; and Supervision of high risk pregnancy, antepartum on their problem list.  Patient reports new onset mild dizziness for about one week. Typically occurs in conjunction with physical exertion..  Contractions: Irritability. Vag. Bleeding: None.  Movement: Present. Denies leaking of fluid.   The following portions of the patient's history were reviewed and updated as appropriate: allergies, current medications, past family history, past medical history, past social history, past surgical history and problem list. Problem list updated.  Objective:   Vitals:   03/11/20 1330  BP: 124/70  Pulse: (!) 108  Weight: 182 lb (82.6 kg)    Fetal Status: Fetal Heart Rate (bpm): 163   Movement: Present     General:  Alert, oriented and cooperative. Patient is in no acute distress.  Skin: Skin is warm and dry. No rash noted.   Cardiovascular: Normal heart rate noted  Respiratory: Normal respiratory effort, no problems with respiration noted  Abdomen: Soft, gravid, appropriate for gestational age.  Pain/Pressure: Present     Pelvic: Cervical exam deferred        Extremities: Normal range of motion.  Edema: Trace  Mental Status: Normal mood and affect. Normal behavior. Normal judgment and thought content.   Assessment and Plan:  Pregnancy: G2P1001 at [redacted]w[redacted]d  1.  Supervision of low-risk pregnancy, second trimester - Adopt a mom, s/p ultrasound for completion of heart views on 12/13. Records requested - Preemptive teaching GTT next visit  2. Dizziness - Encouraged snacking, hydration, limit caffeine, slow position changes - Hgb at new OB 12.1 (12/18/2019), reassured will recheck with GTT next visit  3. Language barrier affecting health care - Interpreter Geanie Cooley present for entire visit  Preterm labor symptoms and general obstetric precautions including but not limited to vaginal bleeding, contractions, leaking of fluid and fetal movement were reviewed in detail with the patient. Please refer to After Visit Summary for other counseling recommendations.  Return in about 3 weeks (around 04/01/2020) for Fasting GTT next visit.  Future Appointments  Date Time Provider Department Center  04/02/2020  8:15 AM Clancy Bing, MD [facility]    Calvert Cantor, CNM

## 2020-03-19 ENCOUNTER — Encounter: Payer: Self-pay | Admitting: Radiology

## 2020-03-19 ENCOUNTER — Ambulatory Visit (INDEPENDENT_AMBULATORY_CARE_PROVIDER_SITE_OTHER): Payer: Self-pay | Admitting: Family Medicine

## 2020-03-19 ENCOUNTER — Other Ambulatory Visit: Payer: Self-pay

## 2020-03-19 ENCOUNTER — Other Ambulatory Visit (HOSPITAL_COMMUNITY)
Admission: RE | Admit: 2020-03-19 | Discharge: 2020-03-19 | Disposition: A | Discharge status: Home / Self Care | Payer: Self-pay | Source: Ambulatory Visit | Attending: Family Medicine | Admitting: Family Medicine

## 2020-03-19 VITALS — BP 119/79 | HR 109 | Wt 179.0 lb

## 2020-03-19 DIAGNOSIS — O26893 Other specified pregnancy related conditions, third trimester: Secondary | ICD-10-CM | POA: Insufficient documentation

## 2020-03-19 DIAGNOSIS — O0990 Supervision of high risk pregnancy, unspecified, unspecified trimester: Secondary | ICD-10-CM

## 2020-03-19 DIAGNOSIS — N898 Other specified noninflammatory disorders of vagina: Secondary | ICD-10-CM

## 2020-03-19 NOTE — Progress Notes (Signed)
   PRENATAL VISIT NOTE  Subjective:  Tracie [name] is a 39 y.o. G2P1001 at [redacted]w[redacted]d being seen today for ongoing prenatal care.  She is currently monitored for the following issues for this low-risk pregnancy and has Bilateral chronic knee pain; Anxiety and depression; Generalized anxiety disorder; and Supervision of high risk pregnancy, antepartum on their problem list.  Patient reports brown/spotting after sex. - having some cramping starting after sex. Had sex Monday and then cramping started on Tuesday. Has not worsened. Reports associated SOB and tiredness.   Contractions: Irritability.  .  Movement: Present. Denies leaking of fluid.   The following portions of the patient's history were reviewed and updated as appropriate: allergies, current medications, past family history, past medical history, past social history, past surgical history and problem list.   Objective:   Vitals:   03/19/20 1321  BP: 119/79  Pulse: (!) 109  Weight: 179 lb (81.2 kg)    Fetal Status: Fetal Heart Rate (bpm): 154   Movement: Present     General:  Alert, oriented and cooperative. Patient is in no acute distress.  Skin: Skin is warm and dry. No rash noted.   Cardiovascular: Normal heart rate noted  Respiratory: Normal respiratory effort, no problems with respiration noted  Abdomen: Soft, gravid, appropriate for gestational age.  Pain/Pressure: Present     Pelvic:  Speculum exam- closed cervix. No bleeding present. Minimal yellow fluid. No pooling.   Extremities: Normal range of motion.  Edema: None  Mental Status: Normal mood and affect. Normal behavior. Normal judgment and thought content.   Assessment and Plan:  Pregnancy: G2P1001 at [redacted]w[redacted]d 1. Vaginal discharge during pregnancy in third trimester Yellowish discharge on exam. Endocervical polyp seen at os (9 o'clock)  Cause of spotting likely polyp seen at os. FHR reassuring Discussed going to [facility] for worsening pain or discharge. Unlikely  preterm labor   Preterm labor symptoms and general obstetric precautions including but not limited to vaginal bleeding, contractions, leaking of fluid and fetal movement were reviewed in detail with the patient. Please refer to After Visit Summary for other counseling recommendations.   No follow-ups on file.  Future Appointments  Date Time Provider Department Center  04/02/2020  8:15 AM [place] Bing, MD [facility]    Federico Flake, MD

## 2020-03-21 NOTE — L&D Delivery Note (Signed)
Delivery Note Pt pushed x 24 minutes w/ category I tracing. At 10:24 AM a viable female was delivered via Vaginal, Spontaneous (Presentation: Right Occiput Anterior). [place] delivered easily. Large amount of thick meconium stained fluid noted w/ delivery of body. Code APGAR called at time of delivery due to thick MSF. Cord clamped and cut immediately and baby passed to NICU team. APGAR: 8, 9; weight: 6lb 7oz .  Placenta status: Spontaneous, Intact.  Cord: 3 vessels with the following complications: None.  Cord pH: NA  Anesthesia: Epidural Episiotomy: None Lacerations: None Suture Repair: NA Est. Blood Loss (mL): 200  Mom to postpartum.  Baby to Couplet care / Skin to Skin.  Dorathy Kinsman 06/14/2020, 11:14 AM

## 2020-03-22 LAB — CERVICOVAGINAL ANCILLARY ONLY
Bacterial Vaginitis (gardnerella): NEGATIVE
Candida Glabrata: NEGATIVE
Candida Vaginitis: NEGATIVE
Chlamydia: NEGATIVE
Comment: NEGATIVE
Comment: NEGATIVE
Comment: NEGATIVE
Comment: NEGATIVE
Comment: NEGATIVE
Comment: NORMAL
Neisseria Gonorrhea: NEGATIVE
Trichomonas: NEGATIVE

## 2020-04-02 ENCOUNTER — Ambulatory Visit (INDEPENDENT_AMBULATORY_CARE_PROVIDER_SITE_OTHER): Payer: Self-pay | Admitting: Obstetrics and Gynecology

## 2020-04-02 ENCOUNTER — Other Ambulatory Visit: Payer: Self-pay

## 2020-04-02 VITALS — BP 109/74 | HR 88 | Wt 179.0 lb

## 2020-04-02 DIAGNOSIS — O0990 Supervision of high risk pregnancy, unspecified, unspecified trimester: Secondary | ICD-10-CM

## 2020-04-02 DIAGNOSIS — O09523 Supervision of elderly multigravida, third trimester: Secondary | ICD-10-CM | POA: Insufficient documentation

## 2020-04-02 DIAGNOSIS — Z789 Other specified health status: Secondary | ICD-10-CM

## 2020-04-02 DIAGNOSIS — Z3A28 28 weeks gestation of pregnancy: Secondary | ICD-10-CM

## 2020-04-02 NOTE — Progress Notes (Signed)
Prenatal Visit Note Date: 04/02/2020 Clinic: [facility]  Subjective:  Tracie [name] is a 40 y.o. G2P1001 at [redacted]w[redacted]d being seen today for ongoing prenatal care.  She is currently monitored for the following issues for this low-risk pregnancy and has Bilateral chronic knee pain; Anxiety and depression; Generalized anxiety disorder; Supervision of high risk pregnancy, antepartum; Multigravida of advanced maternal age in third trimester; and Language barrier on their problem list.  Patient reports no complaints.   Contractions: Not present. Vag. Bleeding: None.  Movement: Present. Denies leaking of fluid.   The following portions of the patient's history were reviewed and updated as appropriate: allergies, current medications, past family history, past medical history, past social history, past surgical history and problem list. Problem list updated.  Objective:   Vitals:   04/02/20 0819  BP: 109/74  Pulse: 88  Weight: 179 lb (81.2 kg)    Fetal Status: Fetal Heart Rate (bpm): 154 Fundal Height: 29 cm Movement: Present     General:  Alert, oriented and cooperative. Patient is in no acute distress.  Skin: Skin is warm and dry. No rash noted.   Cardiovascular: Normal heart rate noted  Respiratory: Normal respiratory effort, no problems with respiration noted  Abdomen: Soft, gravid, appropriate for gestational age. Pain/Pressure: Present     Pelvic:  Cervical exam deferred        Extremities: Normal range of motion.  Edema: None  Mental Status: Normal mood and affect. Normal behavior. Normal judgment and thought content.   Urinalysis:      Assessment and Plan:  Pregnancy: G2P1001 at [redacted]w[redacted]d  1. Supervision of high risk pregnancy, antepartum Routine care. Normal [place] u/s in December. Rpt PRN - Glucose Tolerance, 2 Hours w/1 Hour - CBC - RPR - HIV Antibody (routine testing w rflx)  2. Language barrier Interpreter used  3. Multigravida of advanced  maternal age in third trimester No issues.   Preterm labor symptoms and general obstetric precautions including but not limited to vaginal bleeding, contractions, leaking of fluid and fetal movement were reviewed in detail with the patient. Please refer to After Visit Summary for other counseling recommendations.  Return in about 3 weeks (around 04/23/2020) for 2-3wk, in person, low risk. rob.   [place] Bing, MD

## 2020-04-03 LAB — CBC
Hematocrit: 33.2 % — ABNORMAL LOW (ref 34.0–46.6)
Hemoglobin: 11.2 g/dL (ref 11.1–15.9)
MCH: 27.9 pg (ref 26.6–33.0)
MCHC: 33.7 g/dL (ref 31.5–35.7)
MCV: 83 fL (ref 79–97)
Platelets: 261 10*3/uL (ref 150–450)
RBC: 4.02 x10E6/uL (ref 3.77–5.28)
RDW: 13 % (ref 11.7–15.4)
WBC: 6.2 10*3/uL (ref 3.4–10.8)

## 2020-04-03 LAB — RPR, QUANT+TP ABS (REFLEX)
Rapid Plasma Reagin, Quant: 1:1 {titer} — ABNORMAL HIGH
T Pallidum Abs: NONREACTIVE

## 2020-04-03 LAB — GLUCOSE TOLERANCE, 2 HOURS W/ 1HR
Glucose, 1 hour: 163 mg/dL (ref 65–179)
Glucose, 2 hour: 108 mg/dL (ref 65–152)
Glucose, Fasting: 78 mg/dL (ref 65–91)

## 2020-04-03 LAB — HIV ANTIBODY (ROUTINE TESTING W REFLEX): HIV Screen 4th Generation wRfx: NONREACTIVE

## 2020-04-03 LAB — RPR: RPR Ser Ql: REACTIVE — AB

## 2020-04-05 ENCOUNTER — Encounter: Payer: Self-pay | Admitting: Obstetrics and Gynecology

## 2020-04-05 DIAGNOSIS — R768 Other specified abnormal immunological findings in serum: Secondary | ICD-10-CM | POA: Insufficient documentation

## 2020-04-16 ENCOUNTER — Ambulatory Visit (INDEPENDENT_AMBULATORY_CARE_PROVIDER_SITE_OTHER): Payer: Self-pay | Admitting: Obstetrics and Gynecology

## 2020-04-16 ENCOUNTER — Encounter: Payer: Self-pay | Admitting: Obstetrics and Gynecology

## 2020-04-16 ENCOUNTER — Other Ambulatory Visit: Payer: Self-pay

## 2020-04-16 VITALS — BP 117/78 | HR 121 | Wt 182.0 lb

## 2020-04-16 DIAGNOSIS — R768 Other specified abnormal immunological findings in serum: Secondary | ICD-10-CM

## 2020-04-16 DIAGNOSIS — O0990 Supervision of high risk pregnancy, unspecified, unspecified trimester: Secondary | ICD-10-CM

## 2020-04-16 DIAGNOSIS — Z3A30 30 weeks gestation of pregnancy: Secondary | ICD-10-CM

## 2020-04-16 DIAGNOSIS — O09523 Supervision of elderly multigravida, third trimester: Secondary | ICD-10-CM

## 2020-04-16 DIAGNOSIS — Z789 Other specified health status: Secondary | ICD-10-CM

## 2020-04-16 NOTE — Progress Notes (Signed)
Prenatal Visit Note Date: 04/16/2020 Clinic: [facility]  Subjective:  Tracie [name] is a 40 y.o. G2P1001 at [redacted]w[redacted]d being seen today for ongoing prenatal care.  She is currently monitored for the following issues for this high-risk pregnancy and has Bilateral chronic knee pain; Anxiety and depression; Generalized anxiety disorder; Supervision of high risk pregnancy, antepartum; Multigravida of advanced maternal age in third trimester; Language barrier; and Biological false positive RPR test on their problem list.  Patient reports low back and pelvic pressure worse with movement.   Contractions: Irritability. Vag. Bleeding: None.  Movement: Present. Denies leaking of fluid.   The following portions of the patient's history were reviewed and updated as appropriate: allergies, current medications, past family history, past medical history, past social history, past surgical history and problem list. Problem list updated.  Objective:   Vitals:   04/16/20 1411  BP: 117/78  Pulse: (!) 121  Weight: 182 lb (82.6 kg)    Fetal Status: Fetal Heart Rate (bpm): 139 Fundal Height: 30 cm Movement: Present     General:  Alert, oriented and cooperative. Patient is in no acute distress.  Skin: Skin is warm and dry. No rash noted.   Cardiovascular: Normal heart rate noted  Respiratory: Normal respiratory effort, no problems with respiration noted  Abdomen: Soft, gravid, appropriate for gestational age. Pain/Pressure: Present     Pelvic:  Cervical exam deferred        Extremities: Normal range of motion.  Edema: None  Mental Status: Normal mood and affect. Normal behavior. Normal judgment and thought content.   Urinalysis:      Assessment and Plan:  Pregnancy: G2P1001 at [redacted]w[redacted]d  1. Multigravida of advanced maternal age in third trimester  2. Language barrier Interpreter used  3. Supervision of high risk pregnancy, antepartum Routine care. Pregnancy belt. Low fat foods  to aid with digestion  4. [redacted] weeks gestation of pregnancy  5. Biological false positive RPR test D/w her re: this and will rpt on L&D admit and PP PRN  Preterm labor symptoms and general obstetric precautions including but not limited to vaginal bleeding, contractions, leaking of fluid and fetal movement were reviewed in detail with the patient. Please refer to After Visit Summary for other counseling recommendations.  Return in about 2 weeks (around 04/30/2020) for 2-3wk , in person, md or app.   [place] Bing, MD

## 2020-04-30 ENCOUNTER — Ambulatory Visit (INDEPENDENT_AMBULATORY_CARE_PROVIDER_SITE_OTHER): Payer: Self-pay | Admitting: Obstetrics and Gynecology

## 2020-04-30 ENCOUNTER — Other Ambulatory Visit: Payer: Self-pay

## 2020-04-30 VITALS — BP 117/77 | HR 85 | Wt 183.0 lb

## 2020-04-30 DIAGNOSIS — Z789 Other specified health status: Secondary | ICD-10-CM

## 2020-04-30 DIAGNOSIS — Z3A32 32 weeks gestation of pregnancy: Secondary | ICD-10-CM

## 2020-04-30 DIAGNOSIS — O09523 Supervision of elderly multigravida, third trimester: Secondary | ICD-10-CM

## 2020-04-30 DIAGNOSIS — M5431 Sciatica, right side: Secondary | ICD-10-CM

## 2020-04-30 DIAGNOSIS — O0990 Supervision of high risk pregnancy, unspecified, unspecified trimester: Secondary | ICD-10-CM

## 2020-04-30 NOTE — Progress Notes (Signed)
Prenatal Visit Note Date: 04/30/2020 Clinic: [facility]  Subjective:  Tracie [name] is a 40 y.o. G2P1001 at [redacted]w[redacted]d being seen today for ongoing prenatal care.  She is currently monitored for the following issues for this low-risk pregnancy and has Bilateral chronic knee pain; Anxiety and depression; Generalized anxiety disorder; Supervision of high risk pregnancy, antepartum; Multigravida of advanced maternal age in third trimester; Language barrier; and Biological false positive RPR test on their problem list.  Patient reports right sided sciatic pain.   Contractions: Not present. Vag. Bleeding: None.  Movement: Present. Denies leaking of fluid.   The following portions of the patient's history were reviewed and updated as appropriate: allergies, current medications, past family history, past medical history, past social history, past surgical history and problem list. Problem list updated.  Objective:   Vitals:   04/30/20 1552  BP: 117/77  Pulse: 85  Weight: 183 lb (83 kg)    Fetal Status: Fetal Heart Rate (bpm): 147 Fundal Height: 33 cm Movement: Present     General:  Alert, oriented and cooperative. Patient is in no acute distress.  Skin: Skin is warm and dry. No rash noted.   Cardiovascular: Normal heart rate noted  Respiratory: Normal respiratory effort, no problems with respiration noted  Abdomen: Soft, gravid, appropriate for gestational age. Pain/Pressure: Present     Pelvic:  Cervical exam deferred        Extremities: Normal range of motion.     Mental Status: Normal mood and affect. Normal behavior. Normal judgment and thought content.   Urinalysis:      Assessment and Plan:  Pregnancy: G2P1001 at [redacted]w[redacted]d  1. Language barrier Interpreter used  2. Supervision of high risk pregnancy, antepartum Routine care  3. Right sciatic nerve pain Patient already tried pregnancy belt and no help. I recommend prenatal massages and stretches and they  can help with that as well. If it gets worse and difficulty with ambulation I told her I recommend PT eval  Preterm labor symptoms and general obstetric precautions including but not limited to vaginal bleeding, contractions, leaking of fluid and fetal movement were reviewed in detail with the patient. Please refer to After Visit Summary for other counseling recommendations.  Return in about 2 weeks (around 05/14/2020) for low risk, in person.   [place] Bing, MD

## 2020-04-30 NOTE — Patient Instructions (Signed)
sciatic stretches in pregnancy

## 2020-05-14 ENCOUNTER — Other Ambulatory Visit: Payer: Self-pay

## 2020-05-14 ENCOUNTER — Ambulatory Visit (INDEPENDENT_AMBULATORY_CARE_PROVIDER_SITE_OTHER): Payer: Self-pay | Admitting: Obstetrics & Gynecology

## 2020-05-14 ENCOUNTER — Encounter: Payer: Self-pay | Admitting: Radiology

## 2020-05-14 ENCOUNTER — Encounter: Payer: Self-pay | Admitting: Obstetrics & Gynecology

## 2020-05-14 VITALS — BP 115/74 | HR 98 | Wt 185.8 lb

## 2020-05-14 DIAGNOSIS — Z3A34 34 weeks gestation of pregnancy: Secondary | ICD-10-CM

## 2020-05-14 DIAGNOSIS — O09523 Supervision of elderly multigravida, third trimester: Secondary | ICD-10-CM

## 2020-05-14 DIAGNOSIS — O0990 Supervision of high risk pregnancy, unspecified, unspecified trimester: Secondary | ICD-10-CM

## 2020-05-14 NOTE — Progress Notes (Signed)
   PRENATAL VISIT NOTE  Subjective:  Tracie [name] is a 40 y.o. G2P1001 at [redacted]w[redacted]d being seen today for ongoing prenatal care. Patient is Spanish-speaking only, interpreter present for this encounter. She is currently monitored for the following issues for this low-risk pregnancy and has Bilateral chronic knee pain; Anxiety and depression; Generalized anxiety disorder; Supervision of high risk pregnancy, antepartum; Multigravida of advanced maternal age in third trimester; Language barrier; Biological false positive RPR test; and Sciatic pain, right on their problem list.  Patient reports no complaints.  Contractions: Irritability. Vag. Bleeding: None.  Movement: Present. Denies leaking of fluid.  Occasional tinnitus.  The following portions of the patient's history were reviewed and updated as appropriate: allergies, current medications, past family history, past medical history, past social history, past surgical history and problem list.   Objective:   Vitals:   05/14/20 1406  BP: 115/74  Pulse: 98  Weight: 185 lb 12.8 oz (84.3 kg)    Fetal Status: Fetal Heart Rate (bpm): 161 Fundal Height: 35 cm Movement: Present     General:  Alert, oriented and cooperative. Patient is in no acute distress.  Skin: Skin is warm and dry. No rash noted.   Cardiovascular: Normal heart rate noted  Respiratory: Normal respiratory effort, no problems with respiration noted  Abdomen: Soft, gravid, appropriate for gestational age.  Pain/Pressure: Present     Pelvic: Cervical exam deferred        Extremities: Normal range of motion.  Edema: None  Mental Status: Normal mood and affect. Normal behavior. Normal judgment and thought content.   Assessment and Plan:  Pregnancy: G2P1001 at [redacted]w[redacted]d 1. Multigravida of advanced maternal age in third trimester 2. [redacted] weeks gestation of pregnancy 3. Supervision of high risk pregnancy, antepartum All questions answered.  Preterm labor symptoms and general  obstetric precautions including but not limited to vaginal bleeding, contractions, leaking of fluid and fetal movement were reviewed in detail with the patient. Please refer to After Visit Summary for other counseling recommendations.   Return in about 2 weeks (around 05/28/2020) for Pelvic cultures, OFFICE OB VISIT (MD or APP).  Future Appointments  Date Time Provider Department Center  05/28/2020  2:00 PM Reva Bores, MD [facility]    Jaynie Collins, MD

## 2020-05-14 NOTE — Patient Instructions (Addendum)
Regrese a la clinica cuando tenga su cita. Si tiene problemas o preguntas, llama a la clinica o vaya a la sala de emergencia al [employer].   Eleccin del mtodo anticonceptivo Contraception Choices La anticoncepcin, o los mtodos anticonceptivos, hace referencia a los mtodos o dispositivos que evitan el [place]. Mtodos hormonales Implante anticonceptivo Un implante anticonceptivo consiste en un tubo delgado de plstico que contiene una hormona que evita el [place]. Es diferente de un dispositivo intrauterino (DIU). Un mdico lo inserta en la parte superior del brazo. Los implantes pueden ser eficaces durante un mximo de 3 aos. Inyecciones de progestina sola Las inyecciones de progestina sola contienen progestina, una forma sinttica de la hormona progesterona. Un mdico las administra cada 3 meses. Pldoras anticonceptivas Las pldoras anticonceptivas son pastillas que contienen hormonas que evitan el [place]. Deben tomarse una vez al da, preferentemente a la misma Economist. Se necesita una receta para utilizar este mtodo anticonceptivo. Parche anticonceptivo El parche anticonceptivo contiene hormonas que evitan el [place]. Se coloca en la piel, debe cambiarse una vez a la semana durante tres semanas y debe retirarse en la cuarta semana. Se necesita una receta para utilizar este mtodo anticonceptivo. Anillo vaginal Un anillo vaginal contiene hormonas que evitan el embarazo. Se coloca en la vagina durante tres semanas y se retira en la cuarta semana. Luego se repite el proceso con un anillo nuevo. Se necesita una receta para utilizar este mtodo anticonceptivo. Anticonceptivo de emergencia Los anticonceptivos de emergencia son mtodos para evitar un embarazo despus de Warehouse manager sexo sin proteccin. Vienen en forma de pldora y pueden tomarse hasta 5 das despus de [place]. Funcionan mejor cuando se toman lo ms pronto posible luego de eBay. La mayora de los  anticonceptivos de emergencia estn disponibles sin receta mdica. Este mtodo no debe utilizarse como el nico mtodo anticonceptivo.   Mtodos de barrera Condn masculino Un condn masculino es una vaina delgada que se coloca sobre el pene durante el sexo. Los condones evitan que el esperma ingrese en el cuerpo de la [place]. Pueden utilizarse con un una sustancia que mata a los espermatozoides (espermicida) para aumentar la efectividad. Deben desecharse despus de un uso. Condn femenino Un condn femenino es una vaina blanda y holgada que se coloca en la vagina antes de [place]. El condn evita que el esperma ingrese en el cuerpo de la [place]. Deben desecharse despus de un uso. Diafragma Un diafragma es una barrera blanda con forma de cpula. Se inserta en la vagina antes del sexo, junto con un espermicida. El diafragma bloquea el ingreso de esperma en el tero, y el espermicida mata a los espermatozoides. El Designer, fashion/clothing en la vagina durante 6 a 8 horas despus de Warehouse manager sexo y debe retirarse en el plazo de las 24 horas. Un diafragma es recetado y colocado por un mdico. Debe reemplazarse cada 1 a 2 aos, despus de dar a luz, de aumentar ms de 15lb (6.8kg) y de [place] plvica. Capuchn cervical Un capuchn cervical es una copa redonda y blanda de ltex o plstico que se coloca en el cuello uterino. Se inserta en la vagina antes del sexo, junto con un espermicida. Bloquea el ingreso del esperma en el tero. El capuchn Radio producer durante 6 a 8 horas despus de Warehouse manager sexo y debe retirarse en el plazo de las 48 horas. Un capuchn cervical debe ser recetado y colocado por un mdico. Debe reemplazarse cada 2aos. Esponja  Una esponja es una pieza blanda y circular de espuma de poliuretano que contiene espermicida. La esponja ayuda a bloquear el ingreso de esperma en el tero, y el espermicida mata a los espermatozoides. Belva Bertin, debe humedecerla e insertarla  en la vagina. Debe insertarse antes de eBay, debe permanecer dentro al menos durante 6 horas despus de tener sexo y debe retirarse y Nurse, adult en el plazo de las 30 horas. Espermicidas Los espermicidas son sustancias qumicas que matan o bloquean al esperma y no lo dejan ingresar al cuello uterino y al tero. Vienen en forma de crema, gel, supositorio, espuma o comprimido. Un espermicida debe insertarse en la vagina con un aplicador al menos 10 o 15 minutos antes de tener sexo para dar tiempo a que surta [place]. El proceso debe repetirse cada vez que tenga sexo. Los espermicidas no requieren Emergency planning/management officer.   Anticonceptivos intrauterinos Dispositivo intrauterino (DIU) Un DIU es un dispositivo en forma de T que se coloca en el tero. Existen dos tipos:  DIU hormonal.Este tipo contiene progestina, una forma sinttica de la hormona progesterona. Este tipo puede permanecer colocado durante 3 a 5 aos.  DIU de cobre.Este tipo est recubierto con un alambre de cobre. Puede permanecer colocado durante 10 aos. Mtodos anticonceptivos permanentes Ligadura de trompas en la mujer En este mtodo, se sellan, atan u obstruyen las trompas de [place]opio durante una ciruga para Automotive engineer que el vulo descienda [place]. Esterilizacin histeroscpica En este mtodo, se coloca un implante pequeo y flexible dentro de cada trompa de Falopio. Los implantes hacen que se forme un tejido cicatricial en las trompas de Falopio y que las obstruya para que el espermatozoide no pueda llegar al vulo. El procedimiento demora alrededor de 3 meses para que sea [place]. Debe utilizarse otro mtodo anticonceptivo durante esos 3 meses. Esterilizacin masculina Este es un procedimiento que consiste en atar los conductos que transportan el esperma (vasectoma). Luego del procedimiento, el hombre Manufacturing engineer lquido (semen). Debe utilizarse otro mtodo anticonceptivo durante 3 meses despus del procedimiento. Mtodos de  planificacin natural Planificacin familiar natural En este mtodo, la pareja no tiene [employer] la mujer podra quedar [place]. Mtodo calendario En este mtodo, la mujer realiza un seguimiento de la duracin de cada ciclo menstrual, identifica los [employer] que se puede producir un Psychiatrist y no tiene sexo durante esos [address]  [address]. Mtodo de la ovulacin En este mtodo, la pareja evita tener sexo durante la ovulacin. Mtodo sintotrmico Este mtodo implica no tener sexo durante la ovulacin. Normalmente, la mujer comprueba la ovulacin al observar cambios en su temperatura y en la consistencia del moco cervical. Mtodo posovulacin En este mtodo, la pareja espera a que finalice la ovulacin para Doctor, hospital. Dnde buscar ms informacin  Centers for Disease Control and Prevention (Centros para el Control y Psychiatrist de Event organiser): FootballExhibition.com.br Resumen  La anticoncepcin, o los mtodos anticonceptivos, hace referencia a los mtodos o dispositivos que evitan el [place].  Los mtodos anticonceptivos hormonales incluyen implantes, inyecciones, pastillas, parches, anillos vaginales y anticonceptivos de Associate Professor.  Los mtodos anticonceptivos de barrera pueden incluir condones masculinos, condones femeninos, diafragmas, capuchones cervicales, esponjas y espermicidas.  [employer] tipos de DIU (dispositivo intrauterino). Un DIU puede colocarse en el tero de una mujer para evitar el embarazo durante 3 a 5 aos.  La esterilizacin permanente puede realizarse mediante un procedimiento tanto en los hombres como en las mujeres. Los [employer] de planificacin familiar natural implican no Doctor, hospital  durante los The St. Paul Travelersdas en que la mujer podra quedar Lernaembarazada. Esta informacin no tiene Theme park managercomo fin reemplazar el consejo del mdico. Asegrese de hacerle al mdico cualquier pregunta que tenga. Document Revised: 10/08/2019 Document Reviewed: 10/08/2019 Elsevier Patient Education  2021  Elsevier Inc.   Prevencin del parto prematuro Preventing Preterm Birth Se conoce como parto prematuro al nacimiento del beb entre las semanas 20y37del embarazo. Un embarazo a trmino comprende un mnimo de 37semanas. El parto prematuro puede ser peligroso para el beb, ya que las ltimas semanas de Psychiatristembarazo representan un tiempo importante en el que el beb crece y alcanza un peso de nacimiento normal. Cmo puede afectar al beb el parto prematuro? Las complicaciones del parto prematuro pueden incluir las siguientes:  Problemas respiratorios.  Dao cerebral que afecta el movimiento y la coordinacin(parlisis cerebral).  Dificultad para alimentarse.  Problemas de visin o audicin.  Infecciones o inflamacin del tubo digestivo(colitis).  Retrasos en el desarrollo y discapacidades relacionadas con el aprendizaje.  Dole FoodBajo peso al nacer o muy bajo peso al nacer.  Mayor riesgo de padecer diabetes, enfermedades cardacas y presin arterial alta en el futuro. Qu puede aumentar mi riesgo de tener un parto prematuro? No se conoce la causa exacta de los partos prematuros. Los siguientes factores pueden hacerla ms propensa a tener un parto prematuro:  Recibir un diagnstico de placenta previa. Esta es una afeccin en la que la placenta cubre la parte inferior del tero (cuello uterino), que se abre hacia la vagina.  Ciertas afecciones del Psychiatristembarazo actual y de Proofreaderembarazos anteriores, como: ? Production managerHaber tenido un parto prematuro antes. ? Estar embarazada de ms de un beb. ? Insurance claims handlersperar menos de 6 meses entre un parto y un Psychologist, occupationalnuevo embarazo. ? Ciertas anormalidades en el beb en gestacin. ? Hemorragia vaginal durante el embarazo. ? Quedar embarazada a travs de fertilizacin in vitro (FIV).  Tener sobrepeso o Bensonbajo peso.  Antecedentes mdicos de lo siguiente: ? ITS (infecciones de transmisin sexual) u otras infecciones en las vas urinarias y la vagina. ? Enfermedades a Air cabin crewlargo plazo  (crnicas), como problemas de coagulacin sangunea, diabetes o hipertensin arterial. ? Cuello uterino corto.  Factores ambientales y del estilo de vida, como: ? Consumir drogas o productos que contengan tabaco. ? Consumo de alcohol. ? Franne Gripener estrs y no contar con apoyo social. ? Violencia en el hogar (violencia domstica). ? Estar expuesta a ciertas sustancias qumicas o contaminantes del ambiente. Qu medidas puedo tomar para evitar un parto prematuro? Atencin mdica Lo ms importante que puede hacer para disminuir el riesgo de Warehouse managertener un parto prematuro es Baristaobtener atencin mdica de rutina durante el Psychiatristembarazo (cuidado prenatal). Concurra a todas las visitas de [address] Parkwayseguimiento como se lo haya indicado el mdico. Esto es importante.  Si corre un riesgo alto de Warehouse managertener un parto prematuro:  La podran derivar a un mdico que se especialice en el control de embarazos de Conservator, museum/galleryalto riesgo (perinatlogo).  Tal vez le receten medicamentos para ayudar a Ship brokerprevenir el parto prematuro. Estilo de vida Ciertos cambios en el estilo de vida tambin pueden disminuir el riesgo de tener un parto prematuro:  Espere al menos 6 meses despus de un embarazo para volver a Scientist, research (physical sciences)quedar embarazada.  Antes de Kerry Kassquedar embarazada, logre un peso saludable. Si tiene sobrepeso, trabaje con el mdico para adelgazar sin riegos.  No consuma ningn producto que contenga nicotina o tabaco, como cigarrillos, cigarrillos electrnicos y tabaco de Theatre managermascar. Si necesita ayuda para dejar de fumar, consulte al mdico.  No bebas alcohol.  No consumas drogas.  Seguir una dieta saludable.  Controle otros problemas mdicos que tenga, como por ejemplo, diabetes o presin arterial alta.   Dnde buscar apoyo Para obtener ms ayuda, considere lo siguiente:  Hablar con el mdico.  Hablar con un terapeuta o un asesor de consumo de drogas si necesita ayuda para dejar de hacerlo.  Trabajar con un nutricionista o un entrenador fsico para Technical brewer  peso saludable.  Unirte a un grupo de apoyo. Dnde buscar ms informacin Obtenga ms informacin sobre cmo prevenir un Advice worker en los siguientes sitios:  Marine scientist for Disease Control and Prevention (Centros para el Control y la Prevencin de [place]): TonerPromos.no  March of Dimes: marchofdimes.org  American Pregnancy Association (Asociacin Estadounidense del Embarazo): americanpregnancy.org Comunquese con un mdico si: Tiene cualquiera de estos signos o sntomas de trabajo de parto prematuro antes de las 37semanas:  Cambio o aumento de la secrecin vaginal.  Prdida de lquido por la vagina.  Presin o calambres en la parte inferior del abdomen.  Dolor de espalda que no se calma o empeora.  Endurecimiento regular (contracciones) en la parte inferior del abdomen. Solicite ayuda de inmediato si:  Tiene contracciones dolorosas y regulares cada 5 minutos o menos.  Rompe la bolsa. Resumen  Parto prematuro significa tener al beb durante las semanas 20a37 del [place].  El parto prematuro podra poner en riesgo de padecer problemas fsicos y mentales al beb.  No se conoce la causa exacta de los partos prematuros. Sin embargo, un diagnstico de placenta previa o tener sangrado vaginal o una ITS (infeccin de transmisin sexual) aumentan el riesgo de parto prematuro.  Obtener un buen cuidado prenatal puede ayudar a Ship broker. Concurra a todas las visitas de [address] se lo haya indicado el mdico. Esto es importante.  Comunquese con un mdico si tiene signos o los sntomas de trabajo de [employer]. Esta informacin no tiene Theme park manager el consejo del mdico. Asegrese de hacerle al mdico cualquier pregunta que tenga. Document Revised: 04/11/2019 Document Reviewed: 04/11/2019 Elsevier Patient Education  2021 [employer]. Regrese a la clinica cuando tenga su cita. Si tiene problemas o preguntas, llama a la clinica o vaya a la  sala de emergencia al [employer].

## 2020-05-19 ENCOUNTER — Encounter (HOSPITAL_COMMUNITY): Payer: Self-pay | Admitting: Obstetrics and Gynecology

## 2020-05-19 ENCOUNTER — Observation Stay (HOSPITAL_COMMUNITY)
Admission: AD | Admit: 2020-05-19 | Discharge: 2020-05-20 | Disposition: A | Discharge status: Home / Self Care | Payer: Medicaid Other | Attending: Obstetrics & Gynecology | Admitting: Obstetrics & Gynecology

## 2020-05-19 ENCOUNTER — Other Ambulatory Visit: Payer: Self-pay

## 2020-05-19 ENCOUNTER — Inpatient Hospital Stay (HOSPITAL_BASED_OUTPATIENT_CLINIC_OR_DEPARTMENT_OTHER): Payer: Self-pay

## 2020-05-19 DIAGNOSIS — Z20822 Contact with and (suspected) exposure to covid-19: Secondary | ICD-10-CM | POA: Insufficient documentation

## 2020-05-19 DIAGNOSIS — O26893 Other specified pregnancy related conditions, third trimester: Secondary | ICD-10-CM | POA: Diagnosis not present

## 2020-05-19 DIAGNOSIS — R58 Hemorrhage, not elsewhere classified: Secondary | ICD-10-CM

## 2020-05-19 DIAGNOSIS — Z3A35 35 weeks gestation of pregnancy: Secondary | ICD-10-CM | POA: Diagnosis not present

## 2020-05-19 DIAGNOSIS — O4693 Antepartum hemorrhage, unspecified, third trimester: Secondary | ICD-10-CM | POA: Diagnosis present

## 2020-05-19 DIAGNOSIS — O209 Hemorrhage in early pregnancy, unspecified: Secondary | ICD-10-CM

## 2020-05-19 DIAGNOSIS — Z7982 Long term (current) use of aspirin: Secondary | ICD-10-CM | POA: Insufficient documentation

## 2020-05-19 DIAGNOSIS — O26853 Spotting complicating pregnancy, third trimester: Principal | ICD-10-CM | POA: Insufficient documentation

## 2020-05-19 DIAGNOSIS — Z3689 Encounter for other specified antenatal screening: Secondary | ICD-10-CM

## 2020-05-19 DIAGNOSIS — Z6790 Unspecified blood type, Rh positive: Secondary | ICD-10-CM

## 2020-05-19 DIAGNOSIS — O4690 Antepartum hemorrhage, unspecified, unspecified trimester: Secondary | ICD-10-CM

## 2020-05-19 DIAGNOSIS — Z3A38 38 weeks gestation of pregnancy: Secondary | ICD-10-CM | POA: Insufficient documentation

## 2020-05-19 LAB — CBC
HCT: 33.5 % — ABNORMAL LOW (ref 36.0–46.0)
Hemoglobin: 11.2 g/dL — ABNORMAL LOW (ref 12.0–15.0)
MCH: 29 pg (ref 26.0–34.0)
MCHC: 33.4 g/dL (ref 30.0–36.0)
MCV: 86.8 fL (ref 80.0–100.0)
Platelets: 252 10*3/uL (ref 150–400)
RBC: 3.86 MIL/uL — ABNORMAL LOW (ref 3.87–5.11)
RDW: 14.3 % (ref 11.5–15.5)
WBC: 7.5 10*3/uL (ref 4.0–10.5)
nRBC: 0 % (ref 0.0–0.2)

## 2020-05-19 LAB — URINALYSIS, ROUTINE W REFLEX MICROSCOPIC
Bilirubin Urine: NEGATIVE
Glucose, UA: NEGATIVE mg/dL
Ketones, ur: NEGATIVE mg/dL
Leukocytes,Ua: NEGATIVE
Nitrite: NEGATIVE
Protein, ur: NEGATIVE mg/dL
Specific Gravity, Urine: 1.008 (ref 1.005–1.030)
pH: 8 (ref 5.0–8.0)

## 2020-05-19 LAB — TYPE AND SCREEN
ABO/RH(D): O POS
Antibody Screen: NEGATIVE

## 2020-05-19 LAB — RESP PANEL BY RT-PCR (FLU A&B, COVID) ARPGX2
Influenza A by PCR: NEGATIVE
Influenza B by PCR: NEGATIVE
SARS Coronavirus 2 by RT PCR: NEGATIVE

## 2020-05-19 LAB — WET PREP, GENITAL
Clue Cells Wet Prep HPF POC: NONE SEEN
Sperm: NONE SEEN
Trich, Wet Prep: NONE SEEN
Yeast Wet Prep HPF POC: NONE SEEN

## 2020-05-19 LAB — POCT FERN TEST: POCT Fern Test: NEGATIVE

## 2020-05-19 MED ORDER — PRENATAL MULTIVITAMIN CH
1.0000 | ORAL_TABLET | Freq: Every day | ORAL | Status: DC
  Administered 2020-05-19: 1 via ORAL
  Filled 2020-05-19: qty 1

## 2020-05-19 MED ORDER — ACETAMINOPHEN 325 MG PO TABS
650.0000 mg | ORAL_TABLET | ORAL | Status: DC | PRN
  Administered 2020-05-19: 650 mg via ORAL
  Filled 2020-05-19: qty 2

## 2020-05-19 MED ORDER — CALCIUM CARBONATE ANTACID 500 MG PO CHEW: 2.0000 | CHEWABLE_TABLET | ORAL | Status: DC | PRN

## 2020-05-19 MED ORDER — DOCUSATE SODIUM 100 MG PO CAPS
100.0000 mg | ORAL_CAPSULE | Freq: Every day | ORAL | Status: DC
  Administered 2020-05-19: 100 mg via ORAL
  Filled 2020-05-19: qty 1

## 2020-05-19 NOTE — MAU Provider Note (Signed)
History     CSN: [id number]  Arrival date and time: 05/19/20 0235   Event Date/Time   First Provider Initiated Contact with Patient 05/19/20 0305      Chief Complaint  Patient presents with  . Vaginal Bleeding   40 y.o. G2P1001 @35 .1 wks presenting with VB. Reports seeing red blood on the toilet tissue when she went to the BR about 1 hr ago. She reports onset of ctx yesterday afternoon. She is unsure of frequency. Had sex last >24 hr ago. +FM. Also reports vaginal itching and malodor x5 days. Denies gush of fluid but states she has been leaking for about 1 month.   OB History    Gravida  2   Para  1   Term  1   Preterm      AB      Living  1     SAB  0   IAB  0   Ectopic      Multiple      Live Births  1           Past Medical History:  Diagnosis Date  . Anxiety   . Depression   . Headache     History reviewed. No pertinent surgical history.  Family History  Problem Relation Age of Onset  . Hypertension Neg Hx     Social History   Tobacco Use  . Smoking status: Never Smoker  . Smokeless tobacco: Never Used  Vaping Use  . Vaping Use: Never used  Substance Use Topics  . Alcohol use: No  . Drug use: No    Allergies: No Known Allergies  Medications Prior to Admission  Medication Sig Dispense Refill Last Dose  . aspirin EC 81 MG tablet Take 1 tablet (81 mg total) by mouth daily. Take after 12 weeks for prevention of preeclampsia later in pregnancy 300 tablet 2 05/18/2020 at Unknown time  . Prenatal Vit-Fe Fumarate-FA (MULTIVITAMIN-PRENATAL) 27-0.8 MG TABS tablet Take 1 tablet by mouth daily at 12 noon.   05/18/2020 at Unknown time  . acetaminophen (TYLENOL) 325 MG tablet Take 2 tablets (650 mg total) by mouth every 4 (four) hours as needed. 100 tablet 2   . omeprazole (PRILOSEC) 20 MG capsule Take 1 capsule (20 mg total) by mouth daily. 30 capsule 0   . promethazine (PHENERGAN) 25 MG tablet Take 1 tablet (25 mg total) by mouth every 6 (six)  hours as needed for nausea or vomiting. (Patient not taking: Reported on 04/16/2020) 30 tablet 2   . sertraline (ZOLOFT) 50 MG tablet Take 1 tablet (50 mg total) by mouth daily. 30 tablet 3   . Vitamin D, Ergocalciferol, (DRISDOL) 1.25 MG (50000 UNIT) CAPS capsule Take 1 capsule (50,000 Units total) by mouth every 7 (seven) days. 4 capsule 1     Review of Systems  Gastrointestinal: Positive for abdominal pain.  Genitourinary: Positive for vaginal bleeding.   Physical Exam   Pulse 87, temperature 98 F (36.7 C), temperature source Oral, resp. rate 20, height 5\' 5"  (1.651 m), weight 84.5 kg, last menstrual period 09/16/2019, SpO2 99 %.  Physical Exam Vitals and nursing note reviewed. Exam conducted with a chaperone present.  Constitutional:      General: She is not in acute distress.    Appearance: Normal appearance.  HENT:     Head: Normocephalic and atraumatic.  Cardiovascular:     Rate and Rhythm: Normal rate.  Pulmonary:     Effort: Pulmonary effort is normal.  No respiratory distress.  Abdominal:     Palpations: Abdomen is soft.     Tenderness: There is no abdominal tenderness.     Comments: gravid  Genitourinary:    Comments: External: no lesions or erythema Vagina: rugated, pink, moist, small amt thin dark bloody discharge; no pool, fern neg Cervix 1.5/50/-3, vtx  Musculoskeletal:        General: Normal range of motion.     Cervical back: Normal range of motion.  Skin:    General: Skin is warm and dry.  Neurological:     General: No focal deficit present.     Mental Status: She is alert.  Psychiatric:        Mood and Affect: Mood normal.        Behavior: Behavior normal.   EFM: 135 bpm, mod variability, + accels, no decels Toco: 3-4  Results for orders placed or performed during the hospital encounter of 05/19/20 (from the past 24 hour(s))  Urinalysis, Routine w reflex microscopic Urine, Clean Catch     Status: Abnormal   Collection Time: 05/19/20  3:00 AM   Result Value Ref Range   Color, Urine STRAW (A) YELLOW   APPearance CLEAR CLEAR   Specific Gravity, Urine 1.008 1.005 - 1.030   pH 8.0 5.0 - 8.0   Glucose, UA NEGATIVE NEGATIVE mg/dL   Hgb urine dipstick MODERATE (A) NEGATIVE   Bilirubin Urine NEGATIVE NEGATIVE   Ketones, ur NEGATIVE NEGATIVE mg/dL   Protein, ur NEGATIVE NEGATIVE mg/dL   Nitrite NEGATIVE NEGATIVE   Leukocytes,Ua NEGATIVE NEGATIVE   RBC / HPF 0-5 0 - 5 RBC/hpf   WBC, UA 0-5 0 - 5 WBC/hpf   Bacteria, UA RARE (A) NONE SEEN   Squamous Epithelial / LPF 0-5 0 - 5   Mucus PRESENT   Wet prep, genital     Status: Abnormal   Collection Time: 05/19/20  3:22 AM  Result Value Ref Range   Yeast Wet Prep HPF POC NONE SEEN NONE SEEN   Trich, Wet Prep NONE SEEN NONE SEEN   Clue Cells Wet Prep HPF POC NONE SEEN NONE SEEN   WBC, Wet Prep HPF POC MANY (A) NONE SEEN   Sperm NONE SEEN   Fern Test     Status: None   Collection Time: 05/19/20  4:18 AM  Result Value Ref Range   POCT Fern Test Negative = intact amniotic membranes   Type and screen [facility]     Status: None (Preliminary result)   Collection Time: 05/19/20  4:27 AM  Result Value Ref Range   ABO/RH(D) PENDING    Antibody Screen PENDING    Sample Expiration      05/22/2020,2359 Performed at [facility], [address] [place], [place] [address]      Media Information         MAU Course  Procedures  MDM Review of prenatal record shows uncomplicated pregnancy. Labs and [place] ordered and reviewed. No signs of PTL. [place] normal. Pt feeling occasional ctx, rare on toco. Consult with Dr. Earlene Plater, plan for 23 hr obs.  Assessment and Plan   1. [redacted] weeks gestation of pregnancy   2. Vaginal bleeding in pregnancy, third trimester   3. Blood type, Rh positive    Admit to OBSC unit Continuous EFM Mngt per Dr. Earlene Plater  Video interpreter used for encounter  Donette Larry, CNM 05/19/2020, 4:44 AM

## 2020-05-19 NOTE — Plan of Care (Signed)
Pt stated feeling anxious this morning, RN educated on fetal monitoring and and signs and symptoms to report. Pt states feeling better.   Problem: Education: Goal: Knowledge of disease or condition will improve Outcome: Progressing   Problem: Coping: Goal: Level of anxiety will decrease Outcome: Progressing

## 2020-05-19 NOTE — Plan of Care (Signed)
  Problem: Education: Goal: Knowledge of disease or condition will improve Outcome: Progressing Goal: Knowledge of the prescribed therapeutic regimen will improve Outcome: Progressing Goal: Individualized Educational Video(s) Outcome: Progressing   Problem: Clinical Measurements: Goal: Complications related to the disease process, condition or treatment will be avoided or minimized Outcome: Progressing   

## 2020-05-19 NOTE — MAU Note (Signed)
Pt presents to MAU c/o light vaginal bleeding when she wipes after using the bathroom, and mild abdominal pain, pt describes as braxton hicks contractions intermittent. Pt denies LOF.  Pt endorses +FM today. Pt reports having intercourse on sunday.

## 2020-05-19 NOTE — H&P (Signed)
OBSTETRIC ADMISSION HISTORY AND PHYSICAL  Tracie [name] is a 40 y.o. female G2P1001 with IUP at [redacted]w[redacted]d presenting for VB. She reports +FMs. No LOF. She plans on breast and bottlefeeding. She requests POPs for birth control.  Dating: By LMP --->  Estimated Date of Delivery: 06/22/20  Sono:    @[redacted]w[redacted]d , normal anatomy, 607g, 34%ile   Prenatal History/Complications: - language barrier - false + RPR  Past Medical History: Past Medical History:  Diagnosis Date  . Anxiety   . Depression   . Headache     Past Surgical History: History reviewed. No pertinent surgical history.  Obstetrical History: OB History    Gravida  2   Para  1   Term  1   Preterm      AB      Living  1     SAB  0   IAB  0   Ectopic      Multiple      Live Births  1           Social History: Social History   Socioeconomic History  . Marital status: Single    Spouse name: Not on file  . Number of children: Not on file  . Years of education: Not on file  . Highest education level: Not on file  Occupational History  . Not on file  Tobacco Use  . Smoking status: Never Smoker  . Smokeless tobacco: Never Used  Vaping Use  . Vaping Use: Never used  Substance and Sexual Activity  . Alcohol use: No  . Drug use: No  . Sexual activity: Yes    Birth control/protection: Condom  Other Topics Concern  . Not on file  Social History Narrative  . Not on file   Social Determinants of Health   Financial Resource Strain: Not on file  Food Insecurity: Not on file  Transportation Needs: Not on file  Physical Activity: Not on file  Stress: Not on file  Social Connections: Not on file    Family History: Family History  Problem Relation Age of Onset  . Hypertension Neg Hx     Allergies: No Known Allergies  Medications Prior to Admission  Medication Sig Dispense Refill Last Dose  . aspirin EC 81 MG tablet Take 1 tablet (81 mg total) by mouth daily. Take after 12 weeks for  prevention of preeclampsia later in pregnancy 300 tablet 2 05/18/2020 at Unknown time  . Prenatal Vit-Fe Fumarate-FA (MULTIVITAMIN-PRENATAL) 27-0.8 MG TABS tablet Take 1 tablet by mouth daily at 12 noon.   05/18/2020 at Unknown time  . acetaminophen (TYLENOL) 325 MG tablet Take 2 tablets (650 mg total) by mouth every 4 (four) hours as needed. 100 tablet 2   . omeprazole (PRILOSEC) 20 MG capsule Take 1 capsule (20 mg total) by mouth daily. 30 capsule 0   . promethazine (PHENERGAN) 25 MG tablet Take 1 tablet (25 mg total) by mouth every 6 (six) hours as needed for nausea or vomiting. (Patient not taking: Reported on 04/16/2020) 30 tablet 2   . sertraline (ZOLOFT) 50 MG tablet Take 1 tablet (50 mg total) by mouth daily. 30 tablet 3   . Vitamin D, Ergocalciferol, (DRISDOL) 1.25 MG (50000 UNIT) CAPS capsule Take 1 capsule (50,000 Units total) by mouth every 7 (seven) days. 4 capsule 1      Review of Systems:  All systems reviewed and negative except as stated in HPI  PE: Pulse 87, temperature 98 F (36.7 C), temperature  source Oral, resp. rate 20, height 5\' 5"  (1.651 m), weight 84.5 kg, last menstrual period 09/16/2019, SpO2 99 %. General appearance: alert, cooperative and no distress Lungs: regular rate and effort Heart: regular rate  Abdomen: soft, non-tender Extremities: Homans sign is negative, no sign of DVT Presentation: cephalic EFM: 135 bpm, mod variability, + accels, no decels Toco: rare Dilation: 1.5 Effacement (%): Thick Exam by:: [email or web address], CNM  Prenatal labs: ABO, Rh: O/Positive/-- (09/29 1456) Antibody: Negative (09/29 1456) Rubella: 9.58 (09/29 1456) RPR: Reactive (01/13 0925)  HBsAg: Negative (09/29 1456)  HIV: Non Reactive (01/13 0925)  GBS:   unknown 2 hr GTT nml  Prenatal Transfer Tool  Maternal Diabetes: No Genetic Screening: Declined Maternal Ultrasounds/Referrals: Normal Fetal Ultrasounds or other Referrals:  None Maternal Substance Abuse:   No Significant Maternal Medications:  None Significant Maternal Lab Results: None  Results for orders placed or performed during the hospital encounter of 05/19/20 (from the past 24 hour(s))  Urinalysis, Routine w reflex microscopic Urine, Clean Catch   Collection Time: 05/19/20  3:00 AM  Result Value Ref Range   Color, Urine STRAW (A) YELLOW   APPearance CLEAR CLEAR   Specific Gravity, Urine 1.008 1.005 - 1.030   pH 8.0 5.0 - 8.0   Glucose, UA NEGATIVE NEGATIVE mg/dL   Hgb urine dipstick MODERATE (A) NEGATIVE   Bilirubin Urine NEGATIVE NEGATIVE   Ketones, ur NEGATIVE NEGATIVE mg/dL   Protein, ur NEGATIVE NEGATIVE mg/dL   Nitrite NEGATIVE NEGATIVE   Leukocytes,Ua NEGATIVE NEGATIVE   RBC / HPF 0-5 0 - 5 RBC/hpf   WBC, UA 0-5 0 - 5 WBC/hpf   Bacteria, UA RARE (A) NONE SEEN   Squamous Epithelial / LPF 0-5 0 - 5   Mucus PRESENT   Wet prep, genital   Collection Time: 05/19/20  3:22 AM  Result Value Ref Range   Yeast Wet Prep HPF POC NONE SEEN NONE SEEN   Trich, Wet Prep NONE SEEN NONE SEEN   Clue Cells Wet Prep HPF POC NONE SEEN NONE SEEN   WBC, Wet Prep HPF POC MANY (A) NONE SEEN   Sperm NONE SEEN   Fern Test   Collection Time: 05/19/20  4:18 AM  Result Value Ref Range   POCT Fern Test Negative = intact amniotic membranes     Patient Active Problem List   Diagnosis Date Noted  . Sciatic pain, right 04/30/2020  . Biological false positive RPR test 04/05/2020  . Multigravida of advanced maternal age in third trimester 04/02/2020  . Language barrier 04/02/2020  . Supervision of high risk pregnancy, antepartum 12/18/2019  . Anxiety and depression 06/12/2014  . Generalized anxiety disorder 06/12/2014  . Bilateral chronic knee pain 10/28/2013    Assessment: [redacted] weeks gestation Reactive NST Vaginal bleeding Rh positive   Plan: Obs to [facility] unit Continuous EFM Mngt per Dr[facility], CNM  05/19/2020, 4:24 AM

## 2020-05-20 ENCOUNTER — Observation Stay (HOSPITAL_BASED_OUTPATIENT_CLINIC_OR_DEPARTMENT_OTHER): Payer: Self-pay

## 2020-05-20 DIAGNOSIS — O09523 Supervision of elderly multigravida, third trimester: Secondary | ICD-10-CM

## 2020-05-20 DIAGNOSIS — Z3A35 35 weeks gestation of pregnancy: Secondary | ICD-10-CM

## 2020-05-20 DIAGNOSIS — O4693 Antepartum hemorrhage, unspecified, third trimester: Secondary | ICD-10-CM

## 2020-05-20 NOTE — Progress Notes (Signed)
FACULTY PRACTICE ANTEPARTUM(COMPREHENSIVE) NOTE  Tracie [name] is a 40 y.o. G2P1001 with Estimated Date of Delivery: 06/22/20  At [redacted]w[redacted]d  who is admitted for vaginal bleeding.    Fetal presentation is cephalic. Length of Stay:  1  Days  Date of admission:05/19/2020  Subjective: She reports no issues overnight- bleeding has improved.  On occasion may note brown spotting when she wipes.  Denies bright red blood Patient reports the fetal movement as active. Patient reports uterine contraction  activity as none. Patient describes fluid per vagina as None.  Vitals:  Blood pressure 115/71, pulse 79, temperature 98.1 F (36.7 C), temperature source Oral, resp. rate 18, height 5\' 5"  (1.651 m), weight 84.5 kg, last menstrual period 09/16/2019, SpO2 98 %. Vitals:   05/19/20 1935 05/19/20 2305 05/20/20 0336 05/20/20 0734  BP: 132/73 116/76 111/62 115/71  Pulse: (!) 102 89 79 79  Resp: 18 18 16 18   Temp: 98.4 F (36.9 C) 98.3 F (36.8 C) 97.6 F (36.4 C) 98.1 F (36.7 C)  TempSrc: Oral Oral Oral Oral  SpO2: 99% 99% 100% 98%  Weight:      Height:       Physical Examination:  General appearance - alert, well appearing, and in no distress Chest - CTAB Heart - normal rate and regular rhythm Abdomen - gravid, soft, non-tender GU: Normal external genitalia, pink moist vaginal mucosa, ~ 5cc of dark brown discharge/blood noted in vault- cervix visualized- appears ~1cm, no active bleeding noted SVE: 1/long/high, vertex, no bleeding appreciated Extremities - no edema, no calf tenderness bilaterally Skin - warm and dary  Fetal Monitoring:  Baseline: 140 bpm, Variability: moderate, Accelerations: present and Decelerations: no decels currently, occasional variable decel noted overnight   Reactive NST this am  Labs: n/a  Imaging Studies:    07/20/20 MFM OB LIMITED  Result Date: 05/19/2020 ----------------------------------------------------------------------  OBSTETRICS REPORT                        (Signed Final 05/19/2020 08:20 am) ---------------------------------------------------------------------- Patient Info  ID #:       07/19/2020                          D.O.B.:  Jan 13, 1981 (40 yrs)  Name:       [id number] Tracie [name]            Visit Date: 05/19/2020 03:38 am ---------------------------------------------------------------------- Performed By  Attending:        Aniceto Boss MD        Ref. Address:     [address]                                                             [place], [address]  [address]  Performed By:     Marcellina Millin          Secondary Phy.:   [facility] MAU/Triage                    RDMS  Referred By:      Vikki Ports              [facility] ---------------------------------------------------------------------- Orders  #  Description                           Code        Ordered By  1  [place] MFM OB LIMITED                     76815.01    MELANIE BHAMBRI ----------------------------------------------------------------------  #  Order #                     Accession #                Episode #  1  [id number]                   [id number]                 [id number] ---------------------------------------------------------------------- Indications  Vaginal bleeding in pregnancy, third trimester O46.93  [redacted] weeks gestation of pregnancy                Z3A.35 ---------------------------------------------------------------------- Fetal Evaluation  Num Of Fetuses:         1  Fetal Heart Rate(bpm):  152  Cardiac Activity:       Observed  Presentation:           Cephalic  Placenta:               Posterior  P. Cord Insertion:      Not well visualized  Amniotic Fluid  AFI FV:      Within normal limits  AFI Sum(cm)     %Tile       Largest Pocket(cm)  10.7            26          3.62  RUQ(cm)       RLQ(cm)        LUQ(cm)        LLQ(cm)  3.05          3.62          1.63           2.4  Comment:    No placental abruption or previa identified. ---------------------------------------------------------------------- OB History  Gravidity:    2         Term:   1        Prem:   0        SAB:   0  TOP:          0       Ectopic:  0        Living: 1 ---------------------------------------------------------------------- Gestational Age  LMP:  35w 1d        Date:  09/16/19                 EDD:   06/22/20  Best:          35w 1d     Det. By:  LMP  (09/16/19)          EDD:   06/22/20 ---------------------------------------------------------------------- Anatomy  Stomach:               Appears normal, left   Bladder:                Appears normal                         sided ---------------------------------------------------------------------- Cervix Uterus Adnexa  Cervix  Not visualized (advanced GA >24wks)  Uterus  No abnormality visualized.  Adnexa  No abnormality visualized. ---------------------------------------------------------------------- Impression  Patient was evaluated for c/o vaginal bleeding .  A limited ultrasound study was performed .Amniotic fluid is  normal and good fetal activity is seen .  Placenta looks normal with no evidence of abruption.  Ultrasound has limitations in diagnosing placental abruption .  No evidence of placenta previa. ----------------------------------------------------------------------                  Noralee Space, MD Electronically Signed Final Report   05/19/2020 08:20 am ----------------------------------------------------------------------    Medications:  Scheduled . docusate sodium  100 mg Oral Daily  . prenatal multivitamin  1 tablet Oral Q1200   I have reviewed the patient's current medications.  ASSESSMENT: G2P1001 [redacted]w[redacted]d Estimated Date of Delivery: 06/22/20  Patient Active Problem List   Diagnosis Date Noted  . Vaginal bleeding in pregnancy, third trimester  05/19/2020  . Sciatic pain, right 04/30/2020  . Biological false positive RPR test 04/05/2020  . Multigravida of advanced maternal age in third trimester 04/02/2020  . Language barrier 04/02/2020  . Supervision of high risk pregnancy, antepartum 12/18/2019  . Anxiety and depression 06/12/2014  . Generalized anxiety disorder 06/12/2014  . Bilateral chronic knee pain 10/28/2013    PLAN: -Vaginal bleeding- improved, no evidence of preterm labor -FWB- NST reactive, due to occasional variable, BPP completed this am, 8/8, [place] as above  DISPO: Plan for discharge home on pelvic rest.  Reviewed labor room precautions, kick counts and advised to f/u for scheduled appointment  Alessandra Bevels Natale Barba 05/20/2020,11:16 AM

## 2020-05-20 NOTE — Discharge Summary (Signed)
Physician Discharge Summary  Patient ID: Tracie [name] MRN: [id number] DOB/AGE: [birth year] 40 y.o.  Admit date: 05/19/2020 Discharge date: 05/20/2020  Admission Diagnoses: Vaginal bleeding in pregnancy  Discharge Diagnoses:  Active Problems:   Vaginal bleeding in pregnancy, third trimester   Discharged Condition: stable  Hospital Course: 40yo G2P1001@[redacted]w[redacted]d  admitted for observation due to vaginal bleeding.  Pt was closely monitored.  No evidence of abruption or preterm labor.  BPP 8/8, reactive NST.  Plan for discharge home with outpatient follow up  Consults: None  Significant Diagnostic Studies: labs: none   Treatments: IV hydration and BPP  Discharge Exam: Blood pressure 108/65, pulse 85, temperature 98 F (36.7 C), temperature source Oral, resp. rate 19, height 5\' 5"  (1.651 m), weight 84.5 kg, last menstrual period 09/16/2019, SpO2 99 %. Physical Examination:  General appearance - alert, well appearing, and in no distress Chest - CTAB Heart - normal rate and regular rhythm Abdomen - gravid, soft, non-tender GU: Normal external genitalia, pink moist vaginal mucosa, ~ 5cc of dark brown discharge/blood noted in vault- cervix visualized- appears ~1cm, no active bleeding noted SVE: 1/long/high, vertex, no bleeding appreciated Extremities - no edema, no calf tenderness bilaterally Skin - warm and dary  Fetal Monitoring:  Baseline: 140 bpm, Variability: moderate, Accelerations: present and Decelerations: no decels currently, occasional variable decel noted overnight   Reactive NST this am Disposition: Discharge disposition: 01-Home or Self Care       Discharge Instructions    Discharge activity:  No Restrictions   Complete by: As directed    Discharge diet:  No restrictions   Complete by: As directed    Do not have sex or do anything that might make you have an orgasm   Complete by: As directed    Fetal Kick Count:  Lie on our left side for one hour after a meal,  and count the number of times your baby kicks.  If it is less than 5 times, get up, move around and drink some juice.  Repeat the test 30 minutes later.  If it is still less than 5 kicks in an hour, notify your doctor.   Complete by: As directed    Notify physician for a general feeling that "something is not right"   Complete by: As directed    Notify physician for increase or change in vaginal discharge   Complete by: As directed    Notify physician for intestinal cramps, with or without diarrhea, sometimes described as "gas pain"   Complete by: As directed    Notify physician for leaking of fluid   Complete by: As directed    Notify physician for low, dull backache, unrelieved by heat or Tylenol   Complete by: As directed    Notify physician for menstrual like cramps   Complete by: As directed    Notify physician for pelvic pressure   Complete by: As directed    Notify physician for uterine contractions.  These may be painless and feel like the uterus is tightening or the baby is  "balling up"   Complete by: As directed    Notify physician for vaginal bleeding   Complete by: As directed    PRETERM LABOR:  Includes any of the follwing symptoms that occur between 20 - [redacted] weeks gestation.  If these symptoms are not stopped, preterm labor can result in preterm delivery, placing your baby at risk   Complete by: As directed      Allergies as of 05/20/2020  No Known Allergies     Medication List    TAKE these medications   acetaminophen 325 MG tablet Commonly known as: Tylenol Take 2 tablets (650 mg total) by mouth every 4 (four) hours as needed.   aspirin EC 81 MG tablet Take 1 tablet (81 mg total) by mouth daily. Take after 12 weeks for prevention of preeclampsia later in pregnancy   multivitamin-prenatal 27-0.8 MG Tabs tablet Take 1 tablet by mouth daily at 12 noon.   omeprazole 20 MG capsule Commonly known as: PRILOSEC Take 1 capsule (20 mg total) by mouth daily.    promethazine 25 MG tablet Commonly known as: PHENERGAN Take 1 tablet (25 mg total) by mouth every 6 (six) hours as needed for nausea or vomiting.   sertraline 50 MG tablet Commonly known as: ZOLOFT Take 1 tablet (50 mg total) by mouth daily.   Vitamin D (Ergocalciferol) 1.25 MG (50000 UNIT) Caps capsule Commonly known as: DRISDOL Take 1 capsule (50,000 Units total) by mouth every 7 (seven) days.       Follow-up Information    [facility] Follow up.   Specialty: Obstetrics and Gynecology Why: As scheduled for next OB visit Contact information: [address] [place] [address] [phone]              Signed: Sharon Seller 05/20/2020, 12:04 PM

## 2020-05-20 NOTE — Plan of Care (Signed)
  Problem: Activity: Goal: Risk for activity intolerance will decrease Outcome: Completed/Met   Problem: Nutrition: Goal: Adequate nutrition will be maintained Outcome: Completed/Met   Problem: Coping: Goal: Level of anxiety will decrease Outcome: Completed/Met   Problem: Elimination: Goal: Will not experience complications related to urinary retention Outcome: Completed/Met   Problem: Clinical Measurements: Goal: Respiratory complications will improve Outcome: Not Applicable Goal: Cardiovascular complication will be avoided Outcome: Not Applicable

## 2020-05-20 NOTE — Discharge Instructions (Signed)
Parto prematuro Preterm Labor El embarazo tiene generalmente una duracin de 39 a 41 semanas. Se llama trabajo de parto prematuro cuando este se inicia antes de las 37 semanas de [facility]. Los bebs que nacen antes de tiempo pueden tener problemas con el azcar en la sangre, la temperatura corporal, el corazn y la respiracin. Estos problemas pueden ser muy graves en los bebs que nacen antes de las 34semanas de [place]. Cules son las causas? Se desconoce la causa de esta afeccin. Qu incrementa el riesgo? Es ms probable que tenga un trabajo de parto prematuro si:  Tiene problemas de salud o los [place].  Tiene problemas en el embarazo en curso o los tuvo en embarazos anteriores.  Tiene problemas de estilo de vida. Antecedentes mdicos  Tiene problemas en el tero.  Tiene una infeccin, incluidas las que se contagian a travs de las [employer].  Tiene problemas que no se resuelven, por ejemplo: ? Cogulos de sangre. ? Presin arterial alta. ? Nivel elevado de azcar en la sangre.  Tiene un Runner, broadcasting/film/video bajo o demasiado peso corporal. Psychiatrist en curso y Insurance underwriter anteriores  Ha tenido trabajo de parto prematuro anteriormente.  Est embarazada de dos o ms bebs.  Tiene una afeccin en la que la placenta cubre el cuello uterino.  Esper menos de 6 meses entre un parto y un Psychologist, occupational.  El beb en gestacin tiene algunos problemas.  Tiene sangrado vaginal.  Qued embarazada por un mtodo llamado fertilizacin in vitro (FIV). Estilo de vida  Fuma.  Bebe alcohol.  Consume drogas.  Tiene estrs.  Recibe Chief Strategy Officer.  Entra en contacto con sustancias qumicas que daan el cuerpo (contaminantes). Otros factores  Es Adult nurse de 17aos o mayor de 35aos. Cules son los signos o sntomas? Los sntomas de esta afeccin incluyen:  [place]. Los calambres pueden sentirse como clicos de un perodo menstrual.  Puede presentar heces lquidas  (diarrea).  Dolor de vientre (abdomen).  Dolor en la zona lumbar.  Contracciones regulares. Siente como si el vientre se endurece.  Presin en la parte baja del vientre.  Ms prdida de lquido de la vagina. El lquido puede ser acuoso o con [place].  Ruptura de la bolsa de aguas. Cmo se trata? El tratamiento de [address] afeccin depende de su salud, la salud del beb y el tiempo que lleva de [place]. Puede incluir lo siguiente:  Tomar medicamentos como, por ejemplo: ? Medicamentos hormonales. ? Medicamentos para TEFL teacher las contracciones. ? Medicamentos que ayudan a [employer] del beb. ? Medicamentos para evitar que el beb tenga parlisis cerebral.  Reposo en cama. Si el trabajo de parto se inicia antes de las 34semanas de [place], es posible que deba hospitalizarse.  Producir el nacimiento del beb. Siga estas instrucciones en su casa:  No consuma ningn producto que contenga nicotina o tabaco, como cigarrillos, cigarrillos electrnicos y tabaco de Theatre manager. Si necesita ayuda para dejar de consumir estos productos, consulte al mdico.  No beba alcohol.  Use los medicamentos de venta libre y los recetados solamente como se lo haya indicado el mdico.  Haga reposo como se lo haya indicado el mdico.  Regrese a sus actividades segn las indicaciones del mdico. Pregntele al mdico qu actividades son seguras para usted.  Concurra a todas las visitas de [address] se lo haya indicado el mdico. Esto es importante.   Cmo se previene? Para tener un embarazo saludable:  No consuma drogas.  No tome ningn medicamento si el mdico  no se lo indic.  Consulte al mdico antes de empezar a tomar cualquier suplemento de hierbas.  Asegrese de aumentar de peso como corresponde.  Tenga cuidado con las infecciones. Si cree que puede tener una infeccin, consulte al mdico para que la revisen inmediatamente. Los sntomas de infeccin pueden incluir los  siguientes: ? Fiebre. ? Secrecin vaginal. ? Dolor o ardor al hacer pis. ? Necesidad urgente de hacer pis. ? Necesidad de hacer pis con frecuencia. ? Hacer poca cantidad de pis con mucha frecuencia. ? Sangre en el pis. ? Hacer pis con mal olor u olor atpico.  Infrmele al mdico si ha tenido trabajo de parto prematuro anteriormente. Comunquese con un mdico si:  Piensa que est teniendo un trabajo de parto prematuro.  Tiene sntomas de trabajo de parto prematuro.  Tiene sntomas de infeccin. Solicite ayuda de inmediato si:  Tiene contracciones dolorosas cada 5 minutos o menos.  Rompe la bolsa. Resumen  Se llama trabajo de parto prematuro cuando se inicia antes de las 37 semanas de embarazo.  El beb puede tener problemas si nace antes de tiempo.  La causa del trabajo de parto prematuro no se conoce. Tener problemas en el tero, una infeccin o sangrado durante el embarazo aumenta el riesgo.  Comunquese con un mdico si tiene signos o sntomas de trabajo de parto prematuro. Esta informacin no tiene como fin reemplazar el consejo del mdico. Asegrese de hacerle al mdico cualquier pregunta que tenga. Document Revised: 06/17/2019 Document Reviewed: 06/17/2019 Elsevier Patient Education  2021 Elsevier Inc.  

## 2020-05-26 ENCOUNTER — Inpatient Hospital Stay (HOSPITAL_COMMUNITY)
Admission: AD | Admit: 2020-05-26 | Discharge: 2020-05-26 | Disposition: A | Discharge status: Home / Self Care | Payer: Medicaid Other | Attending: Obstetrics & Gynecology | Admitting: Obstetrics & Gynecology

## 2020-05-26 ENCOUNTER — Telehealth: Payer: Self-pay | Admitting: Radiology

## 2020-05-26 ENCOUNTER — Encounter (HOSPITAL_COMMUNITY): Payer: Self-pay | Admitting: Obstetrics & Gynecology

## 2020-05-26 DIAGNOSIS — Z3A36 36 weeks gestation of pregnancy: Secondary | ICD-10-CM | POA: Diagnosis not present

## 2020-05-26 DIAGNOSIS — O99891 Other specified diseases and conditions complicating pregnancy: Secondary | ICD-10-CM

## 2020-05-26 DIAGNOSIS — N760 Acute vaginitis: Secondary | ICD-10-CM

## 2020-05-26 DIAGNOSIS — Z3689 Encounter for other specified antenatal screening: Secondary | ICD-10-CM | POA: Diagnosis not present

## 2020-05-26 NOTE — Discharge Instructions (Signed)

## 2020-05-26 NOTE — MAU Note (Signed)
Patient reports mucousy brown discharge since Sunday.  Today started having ctx that come every 2-3 hours and last 2-4 mins.  Denies LOF/VB.  + FM.  Denies complications w/ her pregnancy.

## 2020-05-26 NOTE — MAU Provider Note (Signed)
History   [id number]   Chief Complaint  Patient presents with  . Vaginal Discharge  . Contractions    HPI Tracie [name] is a 40 y.o. female  G2P1001 @36 .1 wks here with report of ctx and brown discharge. Reports seeing a brown discharge when she wiped today. No VB. No recent sex. Reports feeling ctx every 2-3 hours. She reports + fetal movement. All other systems negative.    Patient's last menstrual period was 09/16/2019.  OB History  Gravida Para Term Preterm AB Living  2 1 1     1   SAB IAB Ectopic Multiple Live Births  0 0     1    # Outcome Date GA Lbr Len/2nd Weight Sex Delivery Anes PTL Lv  2 Current           1 Term      Vag-Spont   LIV    Past Medical History:  Diagnosis Date  . Anxiety   . Depression   . Headache     Family History  Problem Relation Age of Onset  . Hypertension Neg Hx     Social History   Socioeconomic History  . Marital status: Single    Spouse name: Not on file  . Number of children: Not on file  . Years of education: Not on file  . Highest education level: Not on file  Occupational History  . Not on file  Tobacco Use  . Smoking status: Never Smoker  . Smokeless tobacco: Never Used  Vaping Use  . Vaping Use: Never used  Substance and Sexual Activity  . Alcohol use: No  . Drug use: No  . Sexual activity: Yes    Birth control/protection: Condom  Other Topics Concern  . Not on file  Social History Narrative  . Not on file   Social Determinants of Health   Financial Resource Strain: Not on file  Food Insecurity: Not on file  Transportation Needs: Not on file  Physical Activity: Not on file  Stress: Not on file  Social Connections: Not on file    No Known Allergies  No current facility-administered medications on file prior to encounter.   Current Outpatient Medications on File Prior to Encounter  Medication Sig Dispense Refill  . acetaminophen (TYLENOL) 325 MG tablet Take 2 tablets (650 mg total) by  mouth every 4 (four) hours as needed. 100 tablet 2  . aspirin EC 81 MG tablet Take 1 tablet (81 mg total) by mouth daily. Take after 12 weeks for prevention of preeclampsia later in pregnancy 300 tablet 2  . omeprazole (PRILOSEC) 20 MG capsule Take 1 capsule (20 mg total) by mouth daily. 30 capsule 0  . Prenatal Vit-Fe Fumarate-FA (MULTIVITAMIN-PRENATAL) 27-0.8 MG TABS tablet Take 1 tablet by mouth daily at 12 noon.    . promethazine (PHENERGAN) 25 MG tablet Take 1 tablet (25 mg total) by mouth every 6 (six) hours as needed for nausea or vomiting. (Patient not taking: Reported on 04/16/2020) 30 tablet 2  . sertraline (ZOLOFT) 50 MG tablet Take 1 tablet (50 mg total) by mouth daily. 30 tablet 3  . Vitamin D, Ergocalciferol, (DRISDOL) 1.25 MG (50000 UNIT) CAPS capsule Take 1 capsule (50,000 Units total) by mouth every 7 (seven) days. 4 capsule 1     Review of Systems  Gastrointestinal: Positive for abdominal pain.  Genitourinary: Positive for vaginal discharge. Negative for vaginal bleeding.   Physical Exam   Vitals:   05/26/20 1728 05/26/20 1733  BP:  121/69  Pulse:  97  Resp:  20  Temp:  98.5 F (36.9 C)  Weight: 85.4 kg     Physical Exam Vitals and nursing note reviewed. Exam conducted with a chaperone present.  Constitutional:      General: She is not in acute distress.    Appearance: Normal appearance.  HENT:     Head: Normocephalic and atraumatic.  Cardiovascular:     Rate and Rhythm: Normal rate.  Pulmonary:     Effort: Pulmonary effort is normal. No respiratory distress.  Abdominal:     Palpations: Abdomen is soft.     Tenderness: There is no abdominal tenderness.     Comments: gravid  Genitourinary:    Comments: External: no lesions or erythema Vagina: rugated, pink, moist, scant yellow mucous discharge Cervix 1/50 per RN  Musculoskeletal:        General: Normal range of motion.     Cervical back: Normal range of motion.  Skin:    General: Skin is warm and dry.   Neurological:     General: No focal deficit present.     Mental Status: She is alert and oriented to person, place, and time.  Psychiatric:        Mood and Affect: Mood normal.        Behavior: Behavior normal.   EFM: 145 bpm, mod variability, + accels, no decels Toco: rare  MAU Course  Procedures  MDM No sings of VB or PTL. Stable for discharge home.   Assessment and Plan   1. [redacted] weeks gestation of pregnancy   2. NST (non-stress test) reactive   3. Vaginitis during pregnancy in third trimester    Discharge home Follow up at [facility] as scheduled PTL precautions Bleeding precautions  Allergies as of 05/26/2020   No Known Allergies     Medication List    STOP taking these medications   promethazine 25 MG tablet Commonly known as: PHENERGAN   sertraline 50 MG tablet Commonly known as: ZOLOFT     TAKE these medications   acetaminophen 325 MG tablet Commonly known as: Tylenol Take 2 tablets (650 mg total) by mouth every 4 (four) hours as needed.   aspirin EC 81 MG tablet Take 1 tablet (81 mg total) by mouth daily. Take after 12 weeks for prevention of preeclampsia later in pregnancy   multivitamin-prenatal 27-0.8 MG Tabs tablet Take 1 tablet by mouth daily at 12 noon.   omeprazole 20 MG capsule Commonly known as: PRILOSEC Take 1 capsule (20 mg total) by mouth daily.   Vitamin D (Ergocalciferol) 1.25 MG (50000 UNIT) Caps capsule Commonly known as: DRISDOL Take 1 capsule (50,000 Units total) by mouth every 7 (seven) days.      Live interpreter present for all interactions  Donette Larry, [place] 05/26/2020 6:59 PM

## 2020-05-26 NOTE — Telephone Encounter (Signed)
Spoke with patient via interpreter, patient has been experiencing a thick brown discharge for several days. Woke this morning having contracts but they are "not consistent". Spoke with Dr Vergie Living who stated because of the brown discharge and it being 4:00 (office closes at 5:00) to send patient to MAU. Instructions given to patient who expresses understanding and said she would go to be evaluated at [facility].

## 2020-05-28 ENCOUNTER — Ambulatory Visit (INDEPENDENT_AMBULATORY_CARE_PROVIDER_SITE_OTHER): Payer: Self-pay | Admitting: Family Medicine

## 2020-05-28 ENCOUNTER — Other Ambulatory Visit (HOSPITAL_COMMUNITY)
Admission: RE | Admit: 2020-05-28 | Discharge: 2020-05-28 | Disposition: A | Discharge status: Home / Self Care | Payer: Self-pay | Source: Ambulatory Visit | Attending: Family Medicine | Admitting: Family Medicine

## 2020-05-28 ENCOUNTER — Other Ambulatory Visit: Payer: Self-pay | Admitting: Family Medicine

## 2020-05-28 ENCOUNTER — Other Ambulatory Visit: Payer: Self-pay

## 2020-05-28 VITALS — BP 112/75 | HR 98 | Wt 187.0 lb

## 2020-05-28 DIAGNOSIS — Z789 Other specified health status: Secondary | ICD-10-CM

## 2020-05-28 DIAGNOSIS — O0990 Supervision of high risk pregnancy, unspecified, unspecified trimester: Secondary | ICD-10-CM

## 2020-05-28 LAB — OB RESULTS CONSOLE GC/CHLAMYDIA: Gonorrhea: NEGATIVE

## 2020-05-28 NOTE — Progress Notes (Signed)
   PRENATAL VISIT NOTE  Subjective:  Tracie [name] is a 40 y.o. G2P1001 at [redacted]w[redacted]d being seen today for ongoing prenatal care.  She is currently monitored for the following issues for this low-risk pregnancy and has Bilateral chronic knee pain; Anxiety and depression; Generalized anxiety disorder; Supervision of high risk pregnancy, antepartum; Multigravida of advanced maternal age in third trimester; Language barrier; Biological false positive RPR test; Sciatic pain, right; and Vaginal bleeding in pregnancy, third trimester on their problem list.  Patient reports no complaints.  Contractions: Irregular. Vag. Bleeding: None.  Movement: Present. Denies leaking of fluid.   The following portions of the patient's history were reviewed and updated as appropriate: allergies, current medications, past family history, past medical history, past social history, past surgical history and problem list.   Objective:   Vitals:   05/28/20 1408  BP: 112/75  Pulse: 98  Weight: 187 lb (84.8 kg)    Fetal Status: Fetal Heart Rate (bpm): 146   Movement: Present     General:  Alert, oriented and cooperative. Patient is in no acute distress.  Skin: Skin is warm and dry. No rash noted.   Cardiovascular: Normal heart rate noted  Respiratory: Normal respiratory effort, no problems with respiration noted  Abdomen: Soft, gravid, appropriate for gestational age.  Pain/Pressure: Present     Pelvic: Cervical exam performed in the presence of a chaperone        Extremities: Normal range of motion.  Edema: Mild pitting, slight indentation  Mental Status: Normal mood and affect. Normal behavior. Normal judgment and thought content.   Assessment and Plan:  Pregnancy: G2P1001 at [redacted]w[redacted]d 1. Supervision of high risk pregnancy, antepartum Cultures  today - Strep Gp B NAA - GC/Chlamydia probe amp ([place])not at [facility]  2. Language barrier Spanish interpreter: Video used   Preterm labor symptoms and general  obstetric precautions including but not limited to vaginal bleeding, contractions, leaking of fluid and fetal movement were reviewed in detail with the patient. Please refer to After Visit Summary for other counseling recommendations.   Return in 1 week (on 06/04/2020).  Future Appointments  Date Time Provider Department Center  06/04/2020 10:30 AM Anyanwu, Jethro Bastos, MD [facility]    Reva Bores, MD

## 2020-05-28 NOTE — Patient Instructions (Signed)

## 2020-05-29 LAB — GC/CHLAMYDIA PROBE AMP (~~LOC~~) NOT AT ARMC
Chlamydia: NEGATIVE
Comment: NEGATIVE
Comment: NORMAL
Neisseria Gonorrhea: NEGATIVE

## 2020-05-30 LAB — STREP GP B NAA: Strep Gp B NAA: NEGATIVE

## 2020-06-04 ENCOUNTER — Ambulatory Visit (INDEPENDENT_AMBULATORY_CARE_PROVIDER_SITE_OTHER): Payer: Self-pay | Admitting: Obstetrics & Gynecology

## 2020-06-04 ENCOUNTER — Other Ambulatory Visit: Payer: Self-pay

## 2020-06-04 ENCOUNTER — Encounter: Payer: Self-pay | Admitting: Obstetrics & Gynecology

## 2020-06-04 VITALS — BP 118/78 | HR 89 | Wt 185.0 lb

## 2020-06-04 DIAGNOSIS — Z3A37 37 weeks gestation of pregnancy: Secondary | ICD-10-CM

## 2020-06-04 DIAGNOSIS — O09523 Supervision of elderly multigravida, third trimester: Secondary | ICD-10-CM

## 2020-06-04 DIAGNOSIS — O0990 Supervision of high risk pregnancy, unspecified, unspecified trimester: Secondary | ICD-10-CM

## 2020-06-04 NOTE — Progress Notes (Signed)
   PRENATAL VISIT NOTE  Subjective:  Tracie [name] is a 40 y.o. G2P1001 at [redacted]w[redacted]d being seen today for ongoing prenatal care. Patient is Spanish-speaking only, interpreter present for this encounter. She is currently monitored for the following issues for this high-risk pregnancy and has Bilateral chronic knee pain; Anxiety and depression; Generalized anxiety disorder; Supervision of high risk pregnancy, antepartum; Multigravida of advanced maternal age in third trimester; Language barrier; Biological false positive RPR test; Sciatic pain, right; and Vaginal bleeding in pregnancy, third trimester on their problem list.  Patient reports no complaints.  Contractions: Regular. Vag. Bleeding: None.  Movement: Present. Denies leaking of fluid.   The following portions of the patient's history were reviewed and updated as appropriate: allergies, current medications, past family history, past medical history, past social history, past surgical history and problem list.   Objective:   Vitals:   06/04/20 1030  BP: 118/78  Pulse: 89  Weight: 185 lb (83.9 kg)    Fetal Status: Fetal Heart Rate (bpm): 142 Fundal Height: 37 cm Movement: Present  Presentation: Vertex  General:  Alert, oriented and cooperative. Patient is in no acute distress.  Skin: Skin is warm and dry. No rash noted.   Cardiovascular: Normal heart rate noted  Respiratory: Normal respiratory effort, no problems with respiration noted  Abdomen: Soft, gravid, appropriate for gestational age.  Pain/Pressure: Present     Pelvic: Cervical exam performed in the presence of a chaperone Dilation: Closed Effacement (%): 50 Station: Ballotable  Extremities: Normal range of motion.  Edema: Trace  Mental Status: Normal mood and affect. Normal behavior. Normal judgment and thought content.   Assessment and Plan:  Pregnancy: G2P1001 at [redacted]w[redacted]d 1. [redacted] weeks gestation of pregnancy 2. Multigravida of advanced maternal age in third trimester 3.  Supervision of high risk pregnancy, antepartum Negative pelvic cultures last week. Discussed with patient. Term labor symptoms and general obstetric precautions including but not limited to vaginal bleeding, contractions, leaking of fluid and fetal movement were reviewed in detail with the patient. Please refer to After Visit Summary for other counseling recommendations.   Return in about 1 week (around 06/11/2020) for OFFICE OB VISIT (MD only).  No future appointments.  Jaynie Collins, MD

## 2020-06-04 NOTE — Patient Instructions (Signed)
Signos y sntomas del trabajo de parto Signs and Symptoms of Labor El [place] de [place] es el proceso natural del cuerpo para sacar al beb y a la placenta del tero. Por lo general, el proceso del trabajo de parto comienza cuando el embarazo ha llegado a su trmino, entre las semanas 33 y 34 del Media planner. Signos y sntomas de que est cerca de empezar el [place] de parto A medida que el cuerpo se prepara para el [place] de parto y el nacimiento del beb, puede notar los siguientes sntomas en las semanas y [place] anteriores al trabajo de parto propiamente dicho:  Eliminacin de una pequea cantidad de mucosidad espesa y sanguinolenta por la vagina. A esto se lo llama aparicin normal de sangre o prdida del tapn mucoso. Esto puede suceder ms de una semana antes de que comience el [place] de [place], o justo antes de que comience el [place] de parto a medida que el cuello uterino comienza a ensancharse (dilatarse). En algunas mujeres, el tapn [employer] entero de una sola vez. En otras, pueden salir partes del tapn mucoso de forma gradual [employer].  El beb se mueve (desciende) a la parte inferior de la pelvis para ponerse en posicin para el nacimiento (aligeramiento). Cuando esto sucede, puede sentir ms presin en la vejiga y el hueso plvico, y menos presin en las costillas. Esto facilitar la respiracin. Tambin puede hacer que necesite orinar con ms frecuencia y que tenga problemas para Public house manager.  Tener "contracciones de prctica", tambin llamadas contracciones de [place], o [place] de [place]. Estas se producen a intervalos irregulares (espaciadas de modo desigual) con una diferencia de ms de 10 minutos. Las contracciones de [place] de parto falso son frecuentes despus del ejercicio o la actividad sexual. Se detendrn si cambia de posicin, descansa o bebe lquidos. Estas contracciones son generalmente leves y no se tornan ms fuertes con el tiempo. Pueden  sentirse como lo siguiente: ? Un dolor de espalda. ? Calambres leves, similares a los [employer]. ? Tirantez o presin en el abdomen. Otros sntomas tempranos pueden ser los siguientes:  Nuseas o prdida del apetito.  Diarrea.  Una repentina explosin de energa o sentirse muy cansada.  Cambios en el humor.  Problemas para dormir.   Signos y sntomas de que ha comenzado el trabajo de parto [place] signos de que est en trabajo de parto pueden incluir los siguientes:  Contracciones a intervalos regulares (espaciadas de modo regular) que se incrementan en intensidad. Esto puede sentirse como presin o estrechamiento intenso en el abdomen, que se desplaza hacia la espalda. ? Las contracciones pueden sentirse tambin como dolor rtmico en la parte superior de los muslos y la espalda que va y viene a intervalos regulares. ? Para las [employer] primerizas, este cambio en la intensidad de las contracciones ocurre generalmente a un ritmo ms gradual. ? Las mujeres que ya han dado a luz pueden notar una progresin ms rpida de los cambios de las contracciones.  Sensacin de presin en el rea vaginal.  Ruptura de la bolsa (ruptura de las [place]). Es Cabin crew en que el saco de lquido que rodea al beb se rompe. La prdida de lquido de la vagina puede ser transparente o estar teida de sangre. Generalmente el trabajo de parto comienza 24 horas despus de la ruptura de [place], [place] puede tomar ms Oceanographer. ? Algunas mujeres pueden sentir una prdida repentina de lquido. ? Otras notan que la ropa interior se moja  repetidas veces. Siga estas instrucciones en su casa:  Cuando comience el trabajo de parto o si rompe bolsa, llame al mdico o a la lnea de atencin de enfermera. Ellos determinarn, en funcin de su situacin, cundo debe ir a hacerse un examen.  Durante el trabajo de parto temprano, es posible que pueda descansar y manejar los sntomas en su casa. Algunas  estrategias para probar en su casa incluyen: ? Tcnicas de respiracin y relajacin. ? Tomar una ducha o un bao de inmersin tibios. ? Escuchar msica. ? Usar una almohadilla trmica en la espalda para aliviar el dolor. Si se le indica que use calor:  Coloque una toalla entre la piel y la fuente de calor.  Aplique calor durante 20 a 30minutos.  Retire la fuente de calor si la piel se pone de color rojo brillante. Esto es especialmente importante si no puede sentir dolor, calor o fro. Puede correr un mayor riesgo de sufrir quemaduras.   Comunquese con un mdico si:  Comenz el trabajo de parto.  Rompe la bolsa. Solicite ayuda de inmediato si:  Tiene contracciones dolorosas y regulares cada 5 minutos o menos.  El trabajo de parto comienza antes de que se cumplan las 37 semanas de embarazo.  Tiene fiebre.  Elimina cogulos de sangre de color rojo brillante por la vagina.  No siente que el beb se mueva.  Tiene dolor de cabeza intenso con o sin problemas de visin.  Tiene nuseas intensas, vmitos o diarrea.  Siente falta de aire o dolor en el pecho. Estos sntomas pueden representar un problema grave que constituye una emergencia. No espere a ver si los sntomas desaparecen. Solicite atencin mdica de inmediato. Comunquese con el servicio de emergencias de su localidad (911 en los [place]). No conduzca por sus propios medios hasta el hospital. Resumen  El trabajo de parto es el proceso natural del cuerpo por el cual se saca al beb y la placenta del tero.  Por lo general, el proceso del trabajo de parto comienza cuando el embarazo ha llegado a su trmino, entre las semanas 37 y 40 de embarazo.  Cuando comience el trabajo de parto o si rompe bolsa, llame al mdico o a la lnea de atencin de enfermera. Ellos determinarn, en funcin de su situacin, cundo debe ir a hacerse un examen. Esta informacin no tiene como fin reemplazar el consejo del mdico. Asegrese de  hacerle al mdico cualquier pregunta que tenga. Document Revised: 01/17/2020 Document Reviewed: 01/17/2020 Elsevier Patient Education  2021 Elsevier Inc.  

## 2020-06-10 ENCOUNTER — Telehealth: Payer: Self-pay | Admitting: *Deleted

## 2020-06-10 NOTE — Telephone Encounter (Signed)
Pt called via interpreter services, stating her stomach has been hard with a lot of pressure for the past 2 days. Denies any vaginal bleeding, LOF. States it hard to tell if baby is moving due to how hard her stomach is. Advised since we are unable to see her in the office now and not sure she should wait till her OB tomorrow advised pt to  go to MAU to be checked out. Pt states she will let her kids finish eating and then she will go to hospital.

## 2020-06-11 ENCOUNTER — Other Ambulatory Visit: Payer: Self-pay

## 2020-06-11 ENCOUNTER — Ambulatory Visit (INDEPENDENT_AMBULATORY_CARE_PROVIDER_SITE_OTHER): Payer: Self-pay | Admitting: Family Medicine

## 2020-06-11 VITALS — BP 134/83 | HR 102 | Wt 187.0 lb

## 2020-06-11 DIAGNOSIS — O09523 Supervision of elderly multigravida, third trimester: Secondary | ICD-10-CM

## 2020-06-11 DIAGNOSIS — Z789 Other specified health status: Secondary | ICD-10-CM

## 2020-06-11 DIAGNOSIS — O0990 Supervision of high risk pregnancy, unspecified, unspecified trimester: Secondary | ICD-10-CM

## 2020-06-11 DIAGNOSIS — Z758 Other problems related to medical facilities and other health care: Secondary | ICD-10-CM

## 2020-06-11 NOTE — Patient Instructions (Signed)
Lactancia materna  Breastfeeding    Decidir amamantar es una de las mejores elecciones que puede hacer por usted y su bebé. Un cambio en las hormonas durante el embarazo hace que las mamas produzcan leche materna en las glándulas productoras de leche. Las hormonas impiden que la leche materna sea liberada antes del nacimiento del bebé. Además, impulsan el flujo de leche luego del nacimiento. Una vez que ha comenzado a amamantar, pensar en el bebé, así como la succión o el llanto, pueden estimular la liberación de leche de las glándulas productoras de leche.  Los beneficios de amamantar  Las investigaciones demuestran que la lactancia materna ofrece muchos beneficios de salud para bebés y madres. Además, ofrece una forma gratuita y conveniente de alimentar al bebé.  Para el bebé  · La primera leche (calostro) ayuda a mejorar el funcionamiento del aparato digestivo del bebé.  · Las células especiales de la leche (anticuerpos) ayudan a combatir las infecciones en el bebé.  · Los bebés que se alimentan con leche materna también tienen menos probabilidades de tener asma, alergias, obesidad o diabetes de tipo 2. Además, tienen menor riesgo de sufrir el síndrome de muerte súbita del lactante (SMSL).  · Los nutrientes de la leche materna son mejores para satisfacer las necesidades del bebé en comparación con la leche maternizada.  · La leche materna mejora el desarrollo cerebral del bebé.  Para usted  · La lactancia materna favorece el desarrollo de un vínculo muy especial entre la madre y el bebé.  · Es conveniente. La leche materna es económica y siempre está disponible a la temperatura correcta.  · La lactancia materna ayuda a quemar calorías. Le ayuda a perder el peso ganado durante el embarazo.  · Hace que el útero vuelva al tamaño que tenía antes del embarazo más rápido. Además, disminuye el sangrado (loquios) después del parto.  · La lactancia materna contribuye a reducir el riesgo de tener diabetes de tipo 2,  osteoporosis, artritis reumatoide, enfermedades cardiovasculares y cáncer de mama, ovario, útero y endometrio en el futuro.  Información básica sobre la lactancia  Comienzo de la lactancia  · Encuentre un lugar cómodo para sentarse o acostarse, con un buen respaldo para el cuello y la espalda.  · Coloque una almohada o una manta enrollada debajo del bebé para acomodarlo a la altura de la mama (si está sentada). Las almohadas para amamantar se han diseñado especialmente a fin de servir de apoyo para los brazos y el bebé mientras amamanta.  · Asegúrese de que la barriga del bebé (abdomen) esté frente a la suya.  · Masajee suavemente la mama. Con las yemas de los dedos, masajee los bordes exteriores de la mama hacia adentro, en dirección al pezón. Esto estimula el flujo de leche. Si la leche fluye lentamente, es posible que deba continuar con este movimiento durante la lactancia.  · Sostenga la mama con 4 dedos por debajo y el pulgar por arriba del pezón (forme la letra “C” con la mano). Asegúrese de que los dedos se encuentren lejos del pezón y de la boca del bebé.  · Empuje suavemente los labios del bebé con el pezón o con el dedo.  · Cuando la boca del bebé se abra lo suficiente, acérquelo rápidamente a la mama e introduzca todo el pezón y la aréola, tanto como sea posible, dentro de la boca del bebé. La aréola es la zona de color que rodea al pezón.  ? Debe haber más aréola visible por arriba   del labio superior del bebé que por debajo del labio inferior.  ? Los labios del bebé deben estar abiertos y extendidos hacia afuera (evertidos) para asegurar que el bebé se prenda de forma adecuada y cómoda.  ? La lengua del bebé debe estar entre la encía inferior y la mama.  · Asegúrese de que la boca del bebé esté en la posición correcta alrededor del pezón (prendido). Los labios del bebé deben crear un sello sobre la mama y estar doblados hacia afuera (invertidos).  · Es común que el bebé succione durante 2 a 3 minutos  para que comience el flujo de leche materna.  Cómo debe prenderse  Es muy importante que le enseñe al bebé cómo prenderse adecuadamente a la mama. Si el bebé no se prende adecuadamente, puede causar dolor en los pezones, reducir la producción de leche materna y hacer que el bebé tenga un escaso aumento de peso. Además, si el bebé no se prende adecuadamente al pezón, puede tragar aire durante la alimentación. Esto puede causarle molestias al bebé. Hacer eructar al bebé al cambiar de mama puede ayudarlo a liberar el aire. Sin embargo, enseñarle al bebé cómo prenderse a la mama adecuadamente es la mejor manera de evitar que se sienta molesto por tragar aire mientras se alimenta.  Signos de que el bebé se ha prendido adecuadamente al pezón  · Tironea o succiona de modo silencioso, sin causarle dolor. Los labios del bebé deben estar extendidos hacia afuera (evertidos).  · Se escucha que traga cada 3 o 4 succiones una vez que la leche ha comenzado a fluir (después de que se produzca el reflejo de eyección de la leche).  · Hay movimientos musculares por arriba y por delante de sus oídos al succionar.  Signos de que el bebé no se ha prendido adecuadamente al pezón  · Hace ruidos de succión o de chasquido mientras se alimenta.  · Siente dolor en los pezones.  Si cree que el bebé no se prendió correctamente, deslice el dedo en la comisura de la boca y colóquelo entre las encías del bebé para interrumpir la succión. Intente volver a comenzar a amamantar.  Signos de lactancia materna exitosa  Signos del bebé  · El bebé disminuirá gradualmente el número de succiones o dejará de succionar por completo.  · El bebé se quedará dormido.  · El cuerpo del bebé se relajará.  · El bebé retendrá una pequeña cantidad de leche en la boca.  · El bebé se desprenderá solo del pecho.  Signos que presenta usted  · Las mamas han aumentado la firmeza, el peso y el tamaño 1 a 3 horas después de amamantar.  · Están más blandas inmediatamente después  de amamantar.  · Se producen un aumento del volumen de leche y un cambio en su consistencia y color hacia el quinto día de lactancia.  · Los pezones no duelen, no están agrietados ni sangran.  Signos de que su bebé recibe la cantidad de leche suficiente  · Mojar por lo menos 1 o 2 pañales durante las primeras 24 horas después del nacimiento.  · Mojar por lo menos 5 o 6 pañales cada 24 horas durante la primera semana después del nacimiento. La orina debe ser clara o de color amarillo pálido a los 5 días de vida.  · Mojar entre 6 y 8 pañales cada 24 horas a medida que el bebé sigue creciendo y desarrollándose.  · Defeca por lo menos 3 veces en 24 horas a los 5 días de vida. Las heces   deben ser blandas y amarillentas.  · Defeca por lo menos 3 veces en 24 horas a los 7 días de vida. Las heces deben ser grumosas y amarillentas.  · No registra una pérdida de peso mayor al 10 % del peso al nacer durante los primeros 3 días de vida.  · Aumenta de peso un promedio de 4 a 7 onzas (113 a 198 g) por semana después de los 4 días de vida.  · Aumenta de peso, diariamente, de manera uniforme a partir de los 5 días de vida, sin registrar pérdida de peso después de las 2 semanas de vida.  Después de alimentarse, es posible que el bebé regurgite una pequeña cantidad de leche. Esto es normal.  Frecuencia y duración de la lactancia  El amamantamiento frecuente la ayudará a producir más leche y puede prevenir dolores en los pezones y las mamas extremadamente llenas (congestión mamaria). Alimente al bebé cuando muestre signos de hambre o si siente la necesidad de reducir la congestión de las mamas. Esto se denomina "lactancia a demanda". Las señales de que el bebé tiene hambre incluyen las siguientes:  · Aumento del estado de alerta, actividad o inquietud.  · Mueve la cabeza de un lado a otro.  · Abre la boca cuando se le toca la mejilla o la comisura de la boca (reflejo de búsqueda).  · Aumenta las vocalizaciones, tales como sonidos de  succión, se relame los labios, emite arrullos, suspiros o chirridos.  · Mueve la mano hacia la boca y se chupa los dedos o las manos.  · Está molesto o llora.  Evite el uso del chupete en las primeras 4 a 6 semanas después del nacimiento del bebé. Después de este período, podrá usar un chupete. Las investigaciones demostraron que el uso del chupete durante el primer año de vida del bebé disminuye el riesgo de tener el síndrome de muerte súbita del lactante (SMSL).  Permita que el niño se alimente en cada mama todo lo que desee. Cuando el bebé se desprende o se queda dormido mientras se está alimentando de la primera mama, ofrézcale la segunda. Debido a que, con frecuencia, los recién nacidos están somnolientos las primeras semanas de vida, es posible que deba despertar al bebé para alimentarlo.  Los horarios de lactancia varían de un bebé a otro. Sin embargo, las siguientes reglas pueden servir como guía para ayudarla a garantizar que el bebé se alimenta adecuadamente:  · Se puede amamantar a los recién nacidos (bebés de 4 semanas o menos de vida) cada 1 a 3 horas.  · No deben transcurrir más de 3 horas durante el día o 5 horas durante la noche sin que se amamante a los recién nacidos.  · Debe amamantar al bebé un mínimo de 8 veces en un período de 24 horas.  Extracción de leche materna         La extracción y el almacenamiento de la leche materna le permiten asegurarse de que el bebé se alimente exclusivamente de su leche materna, aun en momentos en los que no puede amamantar. Esto tiene especial importancia si debe regresar al trabajo en el período en que aún está amamantando o si no puede estar presente en los momentos en que el bebé debe alimentarse. Su asesor en lactancia puede ayudarla a encontrar un método de extracción que funcione mejor para usted y orientarla sobre cuánto tiempo es seguro almacenar leche materna.  Cómo cuidar las mamas durante la lactancia  Los pezones pueden secarse, agrietarse y doler  durante   la lactancia. Las siguientes recomendaciones pueden ayudarla a mantener las mamas humectadas y sanas:  · Evite usar jabón en los pezones.  · Use un sostén de soporte diseñado especialmente para la lactancia materna. Evite usar sostenes con aro o sostenes muy ajustados (sostenes deportivos).  · Seque al aire sus pezones durante 3 a 4 minutos después de amamantar al bebé.  · Utilice solo apósitos de algodón en el sostén para absorber las pérdidas de leche. La pérdida de un poco de leche materna entre las tomas es normal.  · Utilice lanolina sobre los pezones luego de amamantar. La lanolina ayuda a mantener la humedad normal de la piel. La lanolina pura no es perjudicial (no es tóxica) para el bebé. Además, puede extraer manualmente algunas gotas de leche materna y masajear suavemente esa leche sobre los pezones para que la leche se seque al aire.  Durante las primeras semanas después del nacimiento, algunas mujeres experimentan congestión mamaria. La congestión mamaria puede hacer que sienta las mamas pesadas, calientes y sensibles al tacto. El pico de la congestión mamaria ocurre en el plazo de los 3 a 5 días después del parto. Las siguientes recomendaciones pueden ayudarla a aliviar la congestión mamaria:  · Vacíe por completo las mamas al amamantar o extraer leche. Puede aplicar calor húmedo en las mamas (en la ducha o con toallas húmedas para manos) antes de amamantar o extraer leche. Esto aumenta la circulación y ayuda a que la leche fluya. Si el bebé no vacía por completo las mamas cuando lo amamanta, extraiga la leche restante después de que haya finalizado.  · Aplique compresas de hielo sobre las mamas inmediatamente después de amamantar o extraer leche, a menos que le resulte demasiado incómodo. Haga lo siguiente:  ? Ponga el hielo en una bolsa plástica.  ? Coloque una toalla entre la piel y la bolsa de hielo.  ? Coloque el hielo durante 20 minutos, 2 o 3 veces por día.  · Asegúrese de que el bebé  esté prendido y se encuentre en la posición correcta mientras lo alimenta.  Si la congestión mamaria persiste luego de 48 horas o después de seguir estas recomendaciones, comuníquese con su médico o un asesor en lactancia.  Recomendaciones de salud general durante la lactancia  · Consuma 3 comidas y 3 colaciones saludables todos los días. Las madres bien alimentadas que amamantan necesitan entre 450 y 500 calorías adicionales por día. Puede cumplir con este requisito al aumentar la cantidad de una dieta equilibrada que realice.  · Beba suficiente agua para mantener la orina clara o de color amarillo pálido.  · Descanse con frecuencia, relájese y siga tomando sus vitaminas prenatales para prevenir la fatiga, el estrés y los niveles bajos de vitaminas y minerales en el cuerpo (deficiencias de nutrientes).  · No consuma ningún producto que contenga nicotina o tabaco, como cigarrillos y cigarrillos electrónicos. El bebé puede verse afectado por las sustancias químicas de los cigarrillos que pasan a la leche materna y por la exposición al humo ambiental del tabaco. Si necesita ayuda para dejar de fumar, consulte al médico.  · Evite el consumo de alcohol.  · No consuma drogas ilegales o marihuana.  · Antes de usar cualquier medicamento, hable con el médico. Estos incluyen medicamentos recetados y de venta libre, como también vitaminas y suplementos a base de hierbas. Algunos medicamentos, que pueden ser perjudiciales para el bebé, pueden pasar a través de la leche materna.  · Puede quedar embarazada durante la lactancia. Si se desea un método   anticonceptivo, consulte al médico sobre cuáles son las opciones seguras durante la lactancia.  Dónde encontrar más información:  Liga internacional [place]: www.llli.org.  Comuníquese con un médico si:  · Siente que quiere dejar de amamantar o se siente frustrada con la lactancia.  · Sus pezones están agrietados o sangran.  · Sus mamas están irritadas, sensibles o  calientes.  · Tiene los siguientes síntomas:  ? Dolor en las mamas o en los pezones.  ? Un área hinchada en cualquiera de las mamas.  ? Fiebre o escalofríos.  ? Náuseas o vómitos.  ? Drenaje de otro líquido distinto de la leche materna desde los pezones.  · Sus mamas no se llenan antes de amamantar al bebé para el quinto día después del parto.  · Se siente triste y deprimida.  · El bebé:  ? Está demasiado somnoliento como para comer bien.  ? Tiene problemas para dormir.  ? Tiene más de 1 semana de vida y moja menos de 6 pañales en un periodo de 24 horas.  ? No ha aumentado de peso a los 5 días de vida.  · El bebé defeca menos de 3 veces en 24 horas.  · La piel del bebé o las partes blancas de los ojos se vuelven amarillentas.  Solicite ayuda de inmediato si:  · El bebé está muy cansado (letargo) y no se quiere despertar para comer.  · Le sube la fiebre sin causa.  Resumen  · La lactancia materna ofrece muchos beneficios de salud para bebés y madres.  · Intente amamantar a su bebé cuando muestre signos tempranos de hambre.  · Haga cosquillas o empuje suavemente los labios del bebé con el dedo o el pezón para lograr que el bebé abra la boca. Acerque el bebé a la mama. Asegúrese de que la mayor parte de la aréola se encuentre dentro de la boca del bebé. Ofrézcale una mama y haga eructar al bebé antes de pasar a la otra.  · Hable con su médico o asesor en lactancia si tiene dudas o problemas con la lactancia.  Esta información no tiene como fin reemplazar el consejo del médico. Asegúrese de hacerle al médico cualquier pregunta que tenga.  Document Revised: 06/01/2017 Document Reviewed: 06/27/2016  Elsevier Patient Education © 2021 [employer]

## 2020-06-11 NOTE — Progress Notes (Signed)
   PRENATAL VISIT NOTE  Subjective:  Tracie [name] is a 40 y.o. G2P1001 at [redacted]w[redacted]d being seen today for ongoing prenatal care.  She is currently monitored for the following issues for this low-risk pregnancy and has Bilateral chronic knee pain; Anxiety and depression; Generalized anxiety disorder; Supervision of high risk pregnancy, antepartum; Multigravida of advanced maternal age in third trimester; Language barrier; Biological false positive RPR test; Sciatic pain, right; and Vaginal bleeding in pregnancy, third trimester on their problem list.  Patient reports backache and contractions since last few days q 3 hours.  Contractions: Irregular. Vag. Bleeding: None.  Movement: Present. Denies leaking of fluid.   The following portions of the patient's history were reviewed and updated as appropriate: allergies, current medications, past family history, past medical history, past social history, past surgical history and problem list.   Objective:   Vitals:   06/11/20 1109  BP: 134/83  Pulse: (!) 102  Weight: 187 lb (84.8 kg)    Fetal Status: Fetal Heart Rate (bpm): 156 Fundal Height: 33 cm Movement: Present  Presentation: Vertex  General:  Alert, oriented and cooperative. Patient is in no acute distress.  Skin: Skin is warm and dry. No rash noted.   Cardiovascular: Normal heart rate noted  Respiratory: Normal respiratory effort, no problems with respiration noted  Abdomen: Soft, gravid, appropriate for gestational age.  Pain/Pressure: Present     Pelvic: Cervical exam performed in the presence of a chaperone Dilation: 1 Effacement (%): 50 Station: Ballotable  Extremities: Normal range of motion.  Edema: Trace  Mental Status: Normal mood and affect. Normal behavior. Normal judgment and thought content.   Assessment and Plan:  Pregnancy: G2P1001 at [redacted]w[redacted]d 1. Supervision of high risk pregnancy, antepartum Continue routine prenatal care.   2. Multigravida of advanced maternal age  in third trimester Declined genetics  3. Language barrier Video Spanish interpreter:  used   Term labor symptoms and general obstetric precautions including but not limited to vaginal bleeding, contractions, leaking of fluid and fetal movement were reviewed in detail with the patient. Please refer to After Visit Summary for other counseling recommendations.   Return in 1 week (on 06/18/2020).  No future appointments.  Reva Bores, MD

## 2020-06-12 ENCOUNTER — Inpatient Hospital Stay (HOSPITAL_COMMUNITY)
Admission: AD | Admit: 2020-06-12 | Discharge: 2020-06-13 | Disposition: A | Discharge status: Home / Self Care | Payer: Medicaid Other | Attending: Family Medicine | Admitting: Family Medicine

## 2020-06-12 ENCOUNTER — Other Ambulatory Visit: Payer: Self-pay

## 2020-06-12 ENCOUNTER — Encounter (HOSPITAL_COMMUNITY): Payer: Self-pay | Admitting: Family Medicine

## 2020-06-12 DIAGNOSIS — Z3A38 38 weeks gestation of pregnancy: Secondary | ICD-10-CM

## 2020-06-12 DIAGNOSIS — O471 False labor at or after 37 completed weeks of gestation: Secondary | ICD-10-CM

## 2020-06-12 DIAGNOSIS — O0990 Supervision of high risk pregnancy, unspecified, unspecified trimester: Secondary | ICD-10-CM

## 2020-06-12 NOTE — MAU Provider Note (Signed)
[name] is a G2P1001 at [redacted]w[redacted]d seen in MAU for labor. RN labor check, not seen by provider.   SVE by RN Dilation: 1 Effacement (%): 40 Cervical Position: Posterior Station: -3 Presentation: Vertex Exam by:: Tracie [name]   NST: FHR: baseline 150 bpm / moderate variability / accels present / occasional variable decels TOCO: contractions irregular every 6-10 mins  Assessment:  SIUP at [redacted]w[redacted]d with reactive NST. False labor.  Plan: Discharge home Labor precautions and kick counts included in AVS Patient to follow-up with Tracie [name] on 06/17/2020 as scheduled  Patient may return to MAU as needed or when in labor   Derrel Nip, MD  06/12/2020 11:45 PM   I reviewed NST and agree with resident's documentation as noted above.  Sheila Oats, MD 06/12/2020 11:55 PM

## 2020-06-12 NOTE — Progress Notes (Deleted)
[name] is a G2P1001 at [redacted]w[redacted]d seen in MAU for labor. RN labor check, not seen by provider. SVE by RN Dilation: 1 Effacement (%): 40 Cervical Position: Posterior Station: -3 Presentation: Vertex Exam by:: Quintella Baton RNC   NST - FHR: 150 bpm / moderate variability / accels present / occasional variable decels / TOCO: irregular every 6-10 mins   Plan:  D/C home with labor precautions Keep scheduled appt with Luna Kitchens on 06/17/2020  Derrel Nip, MD  06/12/2020 11:45 PM

## 2020-06-12 NOTE — MAU Note (Signed)
Some bloody show today. Ctxs for 2-3 hours.

## 2020-06-12 NOTE — Progress Notes (Signed)
[employer] interpreter Elita Quick [phone] used for d/c instructions. Labor precautions given and pt voiced understanding. QUestions answered.

## 2020-06-12 NOTE — Discharge Instructions (Signed)
[employer]osen's Emergency Medicine: Concepts and Clinical Practice (9th ed., pp. 2296- 2312). Elsevier.">  Braxton Hicks Contractions Contractions of the uterus can occur throughout pregnancy, but they are not always a sign that you are in labor. You may have practice contractions called Braxton Hicks contractions. These false labor contractions are sometimes confused with true labor. What are Braxton Hicks contractions? Braxton Hicks contractions are tightening movements that occur in the muscles of the uterus before labor. Unlike true labor contractions, these contractions do not result in opening (dilation) and thinning of the cervix. Toward the end of pregnancy (32-34 weeks), Braxton Hicks contractions can happen more often and may become stronger. These contractions are sometimes difficult to tell apart from true labor because they can be very uncomfortable. You should not feel embarrassed if you go to the hospital with false labor. Sometimes, the only way to tell if you are in true labor is for your health care provider to look for changes in the cervix. The health care provider will do a physical exam and may monitor your contractions. If you are not in true labor, the exam should show that your cervix is not dilating and your water has not broken. If there are no other health problems associated with your pregnancy, it is completely safe for you to be sent home with false labor. You may continue to have Braxton Hicks contractions until you go into true labor. How to tell the difference between true labor and false labor True labor  Contractions last 30-70 seconds.  Contractions become very regular.  Discomfort is usually felt in the top of the uterus, and it spreads to the lower abdomen and low back.  Contractions do not go away with walking.  Contractions usually become more intense and increase in frequency.  The cervix dilates and gets thinner. False labor  Contractions are usually shorter  and not as strong as true labor contractions.  Contractions are usually irregular.  Contractions are often felt in the front of the lower abdomen and in the groin.  Contractions may go away when you walk around or change positions while lying down.  Contractions get weaker and are shorter-lasting as time goes on.  The cervix usually does not dilate or become thin. Follow these instructions at home:  Take over-the-counter and prescription medicines only as told by your health care provider.  Keep up with your usual exercises and follow other instructions from your health care provider.  Eat and drink lightly if you think you are going into labor.  If Braxton Hicks contractions are making you uncomfortable: ? Change your position from lying down or resting to walking, or change from walking to resting. ? Sit and rest in a tub of warm water. ? Drink enough fluid to keep your urine pale yellow. Dehydration may cause these contractions. ? Do slow and deep breathing several times an hour.  Keep all follow-up prenatal visits as told by your health care provider. This is important.   Contact a health care provider if:  You have a fever.  You have continuous pain in your abdomen. Get help right away if:  Your contractions become stronger, more regular, and closer together.  You have fluid leaking or gushing from your vagina.  You pass blood-tinged mucus (bloody show).  You have bleeding from your vagina.  You have low back pain that you never had before.  You feel your baby's head pushing down and causing pelvic pressure.  Your baby is not moving inside   you as much as it used to. Summary  Contractions that occur before labor are called Braxton Hicks contractions, false labor, or practice contractions.  Braxton Hicks contractions are usually shorter, weaker, farther apart, and less regular than true labor contractions. True labor contractions usually become progressively  stronger and regular, and they become more frequent.  Manage discomfort from Braxton Hicks contractions by changing position, resting in a warm bath, drinking plenty of water, or practicing deep breathing. This information is not intended to replace advice given to you by your health care provider. Make sure you discuss any questions you have with your health care provider. Document Revised: 02/17/2017 Document Reviewed: 07/21/2016 Elsevier Patient Education  2021 Elsevier Inc.   Fetal Movement Counts Patient Name: ________________________________________________ Patient Due Date: ____________________  What is a fetal movement count? A fetal movement count is the number of times that you feel your baby move during a certain amount of time. This may also be called a fetal kick count. A fetal movement count is recommended for every pregnant woman. You may be asked to start counting fetal movements as early as week 28 of your pregnancy. Pay attention to when your baby is most active. You may notice your baby's sleep and wake cycles. You may also notice things that make your baby move more. You should do a fetal movement count:  When your baby is normally most active.  At the same time each day. A good time to count movements is while you are resting, after having something to eat and drink. How do I count fetal movements? 1. Find a quiet, comfortable area. Sit, or lie down on your side. 2. Write down the date, the start time and stop time, and the number of movements that you felt between those two times. Take this information with you to your health care visits. 3. Write down your start time when you feel the first movement. 4. Count kicks, flutters, swishes, rolls, and jabs. You should feel at least 10 movements. 5. You may stop counting after you have felt 10 movements, or if you have been counting for 2 hours. Write down the stop time. 6. If you do not feel 10 movements in 2 hours, contact  your health care provider for further instructions. Your health care provider may want to do additional tests to assess your baby's well-being. Contact a health care provider if:  You feel fewer than 10 movements in 2 hours.  Your baby is not moving like he or she usually does. Date: ____________ Start time: ____________ Stop time: ____________ Movements: ____________ Date: ____________ Start time: ____________ Stop time: ____________ Movements: ____________ Date: ____________ Start time: ____________ Stop time: ____________ Movements: ____________ Date: ____________ Start time: ____________ Stop time: ____________ Movements: ____________ Date: ____________ Start time: ____________ Stop time: ____________ Movements: ____________ Date: ____________ Start time: ____________ Stop time: ____________ Movements: ____________ Date: ____________ Start time: ____________ Stop time: ____________ Movements: ____________ Date: ____________ Start time: ____________ Stop time: ____________ Movements: ____________ Date: ____________ Start time: ____________ Stop time: ____________ Movements: ____________ This information is not intended to replace advice given to you by your health care provider. Make sure you discuss any questions you have with your health care provider. Document Revised: 10/25/2018 Document Reviewed: 10/25/2018 Elsevier Patient Education  2021 Elsevier Inc.  

## 2020-06-12 NOTE — Progress Notes (Signed)
Dr Nobie Putnam notified of pt's admission and status. Aware of no cervical change after 2 checks, reactive FHR with occ variable, irreg ctxs with u/I. Pt stable for d/c home.

## 2020-06-14 ENCOUNTER — Other Ambulatory Visit: Payer: Self-pay

## 2020-06-14 ENCOUNTER — Encounter (HOSPITAL_COMMUNITY): Payer: Self-pay | Admitting: Obstetrics and Gynecology

## 2020-06-14 ENCOUNTER — Inpatient Hospital Stay (HOSPITAL_COMMUNITY): Payer: Medicaid Other | Admitting: Anesthesiology

## 2020-06-14 ENCOUNTER — Inpatient Hospital Stay (HOSPITAL_COMMUNITY)
Admission: AD | Admit: 2020-06-14 | Discharge: 2020-06-15 | DRG: 807 | Disposition: A | Discharge status: Home / Self Care | Payer: Medicaid Other | Attending: Obstetrics and Gynecology | Admitting: Obstetrics and Gynecology

## 2020-06-14 DIAGNOSIS — Z23 Encounter for immunization: Secondary | ICD-10-CM

## 2020-06-14 DIAGNOSIS — O26893 Other specified pregnancy related conditions, third trimester: Secondary | ICD-10-CM | POA: Diagnosis present

## 2020-06-14 DIAGNOSIS — Z3A38 38 weeks gestation of pregnancy: Secondary | ICD-10-CM

## 2020-06-14 DIAGNOSIS — O99893 Other specified diseases and conditions complicating puerperium: Secondary | ICD-10-CM | POA: Diagnosis not present

## 2020-06-14 DIAGNOSIS — R42 Dizziness and giddiness: Secondary | ICD-10-CM | POA: Diagnosis not present

## 2020-06-14 DIAGNOSIS — R200 Anesthesia of skin: Secondary | ICD-10-CM | POA: Diagnosis not present

## 2020-06-14 DIAGNOSIS — Z3493 Encounter for supervision of normal pregnancy, unspecified, third trimester: Secondary | ICD-10-CM

## 2020-06-14 DIAGNOSIS — F32A Depression, unspecified: Secondary | ICD-10-CM | POA: Diagnosis present

## 2020-06-14 DIAGNOSIS — G8918 Other acute postprocedural pain: Secondary | ICD-10-CM | POA: Diagnosis not present

## 2020-06-14 DIAGNOSIS — F419 Anxiety disorder, unspecified: Secondary | ICD-10-CM | POA: Diagnosis present

## 2020-06-14 DIAGNOSIS — R519 Headache, unspecified: Secondary | ICD-10-CM | POA: Diagnosis not present

## 2020-06-14 DIAGNOSIS — Z758 Other problems related to medical facilities and other health care: Secondary | ICD-10-CM | POA: Diagnosis present

## 2020-06-14 DIAGNOSIS — Z789 Other specified health status: Secondary | ICD-10-CM | POA: Diagnosis present

## 2020-06-14 DIAGNOSIS — Z20822 Contact with and (suspected) exposure to covid-19: Secondary | ICD-10-CM | POA: Diagnosis present

## 2020-06-14 DIAGNOSIS — O99355 Diseases of the nervous system complicating the puerperium: Secondary | ICD-10-CM | POA: Diagnosis not present

## 2020-06-14 LAB — CBC
HCT: 36.2 % (ref 36.0–46.0)
HCT: 35 % — ABNORMAL LOW (ref 36.0–46.0)
Hemoglobin: 11.9 g/dL — ABNORMAL LOW (ref 12.0–15.0)
Hemoglobin: 11.4 g/dL — ABNORMAL LOW (ref 12.0–15.0)
MCH: 28.7 pg (ref 26.0–34.0)
MCH: 28.8 pg (ref 26.0–34.0)
MCHC: 32.9 g/dL (ref 30.0–36.0)
MCHC: 32.6 g/dL (ref 30.0–36.0)
MCV: 87.2 fL (ref 80.0–100.0)
MCV: 88.4 fL (ref 80.0–100.0)
Platelets: 240 10*3/uL (ref 150–400)
Platelets: 220 10*3/uL (ref 150–400)
RBC: 4.15 MIL/uL (ref 3.87–5.11)
RBC: 3.96 MIL/uL (ref 3.87–5.11)
RDW: 14.2 % (ref 11.5–15.5)
RDW: 14.4 % (ref 11.5–15.5)
WBC: 11.2 10*3/uL — ABNORMAL HIGH (ref 4.0–10.5)
WBC: 18.2 10*3/uL — ABNORMAL HIGH (ref 4.0–10.5)
nRBC: 0 % (ref 0.0–0.2)
nRBC: 0 % (ref 0.0–0.2)

## 2020-06-14 LAB — RESP PANEL BY RT-PCR (FLU A&B, COVID) ARPGX2
Influenza A by PCR: NEGATIVE
Influenza B by PCR: NEGATIVE
SARS Coronavirus 2 by RT PCR: NEGATIVE

## 2020-06-14 LAB — COMPREHENSIVE METABOLIC PANEL
ALT: 13 U/L (ref 0–44)
AST: 21 U/L (ref 15–41)
Albumin: 2.7 g/dL — ABNORMAL LOW (ref 3.5–5.0)
Alkaline Phosphatase: 112 U/L (ref 38–126)
Anion gap: 10 (ref 5–15)
BUN: <5 mg/dL — ABNORMAL LOW (ref 6–20)
CO2: 16 mmol/L — ABNORMAL LOW (ref 22–32)
Calcium: 8.5 mg/dL — ABNORMAL LOW (ref 8.9–10.3)
Chloride: 109 mmol/L (ref 98–111)
Creatinine, Ser: 0.63 mg/dL (ref 0.44–1.00)
GFR, Estimated: >60 mL/min (ref >60)
Glucose, Bld: 126 mg/dL — ABNORMAL HIGH (ref 70–99)
Potassium: 3.5 mmol/L (ref 3.5–5.1)
Sodium: 135 mmol/L (ref 135–145)
Total Bilirubin: 0.4 mg/dL (ref 0.3–1.2)
Total Protein: 5.9 g/dL — ABNORMAL LOW (ref 6.5–8.1)

## 2020-06-14 LAB — TYPE AND SCREEN
ABO/RH(D): O POS
Antibody Screen: NEGATIVE

## 2020-06-14 LAB — POCT FERN TEST: POCT Fern Test: POSITIVE

## 2020-06-14 MED ORDER — ONDANSETRON HCL 4 MG PO TABS: 4.0000 mg | ORAL_TABLET | ORAL | Status: DC | PRN

## 2020-06-14 MED ORDER — BENZOCAINE-MENTHOL 20-0.5 % EX AERO: 1.0000 "application " | INHALATION_SPRAY | CUTANEOUS | Status: DC | PRN

## 2020-06-14 MED ORDER — TERBUTALINE SULFATE 1 MG/ML IJ SOLN
0.2500 mg | Freq: Once | INTRAMUSCULAR | Status: AC
  Administered 2020-06-14: 0.25 mg via SUBCUTANEOUS

## 2020-06-14 MED ORDER — OXYTOCIN-SODIUM CHLORIDE 30-0.9 UT/500ML-% IV SOLN
2.5000 [IU]/h | INTRAVENOUS | Status: DC
  Filled 2020-06-14: qty 500

## 2020-06-14 MED ORDER — DIPHENHYDRAMINE HCL 50 MG/ML IJ SOLN: 12.5000 mg | INTRAMUSCULAR | Status: DC | PRN

## 2020-06-14 MED ORDER — EPHEDRINE 5 MG/ML INJ: 10.0000 mg | INTRAVENOUS | Status: DC | PRN

## 2020-06-14 MED ORDER — ACETAMINOPHEN 325 MG PO TABS: 650.0000 mg | ORAL_TABLET | ORAL | Status: DC | PRN

## 2020-06-14 MED ORDER — OXYCODONE-ACETAMINOPHEN 5-325 MG PO TABS: 2.0000 | ORAL_TABLET | ORAL | Status: DC | PRN

## 2020-06-14 MED ORDER — FENTANYL-BUPIVACAINE-NACL 0.5-0.125-0.9 MG/250ML-% EP SOLN: 12.0000 mL/h | EPIDURAL | Status: DC | PRN

## 2020-06-14 MED ORDER — DIBUCAINE (PERIANAL) 1 % EX OINT: 1.0000 "application " | TOPICAL_OINTMENT | CUTANEOUS | Status: DC | PRN

## 2020-06-14 MED ORDER — METHYLERGONOVINE MALEATE 0.2 MG/ML IJ SOLN
INTRAMUSCULAR | Status: AC
  Administered 2020-06-14: 0.2 mg
  Filled 2020-06-14: qty 1

## 2020-06-14 MED ORDER — PHENYLEPHRINE 40 MCG/ML (10ML) SYRINGE FOR IV PUSH (FOR BLOOD PRESSURE SUPPORT)
80.0000 ug | PREFILLED_SYRINGE | INTRAVENOUS | Status: DC | PRN
  Administered 2020-06-14 (×2): 80 ug via INTRAVENOUS

## 2020-06-14 MED ORDER — LACTATED RINGERS IV SOLN
500.0000 mL | Freq: Once | INTRAVENOUS | Status: AC
  Administered 2020-06-14: 500 mL via INTRAVENOUS

## 2020-06-14 MED ORDER — OXYCODONE-ACETAMINOPHEN 5-325 MG PO TABS: 1.0000 | ORAL_TABLET | ORAL | Status: DC | PRN

## 2020-06-14 MED ORDER — ONDANSETRON HCL 4 MG/2ML IJ SOLN: 4.0000 mg | INTRAMUSCULAR | Status: DC | PRN

## 2020-06-14 MED ORDER — WITCH HAZEL-GLYCERIN EX PADS: 1.0000 "application " | MEDICATED_PAD | CUTANEOUS | Status: DC | PRN

## 2020-06-14 MED ORDER — OXYTOCIN BOLUS FROM INFUSION
333.0000 mL | Freq: Once | INTRAVENOUS | Status: AC
  Administered 2020-06-14: 333 mL via INTRAVENOUS

## 2020-06-14 MED ORDER — PRENATAL MULTIVITAMIN CH
1.0000 | ORAL_TABLET | Freq: Every day | ORAL | Status: DC
  Administered 2020-06-14 – 2020-06-15 (×2): 1 via ORAL
  Filled 2020-06-14 (×2): qty 1

## 2020-06-14 MED ORDER — TETANUS-DIPHTH-ACELL PERTUSSIS 5-2.5-18.5 LF-MCG/0.5 IM SUSY
0.5000 mL | PREFILLED_SYRINGE | Freq: Once | INTRAMUSCULAR | Status: AC
  Administered 2020-06-15: 0.5 mL via INTRAMUSCULAR
  Filled 2020-06-14: qty 0.5

## 2020-06-14 MED ORDER — LIDOCAINE HCL (PF) 1 % IJ SOLN: 30.0000 mL | INTRAMUSCULAR | Status: DC | PRN

## 2020-06-14 MED ORDER — FENTANYL-BUPIVACAINE-NACL 0.5-0.125-0.9 MG/250ML-% EP SOLN
12.0000 mL/h | EPIDURAL | Status: DC | PRN
  Administered 2020-06-14: 12 mL/h via EPIDURAL
  Filled 2020-06-14: qty 250

## 2020-06-14 MED ORDER — PHENYLEPHRINE 40 MCG/ML (10ML) SYRINGE FOR IV PUSH (FOR BLOOD PRESSURE SUPPORT): 80.0000 ug | PREFILLED_SYRINGE | INTRAVENOUS | Status: DC | PRN

## 2020-06-14 MED ORDER — INFLUENZA VAC SPLIT QUAD 0.5 ML IM SUSY
0.5000 mL | PREFILLED_SYRINGE | INTRAMUSCULAR | Status: AC
  Administered 2020-06-15: 0.5 mL via INTRAMUSCULAR
  Filled 2020-06-14: qty 0.5

## 2020-06-14 MED ORDER — IBUPROFEN 600 MG PO TABS
600.0000 mg | ORAL_TABLET | Freq: Four times a day (QID) | ORAL | Status: DC
  Administered 2020-06-14 – 2020-06-15 (×5): 600 mg via ORAL
  Filled 2020-06-14 (×5): qty 1

## 2020-06-14 MED ORDER — ONDANSETRON HCL 4 MG/2ML IJ SOLN: 4.0000 mg | Freq: Four times a day (QID) | INTRAMUSCULAR | Status: DC | PRN

## 2020-06-14 MED ORDER — MAGNESIUM HYDROXIDE 400 MG/5ML PO SUSP: 30.0000 mL | ORAL | Status: DC | PRN

## 2020-06-14 MED ORDER — LACTATED RINGERS IV SOLN: 500.0000 mL | INTRAVENOUS | Status: DC | PRN

## 2020-06-14 MED ORDER — COCONUT OIL OIL
1.0000 "application " | TOPICAL_OIL | Status: DC | PRN
  Administered 2020-06-15: 1 via TOPICAL

## 2020-06-14 MED ORDER — LACTATED RINGERS IV SOLN: 500.0000 mL | Freq: Once | INTRAVENOUS | Status: DC

## 2020-06-14 MED ORDER — LACTATED RINGERS IV SOLN: INTRAVENOUS | Status: DC

## 2020-06-14 MED ORDER — SOD CITRATE-CITRIC ACID 500-334 MG/5ML PO SOLN: 30.0000 mL | ORAL | Status: DC | PRN

## 2020-06-14 MED ORDER — LIDOCAINE-EPINEPHRINE (PF) 2 %-1:200000 IJ SOLN
INTRAMUSCULAR | Status: DC | PRN
  Administered 2020-06-14: 5 mL via EPIDURAL

## 2020-06-14 MED ORDER — LACTATED RINGERS IV BOLUS
500.0000 mL | Freq: Once | INTRAVENOUS | Status: AC
  Administered 2020-06-14: 500 mL via INTRAVENOUS

## 2020-06-14 MED ORDER — SIMETHICONE 80 MG PO CHEW: 80.0000 mg | CHEWABLE_TABLET | ORAL | Status: DC | PRN

## 2020-06-14 MED ORDER — PHENYLEPHRINE 40 MCG/ML (10ML) SYRINGE FOR IV PUSH (FOR BLOOD PRESSURE SUPPORT)
80.0000 ug | PREFILLED_SYRINGE | INTRAVENOUS | Status: DC | PRN
  Filled 2020-06-14: qty 10

## 2020-06-14 MED ORDER — MEASLES, MUMPS & RUBELLA VAC IJ SOLR: 0.5000 mL | Freq: Once | INTRAMUSCULAR | Status: DC

## 2020-06-14 MED ORDER — TERBUTALINE SULFATE 1 MG/ML IJ SOLN
INTRAMUSCULAR | Status: AC
  Filled 2020-06-14: qty 1

## 2020-06-14 MED ORDER — DIPHENHYDRAMINE HCL 25 MG PO CAPS: 25.0000 mg | ORAL_CAPSULE | Freq: Four times a day (QID) | ORAL | Status: DC | PRN

## 2020-06-14 NOTE — Progress Notes (Signed)
Tracie [name] is a 40 y.o. G2P1001 at [redacted]w[redacted]d.  Subjective: Very uncomfortable w/ UC's. Requesting pain meds or epidural.   Objective: BP 135/61   Pulse (!) 115   Temp 98.7 F (37.1 C) (Oral)   Resp 20   Ht 5\' 3"  (1.6 m)   Wt 86 kg   LMP 09/16/2019   SpO2 100%   BMI 33.57 kg/m    FHT:  FHR: 145 bpm, variability: mod,  accelerations:  10x10,  decelerations:  Mild variables UC:   Q 3-5 minutes, strong Dilation: 5 Effacement (%): 100 Cervical Position: Posterior Station: -1 Presentation: Vertex Exam by:: [email or web address], CNM  Labs: Results for orders placed or performed during the hospital encounter of 06/14/20 (from the past 24 hour(s))  Fern Test     Status: None   Collection Time: 06/14/20  5:16 AM  Result Value Ref Range   POCT Fern Test Positive = ruptured amniotic membanes   Resp Panel by RT-PCR (Flu A&B, Covid) Nasopharyngeal Swab     Status: None   Collection Time: 06/14/20  5:17 AM   Specimen: Nasopharyngeal Swab; Nasopharyngeal(NP) swabs in vial transport medium  Result Value Ref Range   SARS Coronavirus 2 by RT PCR NEGATIVE NEGATIVE   Influenza A by PCR NEGATIVE NEGATIVE   Influenza B by PCR NEGATIVE NEGATIVE  Type and screen  [facility]     Status: None   Collection Time: 06/14/20  5:20 AM  Result Value Ref Range   ABO/RH(D) O POS    Antibody Screen NEG    Sample Expiration      06/17/2020,2359 Performed at [facility], [address], [place], [place]   CBC     Status: Abnormal   Collection Time: 06/14/20  5:46 AM  Result Value Ref Range   WBC 11.2 (H) 4.0 - 10.5 K/uL   RBC 4.15 3.87 - 5.11 MIL/uL   Hemoglobin 11.9 (L) 12.0 - 15.0 g/dL   HCT 06/16/20 54.2 - 70.6 %   MCV 87.2 80.0 - 100.0 fL   MCH 28.7 26.0 - 34.0 pg   MCHC 32.9 30.0 - 36.0 g/dL   RDW 23.7 62.8 - 31.5 %   Platelets 240 150 - 400 K/uL   nRBC 0.0 0.0 - 0.2 %    Assessment / Plan: [redacted]w[redacted]d week IUP Labor: Active Fetal Wellbeing:  Category I-II, overall  reassuring.  Pain Control:  Requesting epidural Anticipated MOD:  SVD  [redacted]w[redacted]d Katrinka Blazing, CNM 06/14/2020 8:40 AM

## 2020-06-14 NOTE — Lactation Note (Signed)
This note was copied from a baby's chart. Lactation Consultation Note  Patient Name: Tracie [name] EUMPN'T Date: 06/14/2020 Reason for consult: Initial assessment;Early term 37-38.6wks Age:38 hours  Initial visit to 6 hours old infant of a P2 mother. Infant is sleeping in basinet upon arrival. Talked to mother about hand expression and demonstrated technique. Colostrum easily expressed. Reviewed plan with mother.  Mother plans to use formula only when away from infant in case someone else has to feed him. Her main goal is to breastfeed.  Mother does not have a breast pump, LC provided a manual pump to be use as needed.   Plan: 1-Skin to skin 2-Aim for a deep, comfortable latch 3-Breastfeeding on demand or 8-12 times in 24h period. 4-Keep infant awake during breastfeeding session: massaging breast, infant's hand/shoulder/feet 5-Monitor voids and stools as signs good intake.  6-Encouraged maternal rest, hydration and food intake.  7-Contact LC as needed for feeds/support/concerns/questions   All questions answered at this time. Provided Lactation services brochure and promoted [employer] booklet information.   Maternal Data Has patient been taught Hand Expression?: Yes Does the patient have breastfeeding experience prior to this delivery?: Yes How long did the patient breastfeed?: 6 months 17 years ago  Feeding Mother's Current Feeding Choice: Breast Milk and Formula  Interventions Interventions: Breast feeding basics reviewed;Skin to skin;Hand express;Breast massage;Hand pump;Expressed milk;Education  Discharge WIC Program: Yes  Consult Status Consult Status: Follow-up Date: 06/15/20 Follow-up type: In-patient    Tracie [name] 06/14/2020, 4:46 PM

## 2020-06-14 NOTE — MAU Note (Signed)
Pt reports ctx's that started at 1300. Pt reports 30 minutes ago she noticed clear with brown fluid leaking.   Reports +FM  Reports seeing some blood when she wipes.

## 2020-06-14 NOTE — Lactation Note (Signed)
This note was copied from a baby's chart. Lactation Consultation Note  Patient Name: Tracie [name] [id number] Date: 06/14/2020 Reason for consult: L&D Initial assessment Age:38 hours  L&D consult with >60 minutes old infant and P2 mother. Parents arepresent at time of consult. Congratulated them on their newborn. Assisted with latch laid back position right breast. Discussed STS as ideal transition for infants after birth helping with temperature, blood sugar and comfort. Talked about primal reflexes such as rooting, hands to mouth, searching for the breast among others.   No hand expression assistance at this time. Explained LC services availability during postpartum stay. Thanked family for their time.   Maternal Data Has patient been taught Hand Expression?: No Does the patient have breastfeeding experience prior to this delivery?: Yes How long did the patient breastfeed?: 6 months  Feeding Mother's Current Feeding Choice: Breast Milk  LATCH Score Latch: Grasps breast easily, tongue down, lips flanged, rhythmical sucking.  Audible Swallowing: Spontaneous and intermittent  Type of Nipple: Everted at rest and after stimulation  Comfort (Breast/Nipple): Soft / non-tender  Hold (Positioning): Assistance needed to correctly position infant at breast and maintain latch.  LATCH Score: 9   Lactation Tools Discussed/Used    Interventions Interventions: Breast feeding basics reviewed;Assisted with latch;Skin to skin;Education  Discharge    Consult Status Date: 06/14/20 Follow-up type: In-patient    Linels A Higuera [name] 06/14/2020, 11:43 AM

## 2020-06-14 NOTE — Anesthesia Preprocedure Evaluation (Signed)
Anesthesia Evaluation    Reviewed: Allergy & Precautions, Patient's Chart, lab work & pertinent test results  Airway Mallampati: I       Dental no notable dental hx.    Pulmonary    Pulmonary exam normal        Cardiovascular negative cardio ROS   Rhythm:Regular Rate:Normal     Neuro/Psych  Headaches, PSYCHIATRIC DISORDERS Anxiety Depression    GI/Hepatic   Endo/Other    Renal/GU      Musculoskeletal   Abdominal   Peds  Hematology   Anesthesia Other Findings   Reproductive/Obstetrics (+) Pregnancy                             Anesthesia Physical Anesthesia Plan  ASA: II  Anesthesia Plan: Epidural   Post-op Pain Management:    Induction:   PONV Risk Score and Plan: 0 and Ondansetron  Airway Management Planned: Natural Airway  Additional Equipment: None  Intra-op Plan:   Post-operative Plan:   Informed Consent:     Interpreter used for interveiw  Plan Discussed with:   Anesthesia Plan Comments: (Lab Results      Component                Value               Date                      WBC                      11.2 (H)            06/14/2020                HGB                      11.9 (L)            06/14/2020                HCT                      36.2                06/14/2020                MCV                      87.2                06/14/2020                PLT                      240                 06/14/2020           )        Anesthesia Quick Evaluation

## 2020-06-14 NOTE — Progress Notes (Signed)
S: CTBS by RN for pt reporting facial numbness, dizziness, frontal HA 7/10 and seeing floaters since shortly before transferring to Mother Baby Unit. Sx unchanged. Numbness is bilat entire face. Denies difficulty w/ speech, Chest pain, SOB. Unable to ambulate w/out assistance post-epidural. Only one elevated BP upon arrive to MAU. None since.   IN-person Spanish interpreter used.   O: Patient Vitals for the past 24 hrs:  BP Temp Temp src Pulse Resp SpO2 Height Weight  06/14/20 1300 127/68 99.5 F (37.5 C) Oral (!) 106 20 100 % -- --  06/14/20 1237 129/74 -- -- (!) 107 20 -- -- --  06/14/20 1225 121/76 -- -- (!) 105 20 -- -- --  06/14/20 1218 108/79 -- -- -- 20 -- -- --  06/14/20 1214 117/72 98.7 F (37.1 C) Oral (!) 102 18 99 % -- --  06/14/20 1154 114/69 -- -- (!) 110 20 -- -- --  06/14/20 1131 99/74 98.6 F (37 C) Oral (!) 124 18 -- -- --  06/14/20 1115 135/61 -- -- (!) 115 20 -- -- --  06/14/20 1101 118/70 -- -- (!) 121 20 -- -- --  06/14/20 1055 (!) 114/53 -- -- (!) 125 18 -- -- --  Head: No pallor or cyanosis. Normocephalic. Neuro: Equal sensation, mvmt, strength or face, neck, upper extremities. Nml speech and grossly Nml vision.  GU: Scant bleeding. FF U/1  A: Facial numbness and other Sx as above likely due to anxiety after delivery from Code Apgar. Pt reports significant Anxiety Hx.  Mild tachycardia noted.  HA, vision changes w/ Nml BP. Low suspicion for Pre-E.   P: CBC, CMET LR Bolus 500 ml IBU 600 mg Encourage to eat Continuous PulseOx  Needs assistance w/ ambulation until feeling better.   Katrinka Blazing, [place], CNM 06/14/2020 1:34 PM

## 2020-06-14 NOTE — H&P (Signed)
OBSTETRIC ADMISSION HISTORY AND PHYSICAL  Prisha Hiley is a 40 y.o. female G2P1001 with IUP at [redacted]w[redacted]d by L/18 presenting for spontaneous onset of labor s/p SROM at 0400 on 06/14/20. She reports +FMs, no VB, no blurry vision, headaches or peripheral edema, and RUQ pain.  She plans on breast and bottle feeding. She requests POPs for birth control. She received her prenatal care at [facility]   Dating: By L/18 --->  Estimated Date of Delivery: 06/22/20  Sono:  @[redacted]w[redacted]d , CWD, normal anatomy, cephalic presentation, 607.3g, 34.7% EFW   Prenatal History/Complications:  - anxiety/depression (no medications) - Language barrier  Past Medical History: Past Medical History:  Diagnosis Date  . Anxiety   . Depression   . Headache     Past Surgical History: History reviewed. No pertinent surgical history.  Obstetrical History: OB History    Gravida  2   Para  1   Term  1   Preterm      AB      Living  1     SAB  0   IAB  0   Ectopic      Multiple      Live Births  1           Social History Social History   Socioeconomic History  . Marital status: Single    Spouse name: Not on file  . Number of children: Not on file  . Years of education: Not on file  . Highest education level: Not on file  Occupational History  . Not on file  Tobacco Use  . Smoking status: Never Smoker  . Smokeless tobacco: Never Used  Vaping Use  . Vaping Use: Never used  Substance and Sexual Activity  . Alcohol use: No  . Drug use: No  . Sexual activity: Yes    Birth control/protection: Condom  Other Topics Concern  . Not on file  Social History Narrative  . Not on file   Social Determinants of Health   Financial Resource Strain: Not on file  Food Insecurity: Not on file  Transportation Needs: Not on file  Physical Activity: Not on file  Stress: Not on file  Social Connections: Not on file    Family History: Family History  Problem Relation Age of Onset  .  Hypertension Neg Hx     Allergies: No Known Allergies  Medications Prior to Admission  Medication Sig Dispense Refill Last Dose  . acetaminophen (TYLENOL) 325 MG tablet Take 2 tablets (650 mg total) by mouth every 4 (four) hours as needed. 100 tablet 2 06/13/2020 at Unknown time  . aspirin EC 81 MG tablet Take 1 tablet (81 mg total) by mouth daily. Take after 12 weeks for prevention of preeclampsia later in pregnancy 300 tablet 2 06/13/2020 at Unknown time  . Prenatal Vit-Fe Fumarate-FA (MULTIVITAMIN-PRENATAL) 27-0.8 MG TABS tablet Take 1 tablet by mouth daily at 12 noon.   06/13/2020 at Unknown time  . omeprazole (PRILOSEC) 20 MG capsule Take 1 capsule (20 mg total) by mouth daily. 30 capsule 0   . Vitamin D, Ergocalciferol, (DRISDOL) 1.25 MG (50000 UNIT) CAPS capsule Take 1 capsule (50,000 Units total) by mouth every 7 (seven) days. 4 capsule 1      Review of Systems   All systems reviewed and negative except as stated in HPI  Blood pressure 121/76, pulse 83, temperature 98.5 F (36.9 C), temperature source Oral, resp. rate (!) 24, weight 86 kg, last menstrual period 09/16/2019, SpO2  100 %. General appearance: alert, cooperative and appears stated age Lungs: normal WOB Heart: regular rate  Abdomen: soft, non-tender Extremities: no sign of DVT Presentation: cephalic Fetal monitoringBaseline: 150 bpm, Variability: Good {> 6 bpm), Accelerations: Reactive and Decelerations: Absent Uterine activityFrequency: Every 3-4 minutes Dilation: 3 Effacement (%): 70 Station: -2 Exam by:: Zenia Resides, RN   Prenatal labs: ABO, Rh: --/--/O POS (03/01 0427) Antibody: NEG (03/01 0427) Rubella: 9.58 (09/29 1456) RPR: Reactive (01/13 0925)  HBsAg: Negative (09/29 1456)  HIV: Non Reactive (01/13 0925)  GBS: Negative/-- (03/10 1449)  2 hr Glucola wnl Genetic screening declined Anatomy US wnl  Prenatal Transfer Tool  Maternal Diabetes: No Genetic Screening: Declined Maternal  Ultrasounds/Referrals: Normal Fetal Ultrasounds or other Referrals:  None Maternal Substance Abuse:  No Significant Maternal Medications:  None Significant Maternal Lab Results: Group B Strep negative  Results for orders placed or performed during the hospital encounter of 06/14/20 (from the past 24 hour(s))  Fern Test   Collection Time: 06/14/20  5:16 AM  Result Value Ref Range   POCT Fern Test Positive = ruptured amniotic membanes     Patient Active Problem List   Diagnosis Date Noted  . Vaginal bleeding in pregnancy, third trimester 05/19/2020  . Sciatic pain, right 04/30/2020  . Biological false positive RPR test 04/05/2020  . Multigravida of advanced maternal age in third trimester 04/02/2020  . Language barrier 04/02/2020  . Supervision of high risk pregnancy, antepartum 12/18/2019  . Anxiety and depression 06/12/2014  . Generalized anxiety disorder 06/12/2014  . Bilateral chronic knee pain 10/28/2013    Assessment/Plan:  Jahlia Omura is a 40 y.o. G2P1001 at [redacted]w[redacted]d here for SOL s/p SROM at 0400 on 05/17/20.  #Labor: Will manage expectantly and augment as clinically indicated.  #Pain: Pt plans for epidural #FWB: Category 2 strip given variable decelerations with contractions; will plan to initiate amnioinfusion if variable decelerations become recurrent. Reassuringly, +accels and moderate variability. #ID: GBS negative #MOF: both #MOC: POPs #Circ: declines #Anxiety/Depression: plan for SW consult in postpartum period.  Sheila Oats, MD OB Fellow, Faculty Practice 06/14/2020 8:59 AM

## 2020-06-14 NOTE — Progress Notes (Signed)
Tracie [name] is a 40 y.o. G2P1001 at [redacted]w[redacted]d.  Subjective: Comfortable w/ epidrual.   CTBS by RN for deep, repetitive variables. BP dropped after epidural. Phenylephrine given x 2. Terb given.    Objective: BP 135/61   Pulse (!) 115   Temp 98.7 F (37.1 C) (Oral)   Resp 20   Ht 5\' 3"  (1.6 m)   Wt 86 kg   LMP 09/16/2019   SpO2 100%   BMI 33.57 kg/m    FHT:  FHR: 145 bpm, variability: mod,  accelerations:  15x15,  decelerations:  variables UC:   Q 3 minutes, strong Dilation: 10 Dilation Complete Date: 06/14/20 Dilation Complete Time: 0958 Effacement (%): 100 Cervical Position: Posterior Station: Plus 1 Presentation: Vertex Exam by:: [email or web address], CNM  Light MSF  Labs: NA  Assessment / Plan: [redacted]w[redacted]d week IUP. Light meconium now noted.  Labor: Rapid progression to 10 cm. Begin second stage. Consider requesting vacuum if decels continue.  Fetal Wellbeing:  Category II, but reassured by moderate variability, Nml baseline. Dr. [redacted]w[redacted]d aware.  Pain Control:  Epidural.  Anticipated MOD:  SVD  [name], CNM 06/14/2020 9:58 AM

## 2020-06-14 NOTE — Anesthesia Procedure Notes (Signed)
Epidural Patient location during procedure: OB Start time: 06/14/2020 9:03 AM End time: 06/14/2020 9:09 AM  Staffing Anesthesiologist: Shelton Silvas, MD Performed: anesthesiologist   Preanesthetic Checklist Completed: patient identified, IV checked, site marked, risks and benefits discussed, surgical consent, monitors and equipment checked, pre-op evaluation and timeout performed  Epidural Patient position: sitting Prep: DuraPrep Patient monitoring: heart rate, continuous pulse ox and blood pressure Approach: midline Location: L3-L4 Injection technique: LOR saline  Needle:  Needle type: Tuohy  Needle gauge: 17 G Needle length: 9 cm Catheter type: closed end flexible Catheter size: 20 Guage Test dose: negative and 1.5% lidocaine  Assessment Events: blood not aspirated, injection not painful, no injection resistance and no paresthesia  Additional Notes LOR @ 4  Patient identified. Risks/Benefits/Options discussed with patient including but not limited to bleeding, infection, nerve damage, paralysis, failed block, incomplete pain control, headache, blood pressure changes, nausea, vomiting, reactions to medications, itching and postpartum back pain. Confirmed with bedside nurse the patient's most recent platelet count. Confirmed with patient that they are not currently taking any anticoagulation, have any bleeding history or any family history of bleeding disorders. Patient expressed understanding and wished to proceed. All questions were answered. Sterile technique was used throughout the entire procedure. Please see nursing notes for vital signs. Test dose was given through epidural catheter and negative prior to continuing to dose epidural or start infusion. Warning signs of high block given to the patient including shortness of breath, tingling/numbness in hands, complete motor block, or any concerning symptoms with instructions to call for help. Patient was given instructions on  fall risk and not to get out of bed. All questions and concerns addressed with instructions to call with any issues or inadequate analgesia.    Reason for block:procedure for pain

## 2020-06-14 NOTE — Anesthesia Postprocedure Evaluation (Signed)
Anesthesia Post Note  Patient: Tracie [name]  Procedure(s) Performed: AN AD HOC LABOR EPIDURAL     Patient location during evaluation: Mother Baby Anesthesia Type: Epidural Level of consciousness: awake and alert Pain management: pain level controlled Vital Signs Assessment: post-procedure vital signs reviewed and stable Respiratory status: spontaneous breathing, nonlabored ventilation and respiratory function stable Cardiovascular status: stable Postop Assessment: no headache, no backache and epidural receding Anesthetic complications: no   No complications documented.  Last Vitals:  Vitals:   06/14/20 1300 06/14/20 1706  BP: 127/68 (!) 111/57  Pulse: (!) 106 93  Resp: 20 18  Temp: 37.5 C 37.1 C  SpO2: 100% 99%    Last Pain:  Vitals:   06/14/20 1706  TempSrc: Oral  PainSc: 0-No pain   Pain Goal: Patients Stated Pain Goal: 3 (06/14/20 1215)                 [name]

## 2020-06-14 NOTE — Discharge Summary (Signed)
Postpartum Discharge Summary   Patient Name: Tracie [name] DOB: 1981/02/01 MRN: [id number]  Date of admission: 06/14/2020 Delivery date:06/14/2020  Delivering provider: Manya Silvas  Date of discharge: 06/15/2020  Admitting diagnosis: Supervision of low-risk pregnancy, third trimester [Z34.93] Intrauterine pregnancy: [redacted]w[redacted]d    Secondary diagnosis:  Principal Problem:   Vaginal delivery Active Problems:   Anxiety and depression   Language barrier   Supervision of low-risk pregnancy, third trimester  Additional problems: as noted above  Discharge diagnosis: Term Pregnancy Delivered                                              Post partum procedures:none Augmentation: N/A Complications: None  Hospital course: Onset of Labor With Vaginal Delivery      40y.o. yo [id number] admitted in Latent Labor on 06/14/2020. Patient had an uncomplicated labor course as follows:  Membrane Rupture Time/Date: 4:00 AM ,06/14/2020   Delivery Method:Vaginal, Spontaneous  Episiotomy: None  Lacerations:  None  Patient had an uncomplicated postpartum course.  She is ambulating, tolerating a regular diet, passing flatus, and urinating well. Patient is discharged home in stable condition on 06/15/20.  Newborn Data: Birth date:06/14/2020  Birth time:10:24 AM  Gender:Female  Living status:Living  Apgars:8 ,9  Weight:2946 g   Magnesium Sulfate received: No BMZ received: No Rhophylac:N/A MMR:N/A T-DaP: offered prior to discharge Flu: offered prior to discharge Transfusion:No  Physical exam  Vitals:   06/14/20 1706 06/14/20 2055 06/15/20 0011 06/15/20 0550  BP: (!) 111/57 122/64 114/74 106/69  Pulse: 93 82 81 74  Resp: _0 Temp: 98.7 F (37.1 C) 99 F (37.2 C) 98.9 F (37.2 C) 98.2 F (36.8 C)  TempSrc: Oral Oral Oral Oral  SpO2: 99% 99% 99% 100%  Weight:      Height:       General: alert, cooperative and no distress Lochia: appropriate Uterine Fundus:  firm Incision: N/A DVT Evaluation: No evidence of DVT seen on physical exam. No cords or calf tenderness. No significant calf/ankle edema. Labs: Lab Results  Component Value Date   WBC 18.2 (H) 06/14/2020   HGB 11.4 (L) 06/14/2020   HCT 35.0 (L) 06/14/2020   MCV 88.4 06/14/2020   PLT 220 06/14/2020   CMP Latest Ref Rng & Units 06/14/2020  Glucose 70 - 99 mg/dL 126(H)  BUN 6 - 20 mg/dL <5(L)  Creatinine 0.44 - 1.00 mg/dL 0.63  Sodium 135 - 145 mmol/L 135  Potassium 3.5 - 5.1 mmol/L 3.5  Chloride 98 - 111 mmol/L 109  CO2 22 - 32 mmol/L 16(L)  Calcium 8.9 - 10.3 mg/dL 8.5(L)  Total Protein 6.5 - 8.1 g/dL 5.9(L)  Total Bilirubin 0.3 - 1.2 mg/dL 0.4  Alkaline Phos 38 - 126 U/L 112  AST 15 - 41 U/L 21  ALT 0 - 44 U/L 13   Edinburgh Score: Edinburgh Postnatal Depression Scale Screening Tool 06/14/2020  I have been able to laugh and see the funny side of things. (No Data)     After visit meds:  Allergies as of 06/15/2020   No Known Allergies     Medication List    STOP taking these medications   aspirin EC 81 MG tablet   omeprazole 20 MG capsule Commonly known as: PRILOSEC   Vitamin D (Ergocalciferol) 1.25 MG (50000 UNIT) Caps  capsule Commonly known as: DRISDOL     TAKE these medications   acetaminophen 325 MG tablet Commonly known as: Tylenol Take 2 tablets (650 mg total) by mouth every 6 (six) hours as needed. What changed: when to take this   coconut oil Oil Apply 1 application topically as needed (nipple pain).   ibuprofen 600 MG tablet Commonly known as: ADVIL Take 1 tablet (600 mg total) by mouth every 8 (eight) hours as needed.   multivitamin-prenatal 27-0.8 MG Tabs tablet Take 1 tablet by mouth daily at 12 noon.   norethindrone 0.35 MG tablet Commonly known as: Ortho Micronor Take 1 tablet (0.35 mg total) by mouth daily.        Discharge home in stable condition Infant Feeding: Bottle and Breast Infant Disposition:home with mother Discharge  instruction: per After Visit Summary and Postpartum booklet. Activity: Advance as tolerated. Pelvic rest for 6 weeks.  Diet: routine diet Future Appointments: No future appointments. Follow up Visit:  Please schedule this patient for a In person postpartum visit in 4 weeks with the following provider: Any provider. Additional Postpartum F/U: 2 week mood check Low risk pregnancy complicated by: anxiety/depression (no medications) Delivery mode:  Vaginal, Spontaneous  Anticipated Birth Control:  POPs  Gibran Veselka, Gildardo Cranker, MD OB Fellow, [facility] 06/15/2020 8:50 AM

## 2020-06-15 ENCOUNTER — Other Ambulatory Visit (HOSPITAL_COMMUNITY): Payer: Self-pay | Admitting: Obstetrics and Gynecology

## 2020-06-15 DIAGNOSIS — O99355 Diseases of the nervous system complicating the puerperium: Secondary | ICD-10-CM

## 2020-06-15 DIAGNOSIS — G8918 Other acute postprocedural pain: Secondary | ICD-10-CM

## 2020-06-15 LAB — RPR: RPR Ser Ql: NONREACTIVE

## 2020-06-15 MED ORDER — ACETAMINOPHEN 325 MG PO TABS: 650.0000 mg | ORAL_TABLET | Freq: Four times a day (QID) | ORAL | 2 refills | Status: DC | PRN

## 2020-06-15 MED ORDER — NORETHINDRONE 0.35 MG PO TABS: 1.0000 | ORAL_TABLET | Freq: Every day | ORAL | 6 refills | Status: DC

## 2020-06-15 MED ORDER — COCONUT OIL OIL: 1.0000 "application " | TOPICAL_OIL | 0 refills | Status: DC | PRN

## 2020-06-15 MED ORDER — IBUPROFEN 600 MG PO TABS: 600.0000 mg | ORAL_TABLET | Freq: Three times a day (TID) | ORAL | 0 refills | Status: DC | PRN

## 2020-06-15 MED FILL — IBUPROFEN 600 MG TABLET: 600 | 10 days supply | Qty: 30 | Fill #0

## 2020-06-15 MED FILL — NORETHINDRONE 0.35 MG TABS: 0.35 | 28 days supply | Qty: 28 | Fill #0

## 2020-06-15 NOTE — Lactation Note (Signed)
This note was copied from a baby's chart. Lactation Consultation Note  Patient Name: Tracie [name] IOMBT'D Date: 06/15/2020 Reason for consult: Follow-up assessment;Mother's request;Early term 37-38.6wks;Nipple pain/trauma Age:38 hours   Talked with Mom with help of a translator Geri Seminole [phone].  Mom fed Enfamil 20 cal/oz 30 ml at 11. LC reviewed with Mom breastfeeding supplementation guideline after offering infant both breasts during a feed. If volume of formula is too much, infant may not take from the breasts and affect her milk supply. Mom stated she may have missed a urine with the diaper changes. LC went over the color change when urine occurs with parents.   Mom had some pain with latching. LC reviewed how to flange out lips and ensure infant transferring noting changes with breast compression. Mom stated with changes we made with latch, pain had resolved.  Mom offered comfort gels to help with nipple soreness. Mom aware to not use it with coconut oil, rinse between use and discard after 6 days.   Mom one compression stripe on the left breast, no abrasions noted.   Plan 1. To feed based on cues 8-12x in 24 hr period no more than 3 hrs without an attempt. Mom to offer both breasts during feed, follow with paced bottle feeding with EBM/forumla based on breastfeeding supplementation guidelines.  2. Manual pump q 3 hrs for 15 minutes.  3. Engorgement signs, symptoms and prevention reviewed.  4. All questions answered at the end of the visit.   Maternal Data Has patient been taught Hand Expression?: Yes  Feeding Mother's Current Feeding Choice: Breast Milk and Formula  LATCH Score Latch: Repeated attempts needed to sustain latch, nipple held in mouth throughout feeding, stimulation needed to elicit sucking reflex.  Audible Swallowing: Spontaneous and intermittent  Type of Nipple: Everted at rest and after stimulation  Comfort (Breast/Nipple): Filling, red/small blisters  or bruises, mild/mod discomfort  Hold (Positioning): Assistance needed to correctly position infant at breast and maintain latch.  LATCH Score: 7   Lactation Tools Discussed/Used Tools: Pump;Flanges;Comfort gels Flange Size: 27 Breast pump type: Manual Reason for Pumping: increase stimulation Pumping frequency: every 3 hrs for 15 minutes  Interventions    Discharge Discharge Education: Engorgement and breast care;Warning signs for feeding baby Pump: Manual  Consult Status Consult Status: Follow-up Date: 06/16/20 Follow-up type: In-patient    Nailyn Dearinger  Nicholson-Springer 06/15/2020, 12:07 PM

## 2020-06-15 NOTE — Discharge Instructions (Signed)

## 2020-06-15 NOTE — Social Work (Signed)
CSW received consult for hx of Anxiety and Depression.  CSW met with MOB to offer support and complete assessment.     CSW utilized [employer] interpreter Asencion Partridge [phone]. CSW introduced self and role. CSW observed infant in bassinet and FOB sitting on couch. CSW asked permission to speak with MOB in private, MOB declined and stated FOB could remain in room. CSW informed MOB of the reason for consult and assessed current mood. MOB reported she is currently doing well and had a good pregnancy overall. MOB stated she was diagnosed with anxiety and depression about 5 years ago and acknowledged she experienced some symptoms during pregnancy but was able to cope with the support of her family. MOB reported she was taking Zoloft 67m, but stopped during pregnancy. MOB stated she plans to restart with postpartum. MOB did not report ever attending therapy to treat. MOB identified her family and spouse as supports. MOB denies any current SI or HI.  CSW provided education regarding the baby blues period versus PPD. CSW provided the New Mom Checklist and encouraged MOB to self evaluate and contact a medical professional if symptoms are noted at any time.  CSW provided review of Sudden Infant Death Syndrome (SIDS) precautions.  MOB reported she has everything needed for infant, including a safe sleep space. MOB has identified a pediatrician and reports no barriers to follow-up care. MOB stated [place] has [place] was informed she can update them on infant's birth.  MOB denied any additional resources at this time.   CSW identifies no further need for intervention and no barriers to discharge at this time.  CDarra Lis LPine GroveWork WEnterprise Productsand CMolson [employer]([phone]

## 2020-06-17 ENCOUNTER — Other Ambulatory Visit: Payer: Self-pay | Admitting: Obstetrics & Gynecology

## 2020-06-17 ENCOUNTER — Encounter: Payer: Self-pay | Admitting: Student

## 2020-06-17 ENCOUNTER — Telehealth: Payer: Self-pay | Admitting: *Deleted

## 2020-06-17 MED ORDER — CEFADROXIL 500 MG PO CAPS: 500.0000 mg | ORAL_CAPSULE | Freq: Two times a day (BID) | ORAL | 0 refills | Status: DC

## 2020-06-17 NOTE — Telephone Encounter (Signed)
Pt called via interpreter services stating she is having burring with urination and the feeling if urgency and un able to empty her bladder. Pt did not have any laceration note din her delivery summary. Will go ahead and treat for possible UTI, and if not better to call the office back after taking meds. Pt verbalizes and understands. Pt is breast feeding.

## 2020-06-18 MED FILL — CEFACLOR 500 MG CAPSULE: 500 | 7 days supply | Qty: 14 | Fill #0

## 2020-06-23 ENCOUNTER — Other Ambulatory Visit: Payer: Self-pay

## 2020-06-23 ENCOUNTER — Ambulatory Visit (INDEPENDENT_AMBULATORY_CARE_PROVIDER_SITE_OTHER): Payer: Self-pay

## 2020-06-23 VITALS — BP 125/82 | HR 111

## 2020-06-23 DIAGNOSIS — F419 Anxiety disorder, unspecified: Secondary | ICD-10-CM

## 2020-06-23 DIAGNOSIS — F32A Depression, unspecified: Secondary | ICD-10-CM

## 2020-06-23 MED ORDER — HYDROXYZINE HCL 25 MG PO TABS
25.0000 mg | ORAL_TABLET | Freq: Three times a day (TID) | ORAL | 2 refills | Status: DC | PRN
  Filled 2020-06-23: qty 30, 10d supply, fill #0
  Filled 2020-11-16: qty 30, 10d supply, fill #1
  Filled 2020-12-24: qty 30, 10d supply, fill #2

## 2020-06-23 NOTE — Progress Notes (Signed)
Pt here for a postpartum mood check. Pt scored 15 on Edinburgh Depression Screen.  Discussed results with Dr. Macon Large and advises pt to continue Atarax as pt has taken before for anxiety.  Pt referred to [facility] for further evaluation.

## 2020-06-24 ENCOUNTER — Other Ambulatory Visit: Payer: Self-pay

## 2020-06-29 ENCOUNTER — Telehealth: Payer: Self-pay | Admitting: *Deleted

## 2020-06-29 NOTE — Telephone Encounter (Signed)
Nikki RN called to let us know that at her home visit pt scored a 10 in her edinburgh screen. Informed that was improvement from when we saw her. Pt also has been referred to Sue Lush and will send follow up on that.  Pt also has appointment with community wellness in May for follow up as well.

## 2020-07-01 ENCOUNTER — Telehealth: Payer: Self-pay | Admitting: Radiology

## 2020-07-01 NOTE — Telephone Encounter (Signed)
Patient was informed of in person visit with Sue Lush at the [place] office, for 07/14/20 @ 10:30

## 2020-07-14 ENCOUNTER — Other Ambulatory Visit: Payer: Self-pay | Admitting: Obstetrics & Gynecology

## 2020-07-14 ENCOUNTER — Ambulatory Visit (INDEPENDENT_AMBULATORY_CARE_PROVIDER_SITE_OTHER): Payer: Self-pay | Admitting: Licensed Clinical Social Worker

## 2020-07-14 ENCOUNTER — Other Ambulatory Visit: Payer: Self-pay

## 2020-07-14 DIAGNOSIS — F411 Generalized anxiety disorder: Secondary | ICD-10-CM

## 2020-07-14 DIAGNOSIS — F32A Depression, unspecified: Secondary | ICD-10-CM

## 2020-07-14 DIAGNOSIS — F419 Anxiety disorder, unspecified: Secondary | ICD-10-CM

## 2020-07-14 MED ORDER — SERTRALINE HCL 50 MG PO TABS
50.0000 mg | ORAL_TABLET | Freq: Every day | ORAL | 5 refills | Status: DC
  Filled 2020-07-14: qty 30, 30d supply, fill #0

## 2020-07-14 NOTE — Progress Notes (Signed)
Patient evaluated by [facility] Gwyndolyn Saxon, [place]) who reports she has continued increased anxiety. Not alleviated by Hydroxyzine.  Zoloft recommended, this was prescribed.  Patient will be referred to mental health provider in the community.   Jaynie Collins, MD, FACOG Obstetrician & Gynecologist, [facility] for [employer], [facility]

## 2020-07-16 ENCOUNTER — Other Ambulatory Visit: Payer: Self-pay

## 2020-07-16 NOTE — BH Specialist Note (Signed)
Integrated Behavioral Health Initial In-Person Visit  MRN: [id number] Name: Tracie [name]  Number of Integrated Behavioral Health Clinician visits:: 1/6 Session Start time: 10:15am  Session End time: 10:40am Total time: 25 minutes in person at [facility]   Types of Service: Individual Psychotherapy   Interpretor:yes  Interpretor Name and Language: Blossom Hoops #[id number]   Warm Hand Off Completed.       Subjective: Tracie [name] is a 40 y.o. female accompanied by n/a Patient was referred by Dr. Macon Large MD for post partum depression and anxiety. Patient reports the following symptoms/concerns: Crying, difficulty sleeping, worry, limited social support, anxiety  Duration of problem: approx 2 months ; Severity of problem: moderate   Objective: Mood: Sad  and Affect: Tearful  Risk of harm to self or others: No risk of harm to self or others.  Life Context: Family and Social: Lives in [place]  School/Work: [employer]  Self-Care: None Reported  Life Changes: Newborn   Patient and/or Family's Strengths/Protective Factors: Limited family and Social Support   Goals Addressed: Patient will: 1. Reduce symptoms of: depression  2. Increase knowledge and/or ability of: identify triggers and collaborate development of copings skills to alleviate symptoms   3. Demonstrate ability to: self manage symptoms   Progress towards Goals: Ongoing   Interventions: Interventions utilized: supportive counseling   Standardized Assessments completed: edinburgh completed 06/23/2020 score 15  Assessment: Patient currently experiencing postpartum depression and anxiety   Patient may benefit from integrated behavioral health   Plan: 1. Follow up with behavioral health clinician on : 08/04/2020 2. Behavioral recommendations: Take prescribe medication as directed, minimize task into smaller increments to prevent burnout, listen to music and walking to decrease stress   3. Referral(s):  community health and wellness  4. "From scale of 1-10, how likely are you to follow plan?":   Tracie Saxon, LCSW

## 2020-07-21 ENCOUNTER — Ambulatory Visit (INDEPENDENT_AMBULATORY_CARE_PROVIDER_SITE_OTHER): Payer: Self-pay | Admitting: Obstetrics and Gynecology

## 2020-07-21 ENCOUNTER — Other Ambulatory Visit: Payer: Self-pay

## 2020-07-21 ENCOUNTER — Encounter: Payer: Self-pay | Admitting: Obstetrics and Gynecology

## 2020-07-21 ENCOUNTER — Other Ambulatory Visit (HOSPITAL_COMMUNITY)
Admission: RE | Admit: 2020-07-21 | Discharge: 2020-07-21 | Disposition: A | Discharge status: Home / Self Care | Payer: Self-pay | Source: Ambulatory Visit | Attending: Obstetrics and Gynecology | Admitting: Obstetrics and Gynecology

## 2020-07-21 DIAGNOSIS — O0990 Supervision of high risk pregnancy, unspecified, unspecified trimester: Secondary | ICD-10-CM

## 2020-07-21 NOTE — Progress Notes (Signed)
    Post Partum Visit Note  Tracie [name] is a 40 y.o. [id number] female who presents for a postpartum visit. She is 5 weeks postpartum following a normal spontaneous vaginal delivery.  I have fully reviewed the prenatal and intrapartum course. The delivery was at 38.6 gestational weeks.  Anesthesia: epidural. Postpartum course has been uncomplicated. Baby is doing well. Baby is feeding by both breast and bottle - Gerber. Bleeding no bleeding. Bowel function is normal. Bladder function is normal. Patient is not sexually active. Contraception method is oral progesterone-only contraceptive. Postpartum depression screening: Currently taking meds and speaking with Sue Lush. Patient receives help from her mother who is currently unemployed.    Health Maintenance Due  Topic Date Due  . COVID-19 Vaccine (3 - Booster for Pfizer series) 04/04/2020       Review of Systems Pertinent items noted in HPI and remainder of comprehensive ROS otherwise negative.  Objective:  BP 130/87   Pulse 83   Wt 175 lb (79.4 kg)   BMI 31.00 kg/m    General:  alert, cooperative and no distress   Breasts:  normal  Lungs: clear to auscultation bilaterally  Heart:  regular rate and rhythm  Abdomen: soft, non-tender; bowel sounds normal; no masses,  no organomegaly   Wound n/a  GU exam:  normal       Assessment:    There are no diagnoses linked to this encounter.  Normal postpartum exam.   Plan:   Essential components of care per ACOG recommendations:  1.  Mood and well being: Patient admits to continued anxiety and started medication a week ago. Patient has follow up appointment with Sue Lush on 5/17.  Reviewed local resources for support.  - Patient tobacco use? No.   - hx of drug use? No.    2. Infant care and feeding:  -Patient currently breastmilk feeding? Yes. Reviewed importance of draining breast regularly to support lactation.  -Social determinants of health (SDOH) reviewed in EPIC. No  concerns  3. Sexuality, contraception and birth spacing - Patient does not want a pregnancy in the next year.   - Reviewed forms of contraception in tiered fashion. Patient desired oral progesterone-only contraceptive today.   - Discussed birth spacing of 18 months  4. Sleep and fatigue -Encouraged family/partner/community support of 4 hrs of uninterrupted sleep to help with mood and fatigue  5. Physical Recovery  - Discussed patients delivery and complications.  - Patient had a Vaginal, no problems at delivery. Perineal healing reviewed. Patient expressed understanding - Patient has urinary incontinence? No. - Patient is safe to resume physical and sexual activity - Vaginal swab collected due to foul smelling discharge reported by the patient  6.  Health Maintenance - HM due items addressed - Pap smear due this year. Patient to follow up with health department or [facility]  - Last pap smear  Diagnosis  Date Value Ref Range Status  01/01/2018   Final   NEGATIVE FOR INTRAEPITHELIAL LESIONS OR MALIGNANCY.   Pap smear not done at today's visit.  -Breast Cancer screening indicated? No.   7. Chronic Disease/Pregnancy Condition follow up: None  - PCP follow up  Catalina Antigua, MD Center for [facility], [facility]

## 2020-07-22 LAB — CERVICOVAGINAL ANCILLARY ONLY
Bacterial Vaginitis (gardnerella): NEGATIVE
Candida Glabrata: NEGATIVE
Candida Vaginitis: NEGATIVE
Comment: NEGATIVE
Comment: NEGATIVE
Comment: NEGATIVE
Comment: NEGATIVE
Trichomonas: NEGATIVE

## 2020-08-04 ENCOUNTER — Ambulatory Visit: Payer: Self-pay | Admitting: Licensed Clinical Social Worker

## 2020-08-12 ENCOUNTER — Encounter: Payer: Self-pay | Admitting: Physician Assistant

## 2020-08-12 ENCOUNTER — Other Ambulatory Visit: Payer: Self-pay

## 2020-08-12 ENCOUNTER — Ambulatory Visit: Payer: Self-pay

## 2020-08-12 ENCOUNTER — Ambulatory Visit: Payer: Medicaid Other | Attending: Physician Assistant | Admitting: Physician Assistant

## 2020-08-12 VITALS — BP 121/82 | HR 86 | Resp 16 | Wt 176.4 lb

## 2020-08-12 DIAGNOSIS — K30 Functional dyspepsia: Secondary | ICD-10-CM

## 2020-08-12 DIAGNOSIS — K649 Unspecified hemorrhoids: Secondary | ICD-10-CM

## 2020-08-12 DIAGNOSIS — O99345 Other mental disorders complicating the puerperium: Secondary | ICD-10-CM

## 2020-08-12 DIAGNOSIS — M5431 Sciatica, right side: Secondary | ICD-10-CM

## 2020-08-12 DIAGNOSIS — R202 Paresthesia of skin: Secondary | ICD-10-CM

## 2020-08-12 DIAGNOSIS — F411 Generalized anxiety disorder: Secondary | ICD-10-CM

## 2020-08-12 DIAGNOSIS — E559 Vitamin D deficiency, unspecified: Secondary | ICD-10-CM | POA: Diagnosis not present

## 2020-08-12 DIAGNOSIS — R519 Headache, unspecified: Secondary | ICD-10-CM | POA: Diagnosis not present

## 2020-08-12 DIAGNOSIS — F418 Other specified anxiety disorders: Secondary | ICD-10-CM

## 2020-08-12 DIAGNOSIS — Z789 Other specified health status: Secondary | ICD-10-CM

## 2020-08-12 MED ORDER — SERTRALINE HCL 50 MG PO TABS
50.0000 mg | ORAL_TABLET | Freq: Every day | ORAL | 5 refills | Status: DC
  Filled 2020-08-12: qty 30, 30d supply, fill #0

## 2020-08-12 MED ORDER — OMEPRAZOLE 20 MG PO CPDR
20.0000 mg | DELAYED_RELEASE_CAPSULE | Freq: Every day | ORAL | 3 refills | Status: DC
  Filled 2020-08-12 – 2021-07-15 (×2): qty 30, 30d supply, fill #0

## 2020-08-12 MED ORDER — METHOCARBAMOL 500 MG PO TABS
1000.0000 mg | ORAL_TABLET | Freq: Three times a day (TID) | ORAL | 1 refills | Status: DC | PRN
  Filled 2020-08-12: qty 90, 15d supply, fill #0

## 2020-08-12 MED ORDER — HYDROCORTISONE ACETATE 25 MG RE SUPP
25.0000 mg | Freq: Two times a day (BID) | RECTAL | 0 refills | Status: DC
  Filled 2020-08-12: qty 12, 6d supply, fill #0

## 2020-08-12 NOTE — Progress Notes (Signed)
Patient ID: Tracie [name], female   DOB: June 21, 1980, 40 y.o.   MRN: [id number]   Tracie [name], is a 40 y.o. female  [id number]  [id number]  DOB - 1980/12/17  Subjective:  Chief Complaint and HPI: Tracie [name] is a 40 y.o. female here today for HA and BLE leg paresthesias and leg pain.  No SOB.  No calf redness or swelling. No CP. Symptoms are from the B knee down.  She has a h/o chronic B knee pain.  These symptoms and the HA have been daily since giving birth.  HA is all over her head.  Sometimes see spots with HA.  Relieved with tylenol and no other issues with vision.  She is only drinking 3 glasses water daily.  Not sleeping through the night.  Baby is healthy.   SVD 3/27 at 38.[redacted] weeks gestation.  No complications.  She is bottle and breast feeding and plans to stop breast feeding in 1 month.  Wants Rx robaxin for after she finishes breast feeding.    She wants a RF on omeprazole  Legs feel weak and numb since delivery.  No calf pain or swelling.  Also having HA almost daily since giving birth.   ROS:   Constitutional:  No f/c, No night sweats, No unexplained weight loss. EENT:  No vision changes, No blurry vision, No hearing changes. No mouth, throat, or ear problems.  Respiratory: No cough, No SOB Cardiac: No CP, no palpitations GI:  No abd pain, No N/V/D. GU: No Urinary s/sx Musculoskeletal: see above Neuro: no dizziness, no motor weakness. + HA Skin: No rash Endocrine:  No polydipsia. No polyuria.  Psych: Denies SI/HI  No problems updated.  ALLERGIES: No Known Allergies  PAST MEDICAL HISTORY: Past Medical History:  Diagnosis Date  . Anxiety   . Depression   . Headache     MEDICATIONS AT HOME: Prior to Admission medications   Medication Sig Start Date End Date Taking? Authorizing Provider  hydrocortisone (ANUSOL-HC) 25 MG suppository Place 1 suppository (25 mg total) rectally 2 (two) times daily. 08/12/20  Yes Anders Simmonds, PA-C   methocarbamol (ROBAXIN) 500 MG tablet Take 2 tablets (1,000 mg total) by mouth every 8 (eight) hours as needed for muscle spasms. 08/12/20  Yes Anders Simmonds, PA-C  acetaminophen (TYLENOL) 325 MG tablet Take 2 tablets (650 mg total) by mouth every 6 (six) hours as needed. Patient not taking: Reported on 07/21/2020 06/15/20 06/15/21  Sheila Oats, MD  cefaclor (CECLOR) 500 MG capsule TAKE 1 CAPSULE (500 MG TOTAL) BY MOUTH 2 (TWO) TIMES DAILY. Patient not taking: Reported on 07/21/2020 06/17/20 06/17/21  Tereso Newcomer, MD  cefadroxil (DURICEF) 500 MG capsule Take 1 capsule (500 mg total) by mouth 2 (two) times daily. 06/17/20   Anyanwu, Jethro Bastos, MD  coconut oil OIL Apply 1 application topically as needed (nipple pain). Patient not taking: Reported on 07/21/2020 06/15/20   Sheila Oats, MD  hydrOXYzine (ATARAX/VISTARIL) 25 MG tablet Take 1 tablet (25 mg total) by mouth 3 (three) times daily as needed. 06/23/20   Anyanwu, Jethro Bastos, MD  ibuprofen (ADVIL) 600 MG tablet TAKE 1 TABLET (600 MG TOTAL) BY MOUTH EVERY 8 (EIGHT) HOURS AS NEEDED. 06/15/20 06/15/21  Sheila Oats, MD  norethindrone (MICRONOR) 0.35 MG tablet TAKE 1 TABLET (0.35 MG TOTAL) BY MOUTH DAILY. 06/15/20 06/15/21  Sheila Oats, MD  Omeprazole 20 MG TBEC Take 1 tablet (20 mg total) by mouth daily. 08/12/20  Anders Simmonds, PA-C  Prenatal Vit-Fe Fumarate-FA (MULTIVITAMIN-PRENATAL) 27-0.8 MG TABS tablet Take 1 tablet by mouth daily at 12 noon.    [provider]  sertraline (ZOLOFT) 50 MG tablet Take 1 tablet (50 mg total) by mouth daily. 08/12/20   Anders Simmonds, PA-C     Objective:  EXAM:   Vitals:   08/12/20 1030  BP: 121/82  Pulse: 86  Resp: 16  SpO2: 99%  Weight: 176 lb 6.4 oz (80 kg)    General appearance : A&OX3. NAD. Non-toxic-appearing HEENT: Atraumatic and Normocephalic.  PERRLA. EOM intact.  Neck: supple, no JVD. No cervical lymphadenopathy. No thyromegaly Chest/Lungs:  Breathing-non-labored, Good air  entry bilaterally, breath sounds normal without rales, rhonchi, or wheezing  CVS: S1 S2 regular, no murmurs, gallops, rubs  BLE without erythema or swelling and B neg Homan's sign Extremities: Bilateral Lower Ext shows no edema, both legs are warm to touch with = pulse throughout Neurology:  CN II-XII grossly intact, Non focal.   Psych:  TP linear. J/I WNL. Normal speech. Appropriate eye contact and affect.  Skin:  No Rash  Data Review Lab Results  Component Value Date   HGBA1C 5.5 12/18/2019   HGBA1C 5.6 10/23/2015   HGBA1C 5.5 09/09/2013     Assessment & Plan   1. Postpartum anxiety Postpartum condition-higher risk.  I spent additional time taking h/p due to this as well as patient education. Drink 100 ounces water daily Also address these concerns with your gynecologist June 1.  Go to the emergency department if any of your symptoms worsen, calf pain/redness or swelling develop, sever Headache, chest pain or shortness of breath  2. Vitamin D deficiency disease - Vitamin D, 25-hydroxy  3. Nonintractable headache, unspecified chronicity pattern, unspecified headache type Use tylenol or advil - Comprehensive metabolic panel - Thyroid Panel With TSH - CBC with Differential/Platelet  4. Paresthesias - Comprehensive metabolic panel - Thyroid Panel With TSH - Vitamin D, 25-hydroxy - methocarbamol (ROBAXIN) 500 MG tablet; Take 2 tablets (1,000 mg total) by mouth every 8 (eight) hours as needed for muscle spasms.  Dispense: 90 tablet; Refill: 1  5. Language barrier AMN([place]) interpreters used and additional time performing visit was required.   6. Hemorrhoids, unspecified hemorrhoid type - hydrocortisone (ANUSOL-HC) 25 MG suppository; Place 1 suppository (25 mg total) rectally 2 (two) times daily.  Dispense: 12 suppository; Refill: 0  7. Upset stomach Use sparingly while nursing - Omeprazole 20 MG TBEC; Take 1 tablet (20 mg total) by mouth daily.  Dispense: 30 tablet;  Refill: 3  8. Generalized anxiety disorder contineu - sertraline (ZOLOFT) 50 MG tablet; Take 1 tablet (50 mg total) by mouth daily.  Dispense: 30 tablet; Refill: 5  9. Sciatic pain, right -only for use after she finishes breast-feeding in 1 month(note to pharmacy on Rx.   - methocarbamol (ROBAXIN) 500 MG tablet; Take 2 tablets (1,000 mg total) by mouth every 8 (eight) hours as needed for muscle spasms.  Dispense: 90 tablet; Refill: 1  Spent >40 mins face to face on all the above   Patient have been counseled extensively about nutrition and exercise  Return in about 3 months (around 11/12/2020) for PCP for check up.  The patient was given clear instructions to go to ER or return to medical center if symptoms don't improve, worsen or new problems develop. The patient verbalized understanding. The patient was told to call to get lab results if they haven't heard anything in the next week.  Georgian Co, PA-C [facility] and [facility] [place], [place] [phone]   08/12/2020, 11:03 AM  .

## 2020-08-12 NOTE — Patient Instructions (Addendum)
Drink 100 ounces water daily  Also address these concerns with your gynecologist June 1.    Go to the emergency department if any of your symptoms worsen, calf pain/redness or swelling develop, sever Headache, chest pain or shortness of breath

## 2020-08-13 LAB — COMPREHENSIVE METABOLIC PANEL
ALT: 43 IU/L — ABNORMAL HIGH (ref 0–32)
AST: 20 IU/L (ref 0–40)
Albumin/Globulin Ratio: 1.8 (ref 1.2–2.2)
Albumin: 4.8 g/dL (ref 3.8–4.8)
Alkaline Phosphatase: 67 IU/L (ref 44–121)
BUN/Creatinine Ratio: 19 (ref 9–23)
BUN: 10 mg/dL (ref 6–20)
Bilirubin Total: 0.3 mg/dL (ref 0.0–1.2)
CO2: 23 mmol/L (ref 20–29)
Calcium: 10 mg/dL (ref 8.7–10.2)
Chloride: 98 mmol/L (ref 96–106)
Creatinine, Ser: 0.54 mg/dL — ABNORMAL LOW (ref 0.57–1.00)
Globulin, Total: 2.7 g/dL (ref 1.5–4.5)
Glucose: 85 mg/dL (ref 65–99)
Potassium: 4.2 mmol/L (ref 3.5–5.2)
Sodium: 137 mmol/L (ref 134–144)
Total Protein: 7.5 g/dL (ref 6.0–8.5)
eGFR: 121 mL/min/{1.73_m2} (ref >59)

## 2020-08-13 LAB — CBC WITH DIFFERENTIAL/PLATELET
Basophils Absolute: 0 10*3/uL (ref 0.0–0.2)
Basos: 1 %
EOS (ABSOLUTE): 0.1 10*3/uL (ref 0.0–0.4)
Eos: 1 %
Hematocrit: 39.4 % (ref 34.0–46.6)
Hemoglobin: 12.9 g/dL (ref 11.1–15.9)
Immature Grans (Abs): 0 10*3/uL (ref 0.0–0.1)
Immature Granulocytes: 0 %
Lymphocytes Absolute: 1.7 10*3/uL (ref 0.7–3.1)
Lymphs: 26 %
MCH: 27.7 pg (ref 26.6–33.0)
MCHC: 32.7 g/dL (ref 31.5–35.7)
MCV: 85 fL (ref 79–97)
Monocytes Absolute: 0.5 10*3/uL (ref 0.1–0.9)
Monocytes: 7 %
Neutrophils Absolute: 4.1 10*3/uL (ref 1.4–7.0)
Neutrophils: 65 %
Platelets: 303 10*3/uL (ref 150–450)
RBC: 4.65 x10E6/uL (ref 3.77–5.28)
RDW: 12.5 % (ref 11.7–15.4)
WBC: 6.4 10*3/uL (ref 3.4–10.8)

## 2020-08-13 LAB — THYROID PANEL WITH TSH
Free Thyroxine Index: 1.6 (ref 1.2–4.9)
T3 Uptake Ratio: 24 % (ref 24–39)
T4, Total: 6.8 ug/dL (ref 4.5–12.0)
TSH: 1.09 u[IU]/mL (ref 0.450–4.500)

## 2020-08-13 LAB — VITAMIN D 25 HYDROXY (VIT D DEFICIENCY, FRACTURES): Vit D, 25-Hydroxy: 29.5 ng/mL — ABNORMAL LOW (ref 30.0–100.0)

## 2020-08-25 ENCOUNTER — Telehealth: Payer: Self-pay | Admitting: Nurse Practitioner

## 2020-08-25 NOTE — Telephone Encounter (Signed)
Pt was sent a letter from financial dept. Inform them, that the application they submitted was incomplete, since they were missing some documentation at the time of the appointment, Pt need to reschedule and resubmit all new papers and application for CAFA and OC, P.S. old documents has been sent back by mail to the Pt and Pt. need to make a new appt. 

## 2020-09-08 ENCOUNTER — Other Ambulatory Visit: Payer: Self-pay

## 2020-09-08 ENCOUNTER — Ambulatory Visit: Payer: Self-pay | Attending: Nurse Practitioner

## 2020-10-08 ENCOUNTER — Other Ambulatory Visit: Payer: Self-pay

## 2020-10-08 ENCOUNTER — Ambulatory Visit: Payer: Self-pay | Attending: Physician Assistant | Admitting: Physician Assistant

## 2020-10-08 VITALS — BP 125/84 | HR 76 | Resp 16 | Wt 170.8 lb

## 2020-10-08 DIAGNOSIS — K649 Unspecified hemorrhoids: Secondary | ICD-10-CM

## 2020-10-08 DIAGNOSIS — R109 Unspecified abdominal pain: Secondary | ICD-10-CM

## 2020-10-08 DIAGNOSIS — Z789 Other specified health status: Secondary | ICD-10-CM

## 2020-10-08 MED ORDER — SIMETHICONE 125 MG PO CAPS
1.0000 | ORAL_CAPSULE | Freq: Three times a day (TID) | ORAL | 1 refills | Status: DC | PRN
  Filled 2020-10-08: qty 90, 30d supply, fill #0

## 2020-10-08 MED ORDER — HYDROCORTISONE ACETATE 25 MG RE SUPP
25.0000 mg | Freq: Two times a day (BID) | RECTAL | 0 refills | Status: DC
  Filled 2020-10-08: qty 12, 6d supply, fill #0

## 2020-10-08 NOTE — Progress Notes (Signed)
Patient ID: Tracie [name], female   DOB: 1980/09/16, 40 y.o.   MRN: [id number] "[address]    Tracie [name], is a 40 y.o. female  [id number]  [id number]  DOB - [birth year]  Subjective:  Chief Complaint and HPI: Tracie [name] is a 40 y.o. female here today for recurrent hemorrhoids.  She says now she is not having any blood with wiping, but feels a bulge just before and after a BM that retracts back in after BM.  No pain with defecation.  Labs in may unremarkable.  H. Pylori neg ~1 year ago.  Some nausea.  No vomiting.  She does have abdominal cramping and a lot of gas.  No acute abdominal pain. No urinary s/sx.  She is currently nursing her baby 3-4 times daily.  Taking simethicone irregularly and taking probiotics without relief.   ROS:   Constitutional:  No f/c, No night sweats, No unexplained weight loss. EENT:  No vision changes, No blurry vision, No hearing changes. No mouth, throat, or ear problems.  Respiratory: No cough, No SOB Cardiac: No CP, no palpitations GI:  see above GU: No Urinary s/sx Musculoskeletal: No joint pain Neuro: No headache, no dizziness, no motor weakness.  Skin: No rash Endocrine:  No polydipsia. No polyuria.  Psych: Denies SI/HI  No problems updated.  ALLERGIES: No Known Allergies  PAST MEDICAL HISTORY: Past Medical History:  Diagnosis Date   Anxiety    Depression    Headache     MEDICATIONS AT HOME: Prior to Admission medications   Medication Sig Start Date End Date Taking? Authorizing Provider  Simethicone 125 MG CAPS Take 1 capsule (125 mg total) by mouth 3 (three) times daily as needed. 10/08/20  Yes Anders Simmonds, PA-C  acetaminophen (TYLENOL) 325 MG tablet Take 2 tablets (650 mg total) by mouth every 6 (six) hours as needed. Patient not taking: Reported on 07/21/2020 06/15/20 06/15/21  Sheila Oats, MD  cefaclor (CECLOR) 500 MG capsule TAKE 1 CAPSULE (500 MG TOTAL) BY MOUTH 2 (TWO) TIMES DAILY. Patient not  taking: Reported on 07/21/2020 06/17/20 06/17/21  Tereso Newcomer, MD  cefadroxil (DURICEF) 500 MG capsule Take 1 capsule (500 mg total) by mouth 2 (two) times daily. Patient not taking: Reported on 10/08/2020 06/17/20   Tereso Newcomer, MD  coconut oil OIL Apply 1 application topically as needed (nipple pain). Patient not taking: Reported on 07/21/2020 06/15/20   Sheila Oats, MD  hydrocortisone (ANUSOL-HC) 25 MG suppository Place 1 suppository (25 mg total) rectally 2 (two) times daily. 10/08/20   Anders Simmonds, PA-C  hydrOXYzine (ATARAX/VISTARIL) 25 MG tablet Take 1 tablet (25 mg total) by mouth 3 (three) times daily as needed. 06/23/20   Anyanwu, Jethro Bastos, MD  ibuprofen (ADVIL) 600 MG tablet TAKE 1 TABLET (600 MG TOTAL) BY MOUTH EVERY 8 (EIGHT) HOURS AS NEEDED. 06/15/20 06/15/21  Sheila Oats, MD  methocarbamol (ROBAXIN) 500 MG tablet Take 2 tablets (1,000 mg total) by mouth every 8 (eight) hours as needed for muscle spasms. 08/12/20   Anders Simmonds, PA-C  norethindrone (MICRONOR) 0.35 MG tablet TAKE 1 TABLET (0.35 MG TOTAL) BY MOUTH DAILY. 06/15/20 06/15/21  Sheila Oats, MD  omeprazole (PRILOSEC) 20 MG capsule Take 1 capsule (20 mg total) by mouth daily. 08/12/20   Anders Simmonds, PA-C  Prenatal Vit-Fe Fumarate-FA (MULTIVITAMIN-PRENATAL) 27-0.8 MG TABS tablet Take 1 tablet by mouth daily at 12 noon.    [provider]  sertraline (ZOLOFT) 50 MG  tablet Take 1 tablet (50 mg total) by mouth daily. 08/12/20   Anders Simmonds, PA-C     Objective:  EXAM:   Vitals:   10/08/20 1024  BP: 125/84  Pulse: 76  Resp: 16  SpO2: 98%  Weight: 170 lb 12.8 oz (77.5 kg)    General appearance : A&OX3. NAD. Non-toxic-appearing HEENT: Atraumatic and Normocephalic.  PERRLA. EOM intact.   Chest/Lungs:  Breathing-non-labored, Good air entry bilaterally, breath sounds normal without rales, rhonchi, or wheezing  CVS: S1 S2 regular, no murmurs, gallops, rubs  Abdomen: Bowel sounds present,  Non tender and not distended with no gaurding, rigidity or rebound. Extremities: Bilateral Lower Ext shows no edema, both legs are warm to touch with = pulse throughout Neurology:  CN II-XII grossly intact, Non focal.   Psych:  TP linear. J/I WNL. Normal speech. Appropriate eye contact and affect.  Skin:  No Rash  Data Review Lab Results  Component Value Date   HGBA1C 5.5 12/18/2019   HGBA1C 5.6 10/23/2015   HGBA1C 5.5 09/09/2013     Assessment & Plan   1. Hemorrhoids, unspecified hemorrhoid type Sounds like reducible internal hemorrhoids Do not resolve/only improve with current measures.  Water.  Fiber.  - hydrocortisone (ANUSOL-HC) 25 MG suppository; Place 1 suppository (25 mg total) rectally 2 (two) times daily.  Dispense: 12 suppository; Refill: 0 - Ambulatory referral to Gastroenterology  2. Stomach pain-non acute abdomen Recent labs essentially normal and h. Pylori neg about 1 year ago Gas cramping-take regularly scheduled simethicone.  She is breastfeeding so can't try Bentyl - Ambulatory referral to Gastroenterology - Simethicone 125 MG CAPS; Take 1 capsule (125 mg total) by mouth 3 (three) times daily as needed.  Dispense: 90 capsule; Refill: 1  3. Language barrier AMN interpreters "[place]" used and additional time performing visit was required.   Patient have been counseled extensively about nutrition and exercise  Return for keep next scheduled appt.  The patient was given clear instructions to go to ER or return to medical center if symptoms don't improve, worsen or new problems develop. The patient verbalized understanding. The patient was told to call to get lab results if they haven't heard anything in the next week.     Georgian Co, PA-C [facility] and [facility] [place], [place] [phone]   10/08/2020, 11:15 AM

## 2020-10-08 NOTE — Patient Instructions (Addendum)
Drink 80-100 ounces water daily Hemorroides Hemorrhoids Las hemorroides son venas inflamadas que pueden desarrollarse: En el ano (recto). Estas se denominan hemorroides internas. Alrededor de la abertura del ano. Estas se denominan hemorroides externas. Las hemorroides pueden causar dolor, picazn o hemorragias. Generalmente no causan problemas graves. Con frecuencia mejoran al [employer] dieta, el estilode vida y otros tratamientos Facilities manager. Cules son las causas? Esta afeccin puede ser causada por lo siguiente: Tener dificultad para defecar (estreimiento). Hacer mucha fuerza (esfuerzo) para defecar. Materia fecal lquida (diarrea). Embarazo. Tener mucho sobrepeso (obesidad). Estar sentado durante largos perodos de [place]. [place] objetos pesados u otras actividades que impliquen esfuerzo. Sexo anal. Andar en bicicleta por un largo perodo de tiempo. Cules son los signos o los sntomas? Los sntomas de esta afeccin incluyen los siguientes: Engineer, mining. Picazn o irritacin en el ano. Sangrado proveniente del ano. Prdida de materia fecal. Inflamacin en la zona. Uno o ms bultos alrededor de la abertura del ano. Cmo se diagnostica? A menudo un mdico puede diagnosticar esta afeccin al observar la zona afectada. El mdico tambin puede: Education officer, environmental un examen que implica palpar la zona con la mano enguantada (examen rectal digital). Examinar el interior de la zona anal utilizando un pequeo tubo (anoscopio). Pedir anlisis de [place]. Es posible que esto se realice si ha perdido Warden/ranger. Solicitarle un estudio que consiste en la observacin del interior del colon utilizando un tubo flexible con una cmara en el extremo (sigmoidoscopia o colonoscopa). Cmo se trata? Esta afeccin generalmente se puede tratar en el hogar. El mdico puede indicarle que cambie de Paediatric nurse, de estilo de vida o que trate de Psychologist, sport and exercise. Si esto no da resultado, se pueden  realizar procedimientos para extirpar las hemorroides o reducir Brewing technologist. Estos pueden implicar lo siguiente: Museum/gallery conservator en la base de las hemorroides para interrumpir la irrigacin de Risk manager. Inyectar un medicamento en las hemorroides para reducir Brewing technologist. Dirigir un tipo de Engineer, drilling de luz hacia las hemorroides para Radio producer que se caigan. Realizar Neomia Dear ciruga para extirpar las hemorroides o cortar la irrigacin de [place]. Siga estas indicaciones en su casa: Comida y bebida  Consuma alimentos con alto contenido de [place]. Entre ellos cereales integrales, frijoles, frutos secos, frutas y verduras. Pregntele a su mdico acerca de tomar productos con fibra aadida en ellos (complementos de fibra). Disminuya la cantidad de grasa de la dieta. Para esto, puede hacer lo siguiente: Coma productos lcteos descremados. Coma menos carne roja. No consuma alimentos procesados. Beba suficiente lquido para Photographer orina de color amarillo plido.  Control del dolor y la hinchazn  Tome un bao de agua tibia (bao de asiento) durante 20 minutos para Engineer, materials. Hgalo 3 o 4 veces al da. Puede hacer esto en una baera o usar un dispositivo porttil para bao de asiento que se coloca sobre el inodoro. Si se lo indican, aplique hielo sobre la zona dolorida. Puede ser beneficioso aplicar hielo [employer] baos con agua tibia. Ponga el hielo en una bolsa plstica. Coloque una toalla entre la piel y Copy. Coloque el hielo durante 20 minutos, 2 a 3 veces por da.  Indicaciones generales [employer] de venta libre y los recetados solamente como se lo haya indicado el mdico. Las cremas medicinales y los medicamentos pueden usarse como se lo hayan indicado. Haga ejercicio fsico con frecuencia. Consulte al mdico qu tipos de ejercicios son mejores para usted y qu cantidad. Vaya al Luanne Bras  cuando sienta ganas de defecar. No espere. Evite hacer demasiada fuerza al  defecar. Mantenga el ano seco y limpio. Use papel higinico hmedo o toallitas humedecidas despus de defecar. No pase mucho tiempo sentado en el inodoro. Concurra a todas las visitas de [address] se lo haya indicado el mdico. Esto es importante. Comunquese con un mdico si: Tiene dolor e hinchazn que no mejoran con el tratamiento o los medicamentos. Tiene problemas para defecar. No puede defecar. Tiene dolor o hinchazn en la zona exterior de las hemorroides. Solicite ayuda inmediatamente si tiene: Hemorragia que no se detiene. Resumen Las hemorroides son venas hinchadas en el ano o la zona que rodea el ano. Pueden causar dolor, picazn o sangrado. Consuma alimentos con alto contenido de [place]. Entre ellos cereales integrales, frijoles, frutos secos, frutas y verduras. Tome un bao de agua tibia (bao de asiento) durante 20 minutos para Engineer, materials. Hgalo 3 o 4 veces al da. Esta informacin no tiene Theme park manager el consejo del mdico. Asegresede hacerle al mdico cualquier pregunta que tenga. Document Revised: 09/14/2017 Document Reviewed: 09/14/2017 Elsevier Patient Education  2022 [employer].

## 2020-10-26 ENCOUNTER — Ambulatory Visit: Payer: Self-pay | Admitting: Nurse Practitioner

## 2020-10-29 ENCOUNTER — Other Ambulatory Visit: Payer: Self-pay

## 2020-10-29 ENCOUNTER — Ambulatory Visit (INDEPENDENT_AMBULATORY_CARE_PROVIDER_SITE_OTHER): Payer: Self-pay | Admitting: Gastroenterology

## 2020-10-29 ENCOUNTER — Encounter: Payer: Self-pay | Admitting: Gastroenterology

## 2020-10-29 VITALS — BP 112/70 | HR 80 | Ht 63.0 in | Wt 167.4 lb

## 2020-10-29 DIAGNOSIS — K529 Noninfective gastroenteritis and colitis, unspecified: Secondary | ICD-10-CM

## 2020-10-29 DIAGNOSIS — R140 Abdominal distension (gaseous): Secondary | ICD-10-CM

## 2020-10-29 DIAGNOSIS — R1084 Generalized abdominal pain: Secondary | ICD-10-CM

## 2020-10-29 MED ORDER — DICYCLOMINE HCL 10 MG PO CAPS
10.0000 mg | ORAL_CAPSULE | Freq: Two times a day (BID) | ORAL | 0 refills | Status: DC
  Filled 2020-10-29 – 2020-11-17 (×2): qty 60, 30d supply, fill #0

## 2020-10-29 NOTE — Patient Instructions (Addendum)
If you are age 38 or older, your body mass index should be between 23-30. Your Body mass index is 29.65 kg/m. If this is out of the aforementioned range listed, please consider follow up with your Primary Care Provider.  If you are age 2 or younger, your body mass index should be between 19-25. Your Body mass index is 29.65 kg/m. If this is out of the aformentioned range listed, please consider follow up with your Primary Care Provider.   __________________________________________________________  The [place] providers would like to encourage you to use [facility] to communicate with providers for non-urgent requests or questions.  Due to long hold times on the telephone, sending your provider a message by [facility] may be a faster and more efficient way to get a response.  Please allow 48 business hours for a response.  Please remember that this is for non-urgent requests.   It has been recommended to you by your physician that you have a(n) Colonoscopy completed. Per your request, we did not schedule the procedure(s) today. Please contact our office at [phone] should you decide to have the procedure completed. You will be scheduled for a pre-visit and procedure at that time.   Food Guidelines for those with chronic digestive trouble:  Many people have difficulty digesting certain foods, causing a variety of distressing and embarrassing symptoms such as abdominal pain, bloating and gas.  These foods may need to be avoided or consumed in small amounts.  Here are some tips that might be helpful for you.  1.   Lactose intolerance is the difficulty or complete inability to digest lactose, the natural sugar in milk and anything made from milk.  This condition is harmless, common, and can begin any time during life.  Some people can digest a modest amount of lactose while others cannot tolerate any.  Also, not all dairy products contain equal amounts of lactose.  For example, hard cheeses such as  parmesan have less lactose than soft cheeses such as cheddar.  Yogurt has less lactose than milk or cheese.  Many packaged foods (even many brands of bread) have milk, so read ingredient lists carefully.  It is difficult to test for lactose intolerance, so just try avoiding lactose as much as possible for a week and see what happens with your symptoms.  If you seem to be lactose intolerant, the best plan is to avoid it (but make sure you get calcium from another source).  The next best thing is to use lactase enzyme supplements, available over the counter everywhere.  Just know that many lactose intolerant people need to take several tablets with each serving of dairy to avoid symptoms.  Lastly, a lot of restaurant food is made with milk or butter.  Many are things you might not suspect, such as mashed potatoes, rice and pasta (cooked with butter) and "grilled" items.  If you are lactose intolerant, it never hurts to ask your server what has milk or butter.  2.   Fiber is an important part of your diet, but not all fiber is well-tolerated.  Insoluble fiber such as bran is often consumed by normal gut bacteria and converted into gas.  Soluble fiber such as oats, squash, carrots and green beans are typically tolerated better.  3.   Some types of carbohydrates can be poorly digested.  Examples include: fructose (apples, cherries, pears, raisins and other dried fruits), fructans (onions, zucchini, large amounts of wheat), sorbitol/mannitol/xylitol and sucralose/Splenda (common artificial sweeteners), and raffinose (lentils,  broccoli, cabbage, asparagus, brussel sprouts, many types of beans).  Do a Programmer, multimedia for [place] and you will find helpful information. Beano, a dietary supplement, will often help with raffinose-containing foods.  As with lactase tablets, you may need several per serving.  4.   Whenever possible, avoid processed food&meats and chemical additives.  High fructose corn syrup, a common  sweetener, may be difficult to digest.  Eggs and soy (comes from the soybean, and added to many foods now) are other common bloating/gassy foods.  5.  Regarding gluten:  gluten is a protein mainly found in wheat, but also rye and barley.  There is a condition called celiac sprue, which is an inflammatory reaction in the small intestine causing a variety of digestive symptoms.  Blood testing is highly reliable to look for this condition, and sometimes upper endoscopy with small bowel biopsies may be necessary to make the diagnosis.  Many patients who test negative for celiac sprue report improvement in their digestive symptoms when they switch to a gluten-free diet.  However, in these "non-celiac gluten sensitive" patients, the true role of gluten in their symptoms is unclear.  Reducing carbohydrates in general may decrease the gas and bloating caused when gut bacteria consume carbs. Also, some of these patients may actually be intolerant of the baker's yeast in bread products rather than the gluten.  Flatbread and other reduced yeast breads might therefore be tolerated.  There is no specific testing available for most food intolerances, which are discovered mainly by dietary elimination.  Please do not embark on a gluten free diet unless directed by your doctor, as it is highly restrictive, and may lead to nutritional deficiencies if not carefully monitored.  Lastly, beware of internet claims offering "personalized" tests for food intolerances.  Such testing has no reliable scientific evidence to support its reliability and correlation to symptoms.    6.  The best advice is old advice, especially for those with chronic digestive trouble - try to eat "clean".  Balanced diet, avoid processed food, plenty of fruits and vegetables, cut down the sugar, minimal alcohol, avoid tobacco. Make time to care for yourself, get enough sleep, exercise when you can, reduce stress.  Your guts will thank you for it.   - Dr.  [facility]   It was a pleasure to see you today!  Thank you for trusting me with your gastrointestinal care!

## 2020-10-29 NOTE — Progress Notes (Signed)
[facility] Consult Note:  History: Tracie [name] 10/29/2020  Referring provider: Claiborne Rigg, NP  Reason for consult/chief complaint: Abdominal Pain (Has lower abdominal pain at times can be specifically left or right sided. Has cramping and "contracting" pains. Cramping pain occurs frequently. Pain is associated with feeling of needing to pass gas. Does have alternating constipation and diarrhea. No blood noted in stool. Has used mint tea and pepto bismol but these do not help.), Chest Pain (Occurs frequently; pressure sensation; feels this could be related to anxiety. Does have some nausea at times (NOT) associated with chest pain. No vomiting.), and rectal protrusion (Notes that she has rectal protrusion at times with bearing down. Does have some sharp rectal pain with hard stool.)   Subjective  HPI:  This is a very pleasant 40 year old woman referred by primary care and seen with a Spanish interpreter Tracie [name]) for abdominal pain and altered bowel habits. Since delivery of her second child in late March, she has had chronic abdominal crampy pain, fairly generalized but sometimes more on the left side.  It is associated with some intermittent loose stool, but other times feels that she needs to have a bowel movement and cannot go.  She might sit for a long period of time and then strain and sometimes feel a protrusion that she thinks may be hemorrhoids there is been no bleeding, he does have some intermittent nausea but no vomiting.  She had talked to primary care about some intermittent nonexertional chest pressure, and reportedly describes some anxiety as both predating the pregnancy and somewhat worse afterwards.  Denies dysphagia or odynophagia. Tracie [name] is also bothered by bloating and feeling of gas and distention that she cannot always pass.  ROS:  Review of Systems  Constitutional:  Negative for appetite change and unexpected weight change.  HENT:   Negative for mouth sores and voice change.   Eyes:  Negative for pain and redness.  Respiratory:  Negative for cough and shortness of breath.   Cardiovascular:  Negative for chest pain and palpitations.  Genitourinary:  Negative for dysuria and hematuria.  Musculoskeletal:  Negative for arthralgias and myalgias.  Skin:  Negative for pallor and rash.  Neurological:  Negative for weakness and headaches.  Hematological:  Negative for adenopathy.    Past Medical History: Past Medical History:  Diagnosis Date   Anxiety    Depression    Headache      Past Surgical History: Past Surgical History:  Procedure Laterality Date   NO PAST SURGERIES     SVD full term in late March   Family History: Family History  Problem Relation Age of Onset   Hypertension Neg Hx    Colon cancer Neg Hx    Esophageal cancer Neg Hx    Pancreatic cancer Neg Hx    Stomach cancer Neg Hx    Liver disease Neg Hx     Social History: Social History   Socioeconomic History   Marital status: Single    Spouse name: Not on file   Number of children: Not on file   Years of education: Not on file   Highest education level: Not on file  Occupational History   Not on file  Tobacco Use   Smoking status: Never   Smokeless tobacco: Never  Vaping Use   Vaping Use: Never used  Substance and Sexual Activity   Alcohol use: No   Drug use: No   Sexual activity: Yes    Birth control/protection:  Condom  Other Topics Concern   Not on file  Social History Narrative   Not on file   Social Determinants of Health   Financial Resource Strain: Not on file  Food Insecurity: Not on file  Transportation Needs: Not on file  Physical Activity: Not on file  Stress: Not on file  Social Connections: Not on file    Allergies: No Known Allergies  Outpatient Meds: Current Outpatient Medications  Medication Sig Dispense Refill   acetaminophen (TYLENOL) 325 MG tablet Take 2 tablets (650 mg total) by mouth every 6  (six) hours as needed. 100 tablet 2   hydrOXYzine (ATARAX/VISTARIL) 25 MG tablet Take 1 tablet (25 mg total) by mouth 3 (three) times daily as needed. 30 tablet 2   ibuprofen (ADVIL) 600 MG tablet TAKE 1 TABLET (600 MG TOTAL) BY MOUTH EVERY 8 (EIGHT) HOURS AS NEEDED. 30 tablet 0   omeprazole (PRILOSEC) 20 MG capsule Take 1 capsule (20 mg total) by mouth daily. (Patient taking differently: Take 20 mg by mouth daily as needed.) 30 capsule 3   Simethicone 125 MG CAPS Take 1 capsule (125 mg total) by mouth 3 (three) times daily as needed. 90 capsule 1   No current facility-administered medications for this visit.      ___________________________________________________________________ Objective   Exam:  BP 112/70   Pulse 80   Ht 5\' 3"  (1.6 m)   Wt 167 lb 6.4 oz (75.9 kg)   BMI 29.65 kg/m  Wt Readings from Last 3 Encounters:  10/29/20 167 lb 6.4 oz (75.9 kg)  10/08/20 170 lb 12.8 oz (77.5 kg)  08/12/20 176 lb 6.4 oz (80 kg)   Exam chaperoned by female interpreter and 08/14/20, CMA General: Well-appearing Eyes: sclera anicteric, no redness ENT: oral mucosa moist without lesions, no cervical or supraclavicular lymphadenopathy CV: RRR without murmur, S1/S2, no JVD, no peripheral edema Resp: clear to auscultation bilaterally, normal RR and effort noted GI: soft, no focal tenderness, with active bowel sounds. No guarding or palpable organomegaly noted. Skin; warm and dry, no rash or jaundice noted Neuro: awake, alert and oriented x 3. Normal gross motor function and fluent speech Rectal, normal perianal exam.  Normal resting and voluntary sphincter tone, no fissure, tenderness or palpable internal lesion.  Normal rectal descent when bearing down Labs:  CBC Latest Ref Rng & Units 08/12/2020 06/14/2020 06/14/2020  WBC 3.4 - 10.8 x10E3/uL 6.4 18.2(H) 11.2(H)  Hemoglobin 11.1 - 15.9 g/dL 06/16/2020 11.4(L) 11.9(L)  Hematocrit 34.0 - 46.6 % 39.4 35.0(L) 36.2  Platelets 150 - 450 x10E3/uL 303 220  240   CMP Latest Ref Rng & Units 08/12/2020 06/14/2020 08/16/2019  Glucose 65 - 99 mg/dL 85 08/18/2019) 982(M)  BUN 6 - 20 mg/dL 10 415(A) 11  Creatinine 0.57 - 1.00 mg/dL <3(E) 9.40(H 6.80)  Sodium 134 - 144 mmol/L 137 135 140  Potassium 3.5 - 5.2 mmol/L 4.2 3.5 3.9  Chloride 96 - 106 mmol/L 98 109 105  CO2 20 - 29 mmol/L 23 16(L) 22  Calcium 8.7 - 10.2 mg/dL 8.81(J 03.1) 9.3  Total Protein 6.0 - 8.5 g/dL 7.5 5.9(L) -  Total Bilirubin 0.0 - 1.2 mg/dL 0.3 0.4 -  Alkaline Phos 44 - 121 IU/L 67 112 -  AST 0 - 40 IU/L 20 21 -  ALT 0 - 32 IU/L 43(H) 13 -    Assessment: Encounter Diagnoses  Name Primary?   Generalized abdominal pain Yes   Chronic diarrhea    Abdominal bloating  Chronic digestive symptoms for several months since childbirth.  Looks well, recent lab work reassuring.  Seems unlikely to be infection. Overall, seems most consistent with IBS and probably some post pregnancy pelvic floor/rectal motility changes.  Feelings of protrusion may very well be hemorrhoids from pregnancy and delivery. Less likely new onset IBD.  Plan:  Trial of dicyclomine 10 milligrams twice daily before meals She is currently transitioning from breast-feeding to bottle, and believes she will be finished breast-feeding about 3 weeks.  I made it clear she cannot take this medicine while breast-feeding, she understood, and said she would like the prescription sent but she will not start taking it until completely transitioning to bottlefeeding.  She was offered a colonoscopy.  Procedure was described along with the approximate costs, but she does not feel she could do that right now because she is uninsured.  She was given the option of follow-up clinic visit in 4 to 6 weeks or to call as needed for further advice.  If she is not improved on medicine or if she develops worsening symptoms and certainly if there is bleeding, then colonoscopy would be warranted.  Thank you for the courtesy of this consult.   Please call me with any questions or concerns.  Charlie Pitter III  CC: Referring provider noted above

## 2020-11-05 ENCOUNTER — Other Ambulatory Visit: Payer: Self-pay

## 2020-11-16 ENCOUNTER — Other Ambulatory Visit: Payer: Self-pay

## 2020-11-17 ENCOUNTER — Other Ambulatory Visit: Payer: Self-pay

## 2020-12-24 ENCOUNTER — Other Ambulatory Visit: Payer: Self-pay

## 2021-01-19 ENCOUNTER — Ambulatory Visit: Payer: Self-pay | Admitting: Physician Assistant

## 2021-02-02 ENCOUNTER — Encounter: Payer: Self-pay | Admitting: Nurse Practitioner

## 2021-02-02 ENCOUNTER — Ambulatory Visit (INDEPENDENT_AMBULATORY_CARE_PROVIDER_SITE_OTHER): Payer: Self-pay | Admitting: Nurse Practitioner

## 2021-02-02 ENCOUNTER — Other Ambulatory Visit: Payer: Self-pay

## 2021-02-02 VITALS — BP 140/80 | HR 106 | Ht 63.0 in | Wt 164.6 lb

## 2021-02-02 DIAGNOSIS — K529 Noninfective gastroenteritis and colitis, unspecified: Secondary | ICD-10-CM

## 2021-02-02 DIAGNOSIS — R1084 Generalized abdominal pain: Secondary | ICD-10-CM

## 2021-02-02 MED ORDER — HYOSCYAMINE SULFATE 0.125 MG SL SUBL
0.1250 mg | SUBLINGUAL_TABLET | SUBLINGUAL | 0 refills | Status: DC | PRN
  Filled 2021-02-02: qty 30, 5d supply, fill #0

## 2021-02-02 NOTE — Progress Notes (Signed)
____________________________________________________________  Attending physician addendum:  Thank you for sending this case to me. I have reviewed the entire note and agree with the plan.   Monet [place] Dan[place], MD  ____________________________________________________________  

## 2021-02-02 NOTE — Progress Notes (Signed)
02/02/2021 Tacey Dimaggio [id number] 11-12-1980   Chief Complaint: Abdominal pain, diarrhea   History of Present Illness: Tracie [name] is a 40 year old female who was initially evaluated in our office by Dr. Loletha Carrow on 10/29/2020 regarding generalized abdominal cramping pain and intermittent loose stools since she delivered her second child 05/2020.  A colonoscopy was discussed at that visit, however, she did not wish to pursue a colonoscopy at that time as she was still breast-feeding her infant son and she was uninsured.  She was prescribed Dicyclomine which resulted in feeling flush so she discontinued it.  She presents today for further follow-up.  She is accompanied by a [place] Spanish interpreter who is present to facilitate communication throughout today's office visit.  She continues to have central abdominal pain most days which occurs after eating with intermittent diarrhea.  No specific food triggers. She describes passing nonbloody loose diarrhea 2-3 episodes for one day then the next 2 to 3 days she passes a softly formed stool then the cycle repeats. She infrequently passes nonbloody mucous per the rectum. She sometimes has the urge to pass a BM, sits on the commode but does not pass a BM. She has hemorrhoidal irritation and possible swelling at times. She remains uninsured but she wishes to schedule a colonoscopy at this point.  She is no longer breast-feeding her son. She has some nausea without vomiting.  She has heartburn only if she eats spicy foods.  No dysphagia.  She takes Aleve 2 tabs once weekly for back pain.  No alcohol or drug use.  She infrequently drinks caffeinated sodas, no coffee.  She drinks 1 glass of 2% milk daily.  Laboratory studies 08/12/2020: Sodium 137.  Potassium 4.2.  BUN 10.  Creatinine 0.54.  Alk phos 67.  Total bili 0.3.  AST 20.  ALT 43.  WBC 6.4.  Hemoglobin 12.9.  Hematocrit 39.4.  Platelet 303.  Current Outpatient Medications on File  Prior to Visit  Medication Sig Dispense Refill   hydrOXYzine (ATARAX/VISTARIL) 25 MG tablet Take 1 tablet (25 mg total) by mouth 3 (three) times daily as needed. 30 tablet 2   omeprazole (PRILOSEC) 20 MG capsule Take 1 capsule (20 mg total) by mouth daily. (Patient taking differently: Take 20 mg by mouth daily as needed.) 30 capsule 3   No current facility-administered medications on file prior to visit.   Current Medications, Allergies, Past Medical History, Past Surgical History, Family History and Social History were reviewed in [employer] record.  Review of Systems:   Constitutional: Negative for fever, sweats, chills or weight loss.  Respiratory: Negative for shortness of breath.   Cardiovascular: Negative for chest pain, palpitations and leg swelling.  Gastrointestinal: See HPI.  Musculoskeletal: Negative for back pain or muscle aches.  Neurological: Negative for dizziness, headaches or paresthesias.    Physical Exam: BP 140/80   Pulse (!) 106   Ht $R'5\' 3"'QO$  (1.6 m)   Wt 164 lb 9.6 oz (74.7 kg)   BMI 29.16 kg/m  LMP 01/22/2021.  General: 40 year old female in no acute distress. Head: Normocephalic and atraumatic. Eyes: No scleral icterus. Conjunctiva pink . Ears: Normal auditory acuity. Mouth: Dentition intact. No ulcers or lesions.  Lungs: Clear throughout to auscultation. Heart: Regular rate and rhythm, no murmur. Abdomen: Soft, nontender and nondistended. No masses or hepatomegaly. Normal bowel sounds x 4 quadrants.  Rectal: No external hemorrhoids.  Anal hemorrhoids without prolapse or bleeding.  No stool or mass in the  rectal vault. Musculoskeletal: Symmetrical with no gross deformities. Extremities: No edema. Neurological: Alert oriented x 4. No focal deficits.  Psychological: Alert and cooperative. Normal mood and affect  Assessment and Recommendations:  85) 40 year old female with intermittent central abdominal pain and diarhea -Diagnostic  colonoscopy benefits and risks discussed including risk with sedation, risk of bleeding, perforation and infection  -Hyoscyamine 0.125 SL one tab Q 6 to 8 hrs as needed  -Benefiber 1  tablespoon daily if tolerated  -Lactose free mild or use Lactaid with each dairy product  -Contact office if symptoms worsen  -Patient will contact our office if her menstrual cycle is delayed or if there is any potential concern for pregnancy prior to her colonoscopy procedure date -Patient provided with [facility] financial assistance application  2) Intermittent nausea without vomiting. Negative H. Pyilori test 07/22/2019. Mildly elevated ALT level 43.  -Abdominal sonogram to evaluate her liver and gallbladder with repeat LFTs after [place] financial assistance obtained (patiently is currently uninsured).  -Continue Omeprazole 68m po QD for now  3) Anal hemorrhoids with remittent irritation without bleeding -Apply a small amount of Desitin inside the anal opening and to the external anal area tid as needed for anal or hemorrhoidal irritation/bleeding.   Further recommendations to be determined after the above evaluation completed

## 2021-02-02 NOTE — Patient Instructions (Signed)
PROCEDIMIENTOS: Tracie [name] han programado una colonoscopia. Siga las instrucciones escritas que se le dieron en su visita de hoy. Si [place] inhaladores (aunque solo sea necesario), trigalos el da de su procedimiento.  RECOMENDACIONES: Hiosciamina (LEVSIN SL) 0.125 MG SL tableta: Coloque 1 tableta (0.125 mg en total) debajo de la lengua cada 6 a 8 horas segn sea necesario. Esto ha sido enviado a Garment/textile technologist. Desitin: Aplicar una pequea cantidad en la zona anal externa e interna tres veces al da segn sea necesario. Benefiber- 1 cucharada diaria. Tome Lactaid con cada producto lcteo o use leche sin Advice worker.  Fue genial verte hoy! Gracias por confiarme su atencin y elegir [facility].  Arnaldo Natal, CRNP

## 2021-02-03 ENCOUNTER — Encounter: Payer: Self-pay | Admitting: Nurse Practitioner

## 2021-02-03 ENCOUNTER — Other Ambulatory Visit: Payer: Self-pay

## 2021-02-03 ENCOUNTER — Ambulatory Visit (HOSPITAL_BASED_OUTPATIENT_CLINIC_OR_DEPARTMENT_OTHER): Payer: Self-pay | Admitting: Nurse Practitioner

## 2021-02-03 DIAGNOSIS — R1084 Generalized abdominal pain: Secondary | ICD-10-CM

## 2021-02-03 DIAGNOSIS — F419 Anxiety disorder, unspecified: Secondary | ICD-10-CM

## 2021-02-03 MED ORDER — HYDROXYZINE HCL 25 MG PO TABS
25.0000 mg | ORAL_TABLET | Freq: Three times a day (TID) | ORAL | 2 refills | Status: DC | PRN
  Filled 2021-02-03 – 2021-03-11 (×2): qty 60, 20d supply, fill #0

## 2021-02-03 NOTE — Progress Notes (Signed)
Virtual Visit via Telephone Note Due to national recommendations of social distancing due to [place] 19, telehealth visit is felt to be most appropriate for this patient at this time.  I discussed the limitations, risks, security and privacy concerns of performing an evaluation and management service by telephone and the availability of in person appointments. I also discussed with the patient that there may be a patient responsible charge related to this service. The patient expressed understanding and agreed to proceed.    I connected with Tracie [name] on 02/03/21  at   8:10 AM EST  EDT by telephone and verified that I am speaking with the correct person using two identifiers.  Location of Patient: Private Residence   Location of Provider: [place] [facility] [place] participating in Telemedicine visit: Geryl Rankins FNP-BC [place] [phone]   History of Present Illness: Telemedicine visit for: Anxiety   She is requesting a refill of her hydroxyzine 37m today.   She is currently seeing GI for abdominal pain. States her sister was diagnosed with H pylori and she feels she may have the same diagnosis based on similar symptoms of bloating, pain and abdominal distension.    Past Medical History:  Diagnosis Date   Anxiety    Depression    Headache     Past Surgical History:  Procedure Laterality Date   NO PAST SURGERIES      Family History  Problem Relation Age of Onset   Hypertension Neg Hx    Colon cancer Neg Hx    Esophageal cancer Neg Hx    Pancreatic cancer Neg Hx    Stomach cancer Neg Hx    Liver disease Neg Hx     Social History   Socioeconomic History   Marital status: Single    Spouse name: Not on file   Number of children: Not on file   Years of education: Not on file   Highest education level: Not on file  Occupational History   Not on file  Tobacco Use   Smoking status: Never   Smokeless  tobacco: Never  Vaping Use   Vaping Use: Never used  Substance and Sexual Activity   Alcohol use: No   Drug use: No   Sexual activity: Yes    Birth control/protection: Condom  Other Topics Concern   Not on file  Social History Narrative   Not on file   Social Determinants of Health   Financial Resource Strain: Not on file  Food Insecurity: Not on file  Transportation Needs: Not on file  Physical Activity: Not on file  Stress: Not on file  Social Connections: Not on file     Observations/Objective: Awake, alert and oriented x 3   Review of Systems  Constitutional:  Negative for fever, malaise/fatigue and weight loss.  HENT: Negative.  Negative for nosebleeds.   Eyes: Negative.  Negative for blurred vision, double vision and photophobia.  Respiratory: Negative.  Negative for cough and shortness of breath.   Cardiovascular: Negative.  Negative for chest pain, palpitations and leg swelling. Orthopnea: tele. Gastrointestinal:  Positive for abdominal pain, constipation and nausea. Negative for blood in stool, diarrhea, heartburn, melena and vomiting.  Musculoskeletal: Negative.  Negative for myalgias.  Neurological: Negative.  Negative for dizziness, focal weakness, seizures and headaches.  Psychiatric/Behavioral: Negative.  Negative for suicidal ideas.    Assessment and Plan: Diagnoses and all orders for this visit:  Anxiety -  hydrOXYzine (ATARAX/VISTARIL) 25 MG tablet; Take 1 tablet (25 mg total) by mouth 3 (three) times daily as needed for anxiety.  Generalized abdominal pain -     H. pylori breath test -     CMP14+EGFR    Follow Up Instructions Return if symptoms worsen or fail to improve.     I discussed the assessment and treatment plan with the patient. The patient was provided an opportunity to ask questions and all were answered. The patient agreed with the plan and demonstrated an understanding of the instructions.   The patient was advised to call back  or seek an in-person evaluation if the symptoms worsen or if the condition fails to improve as anticipated.  I provided 10 minutes of non-face-to-face time during this encounter including median intraservice time, reviewing previous notes, labs, imaging, medications and explaining diagnosis and management.  Gildardo Pounds, FNP-BC

## 2021-02-04 LAB — CMP14+EGFR
ALT: 14 IU/L (ref 0–32)
AST: 13 IU/L (ref 0–40)
Albumin/Globulin Ratio: 2 (ref 1.2–2.2)
Albumin: 4.9 g/dL — ABNORMAL HIGH (ref 3.8–4.8)
Alkaline Phosphatase: 63 IU/L (ref 44–121)
BUN/Creatinine Ratio: 20 (ref 9–23)
BUN: 12 mg/dL (ref 6–20)
Bilirubin Total: 0.3 mg/dL (ref 0.0–1.2)
CO2: 22 mmol/L (ref 20–29)
Calcium: 9.8 mg/dL (ref 8.7–10.2)
Chloride: 106 mmol/L (ref 96–106)
Creatinine, Ser: 0.61 mg/dL (ref 0.57–1.00)
Globulin, Total: 2.5 g/dL (ref 1.5–4.5)
Glucose: 102 mg/dL — ABNORMAL HIGH (ref 70–99)
Potassium: 4.8 mmol/L (ref 3.5–5.2)
Sodium: 143 mmol/L (ref 134–144)
Total Protein: 7.4 g/dL (ref 6.0–8.5)
eGFR: 117 mL/min/{1.73_m2} (ref >59)

## 2021-02-05 LAB — H. PYLORI BREATH TEST: H pylori Breath Test: NEGATIVE

## 2021-02-10 ENCOUNTER — Other Ambulatory Visit: Payer: Self-pay

## 2021-02-17 ENCOUNTER — Other Ambulatory Visit: Payer: Self-pay

## 2021-02-19 ENCOUNTER — Ambulatory Visit: Payer: Self-pay | Admitting: Nurse Practitioner

## 2021-03-02 ENCOUNTER — Telehealth: Payer: Self-pay | Admitting: Nurse Practitioner

## 2021-03-02 NOTE — Telephone Encounter (Signed)
I return Pt call, schedule a financial appt for 03/11/21

## 2021-03-02 NOTE — Telephone Encounter (Signed)
Copied from CRM [id number]. Topic: General - Other >> Mar 02, 2021  9:41 AM Glean Salen wrote: Reason for [id number] needs appt for financial orange card

## 2021-03-08 ENCOUNTER — Other Ambulatory Visit: Payer: Self-pay

## 2021-03-11 ENCOUNTER — Other Ambulatory Visit: Payer: Self-pay

## 2021-03-11 ENCOUNTER — Ambulatory Visit: Payer: Self-pay | Attending: Nurse Practitioner

## 2021-03-17 ENCOUNTER — Encounter: Payer: Self-pay | Admitting: Nurse Practitioner

## 2021-03-23 ENCOUNTER — Encounter: Payer: Self-pay | Admitting: Nurse Practitioner

## 2021-03-23 ENCOUNTER — Other Ambulatory Visit: Payer: Self-pay

## 2021-03-23 ENCOUNTER — Ambulatory Visit: Payer: Self-pay | Attending: Nurse Practitioner | Admitting: Nurse Practitioner

## 2021-03-23 DIAGNOSIS — G43709 Chronic migraine without aura, not intractable, without status migrainosus: Secondary | ICD-10-CM

## 2021-03-23 MED ORDER — AMITRIPTYLINE HCL 10 MG PO TABS
10.0000 mg | ORAL_TABLET | Freq: Every day | ORAL | 3 refills | Status: DC
  Filled 2021-03-23: qty 30, 30d supply, fill #0

## 2021-03-23 NOTE — Progress Notes (Signed)
Virtual Visit via Telephone Note Due to national recommendations of social distancing due to COVID 19, telehealth visit is felt to be most appropriate for this patient at this time.  I discussed the limitations, risks, security and privacy concerns of performing an evaluation and management service by telephone and the availability of in person appointments. I also discussed with the patient that there may be a patient responsible charge related to this service. The patient expressed understanding and agreed to proceed.    I connected with Tracie [name] on 03/23/21  at   2:30 PM EST  EDT by telephone and verified that I am speaking with the correct person using two identifiers.  Location of Patient: Private Residence   [facility] of Provider[facility]    Persons participating in Telemedicine visit: Bertram Denver FNP-BC Tracie [name]  Spanish Interpreter ID [phone] Tracie [name]   History of Present Illness: Telemedicine visit for: Headaches and Dizziness  Endorses frequent frontal headaches. Associated symptoms: fatigue, dizziness and nausea. Taking Tylenol for headaches which are ineffective. Onset of headaches 1 month ago. Headaches occur almost every day and last for hours at at time.  BP Readings from Last 3 Encounters:  02/02/21 140/80  10/29/20 112/70  10/08/20 125/84      Past Medical History:  Diagnosis Date   Anxiety    Depression    Headache     Past Surgical History:  Procedure Laterality Date   NO PAST SURGERIES      Family History  Problem Relation Age of Onset   Hypertension Neg Hx    Colon cancer Neg Hx    Esophageal cancer Neg Hx    Pancreatic cancer Neg Hx    Stomach cancer Neg Hx    Liver disease Neg Hx     Social History   Socioeconomic History   Marital status: Single    Spouse name: Not on file   Number of children: Not on file   Years of education: Not on file   Highest education level: Not on file   Occupational History   Not on file  Tobacco Use   Smoking status: Never   Smokeless tobacco: Never  Vaping Use   Vaping Use: Never used  Substance and Sexual Activity   Alcohol use: No   Drug use: No   Sexual activity: Yes    Birth control/protection: Condom  Other Topics Concern   Not on file  Social History Narrative   Not on file   Social Determinants of Health   Financial Resource Strain: Not on file  Food Insecurity: Not on file  Transportation Needs: Not on file  Physical Activity: Not on file  Stress: Not on file  Social Connections: Not on file     Observations/Objective: Awake, alert and oriented x 3   Review of Systems  Constitutional:  Negative for fever, malaise/fatigue and weight loss.  HENT: Negative.  Negative for nosebleeds.   Eyes: Negative.  Negative for blurred vision, double vision and photophobia.  Respiratory: Negative.  Negative for cough and shortness of breath.   Cardiovascular: Negative.  Negative for chest pain, palpitations and leg swelling.  Gastrointestinal: Negative.  Negative for heartburn, nausea and vomiting.  Musculoskeletal: Negative.  Negative for myalgias.  Neurological:  Positive for headaches. Negative for dizziness, focal weakness and seizures.  Psychiatric/Behavioral: Negative.  Negative for suicidal ideas.    Assessment and Plan: Diagnoses and all orders for this visit:  Chronic migraine without aura without status migrainosus,  not intractable -     amitriptyline (ELAVIL) 10 MG tablet; Take 1 tablet (10 mg total) by mouth at bedtime. FOR HEADACHES Avoid migraine triggers: caffeine, coffee, processed foods, sugars  Follow Up Instructions Return if symptoms worsen or fail to improve.     I discussed the assessment and treatment plan with the patient. The patient was provided an opportunity to ask questions and all were answered. The patient agreed with the plan and demonstrated an understanding of the instructions.   The  patient was advised to call back or seek an in-person evaluation if the symptoms worsen or if the condition fails to improve as anticipated.  I provided 13 minutes of non-face-to-face time during this encounter including median intraservice time, reviewing previous notes, labs, imaging, medications and explaining diagnosis and management.  Claiborne Rigg, FNP-BC

## 2021-03-26 ENCOUNTER — Encounter: Payer: Self-pay | Admitting: Gastroenterology

## 2021-03-29 ENCOUNTER — Other Ambulatory Visit: Payer: Self-pay

## 2021-03-29 ENCOUNTER — Ambulatory Visit: Payer: Self-pay | Attending: Nurse Practitioner

## 2021-05-05 ENCOUNTER — Telehealth: Payer: Self-pay | Admitting: Nurse Practitioner

## 2021-05-05 NOTE — Telephone Encounter (Signed)
Inbound call from patient requesting to reschedule procedure.  States is having symptoms currently.  Ok to schedule direct or will she need and office visit.  Please advise.

## 2021-05-05 NOTE — Telephone Encounter (Signed)
Tracie [name] please schedule pt for an office visit.

## 2021-05-06 NOTE — Telephone Encounter (Signed)
Patient scheduled for 05/18/2021.

## 2021-05-18 ENCOUNTER — Encounter: Payer: Self-pay | Admitting: Nurse Practitioner

## 2021-05-18 ENCOUNTER — Ambulatory Visit (INDEPENDENT_AMBULATORY_CARE_PROVIDER_SITE_OTHER): Payer: Self-pay | Admitting: Nurse Practitioner

## 2021-05-18 ENCOUNTER — Other Ambulatory Visit: Payer: Self-pay

## 2021-05-18 VITALS — BP 112/80 | HR 110 | Ht 64.0 in | Wt 164.2 lb

## 2021-05-18 DIAGNOSIS — R110 Nausea: Secondary | ICD-10-CM

## 2021-05-18 DIAGNOSIS — K625 Hemorrhage of anus and rectum: Secondary | ICD-10-CM

## 2021-05-18 DIAGNOSIS — R1030 Lower abdominal pain, unspecified: Secondary | ICD-10-CM

## 2021-05-18 DIAGNOSIS — R1010 Upper abdominal pain, unspecified: Secondary | ICD-10-CM

## 2021-05-18 MED ORDER — DICYCLOMINE HCL 10 MG PO CAPS
10.0000 mg | ORAL_CAPSULE | Freq: Three times a day (TID) | ORAL | 0 refills | Status: DC | PRN
  Filled 2021-05-18: qty 60, 20d supply, fill #0

## 2021-05-18 NOTE — Patient Instructions (Addendum)
°  Se le ha programado una EGD y Ardelia Mems colonoscopia. Siga las instrucciones escritas que se le dieron en su visita de hoy. Si [place] inhaladores (aunque solo sea necesario), trigalos el da de su procedimiento.  Contine con Omeprazol 20 MG una vez al [employer].  Cpsula de Dicyclomine 10 MG, tome 1 tres veces al da segn sea necesario para el dolor abdominal. Esto ha sido enviado a la [place].  Benefiber- 1 cucharada diaria.  [facility] Scheduling se comunicar con usted (su identificador de llamadas indicar el nmero de telfono [phone]) en los prximos 7 das para programar su ultrasonido abdominal. Si no ha tenido noticias de ellos dentro de los 7 [place], llame a [employer] al [phone] para hacer un seguimiento del [place] cita.  Gracias por confiar en m para su cuidado gastrointestinal!  Noralyn Pick, CRNP    IMC:   Si tiene 65 aos o ms, su ndice de masa corporal debe estar entre 84 y 33. Su ndice de masa corporal es de 28,19 kg/m. Si esto est fuera del rango mencionado anteriormente, considere hacer un seguimiento con su proveedor de Midwife.   Si tiene 75 aos o menos, su ndice de [employer] corporal debe estar entre 26 y 19. Su ndice de masa corporal es de 28,19 kg/m. Si esto est fuera del rango mencionado anteriormente, considere hacer un seguimiento con su proveedor de Midwife.  MI CARTA:  Los proveedores de Financial controller GI desean alentarlo a que use MYCHART para comunicarse con los proveedores para solicitudes o preguntas que no sean urgentes. Debido a los Astronomer de espera en el telfono, enviar un mensaje a su proveedor por [employer] puede ser una forma ms rpida y eficiente de obtener una respuesta. Espere 48 horas hbiles para obtener [employer]. Recuerde que esto es para solicitudes no urgentes.

## 2021-05-18 NOTE — Progress Notes (Signed)
05/18/2021 Tracie [name] [id number] 1980-09-03   Chief Complaint: Abdominal pain, diarrhea  History of Present Illness: Tracie [name] is a 41 year old female with a past medical history of anxiety, depression and headaches.  I saw Tracie [name] in the office on 02/02/2021 for further evaluation regarding intermittent nausea, generalized abdominal cramping pain and intermittent loose stools.  At that time, she was advised to continue Omeprazole 20 mg daily, and abdominal sonogram and a diagnostic colonoscopy were ordered but were not done as she was uninsured at that time. She applied for [facility] financial assistance and she presents today to schedule an abdominal sonogram and colonoscopy. She speaks Spanish therefore she is accompanied by a [place] Spanish interpretor. She continues to have nausea 2 to 3 weekly. No vomiting. She has heartburn once weekly for the past 3 to 4 months. She has upper abdominal cramping and sometimes has lower abdominal cramping. Hyoscyamine was not tolerated, made her feel warm/flushed. Her stool pattern varied, passes a solid, soft or loose to watery brown stool. She often does not feel emptied after passing a BM. She occasionally sees a small amount of bright red blood on the toilet tissue.   CBC Latest Ref Rng & Units 08/12/2020 06/14/2020 06/14/2020  WBC 3.4 - 10.8 x10E3/uL 6.4 18.2(H) 11.2(H)  Hemoglobin 11.1 - 15.9 g/dL 12.9 11.4(L) 11.9(L)  Hematocrit 34.0 - 46.6 % 39.4 35.0(L) 36.2  Platelets 150 - 450 x10E3/uL 303 220 240    CMP Latest Ref Rng & Units 02/03/2021 08/12/2020 06/14/2020  Glucose 70 - 99 mg/dL 102(H) 85 126(H)  BUN 6 - 20 mg/dL 12 10 <5(L)  Creatinine 0.57 - 1.00 mg/dL 0.61 0.54(L) 0.63  Sodium 134 - 144 mmol/L 143 137 135  Potassium 3.5 - 5.2 mmol/L 4.8 4.2 3.5  Chloride 96 - 106 mmol/L 106 98 109  CO2 20 - 29 mmol/L 22 23 16(L)  Calcium 8.7 - 10.2 mg/dL 9.8 10.0 8.5(L)  Total Protein 6.0 - 8.5 g/dL 7.4 7.5  5.9(L)  Total Bilirubin 0.0 - 1.2 mg/dL 0.3 0.3 0.4  Alkaline Phos 44 - 121 IU/L 63 67 112  AST 0 - 40 IU/L 13 20 21   ALT 0 - 32 IU/L 14 43(H) 13     Past Medical History:  Diagnosis Date   Anxiety    Depression    Headache     Past Surgical History:  Procedure Laterality Date   NO PAST SURGERIES     Current Outpatient Medications on File Prior to Visit  Medication Sig Dispense Refill   amitriptyline (ELAVIL) 10 MG tablet Take 1 tablet (10 mg total) by mouth at bedtime. FOR HEADACHES 30 tablet 3   hydrOXYzine (ATARAX) 25 MG tablet Take 1 tablet (25 mg total) by mouth 3 (three) times daily as needed for anxiety. 60 tablet 2   hyoscyamine (LEVSIN SL) 0.125 MG SL tablet Place 1 tablet (0.125 mg total) under the tongue every 4 (four) hours as needed. 30 tablet 0   omeprazole (PRILOSEC) 20 MG capsule Take 1 capsule (20 mg total) by mouth daily. (Patient taking differently: Take 20 mg by mouth daily as needed.) 30 capsule 3   No current facility-administered medications on file prior to visit.   No Known Allergies  Current Medications, Allergies, Past Medical History, Past Surgical History, Family History and Social History were reviewed in [employer] record.  Review of Systems:   Constitutional: Negative for fever, sweats, chills or weight loss.  Respiratory: Negative for shortness of breath.   Cardiovascular: Negative for chest pain, palpitations and leg swelling.  Gastrointestinal: See HPI.  Musculoskeletal: Negative for back pain or muscle aches.  Neurological: Negative for dizziness, headaches or paresthesias.   Physical Exam: BP 112/80 (BP Location: Left Arm, Patient Position: Sitting, Cuff Size: Normal)    Pulse (!) 110    Ht 5\' 4"  (1.626 m)    Wt 164 lb 4 oz (74.5 kg)    LMP 04/21/2021 (Exact Date)    SpO2 98%    BMI 28.19 kg/m  LMP 05/10/2021. General: 41 year old female anxious in no acute distress. Head: Normocephalic and atraumatic. Eyes: No  scleral icterus. Conjunctiva pink . Ears: Normal auditory acuity. Mouth: Dentition intact. No ulcers or lesions.  Lungs: Clear throughout to auscultation. Heart: Regular rate and rhythm, no murmur. Abdomen: Soft, nontender and nondistended. No masses or hepatomegaly. Normal bowel sounds x 4 quadrants.  Rectal: Deferred. Musculoskeletal: Symmetrical with no gross deformities. Extremities: No edema. Neurological: Alert oriented x 4. No focal deficits.  Psychological: Alert and cooperative. Normal mood and affect  Assessment and Recommendations:  13) 41 year old female with intermittent nausea and upper abdominal pain -RUQ sonogram  -EGD benefits and risks discussed including risk with sedation, risk of bleeding, perforation and infection  -Continue Omeprazole 20mg  daily -Continue GERD diet   2) Variable bowel pattern with intermittent lower abdominal cramping  -Benefiber 1 tbsp as tolerated  -Dicyclomine 10 mg 1 p.o. 3 times daily as needed abdominal cramping/pain -See plan in # 3  3) Rectal bleeding  -Diagnostic colonoscopy benefits and risks discussed including risk with sedation, risk of bleeding, perforation and infection -Apply a small amount of Desitin inside the anal opening and to the external anal area tid as needed for anal or hemorrhoidal irritation/bleeding.    Further recommendations to be determined after the above evaluation completed

## 2021-05-19 NOTE — Progress Notes (Signed)
____________________________________________________________  Attending physician addendum:  Thank you for sending this case to me. I have reviewed the entire note and agree with the plan.   Mckenleigh Tarlton Danis, MD  ____________________________________________________________  

## 2021-05-20 ENCOUNTER — Other Ambulatory Visit: Payer: Self-pay

## 2021-05-26 ENCOUNTER — Other Ambulatory Visit: Payer: Self-pay

## 2021-05-26 ENCOUNTER — Other Ambulatory Visit (HOSPITAL_COMMUNITY)
Admission: RE | Admit: 2021-05-26 | Discharge: 2021-05-26 | Disposition: A | Discharge status: Home / Self Care | Payer: Self-pay | Source: Ambulatory Visit | Attending: Nurse Practitioner | Admitting: Nurse Practitioner

## 2021-05-26 ENCOUNTER — Encounter: Payer: Self-pay | Admitting: Nurse Practitioner

## 2021-05-26 ENCOUNTER — Ambulatory Visit: Payer: Self-pay | Attending: Nurse Practitioner | Admitting: Nurse Practitioner

## 2021-05-26 VITALS — BP 129/86 | HR 99 | Resp 18 | Ht 64.0 in | Wt 164.0 lb

## 2021-05-26 DIAGNOSIS — F411 Generalized anxiety disorder: Secondary | ICD-10-CM

## 2021-05-26 DIAGNOSIS — Z124 Encounter for screening for malignant neoplasm of cervix: Secondary | ICD-10-CM

## 2021-05-26 DIAGNOSIS — R519 Headache, unspecified: Secondary | ICD-10-CM

## 2021-05-26 DIAGNOSIS — R7309 Other abnormal glucose: Secondary | ICD-10-CM

## 2021-05-26 MED ORDER — BUSPIRONE HCL 15 MG PO TABS
15.0000 mg | ORAL_TABLET | Freq: Two times a day (BID) | ORAL | 1 refills | Status: AC
  Filled 2021-05-26: qty 60, 30d supply, fill #0

## 2021-05-26 MED ORDER — TOPIRAMATE 25 MG PO TABS
25.0000 mg | ORAL_TABLET | Freq: Every day | ORAL | 0 refills | Status: DC
  Filled 2021-05-26: qty 30, 30d supply, fill #0

## 2021-05-26 NOTE — Progress Notes (Signed)
? ?Assessment & Plan:  ?Wilder was seen today for gynecologic exam. ? ?Diagnoses and all orders for this visit: ? ?Encounter for Papanicolaou smear for cervical cancer screening ?-     Cytology - PAP ?-     Cervicovaginal ancillary only ? ?Generalized anxiety disorder ?-     busPIRone (BUSPAR) 15 MG tablet; Take 1 tablet (15 mg total) by mouth 2 (two) times daily. ? ?Nonintractable headache, unspecified chronicity pattern, unspecified headache type ?-     topiramate (TOPAMAX) 25 MG tablet; Take 1 tablet (25 mg total) by mouth daily. ? ?Elevated glucose ?-     Hemoglobin A1c ? ? ? ?Patient has been counseled on age-appropriate routine health concerns for screening and prevention. These are reviewed and up-to-date. Referrals have been placed accordingly. Immunizations are up-to-date or declined.    ?Subjective:  ? ?Chief Complaint  ?Patient presents with  ? Gynecologic Exam  ? ?HPI ?Tracie [name] 41 y.o. female presents to office today for PAP smear. She is currently being followed by GI for rectal bleeding and abdominal pain with nausea.  ? ? ?Notes headache pain unrelieved with elavil. Will try topamax. Associated symptoms include dizziness and feeling like electricity is in my head and face.  ? ?GAD ?Does not feel hydroxyzine is effective. Will switch to buspar.  ? ?Review of Systems  ?Constitutional: Negative.  Negative for chills, fever, malaise/fatigue and weight loss.  ?Respiratory: Negative.  Negative for cough, shortness of breath and wheezing.   ?Cardiovascular: Negative.  Negative for chest pain, orthopnea and leg swelling.  ?Gastrointestinal:  Negative for abdominal pain.  ?Genitourinary: Negative.  Negative for flank pain.  ?Skin: Negative.  Negative for rash.  ?Neurological:  Positive for dizziness and headaches.  ?Psychiatric/Behavioral:  Negative for suicidal ideas. The patient is nervous/anxious.   ? ?Past Medical History:  ?Diagnosis Date  ? Anxiety   ? Depression   ? Headache   ? ? ?Past  Surgical History:  ?Procedure Laterality Date  ? NO PAST SURGERIES    ? ? ?Family History  ?Problem Relation Age of Onset  ? Hypertension Neg Hx   ? Colon cancer Neg Hx   ? Esophageal cancer Neg Hx   ? Pancreatic cancer Neg Hx   ? Stomach cancer Neg Hx   ? Liver disease Neg Hx   ? ? ?Social History Reviewed with no changes to be made today.  ? ?Outpatient Medications Prior to Visit  ?Medication Sig Dispense Refill  ? dicyclomine (BENTYL) 10 MG capsule Take 1 capsule (10 mg total) by mouth 3 (three) times daily as needed for spasms (abdominal pain). 60 capsule 0  ? hydrOXYzine (ATARAX) 25 MG tablet Take 1 tablet (25 mg total) by mouth 3 (three) times daily as needed for anxiety. 60 tablet 2  ? hyoscyamine (LEVSIN SL) 0.125 MG SL tablet Place 1 tablet (0.125 mg total) under the tongue every 4 (four) hours as needed. 30 tablet 0  ? omeprazole (PRILOSEC) 20 MG capsule Take 1 capsule (20 mg total) by mouth daily. (Patient taking differently: Take 20 mg by mouth daily as needed.) 30 capsule 3  ? amitriptyline (ELAVIL) 10 MG tablet Take 1 tablet (10 mg total) by mouth at bedtime. FOR HEADACHES 30 tablet 3  ? ?No facility-administered medications prior to visit.  ? ? ?No Known Allergies ? ?   ?Objective:  ?  ?BP 129/86   Pulse 99   Resp 18   Ht 5\' 4"  (1.626 m)   Wt 164  lb (74.4 kg)   LMP 05/10/2021   SpO2 100%   Breastfeeding No   BMI 28.15 kg/m?  ?Wt Readings from Last 3 Encounters:  ?05/26/21 164 lb (74.4 kg)  ?05/18/21 164 lb 4 oz (74.5 kg)  ?02/02/21 164 lb 9.6 oz (74.7 kg)  ? ? ?Physical Exam ?Exam conducted with a chaperone present.  ?Constitutional:   ?   Appearance: She is well-developed.  ?HENT:  ?   Head: Normocephalic.  ?Cardiovascular:  ?   Rate and Rhythm: Normal rate and regular rhythm.  ?   Heart sounds: Normal heart sounds.  ?Pulmonary:  ?   Effort: Pulmonary effort is normal.  ?   Breath sounds: Normal breath sounds.  ?Abdominal:  ?   General: Bowel sounds are normal.  ?   Palpations: Abdomen is  soft.  ?   Hernia: There is no hernia in the left inguinal area.  ?Genitourinary: ?   Exam position: Lithotomy position.  ?   Labia:     ?   Right: No rash, tenderness, lesion or injury.     ?   Left: No rash, tenderness, lesion or injury.   ?   Vagina: Normal. No signs of injury and foreign body. No vaginal discharge, erythema, tenderness or bleeding.  ?   Cervix: Normal.  ?   Uterus: Not deviated and not enlarged.   ?   Adnexa:     ?   Right: No mass, tenderness or fullness.      ?   Left: No mass, tenderness or fullness.    ?   Rectum: Normal. No external hemorrhoid.  ?Lymphadenopathy:  ?   Lower Body: No right inguinal adenopathy. No left inguinal adenopathy.  ?Skin: ?   General: Skin is warm and dry.  ?Neurological:  ?   Mental Status: She is alert and oriented to person, place, and time.  ?Psychiatric:     ?   Behavior: Behavior normal.     ?   Thought Content: Thought content normal.     ?   Judgment: Judgment normal.  ? ? ? ? ?   ?Patient has been counseled extensively about nutrition and exercise as well as the importance of adherence with medications and regular follow-up. The patient was given clear instructions to go to ER or return to medical center if symptoms don't improve, worsen or new problems develop. The patient verbalized understanding.  ? ?Follow-up: Return in about 6 weeks (around 07/07/2021) for virtual tuesday for headaches 6 weeks.  ? ?Claiborne Rigg, FNP-BC ?[facility] ?[place], [place] ?[phone]   ?05/26/2021, 10:37 AM ?

## 2021-05-27 LAB — CERVICOVAGINAL ANCILLARY ONLY
Bacterial Vaginitis (gardnerella): NEGATIVE
Candida Glabrata: NEGATIVE
Candida Vaginitis: NEGATIVE
Chlamydia: NEGATIVE
Comment: NEGATIVE
Comment: NEGATIVE
Comment: NEGATIVE
Comment: NEGATIVE
Comment: NEGATIVE
Comment: NORMAL
Neisseria Gonorrhea: NEGATIVE
Trichomonas: NEGATIVE

## 2021-05-27 LAB — CYTOLOGY - PAP
Comment: NEGATIVE
Diagnosis: NEGATIVE
High risk HPV: NEGATIVE

## 2021-05-27 LAB — HEMOGLOBIN A1C
Est. average glucose Bld gHb Est-mCnc: 114 mg/dL
Hgb A1c MFr Bld: 5.6 % (ref 4.8–5.6)

## 2021-06-11 ENCOUNTER — Other Ambulatory Visit: Payer: Self-pay

## 2021-06-11 ENCOUNTER — Ambulatory Visit (HOSPITAL_COMMUNITY)
Admission: RE | Admit: 2021-06-11 | Discharge: 2021-06-11 | Disposition: A | Discharge status: Home / Self Care | Payer: Self-pay | Source: Ambulatory Visit | Attending: Nurse Practitioner | Admitting: Nurse Practitioner

## 2021-06-11 DIAGNOSIS — K625 Hemorrhage of anus and rectum: Secondary | ICD-10-CM | POA: Insufficient documentation

## 2021-06-11 DIAGNOSIS — R1010 Upper abdominal pain, unspecified: Secondary | ICD-10-CM | POA: Insufficient documentation

## 2021-06-11 DIAGNOSIS — R1030 Lower abdominal pain, unspecified: Secondary | ICD-10-CM | POA: Insufficient documentation

## 2021-06-11 DIAGNOSIS — R110 Nausea: Secondary | ICD-10-CM | POA: Insufficient documentation

## 2021-06-30 ENCOUNTER — Encounter: Payer: Self-pay | Admitting: Certified Registered Nurse Anesthetist

## 2021-07-01 ENCOUNTER — Other Ambulatory Visit: Payer: Self-pay

## 2021-07-01 ENCOUNTER — Encounter: Payer: Self-pay | Admitting: Gastroenterology

## 2021-07-01 ENCOUNTER — Ambulatory Visit (AMBULATORY_SURGERY_CENTER): Payer: Self-pay | Admitting: Gastroenterology

## 2021-07-01 VITALS — BP 108/62 | HR 71 | Temp 98.4°F | Resp 14 | Ht 64.0 in | Wt 164.0 lb

## 2021-07-01 DIAGNOSIS — K5730 Diverticulosis of large intestine without perforation or abscess without bleeding: Secondary | ICD-10-CM

## 2021-07-01 DIAGNOSIS — R110 Nausea: Secondary | ICD-10-CM

## 2021-07-01 DIAGNOSIS — R112 Nausea with vomiting, unspecified: Secondary | ICD-10-CM

## 2021-07-01 DIAGNOSIS — K625 Hemorrhage of anus and rectum: Secondary | ICD-10-CM

## 2021-07-01 DIAGNOSIS — K3189 Other diseases of stomach and duodenum: Secondary | ICD-10-CM

## 2021-07-01 DIAGNOSIS — R1084 Generalized abdominal pain: Secondary | ICD-10-CM

## 2021-07-01 DIAGNOSIS — K529 Noninfective gastroenteritis and colitis, unspecified: Secondary | ICD-10-CM

## 2021-07-01 DIAGNOSIS — K648 Other hemorrhoids: Secondary | ICD-10-CM

## 2021-07-01 MED ORDER — ONDANSETRON HCL 4 MG PO TABS
4.0000 mg | ORAL_TABLET | Freq: Three times a day (TID) | ORAL | 2 refills | Status: DC | PRN
  Filled 2021-07-01 – 2021-07-15 (×2): qty 30, 10d supply, fill #0

## 2021-07-01 MED ORDER — SODIUM CHLORIDE 0.9 % IV SOLN: 500.0000 mL | Freq: Once | INTRAVENOUS | Status: DC

## 2021-07-01 NOTE — Op Note (Signed)
[facility] ?Patient Name: Tracie [name] ?Procedure Date: 07/01/2021 8:01 AM ?MRN: [id number] ?Endoscopist: Starr Lake. Myrtie Neither , MD ?Age: 41 ?Referring MD:  ?Date of Birth: 10-22-1980 ?Gender: Female ?Account #: [id number] ?Procedure:                Upper GI endoscopy ?Indications:              Generalized abdominal pain, Nausea with vomiting ?                          recent US with gallstones ?Medicines:                Monitored Anesthesia Care ?Procedure:                Pre-Anesthesia Assessment: ?                          - Prior to the procedure, a History and Physical  ?                          was performed, and patient medications and  ?                          allergies were reviewed. The patient's tolerance of  ?                          previous anesthesia was also reviewed. The risks  ?                          and benefits of the procedure and the sedation  ?                          options and risks were discussed with the patient.  ?                          All questions were answered, and informed consent  ?                          was obtained. Prior Anticoagulants: The patient has  ?                          taken no previous anticoagulant or antiplatelet  ?                          agents. ASA Grade Assessment: II - A patient with  ?                          mild systemic disease. After reviewing the risks  ?                          and benefits, the patient was deemed in  ?                          satisfactory condition to undergo the procedure. ?  After obtaining informed consent, the endoscope was  ?                          passed under direct vision. Throughout the  ?                          procedure, the patient's blood pressure, pulse, and  ?                          oxygen saturations were monitored continuously. The  ?                          GIF HQ190 #[id number] was introduced through the  ?                          mouth, and advanced to the  second part of duodenum.  ?                          The upper GI endoscopy was accomplished without  ?                          difficulty. The patient tolerated the procedure  ?                          well. ?Scope In: ?Scope Out: ?Findings:                 The larynx was normal. ?                          The esophagus was normal. ?                          The entire examined stomach was normal. Several  ?                          biopsies were obtained on the greater curvature of  ?                          the gastric body, on the lesser curvature of the  ?                          gastric body, on the greater curvature of the  ?                          gastric antrum and on the lesser curvature of the  ?                          gastric antrum with cold forceps for histology. ?                          The cardia and gastric fundus were normal on  ?                          retroflexion. ?  The examined duodenum was normal. Biopsies for  ?                          histology were taken with a cold forceps for  ?                          evaluation of celiac disease. ?Complications:            No immediate complications. ?Estimated Blood Loss:     Estimated blood loss was minimal. ?Impression:               - Normal larynx. ?                          - Normal esophagus. ?                          - Normal stomach. ?                          - Normal examined duodenum. Biopsied. ?                          - Several biopsies were obtained on the greater  ?                          curvature of the gastric body, on the lesser  ?                          curvature of the gastric body, on the greater  ?                          curvature of the gastric antrum and on the lesser  ?                          curvature of the gastric antrum. ?Recommendation:           - Patient has a contact number available for  ?                          emergencies. The signs and symptoms of potential  ?                           delayed complications were discussed with the  ?                          patient. Return to normal activities tomorrow.  ?                          Written discharge instructions were provided to the  ?                          patient. ?                          - Resume previous diet. ?                          -  Continue present medications. ?                          - Await pathology results. ?                          - See the other procedure note for documentation of  ?                          additional recommendations. ?                          - Use Zofran (ondansetron) 4 mg PO every 8 hours as  ?                          needed for nausea. Disp# 30, RF 2 ?Dollie Bressi L. Myrtie Neither, MD ?07/01/2021 8:42:06 AM ?This report has been signed electronically. ?

## 2021-07-01 NOTE — Patient Instructions (Addendum)
_______________________________________________________ ? ?Food Guidelines for those with chronic digestive trouble: ? ?Many people have difficulty digesting certain foods, causing a variety of distressing and embarrassing symptoms such as abdominal pain, bloating and gas.  These foods may need to be avoided or consumed in small amounts.  Here are some tips that might be helpful for you. ? ?1.   Lactose intolerance is the difficulty or complete inability to digest lactose, the natural sugar in milk and anything made from milk.  This condition is harmless, common, and can begin any time during life.  Some people can digest a modest amount of lactose while others cannot tolerate any.  Also, not all dairy products contain equal amounts of lactose.  For example, hard cheeses such as parmesan have less lactose than soft cheeses such as cheddar.  Yogurt has less lactose than milk or cheese.  Many packaged foods (even many brands of bread) have milk, so read ingredient lists carefully.  It is difficult to test for lactose intolerance, so just try avoiding lactose as much as possible for a week and see what happens with your symptoms.  If you seem to be lactose intolerant, the best plan is to avoid it (but make sure you get calcium from another source).  The next best thing is to use lactase enzyme supplements, available over the counter everywhere.  Just know that many lactose intolerant people need to take several tablets with each serving of dairy to avoid symptoms.  Lastly, a lot of restaurant food is made with milk or butter.  Many are things you might not suspect, such as mashed potatoes, rice and pasta (cooked with butter) and "grilled" items.  If you are lactose intolerant, it never hurts to ask your server what has milk or butter. ? ?2.   Fiber is an important part of your diet, but not all fiber is well-tolerated.  Insoluble fiber such as bran is often consumed by normal gut bacteria and converted into gas.   Soluble fiber such as oats, squash, carrots and green beans are typically tolerated better. ? ?3.   Some types of carbohydrates can be poorly digested.  Examples include: fructose (apples, cherries, pears, raisins and other dried fruits), fructans (onions, zucchini, large amounts of wheat), sorbitol/mannitol/xylitol and sucralose/Splenda (common artificial sweeteners), and raffinose (lentils, broccoli, cabbage, asparagus, brussel sprouts, many types of beans).  ?Do a Development worker, community for FODMAP diet and you will find helpful information. ?Beano, a dietary supplement, will often help with raffinose-containing foods.  As with lactase tablets, you may need several per serving. ? ?4.   Whenever possible, avoid processed food&meats and chemical additives.  High fructose corn syrup, a common sweetener, may be difficult to digest.  Eggs and soy (comes from the soybean, and added to many foods now) are other common bloating/gassy foods. ? ?5.  Regarding gluten:  gluten is a protein mainly found in wheat, but also rye and barley.  There is a condition called celiac sprue, which is an inflammatory reaction in the small intestine causing a variety of digestive symptoms.  Blood testing is highly reliable to look for this condition, and sometimes upper endoscopy with small bowel biopsies may be necessary to make the diagnosis.  Many patients who test negative for celiac sprue report improvement in their digestive symptoms when they switch to a gluten-free diet.  However, in these "non-celiac gluten sensitive" patients, the true role of gluten in their symptoms is unclear.  Reducing carbohydrates in general may decrease the gas  and bloating caused when gut bacteria consume carbs. Also, some of these patients may actually be intolerant of the baker's yeast in bread products rather than the gluten.  Flatbread and other reduced yeast breads might therefore be tolerated.  There is no specific testing available for most food intolerances,  which are discovered mainly by dietary elimination.  Please do not embark on a gluten free diet unless directed by your doctor, as it is highly restrictive, and may lead to nutritional deficiencies if not carefully monitored.  Lastly, beware of internet claims offering "personalized" tests for food intolerances.  Such testing has no reliable scientific evidence to support its reliability and correlation to symptoms.   ? ?6.  The best advice is old advice, especially for those with chronic digestive trouble - try to eat "clean".  Balanced diet, avoid processed food, plenty of fruits and vegetables, cut down the sugar, minimal alcohol, avoid tobacco. ?Make time to care for yourself, get enough sleep, exercise when you can, reduce stress.  Your guts will thank you for it. ? ? ?- Dr. Wilfrid Lund ?[facility] ? ?Handouts given for diverticulosis. ? ?PICK UP NEW RX FOR ANTI-NAUSEA MEDICATION CALLED ZOFRAN. ? ? ?USTED Lillia Abed PROCEDIMIENTO ENDOSC?[facility]:   Lea el informe del procedimiento que se le entreg? para cualquier pregunta espec?fica sobre lo que se encontr? Education administrator.  Si el informe del examen no responde a sus preguntas, por favor llame a su gastroenter?logo para aclararlo.  Si usted solicit? que no se le den [employer] de lo que se Estate manager/land agent? en su procedimiento al acompa?ante que le va a cuidar, entonces el informe del procedimiento se ha incluido en un sobre sellado para que usted lo revise despu?s cuando le sea m?s conveniente. ?  ?LO QUE PUEDE ESPERAR: Algunas sensaciones de hinchaz?n en el abdomen.  Puede tener m?s gases de lo normal.  El caminar puede ayudarle a eliminar el aire que se le puso en el tracto gastrointestinal durante el procedimiento y reducir la hinchaz?n.  Si le hicieron una endoscopia inferior (como una colonoscopia o una sigmoidoscopia flexible), podr?a notar manchas de sangre en las heces fecales o en el papel higi?nico.  Si se someti? a  una preparaci?n intestinal para su procedimiento, es posible que no tenga una evacuaci?n intestinal normal durante algunos d?as. ?  ?Tenga en cuenta:  Es posible que note un poco de irritaci?n y congesti?n en la nariz o alg?n drenaje.  Esto es debido al ox?geno utilizado durante su procedimiento.  No hay que preocuparse y esto debe desaparecer m?s o menos en un d?a. ?  ?S?NTOMAS PARA REPORTAR INMEDIATAMENTE: ? Despu?s de una endoscopia inferior (colonoscopia o sigmoidoscopia flexible): ? Cantidades excesivas de sangre en las heces fecales ? Sensibilidad significativa o empeoramiento de los dolores abdominales  ? Hinchaz?n aguda del abdomen que antes no ten?a  ? Fiebre de 100?F o m?s ?  ?Despu?s de la endoscopia superior (EGD) ? V?mitos de sangre o material como caf? molido  ? Dolor en el pecho o dolor debajo de los om?platos que antes no ten?a  ? Dolor o dificultad persistente para tragar ? Falta de aire que antes no ten?a  ? Heces fecales negras y pegajosas ?  ?Para asuntos urgentes o de emergencia, puede comunicarse con un gastroenter?logo a cualquier hora llamando al  ?[phone]. ? ?DIETA:  Recomendamos una comida peque?a al principio, pero luego puede continuar con su dieta normal.  Tome muchos l?quidos,  pero debe evitar las bebidas alcoh?licas durante 24 horas.  ?  ?ACTIVIDAD:  Debe planear tomarse las cosas con calma por el resto del d?a y no debe CONDUCIR ni usar maquinaria pesada hasta ma?ana (debido a los medicamentos de sedaci?n Furniture conservator/restorer).   ?  ?SEGUIMIENTO: ?Teacher, music? al n?mero que aparece en su historial al siguiente d?a h?bil de su procedimiento para ver c?mo se siente y para responder cualquier pregunta o inquietud que pueda tener con respecto a la informaci?n que se le dio despu?s del procedimiento. Si no podemos contactarle, le dejaremos un mensaje.  Sin embargo, si se siente bien y no tiene ning?n problema, no es necesario que nos devuelva la llamada.   Asumiremos que ha regresado a sus actividades diarias normales sin incidentes. ?Si se le tomaron algunas biopsias, le contactaremos por tel?fono o por carta en las pr?ximas 3 semanas.  Si no ha sabido [employer]

## 2021-07-01 NOTE — Op Note (Signed)
[facility] ?Patient Name: Tracie [name] ?Procedure Date: 07/01/2021 8:01 AM ?MRN: [id number] ?Endoscopist: Starr Lake. Myrtie Neither , MD ?Age: 41 ?Referring MD:  ?Date of Birth: [birth year] ?Gender: Female ?Account #: [id number] ?Procedure:                Colonoscopy ?Indications:              Generalized abdominal pain, Chronic diarrhea,  ?                          Rectal bleeding ?                          (dicyclomine not tolerated) ?Medicines:                Monitored Anesthesia Care ?Procedure:                Pre-Anesthesia Assessment: ?                          - Prior to the procedure, a History and Physical  ?                          was performed, and patient medications and  ?                          allergies were reviewed. The patient's tolerance of  ?                          previous anesthesia was also reviewed. The risks  ?                          and benefits of the procedure and the sedation  ?                          options and risks were discussed with the patient.  ?                          All questions were answered, and informed consent  ?                          was obtained. Prior Anticoagulants: The patient has  ?                          taken no previous anticoagulant or antiplatelet  ?                          agents. ASA Grade Assessment: II - A patient with  ?                          mild systemic disease. After reviewing the risks  ?                          and benefits, the patient was deemed in  ?  satisfactory condition to undergo the procedure. ?                          After obtaining informed consent, the colonoscope  ?                          was passed under direct vision. Throughout the  ?                          procedure, the patient's blood pressure, pulse, and  ?                          oxygen saturations were monitored continuously. The  ?                          Olympus CF-HQ190L (#[id number]) Colonoscope was  ?                           introduced through the anus and advanced to the the  ?                          terminal ileum, with identification of the  ?                          appendiceal orifice and IC valve. The colonoscopy  ?                          was performed without difficulty. The patient  ?                          tolerated the procedure well. The quality of the  ?                          bowel preparation was good. The terminal ileum,  ?                          ileocecal valve, appendiceal orifice, and rectum  ?                          were photographed. ?Scope In: 8:09:08 AM ?Scope Out: 8:21:00 AM ?Scope Withdrawal Time: 0 hours 9 minutes 2 seconds  ?Total Procedure Duration: 0 hours 11 minutes 52 seconds  ?Findings:                 The perianal and digital rectal examinations were  ?                          normal. ?                          The terminal ileum appeared normal. ?                          Normal mucosa was found in the entire colon.  ?  Biopsies for histology were taken with a cold  ?                          forceps from the right colon and left colon for  ?                          evaluation of microscopic colitis. ?                          A few diverticula were found in the sigmoid colon. ?                          Internal hemorrhoids were found. The hemorrhoids  ?                          were small. ?                          The exam was otherwise without abnormality on  ?                          direct and retroflexion views. ?Complications:            No immediate complications. ?Estimated Blood Loss:     Estimated blood loss was minimal. ?Impression:               - The examined portion of the ileum was normal. ?                          - Normal mucosa in the entire examined colon.  ?                          Biopsied. ?                          - Diverticulosis in the sigmoid colon. ?                          - Internal hemorrhoids. ?                           - The examination was otherwise normal on direct  ?                          and retroflexion views. ?Recommendation:           - Patient has a contact number available for  ?                          emergencies. The signs and symptoms of potential  ?                          delayed complications were discussed with the  ?                          patient. Return to normal activities tomorrow.  ?  Written discharge instructions were provided to the  ?                          patient. ?                          - Resume previous diet. (see discharge instructions  ?                          for additional dietary advice: GI symptoms) ?                          - Continue present medications. ?                          - Await pathology results. ?                          - Repeat colonoscopy in 10 years for screening  ?                          purposes. ?                          - See the other procedure note for documentation of  ?                          additional recommendations. ?Raven Harmes L. Myrtie Neither, MD ?07/01/2021 8:37:38 AM ?This report has been signed electronically. ?

## 2021-07-01 NOTE — Progress Notes (Signed)
Pt's states no medical or surgical changes since previsit or office visit. 

## 2021-07-01 NOTE — Progress Notes (Signed)
History and Physical: ? This patient presents for endoscopic testing for: ?Encounter Diagnoses  ?Name Primary?  ? Nausea without vomiting Yes  ? Generalized abdominal pain   ? Rectal bleeding   ? Altered bowel habits   ? ? ?Clinical details in last [facility] office note of 05/18/21 ?Patient reports unchanged since then. ?Gallstones on recent abd [place] ? ?ROS: ?Patient denies chest pain or shortness of breath ? ? ?Past Medical History: ?Past Medical History:  ?Diagnosis Date  ? Anxiety   ? Depression   ? Headache   ? ? ? ?Past Surgical History: ?Past Surgical History:  ?Procedure Laterality Date  ? NO PAST SURGERIES    ? ? ?Allergies: ?No Known Allergies ? ?Outpatient Meds: ?Current Outpatient Medications  ?Medication Sig Dispense Refill  ? omeprazole (PRILOSEC) 20 MG capsule Take 1 capsule (20 mg total) by mouth daily. (Patient taking differently: Take 20 mg by mouth daily as needed.) 30 capsule 3  ? dicyclomine (BENTYL) 10 MG capsule Take 1 capsule (10 mg total) by mouth 3 (three) times daily as needed for spasms (abdominal pain). 60 capsule 0  ? hydrOXYzine (ATARAX) 25 MG tablet Take 1 tablet (25 mg total) by mouth 3 (three) times daily as needed for anxiety. 60 tablet 2  ? hyoscyamine (LEVSIN SL) 0.125 MG SL tablet Place 1 tablet (0.125 mg total) under the tongue every 4 (four) hours as needed. 30 tablet 0  ? topiramate (TOPAMAX) 25 MG tablet Take 1 tablet (25 mg total) by mouth daily. 90 tablet 0  ? ?Current Facility-Administered Medications  ?Medication Dose Route Frequency Provider Last Rate Last Admin  ? 0.9 %  sodium chloride infusion  500 mL Intravenous Once Doran Stabler, MD      ? ? ? ? ?___________________________________________________________________ ?Objective  ? ?Exam: ? ?BP (!) 130/91   Pulse (!) 112   Temp 98.4 ?F (36.9 ?C) (Temporal)   Ht 5\' 4"  (1.626 m)   Wt 164 lb (74.4 kg)   SpO2 100%   BMI 28.15 kg/m?  ? ?CV: RRR without murmur, S1/S2 ?Resp: clear to auscultation bilaterally, normal RR and  effort noted ?GI: soft, no tenderness, with active bowel sounds. ? ? ?Assessment: ?Encounter Diagnoses  ?Name Primary?  ? Nausea without vomiting Yes  ? Generalized abdominal pain   ? Rectal bleeding   ? Altered bowel habits   ? ? ? ?Plan: ?Colonoscopy ?EGD ? The benefits and risks of the planned procedure were described in detail with the patient or (when appropriate) their health care proxy.  Risks were outlined as including, but not limited to, bleeding, infection, perforation, adverse medication reaction leading to cardiac or pulmonary decompensation, pancreatitis (if ERCP).  The limitation of incomplete mucosal visualization was also discussed.  No guarantees or warranties were given. ? ?(Spanish interpreter present for pre-procedure evaluation) ? ?The patient is appropriate for an endoscopic procedure in the ambulatory setting. ? ? - Wilfrid Lund, MD ? ? ? ? ?

## 2021-07-01 NOTE — Progress Notes (Signed)
Called to room to assist during endoscopic procedure.  Patient ID and intended procedure confirmed with present staff. Received instructions for my participation in the procedure from the performing physician.  

## 2021-07-01 NOTE — Progress Notes (Signed)
Pt non-responsive, VVS, Report to RN  °

## 2021-07-05 ENCOUNTER — Telehealth: Payer: Self-pay | Admitting: *Deleted

## 2021-07-05 ENCOUNTER — Telehealth: Payer: Self-pay

## 2021-07-05 NOTE — Telephone Encounter (Signed)
No answer, left message to call back later today, B.Brittney Mucha RN. 

## 2021-07-05 NOTE — Telephone Encounter (Signed)
?  Follow up Call- ? ? ?  07/01/2021  ?  7:10 AM  ?Call back number  ?Post procedure Call Back phone  # [phone]  ?Permission to leave phone message Yes  ?  ? ?Patient questions: ? ?Do you have a fever, pain , or abdominal swelling? No. ?Pain Score  0 * ? ?Have you tolerated food without any problems? Yes.   ? ?Have you been able to return to your normal activities? Yes.   ? ?Do you have any questions about your discharge instructions: ?Diet   No. ?Medications  No. ?Follow up visit  No. ? ?Do you have questions or concerns about your Care? No. ? ?Actions: ?* If pain score is 4 or above: ?No action needed, pain <4. ? ? ?

## 2021-07-07 ENCOUNTER — Other Ambulatory Visit: Payer: Self-pay

## 2021-07-07 MED ORDER — HYOSCYAMINE SULFATE 0.125 MG SL SUBL
SUBLINGUAL_TABLET | SUBLINGUAL | 1 refills | Status: DC
  Filled 2021-07-07: qty 30, 10d supply, fill #0

## 2021-07-13 ENCOUNTER — Other Ambulatory Visit: Payer: Self-pay

## 2021-07-15 ENCOUNTER — Other Ambulatory Visit: Payer: Self-pay

## 2021-07-22 ENCOUNTER — Ambulatory Visit (INDEPENDENT_AMBULATORY_CARE_PROVIDER_SITE_OTHER): Payer: Self-pay | Admitting: Physician Assistant

## 2021-07-22 ENCOUNTER — Encounter: Payer: Self-pay | Admitting: Physician Assistant

## 2021-07-22 DIAGNOSIS — K8020 Calculus of gallbladder without cholecystitis without obstruction: Secondary | ICD-10-CM

## 2021-07-22 MED ORDER — HYOSCYAMINE SULFATE 0.125 MG SL SUBL
SUBLINGUAL_TABLET | SUBLINGUAL | 4 refills | Status: DC
  Filled 2021-07-30: qty 90, 30d supply, fill #0

## 2021-07-22 NOTE — Patient Instructions (Addendum)
If you are age 39 or younger, your body mass index should be between 19-25. Your Body mass index is 28.08 kg/m?[name] Kitchen If this is out of the aformentioned range listed, please consider follow up with your Primary Care Provider.  ?________________________________________________________ ? ?The  GI providers would like to encourage you to use [facility] to communicate with providers for non-urgent requests or questions.  Due to long hold times on the telephone, sending your provider a message by [facility] may be a faster and more efficient way to get a response.  Please allow 48 business hours for a response.  Please remember that this is for non-urgent requests.  ?_______________________________________________________ ? ?Continue Omeprazole 320 mg daily ? ?Continue your fiber supplement ? ?Follow up in 1 year or sooner if needed. ? ?Thank you for entrusting me with your care and choosing [facility]. ? ?Mike Gip, PA-C ?

## 2021-07-22 NOTE — Progress Notes (Signed)
? ?Subjective:  ? ? Patient ID: Tracie [name], female    DOB: 1981/01/03, 41 y.o.   MRN: [id number] ? ?HPI ? Tracie [name] is a 41 year old non-English-speaking Hispanic female, established recently with Tracie [name], who comes in today for follow-up. ?When she was initially seen she was having some issues with nausea, and alternating bowel habits and had also mentioned some intermittent rectal bleeding. ?She subsequently underwent upper abdominal ultrasound which did show multiple gallstones, no evidence of gallbladder wall thickening, CBD 5 mm and some changes of fatty liver.  She was then set up for colonoscopy and EGD which were done on 07/05/2021. ?EGD was normal.  Biopsies of the stomach showed no H. pylori, no intestinal metaplasia. ?Colonoscopy revealed a few sigmoid diverticuli and small internal hemorrhoids and was otherwise negative.  Biopsies were done to rule out microscopic colitis and these were negative. ?Is Dr. Irving [name] impression that her symptoms are more likely associated with IBS.  She was initially given a trial of dicyclomine but this caused her some side effects and then prescription for Levsin was to be called in. ?She says that her pharmacy did not have a prescription for her for the Levsin so she has not gotten this.  She continues on omeprazole 20 mg p.o. every morning. ?She says her nausea symptoms are better and that that had never been an ongoing issue but was occasional and seems to be dependent on what she eats.  This seems to occur more frequently with chili and fatty foods which she has been avoiding.  She is not having any regular abdominal pain at this point either and says again that has improved since she is being more careful with her diet.  She occasionally will have a mild discomfort in the left upper quadrant under her ribs that will come and go.  Bowel habits continue to alternate. ? ?Review of Systems Pertinent positive and negative review of systems were noted in the above HPI  section.  All other review of systems was otherwise negative.  ? ?Outpatient Encounter Medications as of 07/22/2021  ?Medication Sig  ? omeprazole (PRILOSEC) 20 MG capsule Take 1 capsule (20 mg total) by mouth daily. (Patient taking differently: Take 20 mg by mouth daily as needed.)  ? ondansetron (ZOFRAN) 4 MG tablet Take 1 tablet (4 mg total) by mouth every 8 (eight) hours as needed for nausea or vomiting.  ? topiramate (TOPAMAX) 25 MG tablet Take 1 tablet (25 mg total) by mouth daily.  ? [DISCONTINUED] hydrOXYzine (ATARAX) 25 MG tablet Take 1 tablet (25 mg total) by mouth 3 (three) times daily as needed for anxiety.  ? [DISCONTINUED] hyoscyamine (LEVSIN SL) 0.125 MG SL tablet Take 1 tablet by mouth 2-3 times daily for abdominal cramps and diarrhea  ? hyoscyamine (LEVSIN SL) 0.125 MG SL tablet Take 1 tablet by mouth 2-3 times daily for abdominal cramps and diarrhea  ? [DISCONTINUED] dicyclomine (BENTYL) 10 MG/5ML solution Take 10 mg by mouth daily.  ? ?No facility-administered encounter medications on file as of 07/22/2021.  ? ?No Known Allergies ?Patient Active Problem List  ? Diagnosis Date Noted  ? Vaginal delivery 06/15/2020  ? Supervision of low-risk pregnancy, third trimester 06/14/2020  ? Vaginal bleeding in pregnancy, third trimester 05/19/2020  ? Sciatic pain, right 04/30/2020  ? Biological false positive RPR test 04/05/2020  ? Multigravida of advanced maternal age in third trimester 04/02/2020  ? Language barrier 04/02/2020  ? Supervision of high risk pregnancy, antepartum 12/18/2019  ? Anxiety and  depression 06/12/2014  ? Generalized anxiety disorder 06/12/2014  ? Bilateral chronic knee pain 10/28/2013  ? ?Social History  ? ?Socioeconomic History  ? Marital status: Single  ?  Spouse name: Not on file  ? Number of children: Not on file  ? Years of education: Not on file  ? Highest education level: Not on file  ?Occupational History  ? Not on file  ?Tobacco Use  ? Smoking status: Never  ? Smokeless tobacco:  Never  ?Vaping Use  ? Vaping Use: Never used  ?Substance and Sexual Activity  ? Alcohol use: No  ? Drug use: No  ? Sexual activity: Yes  ?  Birth control/protection: Condom  ?Other Topics Concern  ? Not on file  ?Social History Narrative  ? Not on file  ? ?Social Determinants of Health  ? ?Financial Resource Strain: Not on file  ?Food Insecurity: Not on file  ?Transportation Needs: Not on file  ?Physical Activity: Not on file  ?Stress: Not on file  ?Social Connections: Not on file  ?Intimate Partner Violence: Not on file  ? ? ?Tracie [name]'s family history is not on file. ? ? ?   ?Objective:  ?  ?Vitals:  ? 07/22/21 1056  ?BP: 122/76  ?Pulse: 95  ?SpO2: 99%  ? ? ?Physical Exam Well-developed well-nourished  hispanic female  in no acute distress.  Accompanied by Interpreter [phone]  BMI 28 ? ?HEENT; nontraumatic normocephalic, EOMI, PE R LA, sclera anicteric. ?Oropharynx;not examined today  ?Neck; supple, no JVD ?Cardiovascular; regular rate and rhythm with S1-S2, no murmur rub or gallop ?Pulmonary; Clear bilaterally ?Abdomen; soft, nontender, nondistended, no palpable mass or hepatosplenomegaly, bowel sounds are active ?Rectal;not done ?Skin; benign exam, no jaundice rash or appreciable lesions ?Extremities; no clubbing cyanosis or edema skin warm and dry ?Neuro/Psych; alert and oriented x4, grossly nonfocal mood and affect appropriate  ? ? ? ?   ?Assessment & Plan:  ? ?#39 41 year old Hispanic female/non-English-speaking who had presented with some dyspeptic symptoms. ?Negative EGD, doing well on low-dose omeprazole.  No issues with nausea currently ?#2 asymptomatic cholelithiasis-current symptoms referable to gallstones. ?#3 alternating bowel habits and occasional mild left upper quadrant discomfort-negative colonoscopy with suction of a few sigmoid diverticuli, random biopsies negative for microscopic colitis.  Symptoms consistent with IBS ? ?#4 anxiety/depression ? ?Plan; continue omeprazole 20 mg p.o.  every morning, refill sent ?She was given a printed prescription for Levsin 0.125 tablets today 1 p.o. twice daily to 3 times daily to use as needed for abdominal discomfort/diarrhea ?Continue daily fiber supplement ?We discussed finding of internal hemorrhoids at colonoscopy, she is not having any ongoing issues with rectal bleeding.  I do not think she needs any treatment at present.  She was advised to call should she have any issues in the future with more regular episodes of hematochezia. ? ?We will plan to see her in 1 year or sooner as needed ? ?Artemis Loyal S Suriah Peragine PA-C ?07/22/2021 ? ? ?Cc: Claiborne Rigg, NP ?  ?

## 2021-07-26 NOTE — Progress Notes (Signed)
____________________________________________________________ ? ?Attending physician addendum: ? ?Thank you for sending this case to me. ?I have reviewed the entire note and agree with the plan. ? ?If she should have escalating upper abdominal pain, then reconsideration of surgical consultation for gallstones would be in order. ? ?Wilfrid Lund, MD ? ?____________________________________________________________ ? ?

## 2021-07-30 ENCOUNTER — Other Ambulatory Visit: Payer: Self-pay

## 2021-07-30 ENCOUNTER — Other Ambulatory Visit: Payer: Self-pay | Admitting: Nurse Practitioner

## 2021-07-30 DIAGNOSIS — F419 Anxiety disorder, unspecified: Secondary | ICD-10-CM

## 2021-07-30 MED ORDER — HYDROXYZINE HCL 25 MG PO TABS
25.0000 mg | ORAL_TABLET | Freq: Three times a day (TID) | ORAL | 2 refills | Status: DC | PRN
  Filled 2021-07-30 – 2021-08-13 (×2): qty 60, 20d supply, fill #0

## 2021-08-04 ENCOUNTER — Other Ambulatory Visit: Payer: Self-pay

## 2021-08-06 ENCOUNTER — Other Ambulatory Visit: Payer: Self-pay

## 2021-08-13 ENCOUNTER — Other Ambulatory Visit: Payer: Self-pay

## 2021-08-18 ENCOUNTER — Encounter: Payer: Self-pay | Admitting: Physician Assistant

## 2021-08-18 ENCOUNTER — Ambulatory Visit: Payer: Self-pay | Attending: Physician Assistant | Admitting: Physician Assistant

## 2021-08-18 VITALS — BP 117/79 | HR 99 | Wt 165.8 lb

## 2021-08-18 DIAGNOSIS — Z789 Other specified health status: Secondary | ICD-10-CM

## 2021-08-18 DIAGNOSIS — F411 Generalized anxiety disorder: Secondary | ICD-10-CM

## 2021-08-18 DIAGNOSIS — K8020 Calculus of gallbladder without cholecystitis without obstruction: Secondary | ICD-10-CM

## 2021-08-18 NOTE — Patient Instructions (Addendum)
Sndrome premenstrual Premenstrual Syndrome El sndrome premenstrual (SPM) es un conjunto de sntomas fsicos, emocionales y de comportamiento que afectan a las mujeres como parte de su ciclo menstrual. El sndrome premenstrual ocurre 1 o 2 semanas antes del inicio del perodo menstrual y desaparece unos das despus de que comienza el sangrado menstrual. El sndrome premenstrual puede variar de leve a grave. Cules son las causas? La causa exacta de esta afeccin se desconoce, pero parece estar relacionada con cambios hormonales que ocurren antes de Hydrographic surveyor. Cules son los signos o sntomas? Los sntomas de esta afeccin suelen presentarse todos los meses. Desaparecen despus de que comienza el perodo. Los sntomas fsicos de esta afeccin incluyen: Distensin. Dolor o sensibilidad en las mamas. Dolores de [place]. Fatiga extrema. Dolor de espalda. Ray[place] y los pies. Aumento de [place]. Acaloramiento. Los sntomas emocionales de esta afeccin incluyen: Cambios en el estado de nimo. Depresin. Arrebatos de ira u hostilidad. Irritabilidad. Ansiedad. Ataques de llanto. Los sntomas conductuales incluyen: Antojos de comida o cambios del apetito. Cambios en el deseo sexual. Confusin. Aislamiento social. Falta de concentracin. Cmo se diagnostica? Esta afeccin puede diagnosticarse en funcin de los antecedentes de sntomas. Por lo general, esta afeccin se diagnostica si los sntomas del sndrome premenstrual: Estn presentes en los 5 das previos al inicio del perodo. Finalizan 4 das despus de que comienza el perodo. Suceden al menos 3 meses seguidos. Interfieren con algunas de sus actividades normales. Se deben descartar otras afecciones que pueden causar algunos de estos sntomas antes de que se diagnostique el sndrome premenstrual. Entre ellas, depresin, ansiedad, anemia y problemas de tiroides. Cmo se trata? Esta afeccin puede tratarse  mediante: El mantenimiento de estilo de vida saludable. Esto puede incluir seguir una dieta bien equilibrada y realizar actividad fsica de forma regular. El uso de medicamentos de venta libre que pueden ayudar a Public house manager sntomas, como clicos, [place], [place], dolores de [place] y sensibilidad en las Faith. Siga estas instrucciones en su casa: Comida y bebida  Mantenga una dieta bien balanceada. Evite la cafena y el alcohol. Limite la cantidad de sal y de alimentos con sal que consume. Esto ayudar a Forensic psychologist. Beba suficiente lquido como para Theatre manager la orina de color amarillo plido. Tome un suplemento multivitamnico si se lo indic el mdico. Estilo de vida  No consuma ningn producto que contenga nicotina o tabaco. Estos productos incluyen cigarrillos, tabaco para Higher education careers adviser y aparatos de vapeo, como los Psychologist, sport and exercise. Si necesita ayuda para dejar de consumir estos productos, consulte al mdico. Haga actividad fsica de forma regular como se lo haya sugerido el mdico. Duerma lo suficiente. La mayora de los adultos deberan dormir entre 7 y 8 horas cada noche. Practique tcnicas de relajacin tales como yoga, tai chi o meditacin. Busque formas saludables de Scientific laboratory technician. Indicaciones generales  Durante 2 o 3 meses escriba sus sntomas, si son de leves a graves, y cunto duran. Esto ayuda al mdico a determinar el mejor tratamiento para usted. Use los medicamentos de venta libre y los recetados solamente como se lo haya indicado el mdico. Si [place] pldoras anticonceptivas (anticonceptivos orales), tmelas como se lo haya indicado el mdico. Comunquese con un mdico si: Sus sntomas empeoran. Tiene nuevos sntomas. Tiene dificultad para [employer] cotidianas. Resumen El sndrome premenstrual (SPM) es un conjunto de sntomas fsicos, emocionales y de comportamiento que afectan a las mujeres como parte de su ciclo menstrual. El sndrome  premenstrual comienza 1 o 2 semanas antes  del inicio del perodo y desaparece unos das despus de que comienza el perodo. El sndrome premenstrual se trata manteniendo un estilo de vida saludable y tomando medicamentos para Public house manager los sntomas. Esta informacin no tiene Marine scientist el consejo del mdico. Asegrese de hacerle al mdico cualquier pregunta que tenga. Document Revised: 01/09/2020 Document Reviewed: 11/22/2019 Elsevier Patient Education  [place] Cholelithiasis  La colelitiasis ocurre cuando se forman clculos biliares en la vescula biliar. La vescula biliar almacena bilis. La bilis es un lquido que ayuda a [employer]. La bilis puede endurecerse y transformarse en clculos biliares. Si estos causan una obstruccin, Doctor, hospital (ataque de vescula biliar). Cules son las causas? Esta afeccin puede ser causada por lo siguiente: Algunas enfermedades de la sangre, como la anemia drepanoctica. Demasiada cantidad de una sustancia parecida a la grasa (colesterol) en la bilis. No tener suficiente cantidad de sales biliares en la bilis. Estas sales le ayudan al organismo a Tax adviser y a Chief Financial Officer. La vescula biliar no se vaca completamente o con suficiente frecuencia. Esto es frecuente en las mujeres embarazadas. Qu incrementa el riesgo? Los siguientes factores pueden hacer que sea ms propenso a Emergency planning/management officer afeccin: Ser mujer. Estar embarazada muchas veces. Comer muchos alimentos fritos, grasas y carbohidratos refinados. Tener mucho sobrepeso (obesidad). Ser mayor de 32 aos de edad. Usar medicamentos con hormonas femeninas durante [employer]. Adelgazar rpidamente. Tener clculos biliares en la familia. Tener algunos problemas de [place], como diabetes, enfermedad de [place] o enfermedad heptica. Cules son los signos o sntomas? A menudo, puede haber clculos biliares, pero sin sntomas. Estos clculos biliares  se denominan clculos silenciosos. Si un clculo biliar provoca una obstruccin, puede sentir dolor repentino. El dolor: Puede estar en la parte superior derecha del vientre (abdomen). Normalmente aparece a la noche o despus de comer. Puede durar Ardelia Mems hora o ms. Se puede extender hacia el hombro derecho, la espalda o el pecho. Puede sentirse Pharmacist, community, ardor o sensacin de plenitud en la parte superior del vientre (indigestin). Si la obstruccin dura ms de algunas horas, puede tener una infeccin o hinchazn. Usted puede: Sentir que va a vomitar. Vomitar. Sentirse hinchado. Tener dolor en el vientre durante 5 horas o ms. Sentir dolor con la palpacin en el vientre, a menudo en la parte superior derecha y debajo de las [place]. Tener fiebre o escalofros. Notar que la piel o la zona blanca de los ojos se vuelven amarillas (ictericia). Tener el pis (orina) oscuro o las deposiciones (heces) plidas. Cmo se trata? El tratamiento de esta afeccin depende de qu tan mal se sienta. Si tiene sntomas, puede necesitar lo siguiente: Multimedia programmer, si los sntomas no son Orlene Erm graves. No coma durante 12 a 24 horas. Beba solamente agua y lquidos claros. Comience a comer alimentos simples o claros despus de 1 o 2 das. Pruebe con caldos y galletas. Es posible que necesite medicamentos para el dolor o Social research officer, government, o ambos. Si tiene una infeccin, necesitar antibiticos. Hospitalizacin, si tiene un dolor muy intenso o una infeccin muy grave. Ciruga para extirpar la vescula biliar. Puede ser necesario si: Los clculos biliares siguen apareciendo. Tiene sntomas muy graves. Medicamentos para destruir los clculos biliares. Medicamentos: Son mejores para los clculos pequeos. Pueden usarse durante hasta 6 a 12 meses. Un procedimiento para encontrar y extraer los clculos biliares o para fragmentarlos. Siga estas instrucciones en su casa: Medicamentos Use los  medicamentos de venta libre y los recetados  solamente como se lo haya indicado el mdico. Si le recetaron un antibitico, tmelo como se lo haya indicado el mdico. No deje de tomar el antibitico aunque comience a sentirse mejor. Pregntele al mdico si el medicamento recetado le impide conducir o usar [place]. Comida y bebida Beba suficiente lquido como para Theatre manager la orina de color amarillo plido. Beba agua o lquidos claros. Esto es importante cuando Tree surgeon. Consuma alimentos saludables. Elija: Menos alimentos grasos, como las comidas fritas. Menos carbohidratos refinados. Evite los panes y los cereales muy procesados, como el pan blanco y el arroz blanco. Elija cereales integrales, como el pan integral y Dwyane Luo integral. Ms Fredderick [place]. Las [place], las frutas frescas y los frijoles son fuentes saludables. Instrucciones generales Mantenga un peso saludable. Concurra a todas las visitas de seguimiento como se lo haya indicado el mdico. Esto es importante. Dnde buscar ms informacin [employer] of Diabetes and Digestive and Kidney Diseases ([place] Diabetes y las Enfermedades Digestivas y Renales): [email or web address] Comunquese con un mdico si: Siente dolor repentino en el costado superior derecho del vientre. El dolor podra extenderse hasta el hombro derecho, la espalda o el pecho. Le han diagnosticado clculos biliares que no presentan sntomas y tiene lo siguiente: Dolor abdominal. Molestias, ardor o sensacin de plenitud en la parte superior del abdomen. La orina es de color oscuro o tiene heces plidas. Solicite ayuda de inmediato si: Tiene dolor repentino en la parte superior derecha del abdomen y el dolor dura ms de 2 horas. Tiene dolor en el abdomen y: Dura ms de 5 horas. Sigue empeorando. Tiene fiebre o escalofros. Tiene ganas continuas de vomitar. Sigue vomitando. La piel y la parte blanca de los ojos se ponen  amarillos. Resumen La colelitiasis ocurre cuando se forman clculos biliares en la vescula biliar. La causa de esta afeccin puede ser Ardelia Mems enfermedad de la [place], una cantidad excesiva de una sustancia parecida a la grasa en la bilis o una cantidad insuficiente de sales biliares. El tratamiento de esta afeccin depende de qu tan mal se sienta. Si tiene sntomas, no coma ni beba. Es posible que necesite tomar medicamentos. Tal vez necesite hospitalizacin si tiene un dolor muy intenso o una infeccin muy grave. Es posible que deba someterse a una ciruga si los clculos siguen apareciendo o si tiene sntomas muy graves. Esta informacin no tiene Marine scientist el consejo del mdico. Asegrese de hacerle al mdico cualquier pregunta que tenga. Document Revised: 04/12/2019 Document Reviewed: 04/12/2019 Elsevier Patient Education  Natalbany hgado graso Fatty Liver Disease  El hgado transforma los alimentos en energa, elimina las sustancias txicas de la [place], fabrica protenas importantes y absorbe las vitaminas necesarias de los alimentos. La enfermedad del hgado graso ocurre cuando se acumula demasiada grasa en las clulas del hgado. La enfermedad del hgado graso tambin se llama esteatosis heptica. En muchos casos, la enfermedad del hgado graso no provoca sntomas ni problemas. Con frecuencia, se diagnostica cuando se realizan estudios por otros motivos. Sin embargo, con el [place], el hgado graso puede provocar una inflamacin que posiblemente cause problemas hepticos ms graves, como la fibrosis heptica (cirrosis) o insuficiencia heptica. El hgado graso se asocia con la resistencia a la insulina, el aumento de la grasa corporal, la presin arterial alta (hipertensin) y el colesterol elevado. Estas son caractersticas del sndrome metablico y [place] el riesgo de accidente cerebrovascular, diabetes y enfermedad cardaca. Cules son las causas? Esta  afeccin puede estar causada por componentes  del sndrome metablico: Obesidad. Resistencia a la insulina. Colesterol alto. Otras causas: Consumo excesivo de alcohol. Nutricin deficiente. Sndrome de [place]. Embarazo. Determinados medicamentos. Txicos. Algunas infecciones virales. Qu incrementa el riesgo? Es ms probable que tengan esta afeccin las personas que: Consumen alcohol en exceso. Tienen sobrepeso. Tienen diabetes. Tienen hepatitis. Tienen un nivel alto de triglicridos. Estn embarazadas. Cules son los signos o sntomas? Con frecuencia, la enfermedad del hgado graso no provoca sntomas. Si se desarrollan sntomas, estos pueden incluir: Fatiga y debilidad. Prdida de peso. Confusin. Nuseas, vmitos o dolor abdominal. Color amarillo en la piel y en la zona blanca de los ojos (ictericia). Picazn en la piel. Cmo se diagnostica? Este trastorno puede diagnosticarse mediante: Un examen fsico y los antecedentes mdicos. Anlisis de [place]. Estudios de diagnstico por imgenes, como ecografa, exploracin por tomografa computarizada (TC) o Health visitor (RM). Biopsia de hgado. Se extrae una pequea muestra de tejido del hgado usando [place]. La muestra se examina en el microscopio. Cmo se trata? Con frecuencia, la enfermedad del hgado graso es causada por otras afecciones. El tratamiento para el hgado graso puede incluir medicamentos y cambios en el estilo de vida para controlar enfermedades como: Alcoholismo. Colesterol alto. Diabetes. Sobrepeso u obesidad. Siga estas instrucciones en su casa:  No beba alcohol. Si tiene problemas para dejar de beber, consulte al mdico cmo puede dejar de beber de forma segura con la ayuda de medicamentos o un programa con supervisin. Esto es importante para evitar que la afeccin empeore. Siga una dieta saludable como se lo haya indicado el mdico. Consulte al mdico sobre trabajar con un nutricionista  para Animal nutritionist de alimentacin. Haga ejercicio regularmente. Esto puede ayudarlo a [employer], y a Academic librarian y la diabetes. Hable con el mdico sobre qu actividades son mejores para usted y Audiological scientist de [place]. Use los medicamentos de venta libre y los recetados solamente como se lo haya indicado el mdico. Cumpla con todas las visitas de seguimiento. Esto es importante. Comunquese con un mdico si: Tiene dificultad para controlar lo siguiente: Nivel de Dispensing optician. Esto es muy importante si tiene diabetes. Colesterol. El consumo de alcohol. Solicite ayuda de inmediato si: Siente dolor abdominal. Tiene ictericia. Tiene nuseas y vomita. Vomita sangre o una sustancia similar al poso del caf. Las heces son negras, alquitranadas o sanguinolentas. Resumen La enfermedad del hgado graso se desarrolla cuando se acumula demasiada grasa en las clulas del hgado. Con frecuencia, la enfermedad del hgado no causa sntomas ni problemas. Sin embargo, con Physiological scientist, el hgado graso puede provocar una inflamacin que puede causar problemas hepticos ms graves, como fibrosis heptica (cirrosis). Es ms probable que desarrolle esta afeccin si consume alcohol en exceso, est embarazada, tiene sobrepeso, diabetes, hepatitis o altos niveles de triglicridos o de colesterol. Comunquese con el mdico si tiene problemas para controlar su azcar en la sangre, el colesterol o el consumo de alcohol. Esta informacin no tiene Marine scientist el consejo del mdico. Asegrese de hacerle al mdico cualquier pregunta que tenga. Document Revised: 01/14/2020 Document Reviewed: 01/14/2020 Elsevier Patient Education  [place].

## 2021-08-18 NOTE — Progress Notes (Signed)
Tracie [name], is a 41 y.o. female  GQB:[id number]  EKC:[id number]  DOB - 1980-07-01  Chief Complaint  Patient presents with   Abdominal Pain       Subjective:   Tracie [name] is a 41 y.o. female here today for referral to General surgery for assessing gallstones.  She continues to have nausea and RUQ pain that is intermittent.  No new or sudden increase in pain.  She has seen GI and placed on omeprazole.  She was prescribed buspar 15mg  bid for anxiety and depression and isn't sure if she is supposed to take this every day vs prn.  We ended up discussing this quite in depth.  She was tried on zoloft previously but not sure is she took it long enough to help.  She says the majority of her anxiety and depression occurs about 1 week prior to her period and a few days into her cycle.  She usu feels fine the rest of the month.  Supportive husband.  She has a 26 year old child.    No problems updated.  ALLERGIES: No Known Allergies  PAST MEDICAL HISTORY: Past Medical History:  Diagnosis Date   Anxiety    Depression    Headache     MEDICATIONS AT HOME: Prior to Admission medications   Medication Sig Start Date End Date Taking? Authorizing Provider  hydrOXYzine (ATARAX) 25 MG tablet Take 1 tablet (25 mg total) by mouth 3 (three) times daily as needed for anxiety. 07/30/21   09/29/21, NP  hyoscyamine (LEVSIN SL) 0.125 MG SL tablet Take 1 tablet by mouth 2-3 times daily for abdominal cramps and diarrhea 07/22/21   Esterwood, Amy S, PA-C  omeprazole (PRILOSEC) 20 MG capsule Take 1 capsule (20 mg total) by mouth daily. Patient taking differently: Take 20 mg by mouth daily as needed. 08/12/20   08/14/20, PA-C  ondansetron (ZOFRAN) 4 MG tablet Take 1 tablet (4 mg total) by mouth every 8 (eight) hours as needed for nausea or vomiting. 07/01/21   07/03/21, MD  topiramate (TOPAMAX) 25 MG tablet Take 1 tablet (25 mg total) by mouth daily. 05/26/21 08/24/21   10/24/21, NP    ROS: Neg HEENT Neg resp Neg cardiac Neg GI Neg GU Neg MS Neg psych Neg neuro  Objective:   Vitals:   08/18/21 1103  BP: 117/79  Pulse: 99  SpO2: 100%  Weight: 165 lb 12.8 oz (75.2 kg)   Exam General appearance : Awake, alert, not in any distress. Speech Clear. Not toxic looking HEENT: Atraumatic and Normocephalic Neck: Supple, no JVD. No cervical lymphadenopathy.  Chest: Good air entry bilaterally, CTAB.  No rales/rhonchi/wheezing CVS: S1 S2 regular, no murmurs.  Extremities: B/L Lower Ext shows no edema, both legs are warm to touch Neurology: Awake alert, and oriented X 3, CN II-XII intact, Non focal Skin: No Rash  Data Review Lab Results  Component Value Date   HGBA1C 5.6 05/26/2021   HGBA1C 5.5 12/18/2019   HGBA1C 5.6 10/23/2015    Assessment & Plan   1. Gallstones Identified on U/S 05/2021 - Ambulatory referral to General Surgery  2. Generalized anxiety disorder We have discussed taking the buspar 15mg  bid daily  about 1 week prior to her period and a few days into menstruation.  Use hydroxyzine prn as directed.  She is about to start her period.  I will have her do this for upcoming period and her next period and then  see me again and we will trouble shoot from there  3. Language barrier [employer] interpreters "Maralyn Sago" used and additional time performing visit was required.     Return for July 5 at 10:50 with Georgian Co.  The patient was given clear instructions to go to ER or return to medical center if symptoms don't improve, worsen or new problems develop. The patient verbalized understanding. The patient was told to call to get lab results if they haven't heard anything in the next week.      Georgian Co, PA-C [facility] [place], [place] [phone]   08/18/2021, 12:20 PM Patient ID: Tracie [name], female   DOB: Feb 03, 1981, 41 y.o.   MRN: [id number]

## 2021-09-22 ENCOUNTER — Encounter: Payer: Self-pay | Admitting: Physician Assistant

## 2021-09-22 ENCOUNTER — Ambulatory Visit: Payer: Self-pay | Attending: Physician Assistant | Admitting: Physician Assistant

## 2021-09-22 ENCOUNTER — Other Ambulatory Visit: Payer: Self-pay

## 2021-09-22 VITALS — BP 114/77 | HR 77 | Wt 167.4 lb

## 2021-09-22 DIAGNOSIS — F419 Anxiety disorder, unspecified: Secondary | ICD-10-CM

## 2021-09-22 DIAGNOSIS — M62838 Other muscle spasm: Secondary | ICD-10-CM

## 2021-09-22 DIAGNOSIS — Z789 Other specified health status: Secondary | ICD-10-CM

## 2021-09-22 MED ORDER — HYDROXYZINE HCL 25 MG PO TABS
ORAL_TABLET | ORAL | 2 refills | Status: DC
  Filled 2021-09-22: qty 60, 20d supply, fill #0
  Filled 2022-03-24 – 2022-05-02 (×4): qty 60, 20d supply, fill #1

## 2021-09-22 MED ORDER — SERTRALINE HCL 50 MG PO TABS
ORAL_TABLET | ORAL | 3 refills | Status: DC
  Filled 2021-09-22: qty 30, 30d supply, fill #0
  Filled 2021-12-06: qty 30, 30d supply, fill #1

## 2021-09-22 MED ORDER — METHOCARBAMOL 500 MG PO TABS
1000.0000 mg | ORAL_TABLET | Freq: Three times a day (TID) | ORAL | 1 refills | Status: DC | PRN
  Filled 2021-09-22: qty 90, 15d supply, fill #0

## 2021-09-22 NOTE — Patient Instructions (Signed)
Trastorno de ansiedad generalizada, en adultos Generalized Anxiety Disorder, Adult El trastorno de ansiedad generalizada (TAG) es una afeccin de salud mental. A diferencia de las preocupaciones normales, la ansiedad relacionada con el TAG no se desencadena por un acontecimiento especfico. Estas preocupaciones no desaparecen ni mejoran con el tiempo. EL TAG interfiere con las relaciones, el trabajo y [place]. Los sntomas del TAG pueden variar de leves a graves. Las personas con TAG grave pueden tener intensas oleadas de ansiedad con sntomas fsicos que son similares a las crisis de [place]. Cules son las causas? Se desconoce la causa exacta del TAG, pero se cree que influyen los siguientes factores: Diferencias en las sustancias qumicas naturales del cerebro. Genes que se transmiten de padres a hijos. Diferencias en la forma en que se perciben las amenazas. Desarrollo y Psychologist, forensic durante la infancia. La personalidad. Qu incrementa el riesgo? Los siguientes factores pueden hacer que sea ms propenso a Armed forces training and education officer afeccin: Ser mujer. Tener antecedentes familiares de trastornos de ansiedad. Ser muy tmido. Experimentar acontecimientos muy estresantes en la vida, como la muerte de un ser querido. Tener un Industrial/product designer. Cules son los signos o sntomas? Con frecuencia, las personas que padecen el TAG se preocupan excesivamente por muchas cosas en la vida, tales como su salud y [place]. Otros sntomas son: Heritage manager y emocionales: Preocupacin excesiva acerca de los desastres naturales. [place] a llegar tarde. Dificultad para concentrarse. Temor de que otras personas juzguen su desempeo. Sntomas fsicos: Fatiga. Dolores de cabeza, tensin muscular, contracciones musculares, temblores o sensacin de tambalearse. Sensacin de que el corazn late [place] rpido. Sentir falta de aire o como que no se puede respirar profundamente. Problemas para  conciliar el sueo o para seguir durmiendo, o sensacin de Air cabin crew. Sudoracin. Nuseas, diarrea o sndrome de colon irritable (SCI). Sntomas de la conducta: Tener estados de nimo cambiantes o irritabilidad. Evitar situaciones nuevas. Evitar a Public affairs consultant. Dificultad extrema para tomar decisiones. Cmo se diagnostica? Esta afeccin se diagnostica en funcin de los sntomas y los antecedentes mdicos. Adems, se le Chartered certified accountant examen fsico. El mdico puede hacerle algunas pruebas para descartar que sus sntomas tengan otras causas. Para recibir un diagnstico del TAG, una persona debe tener ansiedad que: Est fuera de su control. Afecte distintos aspectos de su vida, como el trabajo y [employer]. Cause angustia que le impida participar en sus actividades habituales. Incluya al menos tres sntomas del TAG, tales como desasosiego, fatiga, dificultad para concentrarse, irritabilidad, tensin muscular o problemas para dormir. Antes de que su mdico pueda confirmar el diagnstico de TAG, estos sntomas deben estar presentes ms [employer] no lo estn y deben tener una duracin de seis meses o ms. Cmo se trata? El tratamiento de esta afeccin puede incluir: Medicamentos. Por lo general, los medicamentos antidepresivos se recetan para un control diario a Barrister's clerk. Se pueden agregar medicamentos para la ansiedad en casos graves, especialmente cuando ocurren crisis de [place]. Psicoterapia (psicoanlisis). Determinados tipos de psicoterapia pueden ser tiles para tratar el TAG al brindar 57, educacin y orientacin. [place] opciones se incluyen las siguientes: Terapia cognitivo conductual (TCC). Las personas aprenden habilidades para afrontar situaciones y tcnicas para poder calmarse para aliviar sus sntomas fsicos. Aprenden a identificar comportamientos y pensamientos no realistas y a reemplazarlos por comportamientos y pensamientos ms adecuados. Terapia de aceptacin y  compromiso (acceptance and commitment therapy, ACT). Este tratamiento ensea a las personas a ser conscientes como una forma de lidiar con pensamientos y  sentimientos no deseados. Biorretroalimentacin. Este proceso lo capacita para Aeronautical engineer respuesta del cuerpo Insurance risk surveyor) a travs de tcnicas de respiracin y mtodos de relajacin. Usted trabajar con un terapeuta mientras se usan mquinas para controlar sus sntomas fsicos. Tcnicas para Scientific laboratory technician. Estas incluyen yoga, meditacin y ejercicio. Un especialista en salud mental puede ayudar a determinar qu tratamiento es el mejor para usted. Algunas Water quality scientist con solo un tipo de [place]. Sin embargo, Producer, television/film/video requieren una combinacin de terapias. Siga estas instrucciones en su casa: Estilo de vida Mantenga horarios y [place] rutina uniforme. [place] situaciones estresantes. Michelene Gardener un plan y reserve tiempo adicional para trabajar con su plan. Practique tcnicas de control del estrs o tcnicas para calmarse que su terapeuta o su mdico le haya enseado. Haga actividad fsica con frecuencia y pase tiempo al [employer]. Siga una dieta saludable que incluya abundantes frutas, verduras, cereales integrales, productos lcteos descremados y protenas magras. No consuma muchos alimentos con alto contenido de grasas, azcar agregado o sal (sodio). Beba abundante agua. Evite tomar alcohol. El alcohol puede aumentar la ansiedad. Evite el consumo de cafena y ciertos medicamentos de venta libre contra el resfro. Estos podran Engineer, building services. Pregntele al farmacutico qu medicamentos no debera tomar. Instrucciones generales Use los medicamentos de venta libre y los recetados solamente como se lo haya indicado el mdico. Comprenda que es probable que tenga retrocesos. Acepte esto y sea amable con usted mismo mientras contina cuidndose mejor. [place] situaciones estresantes. Michelene Gardener un plan y reserve  tiempo adicional para trabajar con su plan. Reconozca y acepte sus logros, aunque le parezcan pequeos. Pase tiempo con personas que se preocupan por usted. Concurra a [place]. Esto es importante. Dnde obtener ms informacin [place] ([place] Mental): https://carter.com/ Substance Abuse and [place] ([place] por [place] y [place]): ktimeonline.com Comunquese con un mdico si: Los sntomas no mejoran. Los sntomas empeoran. Tiene signos de depresin tales como: Un estado de nimo constantemente triste o irritable. Ya no disfruta de Engineering geologist. Cambios en el peso o en sus hbitos de alimentacin. Cambios en los hbitos de sueo. Solicite ayuda de inmediato si: Tiene pensamientos acerca de lastimarse a usted mismo o a Producer, television/film/video. Si alguna vez siente que puede lastimarse o Market researcher a Producer, television/film/video, o tiene pensamientos de poner fin a su vida, busque ayuda de inmediato. Dirjase al servicio de urgencias ms cercano o: Comunquese con el servicio de emergencias de su localidad (911 en los [place]). Llame a una lnea de asistencia al suicida y atencin en crisis como National Suicide Prevention Lifeline ([place]) al [phone]. Est disponible las 24 horas del da en los [place]. Enve un mensaje de texto a la lnea para casos de crisis al [phone] (en los [place] UU.). Resumen El trastorno de ansiedad generalizada (TAG) es una afeccin de salud mental que implica preocupacin no desencadenada por un acontecimiento especfico. Con frecuencia, las personas que padecen el TAG se preocupan excesivamente por muchas cosas en la vida, tales como su salud y [place]. El TAG puede causar sntomas tales como agitacin, dificultad para concentrarse, problemas para dormir, sudoracin frecuente, nuseas, diarrea, dolores de [place] y  temblores o contracciones musculares. Un especialista en salud mental puede ayudar a determinar qu tratamiento es el mejor para usted. Algunas Water quality scientist con solo un tipo de [place].  Sin embargo, Producer, television/film/video requieren una combinacin de terapias. Esta informacin no tiene Marine scientist el consejo del mdico. Asegrese de hacerle al mdico cualquier pregunta que tenga. Document Revised: 07/16/2020 Document Reviewed: 07/16/2020 Elsevier Patient Education  [place] Spasticity La espasticidad es una afeccin en la que los msculos se contraen de forma repentina e impredecible (espasmo). La espasticidad suele [employer], las piernas o la espalda. Tambin puede afectar la manera de caminar. La espasticidad se puede manifestar como una rigidez y Human resources officer de leve a grave, y espasmos musculares incontrolables. La espasticidad grave puede ser dolorosa y Technical brewer los msculos en una posicin incmoda. Siga estas instrucciones en su casa: Control de la rigidez muscular y los espasmos     Use un dispositivo ortopdico para prevenir las contracciones musculares, como se lo indic el mdico. Reciba masajes en los msculos afectados. Si se lo indican, aplique calor en la zona de los msculos afectados. Use la fuente de calor que el mdico le recomiende, como una compresa de calor hmedo o una almohadilla trmica. Coloque una toalla entre la piel y la fuente de Freight forwarder. Aplique calor durante 20 a 30 minutos. Retire la fuente de calor si la piel se pone de color rojo brillante. Esto es especialmente importante si no puede sentir dolor, calor o fro. Puede correr un riesgo mayor de sufrir quemaduras. Si se lo indican, aplique hielo en la zona de los msculos afectados: Ponga el hielo en una bolsa plstica. Coloque una toalla entre la piel y la bolsa o entre el dispositivo ortopdico y Therapist, nutritional. Coloque el hielo durante 20 minutos, 2 a 3 veces por  da. Actividad Mantngase activo como se lo haya indicado el mdico. Busque un programa de ejercicio seguro que se ajuste a sus necesidades y a su capacidad. Adopte una buena postura mientras est sentado y caminando. Trabaje con un fisioterapeuta para aprender ejercicios para elongar y [employer]. Haga ejercicios de estiramiento y de amplitud de movimiento en su casa, como se lo haya indicado el fisioterapeuta. Trabaje con un terapeuta ocupacional. Este tipo de mdico puede ayudarlo a funcionar Product manager y en el [place]. Si tiene espasticidad grave, use los dispositivos de ayuda para la movilidad, como un andador o un bastn, como se lo haya indicado el mdico. Instrucciones generales Controle su afeccin para Actuary cambio. Use ropa holgada y cmoda que no limite el movimiento. Use calzado [employer] dedos que le calce bien y le amortige los pies. Use calzado con suela de goma o taco bajo. Tome los medicamentos de venta libre y los recetados solamente como se lo haya indicado el mdico. Consulting civil engineer a todas las visitas de seguimiento como se lo haya indicado el mdico. Esto es importante. Comunquese con un mdico si: Experimenta un empeoramiento de los espasmos musculares. Manifiesta otros sntomas junto con la espasticidad. Tiene fiebre o escalofros. Tiene una sensacin de ardor al Garment/textile technologist. Tiene estreimiento. Necesita ms apoyo en su casa. Solicite ayuda de inmediato si: Tiene dificultad para respirar. Tiene un espasmo muscular que lo deja congelado en una posicin dolorosa. No puede caminar. No puede cuidarse a s mismo en su casa. Tienen dificultad para orinar o tiene incontinencia urinaria. Resumen La espasticidad es una afeccin en la que los msculos se contraen de forma repentina e impredecible (espasmo). La espasticidad suele [employer], las piernas o la espalda. La espasticidad se puede manifestar como una rigidez y Special educational needs teacher de leve  a grave, y espasmos musculares incontrolables. Haga ejercicios de estiramiento y de amplitud de movimiento en su casa, como se lo haya indicado el fisioterapeuta. Tome los medicamentos de venta libre y los recetados solamente como se lo haya indicado el mdico. Esta informacin no tiene Marine scientist el consejo del mdico. Asegrese de hacerle al mdico cualquier pregunta que tenga. Document Revised: 11/04/2020 Document Reviewed: 11/04/2020 Elsevier Patient Education  [place]. Sndrome premenstrual Premenstrual Syndrome El sndrome premenstrual (SPM) es un conjunto de sntomas fsicos, emocionales y de comportamiento que afectan a las mujeres como parte de su ciclo menstrual. El sndrome premenstrual ocurre 1 o 2 semanas antes del inicio del perodo menstrual y desaparece unos das despus de que comienza el sangrado menstrual. El sndrome premenstrual puede variar de leve a grave. Cules son las causas? La causa exacta de esta afeccin se desconoce, pero parece estar relacionada con cambios hormonales que ocurren antes de Hydrographic surveyor. Cules son los signos o sntomas? Los sntomas de esta afeccin suelen presentarse todos los meses. Desaparecen despus de que comienza el perodo. Los sntomas fsicos de esta afeccin incluyen: Distensin. Dolor o sensibilidad en las mamas. Dolores de [place]. Fatiga extrema. Dolor de espalda. [place] y los pies. Aumento de [place]. A[place]or[place]. Los sntomas emocionales de esta afeccin incluyen: Cambios en el estado de nimo. Depresin. Arrebatos de ira u hostilidad. Irritabilidad. Ansiedad. Ataques de llanto. Los sntomas conductuales incluyen: Antojos de comida o cambios del apetito. Cambios en el deseo sexual. Confusin. Aislamiento social. Falta de concentracin. Cmo se diagnostica? Esta afeccin puede diagnosticarse en funcin de los antecedentes de sntomas. Por lo general, esta afeccin  se diagnostica si los sntomas del sndrome premenstrual: Estn presentes en los 5 das previos al inicio del perodo. Finalizan 4 das despus de que comienza el perodo. Suceden al menos 3 meses seguidos. Interfieren con algunas de sus actividades normales. Se deben descartar otras afecciones que pueden causar algunos de estos sntomas antes de que se diagnostique el sndrome premenstrual. Entre ellas, depresin, ansiedad, anemia y problemas de tiroides. Cmo se trata? Esta afeccin puede tratarse mediante: El mantenimiento de estilo de vida saludable. Esto puede incluir seguir una dieta bien equilibrada y realizar actividad fsica de forma regular. El uso de medicamentos de venta libre que pueden ayudar a Public house manager sntomas, como clicos, [place], [place], dolores de [place] y sensibilidad en las [place]. Siga estas instrucciones en su casa: Comida y bebida  Mantenga una dieta bien balanceada. Evite la cafena y el alcohol. Limite la cantidad de sal y de alimentos con sal que consume. Esto ayudar a Forensic psychologist. Beba suficiente lquido como para Theatre manager la orina de color amarillo plido. Tome un suplemento multivitamnico si se lo indic el mdico. Estilo de vida  No consuma ningn producto que contenga nicotina o tabaco. Estos productos incluyen cigarrillos, tabaco para Higher education careers adviser y aparatos de vapeo, como los Psychologist, sport and exercise. Si necesita ayuda para dejar de consumir estos productos, consulte al mdico. Haga actividad fsica de forma regular como se lo haya sugerido el mdico. [place] lo suficiente. La mayora de los adultos deberan dormir entre 7 y 8 horas cada noche. Practique tcnicas de relajacin tales como yoga, tai chi o meditacin. Busque formas saludables de Scientific laboratory technician. Indicaciones generales  Durante 2 o 3 meses escriba sus sntomas, si son de leves a graves, y cunto duran. Esto ayuda al mdico a determinar el mejor tratamiento para usted. Use los  medicamentos de venta libre y los recetados  solamente como se lo haya indicado el mdico. Si [place] pldoras anticonceptivas (anticonceptivos orales), tmelas como se lo haya indicado el mdico. Comunquese con un mdico si: Sus sntomas empeoran. Tiene nuevos sntomas. Tiene dificultad para [employer] cotidianas. Resumen El sndrome premenstrual (SPM) es un conjunto de sntomas fsicos, emocionales y de comportamiento que afectan a las mujeres como parte de su ciclo menstrual. El sndrome premenstrual comienza 1 o 2 semanas antes del inicio del perodo y desaparece unos das despus de que comienza el perodo. El sndrome premenstrual se trata manteniendo un estilo de vida saludable y tomando medicamentos para Public house manager los sntomas. Esta informacin no tiene Marine scientist el consejo del mdico. Asegrese de hacerle al mdico cualquier pregunta que tenga. Document Revised: 01/09/2020 Document Reviewed: 11/22/2019 Elsevier Patient Education  [place].

## 2021-09-22 NOTE — Progress Notes (Signed)
Patient ID: Tracie [name], female   DOB: 1981-03-08, 41 y.o.   MRN: [id number]   Tracie [name], is a 41 y.o. female  [id number]  [id number]  DOB - 12-Nov-1980  No chief complaint on file.      Subjective:   Tracie [name] is a 41 y.o. female here today for a follow up visit after seeing me about 1 months ago and started on buspar for PMDD just prior to menses.  It did not help but she is open to trying something else.    Needs RF on muscle relaxer and on prn hydroxyzine which does help.     No problems updated.  ALLERGIES: No Known Allergies  PAST MEDICAL HISTORY: Past Medical History:  Diagnosis Date   Anxiety    Depression    Headache     MEDICATIONS AT HOME: Prior to Admission medications   Medication Sig Start Date End Date Taking? Authorizing Provider  methocarbamol (ROBAXIN) 500 MG tablet Take 2 tablets (1,000 mg total) by mouth every 8 (eight) hours as needed for muscle spasms. 09/22/21  Yes Georgian Co M, PA-C  sertraline (ZOLOFT) 50 MG tablet Take 1/2 tab daily for 4 days then 1 daily thereafter 09/22/21  Yes Anders Simmonds, PA-C  hydrOXYzine (ATARAX) 25 MG tablet take 1/2 to 1 tablet as needed for anxiety every 8hrs 09/22/21   Anders Simmonds, PA-C  hyoscyamine (LEVSIN SL) 0.125 MG SL tablet Take 1 tablet by mouth 2-3 times daily for abdominal cramps and diarrhea Patient not taking: Reported on 09/22/2021 07/22/21   Esterwood, Amy S, PA-C  omeprazole (PRILOSEC) 20 MG capsule Take 1 capsule (20 mg total) by mouth daily. Patient not taking: Reported on 09/22/2021 08/12/20   Anders Simmonds, PA-C  ondansetron (ZOFRAN) 4 MG tablet Take 1 tablet (4 mg total) by mouth every 8 (eight) hours as needed for nausea or vomiting. Patient not taking: Reported on 09/22/2021 07/01/21   Sherrilyn Rist, MD  topiramate (TOPAMAX) 25 MG tablet Take 1 tablet (25 mg total) by mouth daily. 05/26/21 08/24/21  Claiborne Rigg, NP    ROS: Neg HEENT Neg resp Neg  cardiac Neg GI Neg GU Neg MS Neg neuro  Objective:   Vitals:   09/22/21 1103  BP: 114/77  Pulse: 77  SpO2: 100%  Weight: 167 lb 6.4 oz (75.9 kg)   Exam General appearance : Awake, alert, not in any distress. Speech Clear. Not toxic looking HEENT: Atraumatic and Normocephalic Neck: Supple, no JVD. No cervical lymphadenopathy.  Chest: Good air entry bilaterally, CTAB.  No rales/rhonchi/wheezing CVS: S1 S2 regular, no murmurs.  Extremities: B/L Lower Ext shows no edema, both legs are warm to touch Neurology: Awake alert, and oriented X 3, CN II-XII intact, Non focal Skin: No Rash  Data Review Lab Results  Component Value Date   HGBA1C 5.6 05/26/2021   HGBA1C 5.5 12/18/2019   HGBA1C 5.6 10/23/2015    Assessment & Plan   1. Anxiety She would like to try something daily at this point and time.  (Not only PMDD). She has agreed to take this daily and we will see if her symptoms are improving on a steady dose.   - sertraline (ZOLOFT) 50 MG tablet; Take 1/2 tab daily for 4 days then 1 daily thereafter  Dispense: 30 tablet; Refill: 3 - hydrOXYzine (ATARAX) 25 MG tablet; take 1/2 to 1 tablet as needed for anxiety every 8hrs  Dispense: 60 tablet; Refill: 2  2. Muscle spasm  Try daily yoga/stretching(even just 10 mins/day) - methocarbamol (ROBAXIN) 500 MG tablet; Take 2 tablets (1,000 mg total) by mouth every 8 (eight) hours as needed for muscle spasms.  Dispense: 90 tablet; Refill: 1  3. Language barrier [employer] interpreters  "De Nurse" used and additional time performing visit was required.   Return in about 2 months (around 11/23/2021) for 2 months WITH ME for medication trouble shooting.  The patient was given clear instructions to go to ER or return to medical center if symptoms don't improve, worsen or new problems develop. The patient verbalized understanding. The patient was told to call to get lab results if they haven't heard anything in the next week.      Georgian Co,  PA-C [facility] and [facility] [place], [place] [phone]   09/22/2021, 11:45 AM

## 2021-12-06 ENCOUNTER — Other Ambulatory Visit: Payer: Self-pay

## 2021-12-09 ENCOUNTER — Other Ambulatory Visit: Payer: Self-pay

## 2021-12-09 ENCOUNTER — Encounter: Payer: Self-pay | Admitting: Physician Assistant

## 2021-12-09 ENCOUNTER — Ambulatory Visit (INDEPENDENT_AMBULATORY_CARE_PROVIDER_SITE_OTHER): Payer: Self-pay | Admitting: Physician Assistant

## 2021-12-09 VITALS — BP 118/79 | HR 97 | Temp 98.1°F | Resp 16 | Wt 169.0 lb

## 2021-12-09 DIAGNOSIS — Z789 Other specified health status: Secondary | ICD-10-CM

## 2021-12-09 DIAGNOSIS — F419 Anxiety disorder, unspecified: Secondary | ICD-10-CM

## 2021-12-09 DIAGNOSIS — F32A Depression, unspecified: Secondary | ICD-10-CM

## 2021-12-09 MED ORDER — SERTRALINE HCL 100 MG PO TABS
100.0000 mg | ORAL_TABLET | Freq: Every day | ORAL | 1 refills | Status: DC
  Filled 2021-12-09 – 2021-12-15 (×2): qty 90, 90d supply, fill #0
  Filled 2022-03-24: qty 90, 90d supply, fill #1

## 2021-12-09 NOTE — Progress Notes (Signed)
Patient ID: Tracie [name], female   DOB: 1981/01/07, 41 y.o.   MRN: [id number]    Tracie [name], is a 41 y.o. female  [id number]  [id number]  DOB - [birth year]  Chief Complaint  Patient presents with   Medication Refill       Subjective:   Tracie [name] is a 41 y.o. female here today for a recheck of zoloft.  She took 25 mg daily for 1 month then started 50mg  about 2 weeks ago.  She is doing great.  She is feeling much more like herself.  She wants to do activities now.  She is sleeping better.  Mood is stabile/less labile.     No problems updated.  ALLERGIES: No Known Allergies  PAST MEDICAL HISTORY: Past Medical History:  Diagnosis Date   Anxiety    Depression    Headache     MEDICATIONS AT HOME: Prior to Admission medications   Medication Sig Start Date End Date Taking? Authorizing Provider  hydrOXYzine (ATARAX) 25 MG tablet Take 1/2 to 1 tablet by mouth every 8 hours  as needed for anxiety 09/22/21  Yes Nahara Dona M, PA-C  methocarbamol (ROBAXIN) 500 MG tablet Take 2 tablets (1,000 mg total) by mouth every 8 (eight) hours as needed for muscle spasms. 09/22/21  Yes Freeman Caldron M, PA-C  sertraline (ZOLOFT) 100 MG tablet Take 1 tablet (100 mg total) by mouth daily. 12/09/21  Yes Harrington Jobe M, PA-C  hyoscyamine (LEVSIN SL) 0.125 MG SL tablet Take 1 tablet by mouth 2-3 times daily for abdominal cramps and diarrhea Patient not taking: Reported on 09/22/2021 07/22/21   Esterwood, Amy S, PA-C  omeprazole (PRILOSEC) 20 MG capsule Take 1 capsule (20 mg total) by mouth daily. Patient not taking: Reported on 09/22/2021 08/12/20   Argentina Donovan, PA-C  ondansetron (ZOFRAN) 4 MG tablet Take 1 tablet (4 mg total) by mouth every 8 (eight) hours as needed for nausea or vomiting. Patient not taking: Reported on 09/22/2021 07/01/21   Doran Stabler, MD  topiramate (TOPAMAX) 25 MG tablet Take 1 tablet (25 mg total) by mouth daily. 05/26/21 08/24/21  Gildardo Pounds, NP    ROS: Neg HEENT Neg resp Neg cardiac Neg GI Neg GU Neg MS Neg psych Neg neuro  Objective:   Vitals:   12/09/21 1536  BP: 118/79  Pulse: 97  Resp: 16  Temp: 98.1 F (36.7 C)  TempSrc: Oral  SpO2: 98%  Weight: 169 lb (76.7 kg)   Exam General appearance : Awake, alert, not in any distress. Speech Clear. Not toxic looking HEENT: Atraumatic and Normocephalic Neck: Supple, no JVD. No cervical lymphadenopathy.  Chest: Good air entry bilaterally, CTAB.  No rales/rhonchi/wheezing CVS: S1 S2 regular, no murmurs.  Extremities: B/L Lower Ext shows no edema, both legs are warm to touch Neurology: Awake alert, and oriented X 3, CN II-XII intact, Non focal Skin: No Rash  Data Review Lab Results  Component Value Date   HGBA1C 5.6 05/26/2021   HGBA1C 5.5 12/18/2019   HGBA1C 5.6 10/23/2015    Assessment & Plan   1. Anxiety and depression Much improved.  She can continue on the 50mg  (which she has been taking for about 2 weeks) or she can increase to 100mg  in a couple more weeks if she would like to.  She is having great results  - sertraline (ZOLOFT) 100 MG tablet; Take 1 tablet (100 mg total) by mouth daily.  Dispense: 90 tablet; Refill: 1  2.  Language barrier [employer]N "Antonio" interpreters used and additional time performing visit was required.     Return in about 6 months (around 06/09/2022) for PCP .  The patient was given clear instructions to go to ER or return to medical center if symptoms don't improve, worsen or new problems develop. The patient verbalized understanding. The patient was told to call to get lab results if they haven't heard anything in the next week.      Freeman Caldron, PA-C [facility] and [facility] [place], [place]   12/09/2021, 4:06 PM

## 2021-12-13 ENCOUNTER — Other Ambulatory Visit: Payer: Self-pay

## 2021-12-15 ENCOUNTER — Other Ambulatory Visit: Payer: Self-pay

## 2022-01-25 ENCOUNTER — Telehealth: Payer: Self-pay | Admitting: Emergency Medicine

## 2022-01-25 NOTE — Telephone Encounter (Signed)
Copied from [place] [id number]. Topic: Appointment Scheduling - Scheduling Inquiry for Clinic >> Jan 25, 2022  4:26 PM Ja-Kwan M wrote: Reason for CRM: Pt would like to schedule an appt for financial assistance and to apply for an orange card. Cb# [phone]

## 2022-02-15 ENCOUNTER — Ambulatory Visit
Admission: RE | Admit: 2022-02-15 | Discharge: 2022-02-15 | Disposition: A | Discharge status: Home / Self Care | Payer: Self-pay | Source: Ambulatory Visit | Attending: Internal Medicine | Admitting: Internal Medicine

## 2022-02-15 VITALS — BP 138/87 | HR 103 | Temp 98.4°F | Resp 18

## 2022-02-15 DIAGNOSIS — H6593 Unspecified nonsuppurative otitis media, bilateral: Secondary | ICD-10-CM

## 2022-02-15 DIAGNOSIS — R350 Frequency of micturition: Secondary | ICD-10-CM

## 2022-02-15 DIAGNOSIS — R42 Dizziness and giddiness: Secondary | ICD-10-CM

## 2022-02-15 DIAGNOSIS — R0789 Other chest pain: Secondary | ICD-10-CM

## 2022-02-15 DIAGNOSIS — R519 Headache, unspecified: Secondary | ICD-10-CM

## 2022-02-15 LAB — POCT URINALYSIS DIP (MANUAL ENTRY)
Bilirubin, UA: NEGATIVE
Blood, UA: NEGATIVE
Glucose, UA: NEGATIVE mg/dL
Ketones, POC UA: NEGATIVE mg/dL
Nitrite, UA: NEGATIVE
Protein Ur, POC: NEGATIVE mg/dL
Spec Grav, UA: 1.015 (ref 1.010–1.025)
Urobilinogen, UA: 0.2 E.U./dL
pH, UA: 6.5 (ref 5.0–8.0)

## 2022-02-15 LAB — POCT URINE PREGNANCY: Preg Test, Ur: NEGATIVE

## 2022-02-15 LAB — POCT FASTING CBG KUC MANUAL ENTRY: POCT Glucose (KUC): 99 mg/dL (ref 70–99)

## 2022-02-15 MED ORDER — CETIRIZINE HCL 10 MG PO TABS
10.0000 mg | ORAL_TABLET | Freq: Every day | ORAL | 0 refills | Status: DC
  Filled 2022-02-15: qty 30, 30d supply, fill #0

## 2022-02-15 MED ORDER — FLUTICASONE PROPIONATE 50 MCG/ACT NA SUSP
1.0000 | Freq: Every day | NASAL | 0 refills | Status: DC
  Filled 2022-02-15: qty 16, 60d supply, fill #0

## 2022-02-15 NOTE — Discharge Instructions (Addendum)
Please follow-up with your primary care doctor for any further evaluation and management.  Go to the emergency department if symptoms persist or worsen.  You have fluid behind your eardrums which may be causing your ear discomfort, dizziness, headache.  I have prescribed 2 medications to help alleviate the symptoms.  Please do not take hydroxyzine and cetirizine in the same 24 hours.  Also follow-up with cardiologist tomorrow to schedule appointment for further evaluation of your chest pain.  Go straight to the ER if chest pain persists or worsens.

## 2022-02-15 NOTE — ED Provider Notes (Signed)
[facility]    CSN: [id number] Arrival date & time: 02/15/22  1605      History   Chief Complaint Chief Complaint  Patient presents with   Headache    Dumb ido en el odo derecho - Entered by patient   Dizziness   Tinnitus   Chest Pain    HPI Tracie [name] is a 41 y.o. female.   Patient presents with several different complaints today.  The following symptoms have been present over the past 10 days.  Patient reports that she has been having intermittent headaches with the last one being present yesterday.  She denies any current headache.  Headache has been present in the posterior head and is described as a "throbbing pain". Denies any recent falls or head injuries.  Denies any associated blurry vision, nausea, vomiting but does report intermittent dizziness.  Dizziness does not occur at the same time of headache though.  This is intermittent and she denies any current dizziness.  Dizziness is not affected by position change.  Reports prior history of headaches that she has been evaluated by her PCP for approximately 5 to 6 months ago but reports that this headache "feels different".  She denies any prior history of dizziness.  Patient also reporting some right ear pain and discomfort.  She denies any associated nasal congestion, runny nose, cough, fever.  Denies trauma or foreign body to the ear.  Patient also reporting some left-sided chest pressure/pain.  States that it is currently present.  Denies any associated shortness of breath.  It does not exacerbate pain.  Denies any injury to the area.  Patient denies any prior history of cardiac problems.  Patient has a history of anxiety and currently takes medications.  Patient denies that she feels anxious when the symptoms occur.  She states that her anxiety today has been well-controlled recently.  Last menstrual cycle was approximately 1 week ago.  She denies vaginal discharge, urinary burning, abdominal pain,  diarrhea, blood in stool.  She does report some urinary frequency but reports this is baseline as she drinks a lot of water.   Headache Dizziness Chest Pain   Past Medical History:  Diagnosis Date   Anxiety    Depression    Headache     Patient Active Problem List   Diagnosis Date Noted   Gallstones 07/22/2021   Vaginal delivery 06/15/2020   Supervision of low-risk pregnancy, third trimester 06/14/2020   Vaginal bleeding in pregnancy, third trimester 05/19/2020   Sciatic pain, right 04/30/2020   Biological false positive RPR test 04/05/2020   Multigravida of advanced maternal age in third trimester 04/02/2020   Language barrier 04/02/2020   Supervision of high risk pregnancy, antepartum 12/18/2019   Anxiety and depression 06/12/2014   Generalized anxiety disorder 06/12/2014   Bilateral chronic knee pain 10/28/2013    Past Surgical History:  Procedure Laterality Date   NO PAST SURGERIES      OB History     Gravida  2   Para  2   Term  2   Preterm      AB      Living  2      SAB  0   IAB  0   Ectopic      Multiple  0   Live Births  2            Home Medications    Prior to Admission medications   Medication Sig Start Date End Date  Taking? Authorizing Provider  cetirizine (ZYRTEC) 10 MG tablet Take 1 tablet (10 mg total) by mouth daily. 02/15/22  Yes , Rolly Salter E, FNP  fluticasone (FLONASE) 50 MCG/ACT nasal spray Place 1 spray into both nostrils daily. 02/15/22  Yes , Acie Fredrickson, FNP  hydrOXYzine (ATARAX) 25 MG tablet Take 1/2 to 1 tablet by mouth every 8 hours  as needed for anxiety 09/22/21   Georgian Co M, PA-C  hyoscyamine (LEVSIN SL) 0.125 MG SL tablet Take 1 tablet by mouth 2-3 times daily for abdominal cramps and diarrhea Patient not taking: Reported on 09/22/2021 07/22/21   Esterwood, Amy S, PA-C  methocarbamol (ROBAXIN) 500 MG tablet Take 2 tablets (1,000 mg total) by mouth every 8 (eight) hours as needed for muscle spasms. 09/22/21    Anders Simmonds, PA-C  omeprazole (PRILOSEC) 20 MG capsule Take 1 capsule (20 mg total) by mouth daily. Patient not taking: Reported on 09/22/2021 08/12/20   Anders Simmonds, PA-C  ondansetron (ZOFRAN) 4 MG tablet Take 1 tablet (4 mg total) by mouth every 8 (eight) hours as needed for nausea or vomiting. Patient not taking: Reported on 09/22/2021 07/01/21   Sherrilyn Rist, MD  sertraline (ZOLOFT) 100 MG tablet Take 1 tablet (100 mg total) by mouth daily. 12/09/21   Anders Simmonds, PA-C  topiramate (TOPAMAX) 25 MG tablet Take 1 tablet (25 mg total) by mouth daily. 05/26/21 08/24/21  Claiborne Rigg, NP    Family History Family History  Problem Relation Age of Onset   Hypertension Neg Hx    Colon cancer Neg Hx    Esophageal cancer Neg Hx    Pancreatic cancer Neg Hx    Stomach cancer Neg Hx    Liver disease Neg Hx     Social History Social History   Tobacco Use   Smoking status: Never   Smokeless tobacco: Never  Vaping Use   Vaping Use: Never used  Substance Use Topics   Alcohol use: No   Drug use: No     Allergies   Patient has no known allergies.   Review of Systems Review of Systems Per HPI  Physical Exam Triage Vital Signs ED Triage Vitals [02/15/22 1641]  Enc Vitals Group     BP 138/87     Pulse Rate (!) 103     Resp 18     Temp 98.4 F (36.9 C)     Temp src      SpO2 98 %     Weight      Height      Head Circumference      Peak Flow      Pain Score      Pain Loc      Pain Edu?      Excl. in GC?    No data found.  Updated Vital Signs BP 138/87   Pulse (!) 103   Temp 98.4 F (36.9 C)   Resp 18   SpO2 98%   Breastfeeding No   Visual Acuity Right Eye Distance:   Left Eye Distance:   Bilateral Distance:    Right Eye Near:   Left Eye Near:    Bilateral Near:     Physical Exam Constitutional:      General: She is not in acute distress.    Appearance: Normal appearance. She is not toxic-appearing or diaphoretic.  HENT:     Head:  Normocephalic and atraumatic.     Right Ear: No drainage, swelling  or tenderness. A middle ear effusion is present. Tympanic membrane is not perforated, erythematous or bulging.     Left Ear: No drainage, swelling or tenderness. A middle ear effusion is present. Tympanic membrane is not perforated, erythematous or bulging.  Eyes:     Extraocular Movements: Extraocular movements intact.     Conjunctiva/sclera: Conjunctivae normal.     Pupils: Pupils are equal, round, and reactive to light.  Cardiovascular:     Rate and Rhythm: Normal rate and regular rhythm.     Pulses: Normal pulses.     Heart sounds: Normal heart sounds.  Pulmonary:     Effort: Pulmonary effort is normal. No respiratory distress.     Breath sounds: Normal breath sounds.  Chest:     Chest wall: No tenderness.  Abdominal:     General: Bowel sounds are normal. There is no distension.     Palpations: Abdomen is soft.     Tenderness: There is no abdominal tenderness.  Neurological:     General: No focal deficit present.     Mental Status: She is alert and oriented to person, place, and time. Mental status is at baseline.     Cranial Nerves: Cranial nerves 2-12 are intact.     Sensory: Sensation is intact.     Motor: Motor function is intact.     Coordination: Coordination is intact.     Gait: Gait is intact.  Psychiatric:        Mood and Affect: Mood normal.        Behavior: Behavior normal.        Thought Content: Thought content normal.        Judgment: Judgment normal.      UC Treatments / Results  Labs (all labs ordered are listed, but only abnormal results are displayed) Labs Reviewed  POCT URINALYSIS DIP (MANUAL ENTRY) - Abnormal; Notable for the following components:      Result Value   Clarity, UA hazy (*)    Leukocytes, UA Small (1+) (*)    All other components within normal limits  URINE CULTURE  CBC  COMPREHENSIVE METABOLIC PANEL  POCT URINE PREGNANCY  POCT FASTING CBG [facility] MANUAL ENTRY     EKG   Radiology No results found.  Procedures Procedures (including critical care time)  Medications Ordered in UC Medications - No data to display  Initial Impression / Assessment and Plan / UC Course  I have reviewed the triage vital signs and the nursing notes.  Pertinent labs & imaging results that were available during my care of the patient were reviewed by me and considered in my medical decision making (see chart for details).     Headache is not currently present.  Neuroexam is normal.  Advised safe over-the-counter pain relievers for intermittent headaches and to follow-up with PCP for any further evaluation.  Do not think emergent evaluation is necessary given physical exam and neuro exam are normal and no current headache.  Suspect dizziness is related to fluid behind TMs.  Will treat with antihistamine and Flonase.  Patient takes hydroxyzine as needed and reports that she typically only takes it about 2-3 times a month.  Therefore, will treat with antihistamine and Flonase.  Advised patient to avoid taking cetirizine and hydroxyzine together in the same 24 hours. Patient voiced understanding.   EKG was normal sinus rhythm.  Unsure exact etiology of chest pain.  It does not appear to be consistent with musculoskeletal etiology but given patient's age there  is low concern for cardiac etiology. Could be anxiety related.  Patient's HEAR score is less than 0 so do not think that emergent evaluation is necessary.  I do think that following up with cardiology on an outpatient basis will be best for patient.  Provided patient with cardiology contact information.  Advised patient to follow-up to schedule appointment.  Advised PCP follow-up as well.  Advised patient to follow-up at the ER if chest pain persists or worsens.  UA shows small amount of leukocytes.  Patient reports urinary frequency is baseline so given no associated urinary symptoms, will send urine culture and await  treatment for now.  CMP and CBC pending as well.  Awaiting results.  Patient was given strict return and ER precautions.  Vital signs stable so no emergent evaluation necessary.  Patient to follow-up with PCP as well.  Patient verbalized understanding and was agreeable with plan. Final Clinical Impressions(s) / UC Diagnoses   Final diagnoses:  Other chest pain  Dizziness and giddiness  Frequent headaches  Urinary frequency  Fluid level behind tympanic membrane of both ears     Discharge Instructions      Please follow-up with your primary care doctor for any further evaluation and management.  Go to the emergency department if symptoms persist or worsen.  You have fluid behind your eardrums which may be causing your ear discomfort, dizziness, headache.  I have prescribed 2 medications to help alleviate the symptoms.  Please do not take hydroxyzine and cetirizine in the same 24 hours.  Also follow-up with cardiologist tomorrow to schedule appointment for further evaluation of your chest pain.  Go straight to the ER if chest pain persists or worsens.    ED Prescriptions     Medication Sig Dispense Auth. Provider   fluticasone (FLONASE) 50 MCG/ACT nasal spray Place 1 spray into both nostrils daily. 16 g Ervin KnackMound, Chloe Baig E, [place]   cetirizine (ZYRTEC) 10 MG tablet Take 1 tablet (10 mg total) by mouth daily. 30 tablet [place]issors[place], Acie FredricksonHaley E, [place]NP      PDMP not reviewed this encounter.   Gustavus BryantMound, Valerio Pinard E, [place]NP 02/15/22 [phone]

## 2022-02-15 NOTE — ED Triage Notes (Signed)
Pt is present today with c/o HA, dizziness, chest pressure, and tinnitus. Pt sx started ten days ago

## 2022-02-16 ENCOUNTER — Other Ambulatory Visit: Payer: Self-pay

## 2022-02-17 ENCOUNTER — Other Ambulatory Visit: Payer: Self-pay

## 2022-02-17 ENCOUNTER — Ambulatory Visit: Payer: Self-pay | Attending: Physician Assistant | Admitting: Physician Assistant

## 2022-02-17 ENCOUNTER — Other Ambulatory Visit (HOSPITAL_COMMUNITY)
Admission: RE | Admit: 2022-02-17 | Discharge: 2022-02-17 | Disposition: A | Discharge status: Home / Self Care | Payer: Self-pay | Source: Ambulatory Visit | Attending: Physician Assistant | Admitting: Physician Assistant

## 2022-02-17 ENCOUNTER — Encounter: Payer: Self-pay | Admitting: Physician Assistant

## 2022-02-17 VITALS — BP 129/84 | HR 94 | Wt 170.0 lb

## 2022-02-17 DIAGNOSIS — N390 Urinary tract infection, site not specified: Secondary | ICD-10-CM

## 2022-02-17 DIAGNOSIS — G8929 Other chronic pain: Secondary | ICD-10-CM

## 2022-02-17 DIAGNOSIS — N898 Other specified noninflammatory disorders of vagina: Secondary | ICD-10-CM

## 2022-02-17 DIAGNOSIS — Z09 Encounter for follow-up examination after completed treatment for conditions other than malignant neoplasm: Secondary | ICD-10-CM

## 2022-02-17 DIAGNOSIS — F419 Anxiety disorder, unspecified: Secondary | ICD-10-CM

## 2022-02-17 DIAGNOSIS — R102 Pelvic and perineal pain: Secondary | ICD-10-CM

## 2022-02-17 DIAGNOSIS — Z789 Other specified health status: Secondary | ICD-10-CM

## 2022-02-17 DIAGNOSIS — L739 Follicular disorder, unspecified: Secondary | ICD-10-CM

## 2022-02-17 LAB — COMPREHENSIVE METABOLIC PANEL
ALT: 12 IU/L (ref 0–32)
AST: 13 IU/L (ref 0–40)
Albumin/Globulin Ratio: 1.8 (ref 1.2–2.2)
Albumin: 4.6 g/dL (ref 3.9–4.9)
Alkaline Phosphatase: 69 IU/L (ref 44–121)
BUN/Creatinine Ratio: 18 (ref 9–23)
BUN: 10 mg/dL (ref 6–20)
Bilirubin Total: <0.2 mg/dL (ref 0.0–1.2)
CO2: 22 mmol/L (ref 20–29)
Calcium: 10.1 mg/dL (ref 8.7–10.2)
Chloride: 101 mmol/L (ref 96–106)
Creatinine, Ser: 0.57 mg/dL (ref 0.57–1.00)
Globulin, Total: 2.6 g/dL (ref 1.5–4.5)
Glucose: 87 mg/dL (ref 70–99)
Potassium: 4.2 mmol/L (ref 3.5–5.2)
Sodium: 140 mmol/L (ref 134–144)
Total Protein: 7.2 g/dL (ref 6.0–8.5)
eGFR: 118 mL/min/{1.73_m2} (ref >59)

## 2022-02-17 LAB — URINE CULTURE: Culture: <10000 — AB

## 2022-02-17 LAB — CBC
Hematocrit: 40.4 % (ref 34.0–46.6)
Hemoglobin: 12.7 g/dL (ref 11.1–15.9)
MCH: 27.1 pg (ref 26.6–33.0)
MCHC: 31.4 g/dL — ABNORMAL LOW (ref 31.5–35.7)
MCV: 86 fL (ref 79–97)
Platelets: 329 10*3/uL (ref 150–450)
RBC: 4.68 x10E6/uL (ref 3.77–5.28)
RDW: 13.1 % (ref 11.7–15.4)
WBC: 7.3 10*3/uL (ref 3.4–10.8)

## 2022-02-17 MED ORDER — FLUCONAZOLE 150 MG PO TABS
150.0000 mg | ORAL_TABLET | Freq: Once | ORAL | 0 refills | Status: AC
  Filled 2022-02-17: qty 1, 1d supply, fill #0

## 2022-02-17 MED ORDER — MUPIROCIN 2 % EX OINT
1.0000 | TOPICAL_OINTMENT | Freq: Two times a day (BID) | CUTANEOUS | 1 refills | Status: DC
  Filled 2022-02-17: qty 22, 11d supply, fill #0

## 2022-02-17 MED ORDER — SULFAMETHOXAZOLE-TRIMETHOPRIM 800-160 MG PO TABS
1.0000 | ORAL_TABLET | Freq: Two times a day (BID) | ORAL | 0 refills | Status: DC
  Filled 2022-02-17: qty 14, 7d supply, fill #0

## 2022-02-17 NOTE — Progress Notes (Signed)
Patient ID: Tracie [name], female   DOB: 03-04-1981, 41 y.o.   MRN: [id number]       Cerissa Zeiger, is a 41 y.o. female  HFG:[id number]  CEY:[id number]  DOB - [birth year]  Chief Complaint  Patient presents with   Back Pain   Pelvic Pain       Subjective:   Tracie [name] is a 41 y.o. female here today for a follow up visit Seen at [facility] 02/15/2022 for dizziness/CP/ frequent urination.  She was given flonase for ETD.  She has had more stress over the last couple of weeks.  She is taking a pyridium type medication that has turned her urine a bright yellow.  She is having mild vaginal discharge too.  No fever.  She is also getting boils in her scalp.  She would like to see a gynecologist for ongoing pelvic pain.  She does NOT have acute pelvis pain  From UC note: HPI Tracie [name] is a 41 y.o. female.    Patient presents with several different complaints today.  The following symptoms have been present over the past 10 days.  Patient reports that she has been having intermittent headaches with the last one being present yesterday.  She denies any current headache.  Headache has been present in the posterior head and is described as a "throbbing pain". Denies any recent falls or head injuries.  Denies any associated blurry vision, nausea, vomiting but does report intermittent dizziness.  Dizziness does not occur at the same time of headache though.  This is intermittent and she denies any current dizziness.  Dizziness is not affected by position change.  Reports prior history of headaches that she has been evaluated by her PCP for approximately 5 to 6 months ago but reports that this headache "feels different".  She denies any prior history of dizziness.  Patient also reporting some right ear pain and discomfort.  She denies any associated nasal congestion, runny nose, cough, fever.  Denies trauma or foreign body to the ear.  Patient also reporting some left-sided chest  pressure/pain.  States that it is currently present.  Denies any associated shortness of breath.  It does not exacerbate pain.  Denies any injury to the area.  Patient denies any prior history of cardiac problems.  Patient has a history of anxiety and currently takes medications.  Patient denies that she feels anxious when the symptoms occur.  She states that her anxiety today has been well-controlled recently.  Last menstrual cycle was approximately 1 week ago.  She denies vaginal discharge, urinary burning, abdominal pain, diarrhea, blood in stool.  She does report some urinary frequency but reports this is baseline as she drinks a lot of water   Headache is not currently present.  Neuroexam is normal.  Advised safe over-the-counter pain relievers for intermittent headaches and to follow-up with PCP for any further evaluation.  Do not think emergent evaluation is necessary given physical exam and neuro exam are normal and no current headache.   Suspect dizziness is related to fluid behind TMs.  Will treat with antihistamine and Flonase.  Patient takes hydroxyzine as needed and reports that she typically only takes it about 2-3 times a month.  Therefore, will treat with antihistamine and Flonase.  Advised patient to avoid taking cetirizine and hydroxyzine together in the same 24 hours. Patient voiced understanding.    EKG was normal sinus rhythm.  Unsure exact etiology of chest pain.  It does not appear to be consistent with  musculoskeletal etiology but given patient's age there is low concern for cardiac etiology. Could be anxiety related.  Patient's HEAR score is less than 0 so do not think that emergent evaluation is necessary.  I do think that following up with cardiology on an outpatient basis will be best for patient.  Provided patient with cardiology contact information.  Advised patient to follow-up to schedule appointment.  Advised PCP follow-up as well.  Advised patient to follow-up at the ER if  chest pain persists or worsens.   UA shows small amount of leukocytes.  Patient reports urinary frequency is baseline so given no associated urinary symptoms, will send urine culture and await treatment for now.  CMP and CBC pending as well.  Awaiting results.  Patient was given strict return and ER precautions.  Vital signs stable so no emergent evaluation necessary.   No problems updated.  ALLERGIES: No Known Allergies  PAST MEDICAL HISTORY: Past Medical History:  Diagnosis Date   Anxiety    Depression    Headache     MEDICATIONS AT HOME: Prior to Admission medications   Medication Sig Start Date End Date Taking? Authorizing Provider  cetirizine (ZYRTEC) 10 MG tablet Take 1 tablet (10 mg total) by mouth daily. 02/15/22  Yes Mound, Acie Fredrickson, FNP  fluconazole (DIFLUCAN) 150 MG tablet Take 1 tablet (150 mg total) by mouth once for 1 dose. 02/17/22 02/17/22 Yes Brandelyn Henne, Marzella Schlein, PA-C  fluticasone (FLONASE) 50 MCG/ACT nasal spray Place 1 spray into both nostrils daily. 02/15/22  Yes Mound, Acie Fredrickson, FNP  hydrOXYzine (ATARAX) 25 MG tablet Take 1/2 to 1 tablet by mouth every 8 hours  as needed for anxiety 09/22/21  Yes Francile Woolford M, PA-C  hyoscyamine (LEVSIN SL) 0.125 MG SL tablet Take 1 tablet by mouth 2-3 times daily for abdominal cramps and diarrhea 07/22/21  Yes Esterwood, Amy S, PA-C  mupirocin ointment (BACTROBAN) 2 % Apply 1 Application topically 2 (two) times daily. 02/17/22  Yes Fate Caster M, PA-C  ondansetron (ZOFRAN) 4 MG tablet Take 1 tablet (4 mg total) by mouth every 8 (eight) hours as needed for nausea or vomiting. 07/01/21  Yes Danis, Andreas Blower, MD  sulfamethoxazole-trimethoprim (BACTRIM DS) 800-160 MG tablet Take 1 tablet by mouth 2 (two) times daily. 02/17/22  Yes Quaniyah Bugh, Marzella Schlein, PA-C  methocarbamol (ROBAXIN) 500 MG tablet Take 2 tablets (1,000 mg total) by mouth every 8 (eight) hours as needed for muscle spasms. Patient not taking: Reported on 02/17/2022 09/22/21    Anders Simmonds, PA-C  omeprazole (PRILOSEC) 20 MG capsule Take 1 capsule (20 mg total) by mouth daily. Patient not taking: Reported on 09/22/2021 08/12/20   Anders Simmonds, PA-C  sertraline (ZOLOFT) 100 MG tablet Take 1 tablet (100 mg total) by mouth daily. Patient not taking: Reported on 02/17/2022 12/09/21   Anders Simmonds, PA-C  topiramate (TOPAMAX) 25 MG tablet Take 1 tablet (25 mg total) by mouth daily. 05/26/21 08/24/21  Claiborne Rigg, NP    ROS: Neg HEENT Neg resp Neg cardiac Neg GI Neg MS Neg psych Neg neuro  Objective:   Vitals:   02/17/22 1410  BP: 129/84  Pulse: 94  SpO2: 99%  Weight: 170 lb (77.1 kg)   Exam General appearance : Awake, alert, not in any distress. Speech Clear. Not toxic looking HEENT: Atraumatic and Normocephalic Neck: Supple, no JVD. No cervical lymphadenopathy.  Chest: Good air entry bilaterally, CTAB.  No rales/rhonchi/wheezing CVS: S1 S2 regular, no murmurs.  Abdomen: Bowel sounds present, Non tender and not distended with no gaurding, rigidity or rebound. Extremities: B/L Lower Ext shows no edema, both legs are warm to touch Neurology: Awake alert, and oriented X 3, CN II-XII intact, Non focal Scalp with some small pustules  Data Review Lab Results  Component Value Date   HGBA1C 5.6 05/26/2021   HGBA1C 5.5 12/18/2019   HGBA1C 5.6 10/23/2015    Assessment & Plan   1. Urinary tract infection without hematuria, site unspecified Nitrite positive on dip - sulfamethoxazole-trimethoprim (BACTRIM DS) 800-160 MG tablet; Take 1 tablet by mouth 2 (two) times daily.  Dispense: 14 tablet; Refill: 0 - fluconazole (DIFLUCAN) 150 MG tablet; Take 1 tablet (150 mg total) by mouth once for 1 dose.  Dispense: 1 tablet; Refill: 0 - POCT URINALYSIS DIP (CLINITEK) - Urine Culture - Cervicovaginal ancillary only  2. Folliculitis - sulfamethoxazole-trimethoprim (BACTRIM DS) 800-160 MG tablet; Take 1 tablet by mouth 2 (two) times daily.  Dispense: 14  tablet; Refill: 0 - mupirocin ointment (BACTROBAN) 2 %; Apply 1 Application topically 2 (two) times daily.  Dispense: 22 g; Refill: 1  3. Anxiety Continue zoloft and use hydroxyzine if panicked  4. Encounter for examination following treatment at hospital  5. Language barrier AMN interpreters "Alica" used and additional time performing visit was required.   6. Vaginal discharge with chronic pelvic pain Refer to gyn - Cervicovaginal ancillary only    Return if symptoms worsen or fail to improve.  The patient was given clear instructions to go to ER or return to medical center if symptoms don't improve, worsen or new problems develop. The patient verbalized understanding. The patient was told to call to get lab results if they haven't heard anything in the next week.      Georgian Co, PA-C [facility] [place], [place] [phone]   02/17/2022, 2:33 PM

## 2022-02-17 NOTE — Patient Instructions (Signed)
Foliculitis Folliculitis  La foliculitis ocurre cuando los folculos pilosos se inflaman. Un folculo piloso es una pequea abertura en la piel desde donde crece el pelo. Esta afeccin suele ocurrir en el cuero cabelludo, los muslos, las piernas, la espalda y las nalgas, pero puede presentarse en cualquier parte del cuerpo. Cules son las causas? Una causa frecuente de este trastorno es una infeccin bacteriana. El tipo de foliculitis causada por las bacterias puede durar mucho tiempo o desaparecer y regresar. Las bacterias pueden vivir en cualquier parte de la piel. A menudo se encuentran en las fosas nasales. Algunas otras causas son las siguientes: Infeccin por un hongo. Infeccin por un virus. El contacto de la piel con algunas sustancias qumicas, como aceites y alquitranes. Rasurarse o depilarse con cera. Ungentos grasos o cremas aplicados sobre la piel. Qu incrementa el riesgo? Es ms probable que desarrolle esta afeccin si: Tiene debilitado el sistema que combate las enfermedades (sistema inmunitario). Tiene diabetes. Es obeso. Cules son los signos o sntomas? Los sntomas de esta afeccin incluyen: Enrojecimiento. Molestias. Hinchazn. Picazn. Pequeas manchas blancas o amarillas, que causan picazn, llenas de pus (pstulas) que aparecen sobre una zona enrojecida. Si la infeccin se adentra en el folculo, puede transformarse en un fornculo (divieso). Un grupo de fornculos (ntrax). En general, estos se forman en zonas pilosas y sudorosas del cuerpo. Cmo se diagnostica? Esta afeccin se diagnostica con un examen cutneo. El mdico puede tomar una muestra de una de las pstulas o de uno de los fornculos para analizarla en un laboratorio. Cmo se trata? El tratamiento para esta afeccin puede incluir lo siguiente: Colocar un pao tibio y hmedo (compresa tibia) en las zonas afectadas. Tomar antibiticos o aplicarlos sobre la piel. Aplicarse una solucin que  destruye los grmenes (antisptica) o baarse con ella. Tomar medicamentos de venta libre. Esto puede aliviar la picazn. Someterse a un procedimiento para drenar las pstulas o los fornculos. Este procedimiento puede ser necesario si una pstula o un fornculo contiene mucho pus o lquido. Depilacin lser. Puede hacerse cuando la afeccin dura mucho tiempo. Siga estas instrucciones en su casa: Control del dolor y de la hinchazn  Si se lo indican, aplique calor en la zona afectada con la frecuencia que le haya indicado el mdico. Use la fuente de calor que el mdico le recomiende, como una compresa de calor hmedo o una almohadilla trmica. Coloque una toalla entre la piel y la fuente de calor. Aplique calor durante 20 a 30 minutos. Si la piel se le pone de color rojo brillante, retire el calor de inmediato para evitar quemaduras. El riesgo de quemaduras es mayor si no puede sentir el dolor, el calor o el fro. Instrucciones generales Use los medicamentos de venta libre y los recetados solamente como se lo haya indicado el mdico. Si le recetaron antibiticos, tmelos o aplqueselos como se lo haya indicado el mdico. No deje de usar el antibitico aunque comience a sentirse mejor. Controle todos los das la zona irritada para ver si hay signos de infeccin. Est atento a los siguientes signos: Aumento del enrojecimiento, la hinchazn o el dolor. Lquido o sangre. Calor. Pus o mal olor. No rasure la piel irritada. Concurra a todas las visitas de seguimiento. El mdico comprobar si los tratamientos son eficaces. Comunquese con un mdico si: Tiene fiebre. Tiene signos de infeccin. Lneas rojas que se extienden desde la zona afectada. Esta informacin no tiene como fin reemplazar el consejo del mdico. Asegrese de hacerle al mdico cualquier pregunta que   tenga. Document Revised: 08/27/2021 Document Reviewed: 08/27/2021 Elsevier Patient Education  2023 Elsevier Inc.  

## 2022-02-18 LAB — POCT URINALYSIS DIP (CLINITEK)
Bilirubin, UA: NEGATIVE
Blood, UA: NEGATIVE
Glucose, UA: NEGATIVE mg/dL
Ketones, POC UA: NEGATIVE mg/dL
Nitrite, UA: POSITIVE — AB
POC PROTEIN,UA: NEGATIVE
Spec Grav, UA: 1.025 (ref 1.010–1.025)
Urobilinogen, UA: 0.2 E.U./dL
pH, UA: 6.5 (ref 5.0–8.0)

## 2022-02-18 LAB — CERVICOVAGINAL ANCILLARY ONLY
Bacterial Vaginitis (gardnerella): NEGATIVE
Candida Glabrata: NEGATIVE
Candida Vaginitis: NEGATIVE
Chlamydia: NEGATIVE
Comment: NEGATIVE
Comment: NEGATIVE
Comment: NEGATIVE
Comment: NEGATIVE
Comment: NEGATIVE
Comment: NORMAL
Neisseria Gonorrhea: NEGATIVE
Trichomonas: NEGATIVE

## 2022-02-19 LAB — URINE CULTURE

## 2022-03-25 ENCOUNTER — Other Ambulatory Visit: Payer: Self-pay

## 2022-03-28 ENCOUNTER — Other Ambulatory Visit: Payer: Self-pay

## 2022-03-28 ENCOUNTER — Other Ambulatory Visit (HOSPITAL_COMMUNITY): Payer: Self-pay

## 2022-03-29 ENCOUNTER — Other Ambulatory Visit (HOSPITAL_COMMUNITY): Payer: Self-pay

## 2022-03-29 ENCOUNTER — Other Ambulatory Visit: Payer: Self-pay

## 2022-03-30 ENCOUNTER — Encounter (HOSPITAL_COMMUNITY): Payer: Self-pay

## 2022-03-30 ENCOUNTER — Other Ambulatory Visit: Payer: Self-pay

## 2022-03-30 ENCOUNTER — Other Ambulatory Visit (HOSPITAL_COMMUNITY): Payer: Self-pay

## 2022-04-05 ENCOUNTER — Other Ambulatory Visit: Payer: Self-pay

## 2022-04-22 ENCOUNTER — Other Ambulatory Visit (HOSPITAL_COMMUNITY): Payer: Self-pay

## 2022-04-22 ENCOUNTER — Other Ambulatory Visit: Payer: Self-pay

## 2022-04-22 ENCOUNTER — Other Ambulatory Visit: Payer: Self-pay | Admitting: Physician Assistant

## 2022-04-22 DIAGNOSIS — K30 Functional dyspepsia: Secondary | ICD-10-CM

## 2022-04-22 MED ORDER — OMEPRAZOLE 20 MG PO CPDR
20.0000 mg | DELAYED_RELEASE_CAPSULE | Freq: Every day | ORAL | 3 refills | Status: DC
  Filled 2022-04-22: qty 90, 90d supply, fill #0
  Filled 2022-05-02: qty 30, 30d supply, fill #0

## 2022-04-22 NOTE — Telephone Encounter (Signed)
Requested Prescriptions  Pending Prescriptions Disp Refills   omeprazole (PRILOSEC) 20 MG capsule 90 capsule 3    Sig: Take 1 capsule (20 mg total) by mouth daily.     Gastroenterology: Proton Pump Inhibitors Passed - 04/22/2022 10:01 AM      Passed - Valid encounter within last 12 months    Recent Outpatient Visits           2 months ago Urinary tract infection without hematuria, site unspecified   [place], [place]   4 months ago Anxiety and depression   [facility], [place], [place]   7 months ago Muscle spasm   [place], [place]   8 months ago [place], [place]   11 months ago Encounter for Papanicolaou smear for cervical cancer screening   [place] Gildardo Pounds, NP       Future Appointments             In 1 month Gildardo Pounds, NP [place]

## 2022-04-25 ENCOUNTER — Other Ambulatory Visit (HOSPITAL_COMMUNITY): Payer: Self-pay

## 2022-04-26 ENCOUNTER — Other Ambulatory Visit (HOSPITAL_COMMUNITY): Payer: Self-pay

## 2022-04-27 ENCOUNTER — Ambulatory Visit: Payer: Self-pay | Admitting: *Deleted

## 2022-04-27 NOTE — Telephone Encounter (Signed)
  Chief Complaint: Headache Symptoms: 8/10 since yesterday, states medication not working, Dx with migraines, "Nothing working." Dizziness and blurred vision at times. Noted large lump on left side of head near ear "Like 1/2 a lemon in size." Tender to touch. Does not check BP Frequency: 2 weeks off and on, now for 2 days ongoing. Pertinent Negatives: Patient denies  Disposition: [] ED /[x] Urgent Care (no appt availability in office) / [] Appointment(In office/virtual)/ []  [facility]/ [] Home Care/ [] Refused Recommended Disposition /[] [facility]/ []  Follow-up with PCP Additional Notes: No availability within protocol timeframe, advised UC. States will follow disposition. Care advise provided, pt verbalizes understanding.  Reason for Disposition  [1] SEVERE headache (e.g., excruciating) AND [2] not improved after 2 hours of pain medicine  Answer Assessment - Initial Assessment Questions 1. LOCATION: "Where does it hurt?"      Lower right of scalp "There is a lump, near ear."  Size of "1/2 lemon." 2. ONSET: "When did the headache start?" (Minutes, hours or days)      Months ago 3. PATTERN: "Does the pain come and go, or has it been constant since it started?"     Occurs daily 4. SEVERITY: "How bad is the pain?" and "What does it keep you from doing?"  (e.g., Scale 1-10; mild, moderate, or severe)   - MILD (1-3): doesn't interfere with normal activities    - MODERATE (4-7): interferes with normal activities or awakens from sleep    - SEVERE (8-10): excruciating pain, unable to do any normal activities        8/10 5. RECURRENT SYMPTOM: "Have you ever had headaches before?" If Yes, ask: "When was the last time?" and "What happened that time?"       6. CAUSE: "What do you think is causing the headache?"      7. MIGRAINE: "Have you been diagnosed with migraine headaches?" If Yes, ask: "Is this headache similar?"      Yes 8. HEAD INJURY: "Has there been any recent injury to  the head?"      No 9. OTHER SYMPTOMS: "Do you have any other symptoms?" (fever, stiff neck, eye pain, sore throat, cold symptoms)     Warm at times,dizziness at times, vision blurry at times  Protocols used: [facility]

## 2022-04-28 ENCOUNTER — Other Ambulatory Visit: Payer: Self-pay

## 2022-05-02 ENCOUNTER — Other Ambulatory Visit: Payer: Self-pay

## 2022-05-04 ENCOUNTER — Other Ambulatory Visit: Payer: Self-pay

## 2022-05-04 NOTE — Progress Notes (Signed)
Patient seen by Angus Seller and Pilar Jarvis, PharmD Candidate on 05/04/22 while they were picking up prescriptions at [place] at [facility].   Patient does not have an automated home blood pressure machine.   Blood pressure today was: 120/73, HR 66   Medication review was performed. They are taking medications as prescribed.   The following barriers to adherence were noted:  - They do not have cost concerns.  - They do not have transportation concerns.  - They do not need assistance obtaining refills.  - They do not occasionally forget to take some of their prescribed medications.  - They do not feel like one/some of their medications make them feel poorly.  - They do not have questions or concerns about their medications.  - They do have follow up scheduled with their primary care provider/cardiologist.   The following interventions were completed:  - Medications were reviewed  - Patient was educated on goal blood pressures and long term health implications of elevated blood pressure. Reports eating at home and uses salt, and uses exercise bike 2/week.  - Patient was educated on medications, including indication and administration   The patient has follow up scheduled: 05/16/22  PCP: Dr Raul Del   Angus Seller and Pilar Jarvis, PharmD Candidates   Joseph Art, Pharm.D. PGY-2 Ambulatory Care Pharmacy Resident 05/04/2022 12:20 PM

## 2022-05-05 ENCOUNTER — Other Ambulatory Visit (HOSPITAL_COMMUNITY): Payer: Self-pay

## 2022-05-06 ENCOUNTER — Ambulatory Visit
Admission: EM | Admit: 2022-05-06 | Discharge: 2022-05-06 | Disposition: A | Discharge status: Home / Self Care | Payer: Self-pay | Attending: Physician Assistant | Admitting: Physician Assistant

## 2022-05-06 DIAGNOSIS — J020 Streptococcal pharyngitis: Secondary | ICD-10-CM

## 2022-05-06 LAB — POCT INFLUENZA A/B
Influenza A, POC: NEGATIVE
Influenza B, POC: NEGATIVE

## 2022-05-06 LAB — POCT RAPID STREP A (OFFICE): Rapid Strep A Screen: POSITIVE — AB

## 2022-05-06 MED ORDER — AMOXICILLIN 500 MG PO CAPS: 500.0000 mg | ORAL_CAPSULE | Freq: Three times a day (TID) | ORAL | 0 refills | Status: DC

## 2022-05-06 NOTE — ED Triage Notes (Signed)
Sore throat, chills, body aches that started yesterday morning. Taking ibuprofen and tylenol today, last took at 4 pm today.

## 2022-05-06 NOTE — ED Provider Notes (Signed)
[facility]    CSN: [id number] Arrival date & time: 05/06/22  1832      History   Chief Complaint Chief Complaint  Patient presents with   Sore Throat    chills and body pain - Entered by patient    HPI Tracie [name] is a 42 y.o. female.    Sore Throat Pertinent negatives include no abdominal pain and no shortness of breath.    Past Medical History:  Diagnosis Date   Anxiety    Depression    Headache     Patient Active Problem List   Diagnosis Date Noted   Gallstones 07/22/2021   Vaginal delivery 06/15/2020   Supervision of low-risk pregnancy, third trimester 06/14/2020   Vaginal bleeding in pregnancy, third trimester 05/19/2020   Sciatic pain, right 04/30/2020   Biological false positive RPR test 04/05/2020   Multigravida of advanced maternal age in third trimester 04/02/2020   Language barrier 04/02/2020   Supervision of high risk pregnancy, antepartum 12/18/2019   Anxiety and depression 06/12/2014   Generalized anxiety disorder 06/12/2014   Bilateral chronic knee pain 10/28/2013    Past Surgical History:  Procedure Laterality Date   NO PAST SURGERIES      OB History     Gravida  2   Para  2   Term  2   Preterm      AB      Living  2      SAB  0   IAB  0   Ectopic      Multiple  0   Live Births  2            Home Medications    Prior to Admission medications   Medication Sig Start Date End Date Taking? Authorizing Provider  cetirizine (ZYRTEC) 10 MG tablet Take 1 tablet (10 mg total) by mouth daily. 02/15/22   Teodora Medici, FNP  fluticasone (FLONASE) 50 MCG/ACT nasal spray Place 1 spray into both nostrils daily. 02/15/22   Teodora Medici, FNP  hydrOXYzine (ATARAX) 25 MG tablet Take 1/2 to 1 tablet by mouth every 8 hours  as needed for anxiety 09/22/21   Freeman Caldron M, PA-C  hyoscyamine (LEVSIN SL) 0.125 MG SL tablet Take 1 tablet by mouth 2-3 times daily for abdominal cramps and diarrhea 07/22/21    Esterwood, Amy S, PA-C  methocarbamol (ROBAXIN) 500 MG tablet Take 2 tablets (1,000 mg total) by mouth every 8 (eight) hours as needed for muscle spasms. Patient not taking: Reported on 02/17/2022 09/22/21   Argentina Donovan, PA-C  mupirocin ointment (BACTROBAN) 2 % Apply 1 Application topically 2 (two) times daily. 02/17/22   Argentina Donovan, PA-C  omeprazole (PRILOSEC) 20 MG capsule Take 1 capsule (20 mg total) by mouth daily. 04/22/22   Gildardo Pounds, NP  ondansetron (ZOFRAN) 4 MG tablet Take 1 tablet (4 mg total) by mouth every 8 (eight) hours as needed for nausea or vomiting. 07/01/21   Doran Stabler, MD  sertraline (ZOLOFT) 100 MG tablet Take 1 tablet (100 mg total) by mouth daily. Patient not taking: Reported on 02/17/2022 12/09/21   Argentina Donovan, PA-C  sulfamethoxazole-trimethoprim (BACTRIM DS) 800-160 MG tablet Take 1 tablet by mouth 2 (two) times daily. 02/17/22   Argentina Donovan, PA-C  topiramate (TOPAMAX) 25 MG tablet Take 1 tablet (25 mg total) by mouth daily. 05/26/21 08/24/21  Gildardo Pounds, NP    Family History Family History  Problem Relation Age of Onset   Hypertension Neg Hx    Colon cancer Neg Hx    Esophageal cancer Neg Hx    Pancreatic cancer Neg Hx    Stomach cancer Neg Hx    Liver disease Neg Hx     Social History Social History   Tobacco Use   Smoking status: Never   Smokeless tobacco: Never  Vaping Use   Vaping Use: Never used  Substance Use Topics   Alcohol use: No   Drug use: No     Allergies   Patient has no known allergies.   Review of Systems Review of Systems  Constitutional:  Negative for chills and fever.  HENT:  Positive for congestion, sinus pressure and sore throat. Negative for ear pain.   Eyes:  Negative for discharge and redness.  Respiratory:  Positive for cough. Negative for shortness of breath and wheezing.   Gastrointestinal:  Negative for abdominal pain, diarrhea, nausea and vomiting.     Physical Exam Triage  Vital Signs ED Triage Vitals  Enc Vitals Group     BP      Pulse      Resp      Temp      Temp src      SpO2      Weight      Height      Head Circumference      Peak Flow      Pain Score      Pain Loc      Pain Edu?      Excl. in [place]?    No data found.  Updated Vital Signs There were no vitals taken for this visit.  Physical Exam Vitals and nursing note reviewed.  Constitutional:      General: She is not in acute distress.    Appearance: Normal appearance. She is not ill-appearing.  HENT:     Head: Normocephalic and atraumatic.     Right Ear: Tympanic membrane normal.     Left Ear: Tympanic membrane normal.     Nose: Congestion present.     Mouth/Throat:     Mouth: Mucous membranes are moist.     Pharynx: No oropharyngeal exudate or posterior oropharyngeal erythema.  Eyes:     Conjunctiva/sclera: Conjunctivae normal.  Cardiovascular:     Rate and Rhythm: Normal rate and regular rhythm.     Heart sounds: Normal heart sounds. No murmur heard. Pulmonary:     Effort: Pulmonary effort is normal. No respiratory distress.     Breath sounds: Normal breath sounds. No wheezing, rhonchi or rales.  Skin:    General: Skin is warm and dry.  Neurological:     Mental Status: She is alert.  Psychiatric:        Mood and Affect: Mood normal.        Thought Content: Thought content normal.      UC Treatments / Results  Labs (all labs ordered are listed, but only abnormal results are displayed) Labs Reviewed - No data to display  EKG   Radiology No results found.  Procedures Procedures (including critical care time)  Medications Ordered in UC Medications - No data to display  Initial Impression / Assessment and Plan / UC Course  I have reviewed the triage vital signs and the nursing notes.  Pertinent labs & imaging results that were available during my care of the patient were reviewed by me and considered in my medical decision making (see  chart for  details).     *** Final Clinical Impressions(s) / UC Diagnoses   Final diagnoses:  None   Discharge Instructions   None    ED Prescriptions   None    PDMP not reviewed this encounter.

## 2022-05-07 ENCOUNTER — Encounter: Payer: Self-pay | Admitting: Physician Assistant

## 2022-05-09 ENCOUNTER — Other Ambulatory Visit (HOSPITAL_COMMUNITY): Payer: Self-pay

## 2022-05-16 ENCOUNTER — Encounter: Payer: Self-pay | Admitting: Nurse Practitioner

## 2022-05-16 ENCOUNTER — Other Ambulatory Visit (HOSPITAL_COMMUNITY)
Admission: RE | Admit: 2022-05-16 | Discharge: 2022-05-16 | Disposition: A | Discharge status: Home / Self Care | Payer: Self-pay | Source: Ambulatory Visit | Attending: Family Medicine | Admitting: Family Medicine

## 2022-05-16 ENCOUNTER — Other Ambulatory Visit: Payer: Self-pay | Admitting: Obstetrics and Gynecology

## 2022-05-16 ENCOUNTER — Other Ambulatory Visit: Payer: Self-pay

## 2022-05-16 ENCOUNTER — Ambulatory Visit: Payer: Self-pay | Attending: Family Medicine | Admitting: Nurse Practitioner

## 2022-05-16 VITALS — BP 116/79 | HR 92 | Ht 64.0 in | Wt 172.0 lb

## 2022-05-16 DIAGNOSIS — N898 Other specified noninflammatory disorders of vagina: Secondary | ICD-10-CM

## 2022-05-16 DIAGNOSIS — Z1231 Encounter for screening mammogram for malignant neoplasm of breast: Secondary | ICD-10-CM

## 2022-05-16 DIAGNOSIS — R519 Headache, unspecified: Secondary | ICD-10-CM

## 2022-05-16 DIAGNOSIS — F419 Anxiety disorder, unspecified: Secondary | ICD-10-CM

## 2022-05-16 MED ORDER — HYDROXYZINE HCL 25 MG PO TABS
ORAL_TABLET | ORAL | 2 refills | Status: DC
  Filled 2022-05-16: qty 60, 20d supply, fill #0
  Filled 2022-08-30 (×2): qty 60, 20d supply, fill #1

## 2022-05-16 MED ORDER — TOPIRAMATE 50 MG PO TABS
25.0000 mg | ORAL_TABLET | Freq: Every day | ORAL | 1 refills | Status: DC
  Filled 2022-05-16: qty 90, 90d supply, fill #0

## 2022-05-16 NOTE — Progress Notes (Signed)
Assessment & Plan:  Tracie [name] was seen today for headache and vaginal itching.  Diagnoses and all orders for this visit:  Nonintractable headache, unspecified chronicity pattern, unspecified headache type -     topiramate (TOPAMAX) 50 MG tablet; Take 0.5-1 tablets (25-50 mg total) by mouth daily for headaches  Vaginal itching -     Cervicovaginal ancillary only  Anxiety -     hydrOXYzine (ATARAX) 25 MG tablet; Take 1/2 to 1 tablet by mouth every 8 hours as needed for anxiety  Breast cancer screening by mammogram -     MS DIGITAL SCREENING TOMO BILATERAL; Future    Patient has been counseled on age-appropriate routine health concerns for screening and prevention. These are reviewed and up-to-date. Referrals have been placed accordingly. Immunizations are up-to-date or declined.    Subjective:   Chief Complaint  Patient presents with   Headache   Vaginal Itching   HPI Tracie [name] 42 y.o. female presents to office today for follow up to headache and vaginitis.    She has a history of chronic headaches since 2022. Medications tried in the past include: topiramate, methocarbamol,  elavil, buspar, imitrex. She feels pain on the right side of the occipitial and frontal. Associated symptoms: fatigue, dizziness and nausea. She also has a history of anxiety Uninsured. Patient was urged to apply for the financial assistance program.  They were instructed to inquire at the front desk about the application process for the [place] discount, orange card or other financial assistance.      Endorses vaginal itching and irritation.   Review of Systems  Constitutional:  Negative for fever, malaise/fatigue and weight loss.  HENT: Negative.  Negative for nosebleeds.   Eyes: Negative.  Negative for blurred vision, double vision and photophobia.  Respiratory: Negative.  Negative for cough and shortness of breath.   Cardiovascular: Negative.  Negative for chest pain, palpitations and leg  swelling.  Gastrointestinal: Negative.  Negative for heartburn, nausea and vomiting.  Genitourinary:        SEE HPI  Musculoskeletal: Negative.  Negative for myalgias.  Neurological:  Positive for headaches. Negative for dizziness, focal weakness and seizures.  Psychiatric/Behavioral:  Negative for suicidal ideas. The patient is nervous/anxious.     Past Medical History:  Diagnosis Date   Anxiety    Depression    Headache     Past Surgical History:  Procedure Laterality Date   NO PAST SURGERIES      Family History  Problem Relation Age of Onset   Hypertension Neg Hx    Colon cancer Neg Hx    Esophageal cancer Neg Hx    Pancreatic cancer Neg Hx    Stomach cancer Neg Hx    Liver disease Neg Hx     Social History Reviewed with no changes to be made today.   Outpatient Medications Prior to Visit  Medication Sig Dispense Refill   cetirizine (ZYRTEC) 10 MG tablet Take 1 tablet (10 mg total) by mouth daily. 30 tablet 0   fluticasone (FLONASE) 50 MCG/ACT nasal spray Place 1 spray into both nostrils daily. 16 g 0   hyoscyamine (LEVSIN SL) 0.125 MG SL tablet Take 1 tablet by mouth 2-3 times daily for abdominal cramps and diarrhea 90 tablet 4   methocarbamol (ROBAXIN) 500 MG tablet Take 2 tablets (1,000 mg total) by mouth every 8 (eight) hours as needed for muscle spasms. 90 tablet 1   mupirocin ointment (BACTROBAN) 2 % Apply 1 Application topically 2 (two)  times daily. 22 g 1   omeprazole (PRILOSEC) 20 MG capsule Take 1 capsule (20 mg total) by mouth daily. 90 capsule 3   hydrOXYzine (ATARAX) 25 MG tablet Take 1/2 to 1 tablet by mouth every 8 hours  as needed for anxiety 60 tablet 2   ondansetron (ZOFRAN) 4 MG tablet Take 1 tablet (4 mg total) by mouth every 8 (eight) hours as needed for nausea or vomiting. (Patient not taking: Reported on 05/16/2022) 30 tablet 2   sertraline (ZOLOFT) 100 MG tablet Take 1 tablet (100 mg total) by mouth daily. (Patient not taking: Reported on  02/17/2022) 90 tablet 1   amoxicillin (AMOXIL) 500 MG capsule Take 1 capsule (500 mg total) by mouth 3 (three) times daily. (Patient not taking: Reported on 05/16/2022) 21 capsule 0   sulfamethoxazole-trimethoprim (BACTRIM DS) 800-160 MG tablet Take 1 tablet by mouth 2 (two) times daily. 14 tablet 0   topiramate (TOPAMAX) 25 MG tablet Take 1 tablet (25 mg total) by mouth daily. 90 tablet 0   No facility-administered medications prior to visit.    No Known Allergies     Objective:    BP 116/79   Pulse 92   Ht '5\' 4"'$  (1.626 m)   Wt 172 lb (78 kg)   LMP 05/01/2022 (Exact Date)   SpO2 99%   BMI 29.52 kg/m  Wt Readings from Last 3 Encounters:  05/16/22 172 lb (78 kg)  02/17/22 170 lb (77.1 kg)  12/09/21 169 lb (76.7 kg)    Physical Exam Vitals and nursing note reviewed.  Constitutional:      Appearance: She is well-developed.  HENT:     Head: Normocephalic and atraumatic.  Cardiovascular:     Rate and Rhythm: Normal rate and regular rhythm.     Heart sounds: Normal heart sounds. No murmur heard.    No friction rub. No gallop.  Pulmonary:     Effort: Pulmonary effort is normal. No tachypnea or respiratory distress.     Breath sounds: Normal breath sounds. No decreased breath sounds, wheezing, rhonchi or rales.  Chest:     Chest wall: No tenderness.  Abdominal:     General: Bowel sounds are normal.     Palpations: Abdomen is soft.  Musculoskeletal:        General: Normal range of motion.     Cervical back: Normal range of motion.  Skin:    General: Skin is warm and dry.  Neurological:     Mental Status: She is alert and oriented to person, place, and time.     Coordination: Coordination normal.  Psychiatric:        Behavior: Behavior normal. Behavior is cooperative.        Thought Content: Thought content normal.        Judgment: Judgment normal.          Patient has been counseled extensively about nutrition and exercise as well as the importance of adherence  with medications and regular follow-up. The patient was given clear instructions to go to ER or return to medical center if symptoms don't improve, worsen or new problems develop. The patient verbalized understanding.   Follow-up: Return if symptoms worsen or fail to improve.   Gildardo Pounds, FNP-BC [facility] [place], [place]   05/22/2022, 10:32 PM

## 2022-05-16 NOTE — Progress Notes (Signed)
Swelling on the right side of head towards to back.

## 2022-05-17 LAB — CERVICOVAGINAL ANCILLARY ONLY
Bacterial Vaginitis (gardnerella): POSITIVE — AB
Candida Glabrata: NEGATIVE
Candida Vaginitis: NEGATIVE
Chlamydia: NEGATIVE
Comment: NEGATIVE
Comment: NEGATIVE
Comment: NEGATIVE
Comment: NEGATIVE
Comment: NEGATIVE
Comment: NORMAL
Neisseria Gonorrhea: NEGATIVE
Trichomonas: NEGATIVE

## 2022-05-18 ENCOUNTER — Other Ambulatory Visit: Payer: Self-pay

## 2022-05-18 ENCOUNTER — Other Ambulatory Visit: Payer: Self-pay | Admitting: Nurse Practitioner

## 2022-05-18 DIAGNOSIS — B9689 Other specified bacterial agents as the cause of diseases classified elsewhere: Secondary | ICD-10-CM

## 2022-05-18 MED ORDER — METRONIDAZOLE 500 MG PO TABS
500.0000 mg | ORAL_TABLET | Freq: Two times a day (BID) | ORAL | 0 refills | Status: DC
  Filled 2022-05-18: qty 14, 7d supply, fill #0

## 2022-05-22 ENCOUNTER — Encounter: Payer: Self-pay | Admitting: Nurse Practitioner

## 2022-06-06 ENCOUNTER — Ambulatory Visit: Payer: Self-pay | Admitting: Nurse Practitioner

## 2022-06-09 ENCOUNTER — Ambulatory Visit
Admission: RE | Admit: 2022-06-09 | Discharge: 2022-06-09 | Disposition: A | Discharge status: Home / Self Care | Payer: No Typology Code available for payment source | Source: Ambulatory Visit | Attending: Obstetrics and Gynecology | Admitting: Obstetrics and Gynecology

## 2022-06-09 ENCOUNTER — Ambulatory Visit: Payer: Self-pay | Admitting: Hematology and Oncology

## 2022-06-09 VITALS — BP 119/74 | Wt 171.0 lb

## 2022-06-09 DIAGNOSIS — Z1231 Encounter for screening mammogram for malignant neoplasm of breast: Secondary | ICD-10-CM

## 2022-06-09 NOTE — Patient Instructions (Signed)
Taught Tracie [name] about self breast awareness and gave educational materials to take home. Patient did not need a Pap smear today due to last Pap smear was in 2023 per patient. Let her know [facility] will cover Pap smears every 5 years unless has a history of abnormal Pap smears. Referred patient to the [facility] for diagnostic mammogram. Appointment scheduled for 06/09/2022. Patient aware of appointment and will be there. Let patient know will follow up with her within the next couple weeks with results. Tracie [name] verbalized understanding.  Melodye Ped, NP 2:37 PM

## 2022-06-09 NOTE — Progress Notes (Signed)
[name] is a 42 y.o. female who presents to [facility] clinic today with no complaints.    Pap Smear: Pap not smear completed today. Last Pap smear was 2023 and was normal. Per patient has no history of an abnormal Pap smear. Last Pap smear result is not available in Epic.   Physical exam: Breasts Breasts symmetrical. No skin abnormalities bilateral breasts. No nipple retraction bilateral breasts. No nipple discharge bilateral breasts. No lymphadenopathy. No lumps palpated bilateral breasts.     Pelvic/Bimanual Pap is not indicated today    Smoking History: Patient has never smoked and was not referred to quit line.    Patient Navigation: Patient education provided. Access to services provided for patient through [place] interpreter provided. No transportation provided   Colorectal Cancer Screening: Per patient has never had colonoscopy completed No complaints today.    Breast and Cervical Cancer Risk Assessment: Patient does not have family history of breast cancer, known genetic mutations, or radiation treatment to the chest before age 26. Patient does not have history of cervical dysplasia, immunocompromised, or DES exposure in-utero.  Risk Assessment   No risk assessment data     A: [facility] exam without pap smear No complaints with benign exam.   P: Referred patient to the [place] [facility] for a screening mammogram. Appointment scheduled 06/09/22.  Melodye Ped, NP 06/09/2022 2:21 PM

## 2022-06-13 ENCOUNTER — Other Ambulatory Visit: Payer: Self-pay | Admitting: Internal Medicine

## 2022-06-14 ENCOUNTER — Other Ambulatory Visit: Payer: Self-pay | Admitting: Obstetrics and Gynecology

## 2022-06-14 DIAGNOSIS — R928 Other abnormal and inconclusive findings on diagnostic imaging of breast: Secondary | ICD-10-CM

## 2022-06-16 ENCOUNTER — Ambulatory Visit: Payer: Self-pay | Attending: Nurse Practitioner

## 2022-06-23 ENCOUNTER — Ambulatory Visit
Admission: RE | Admit: 2022-06-23 | Discharge: 2022-06-23 | Disposition: A | Discharge status: Home / Self Care | Payer: No Typology Code available for payment source | Source: Ambulatory Visit | Attending: Obstetrics and Gynecology | Admitting: Obstetrics and Gynecology

## 2022-06-23 ENCOUNTER — Ambulatory Visit
Admission: RE | Admit: 2022-06-23 | Discharge: 2022-06-23 | Disposition: A | Discharge status: Home / Self Care | Payer: Self-pay | Source: Ambulatory Visit | Attending: Obstetrics and Gynecology | Admitting: Obstetrics and Gynecology

## 2022-06-23 ENCOUNTER — Other Ambulatory Visit: Payer: Self-pay | Admitting: Obstetrics and Gynecology

## 2022-06-23 DIAGNOSIS — N6310 Unspecified lump in the right breast, unspecified quadrant: Secondary | ICD-10-CM

## 2022-06-23 DIAGNOSIS — R928 Other abnormal and inconclusive findings on diagnostic imaging of breast: Secondary | ICD-10-CM

## 2022-06-30 ENCOUNTER — Other Ambulatory Visit: Payer: Self-pay | Admitting: Obstetrics and Gynecology

## 2022-06-30 ENCOUNTER — Ambulatory Visit
Admission: RE | Admit: 2022-06-30 | Discharge: 2022-06-30 | Disposition: A | Discharge status: Home / Self Care | Payer: No Typology Code available for payment source | Source: Ambulatory Visit | Attending: Obstetrics and Gynecology | Admitting: Obstetrics and Gynecology

## 2022-06-30 DIAGNOSIS — N6310 Unspecified lump in the right breast, unspecified quadrant: Secondary | ICD-10-CM

## 2022-06-30 HISTORY — PX: BREAST BIOPSY: SHX20

## 2022-07-01 ENCOUNTER — Other Ambulatory Visit: Payer: Self-pay

## 2022-07-06 ENCOUNTER — Other Ambulatory Visit: Payer: Self-pay

## 2022-07-06 ENCOUNTER — Other Ambulatory Visit (HOSPITAL_COMMUNITY)
Admission: RE | Admit: 2022-07-06 | Discharge: 2022-07-06 | Disposition: A | Discharge status: Home / Self Care | Payer: Self-pay | Source: Ambulatory Visit | Attending: Obstetrics and Gynecology | Admitting: Obstetrics and Gynecology

## 2022-07-06 ENCOUNTER — Ambulatory Visit (INDEPENDENT_AMBULATORY_CARE_PROVIDER_SITE_OTHER): Payer: Self-pay | Admitting: Obstetrics and Gynecology

## 2022-07-06 ENCOUNTER — Encounter: Payer: Self-pay | Admitting: Obstetrics and Gynecology

## 2022-07-06 VITALS — BP 128/84 | HR 89 | Wt 170.0 lb

## 2022-07-06 DIAGNOSIS — R102 Pelvic and perineal pain: Secondary | ICD-10-CM | POA: Insufficient documentation

## 2022-07-06 DIAGNOSIS — G8929 Other chronic pain: Secondary | ICD-10-CM

## 2022-07-06 DIAGNOSIS — R030 Elevated blood-pressure reading, without diagnosis of hypertension: Secondary | ICD-10-CM

## 2022-07-06 DIAGNOSIS — N939 Abnormal uterine and vaginal bleeding, unspecified: Secondary | ICD-10-CM

## 2022-07-06 DIAGNOSIS — R319 Hematuria, unspecified: Secondary | ICD-10-CM

## 2022-07-06 LAB — POCT URINALYSIS DIP (DEVICE)
Bilirubin Urine: NEGATIVE
Glucose, UA: NEGATIVE mg/dL
Ketones, ur: NEGATIVE mg/dL
Leukocytes,Ua: NEGATIVE
Nitrite: NEGATIVE
Protein, ur: NEGATIVE mg/dL
Specific Gravity, Urine: 1.02 (ref 1.005–1.030)
Urobilinogen, UA: 0.2 mg/dL (ref 0.0–1.0)
pH: 8.5 — ABNORMAL HIGH (ref 5.0–8.0)

## 2022-07-06 NOTE — Progress Notes (Unsigned)
Obstetrics and Gynecology New Patient Evaluation  Appointment Date: 07/06/2022  OBGYN Clinic: [facility] for [facility]  Primary Care Provider: Catalina Antigua  Referring Provider: Anders Simmonds, PA-C  Chief Complaint: AUB, pelvic pain  History of Present Illness: Tracie [name] is a 42 y.o. Hispanic [phone] (Patient's last menstrual period was 05/23/2022 (approximate).), seen for the above chief complaint.  PMHx significant for h/o headaches.   Patient with peri-umbilical cramping, non-radiating for the past 7-8 months. She states that it gets worse with her periods and gets it off and on outside of her period, and she states that OTCs help with the discomfort. She also says that for the past few months she's also noticed that she gets a period that occurs about every 2-3 weeks from being qmonth and regular; each period lasts less than a week. Most recent period started yesterday  She also endorses anxiety that occurs about a day or two before a period and lasts during the period.   She states that she does not desire anymore kids.    Review of Systems: Pertinent items noted in HPI and remainder of comprehensive ROS otherwise negative.    Patient Active Problem List   Diagnosis Date Noted   Transient hypertension 07/06/2022   Gallstones 07/22/2021   Sciatic pain, right 04/30/2020   Biological false positive RPR test 04/05/2020   Language barrier 04/02/2020   Anxiety and depression 06/12/2014   Generalized anxiety disorder 06/12/2014   Bilateral chronic knee pain 10/28/2013    Past Medical History:  Past Medical History:  Diagnosis Date   Anxiety    Depression    Headache     Past Surgical History:  Past Surgical History:  Procedure Laterality Date   BREAST BIOPSY Right 06/30/2022   [place] RT BREAST BX W LOC DEV EA ADD LESION IMG BX SPEC US GUIDE 06/30/2022 [facility]   BREAST BIOPSY Right 06/30/2022   [place] RT BREAST BX W LOC DEV EA  ADD LESION IMG BX SPEC US GUIDE 06/30/2022 [facility]MO[facility]   BREAST BIOPSY Right 06/30/2022   [place] RT BREAST BX W LOC DEV 1ST LESION IMG BX SPEC US GUIDE 06/30/2022 [facility]MO[facility]   NO PAST SURGERIES      Past Obstetrical History:  OB History  Gravida Para Term Preterm AB Living  SAB IAB Ectopic Multiple Live Births  0 0   0 2    # Outcome Date GA Lbr Len/2nd Weight Sex Delivery Anes PTL Lv  2 Term 06/14/20 [redacted]w[redacted]d 05:58 / 00:26 6 lb 7.9 oz (2.946 kg) M Vag-Spont EPI  LIV  1 Term 2005     Vag-Spont   LIV    Past Gynecological History: As per HPI. History of Pap Smear(s): Yes.   Last pap 2023, which was negative pap and hpv She is currently using condoms for contraception.   Social History:  Social History   Socioeconomic History   Marital status: Single    Spouse name: Not on file   Number of children: 2   Years of education: Not on file   Highest education level: Not on file  Occupational History   Not on file  Tobacco Use   Smoking status: Never   Smokeless tobacco: Never  Vaping Use   Vaping Use: Never used  Substance and Sexual Activity   Alcohol use: No   Drug use: No   Sexual activity: Yes    Birth control/protection:  Condom  Other Topics Concern   Not on file  Social History Narrative   Not on file   Social Determinants of Health   Financial Resource Strain: Not on file  Food Insecurity: No Food Insecurity (06/09/2022)   Hunger Vital Sign    Worried About Running Out of Food in the Last Year: Never true    Ran Out of Food in the Last Year: Never true  Transportation Needs: No Transportation Needs (06/09/2022)   PRAPARE - Administrator, Civil Service (Medical): No    Lack of Transportation (Non-Medical): No  Physical Activity: Not on file  Stress: Not on file  Social Connections: Not on file  Intimate Partner Violence: Not on file    Family History:  Family History  Problem Relation Age of Onset   Breast cancer Paternal  Aunt    Breast cancer Paternal Aunt    Hypertension Neg Hx    Colon cancer Neg Hx    Esophageal cancer Neg Hx    Pancreatic cancer Neg Hx    Stomach cancer Neg Hx    Liver disease Neg Hx     Medications Kaula [name] had no medications administered during this visit. Current Outpatient Medications  Medication Sig Dispense Refill   hydrOXYzine (ATARAX) 25 MG tablet Take 1/2 to 1 tablet by mouth every 8 hours as needed for anxiety 60 tablet 2   cetirizine (ZYRTEC) 10 MG tablet Take 1 tablet (10 mg total) by mouth daily. (Patient not taking: Reported on 07/06/2022) 30 tablet 0   fluticasone (FLONASE) 50 MCG/ACT nasal spray Place 1 spray into both nostrils daily. (Patient not taking: Reported on 07/06/2022) 16 g 0   hyoscyamine (LEVSIN SL) 0.125 MG SL tablet Take 1 tablet by mouth 2-3 times daily for abdominal cramps and diarrhea (Patient not taking: Reported on 07/06/2022) 90 tablet 4   methocarbamol (ROBAXIN) 500 MG tablet Take 2 tablets (1,000 mg total) by mouth every 8 (eight) hours as needed for muscle spasms. (Patient not taking: Reported on 07/06/2022) 90 tablet 1   mupirocin ointment (BACTROBAN) 2 % Apply 1 Application topically 2 (two) times daily. (Patient not taking: Reported on 07/06/2022) 22 g 1   omeprazole (PRILOSEC) 20 MG capsule Take 1 capsule (20 mg total) by mouth daily. (Patient not taking: Reported on 07/06/2022) 90 capsule 3   ondansetron (ZOFRAN) 4 MG tablet Take 1 tablet (4 mg total) by mouth every 8 (eight) hours as needed for nausea or vomiting. (Patient not taking: Reported on 07/06/2022) 30 tablet 2   topiramate (TOPAMAX) 50 MG tablet Take 0.5-1 tablets (25-50 mg total) by mouth daily for headaches (Patient not taking: Reported on 07/06/2022) 90 tablet 1   No current facility-administered medications for this visit.    Allergies Patient has no known allergies.   Physical Exam:  BP 128/84   Pulse 89   Wt 170 lb (77.1 kg)   LMP 05/23/2022 (Approximate)   BMI  29.18 kg/m  Body mass index is 29.18 kg/m.  General appearance: Well nourished, well developed female in no acute distress.  Neck:  Supple, normal appearance, and no thyromegaly  Cardiovascular: normal s1 and s2.  No murmurs, rubs or gallops. Respiratory:  Clear to auscultation bilateral. Normal respiratory effort Abdomen: positive bowel sounds and no masses, hernias; diffusely non tender to palpation, non distended Neuro/Psych:  Normal mood and affect.  Skin:  Warm and dry.  Lymphatic:  No inguinal lymphadenopathy.   Cervical exam performed in the presence  of a chaperone Pelvic exam: is not limited by body habitus EGBUS: within normal limits Vagina: within normal limits and with no discharge in the vault. +scant old blood Cervix: normal appearing cervix without tenderness, discharge or lesions. Uterus:  nonenlarged and non tender Adnexa:  normal adnexa and no mass, fullness, tenderness Rectovaginal: deferred  Laboratory: ua dip with hematuria neg STD swab on 04/2022, neg cbc and urine culture 01/2022  Radiology: none  Assessment: patient stable  Plan:  1. Hematuria, unspecified type - Urinalysis, Routine w reflex microscopic - Urine Culture  2. Transient hypertension Repeat BP wnl  3. Chronic pelvic pain in female D/w her need for pelvic u/s and follow up re: options - Cervicovaginal ancillary only( [place])  4. AUB Consider endometrial biopsy next visit  RTC after ultrasound.   Cornelia Copa MD Attending Center for [facility])

## 2022-07-07 LAB — MICROSCOPIC EXAMINATION
Bacteria, UA: NONE SEEN
Casts: NONE SEEN /lpf
Epithelial Cells (non renal): NONE SEEN /hpf (ref 0–10)
WBC, UA: NONE SEEN /hpf (ref 0–5)

## 2022-07-07 LAB — URINALYSIS, ROUTINE W REFLEX MICROSCOPIC
Bilirubin, UA: NEGATIVE
Glucose, UA: NEGATIVE
Ketones, UA: NEGATIVE
Leukocytes,UA: NEGATIVE
Nitrite, UA: NEGATIVE
Protein,UA: NEGATIVE
Specific Gravity, UA: 1.017 (ref 1.005–1.030)
Urobilinogen, Ur: 0.2 mg/dL (ref 0.2–1.0)
pH, UA: 8.5 — ABNORMAL HIGH (ref 5.0–7.5)

## 2022-07-07 LAB — PROLACTIN: Prolactin: 6.9 ng/mL (ref 4.8–33.4)

## 2022-07-07 LAB — CERVICOVAGINAL ANCILLARY ONLY
Bacterial Vaginitis (gardnerella): NEGATIVE
Candida Glabrata: NEGATIVE
Candida Vaginitis: NEGATIVE
Chlamydia: NEGATIVE
Comment: NEGATIVE
Comment: NEGATIVE
Comment: NEGATIVE
Comment: NEGATIVE
Comment: NEGATIVE
Comment: NORMAL
Neisseria Gonorrhea: NEGATIVE
Trichomonas: NEGATIVE

## 2022-07-07 LAB — TSH RFX ON ABNORMAL TO FREE T4: TSH: 0.743 u[IU]/mL (ref 0.450–4.500)

## 2022-07-08 LAB — URINE CULTURE

## 2022-07-20 ENCOUNTER — Ambulatory Visit: Payer: Self-pay | Admitting: General Surgery

## 2022-07-20 DIAGNOSIS — N6081 Other benign mammary dysplasias of right breast: Secondary | ICD-10-CM | POA: Insufficient documentation

## 2022-07-25 ENCOUNTER — Ambulatory Visit (HOSPITAL_COMMUNITY)
Admission: RE | Admit: 2022-07-25 | Discharge: 2022-07-25 | Disposition: A | Discharge status: Home / Self Care | Payer: Self-pay | Source: Ambulatory Visit | Attending: Obstetrics and Gynecology | Admitting: Obstetrics and Gynecology

## 2022-07-25 ENCOUNTER — Other Ambulatory Visit: Payer: Self-pay | Admitting: General Surgery

## 2022-07-25 DIAGNOSIS — N6081 Other benign mammary dysplasias of right breast: Secondary | ICD-10-CM

## 2022-07-25 DIAGNOSIS — N939 Abnormal uterine and vaginal bleeding, unspecified: Secondary | ICD-10-CM | POA: Insufficient documentation

## 2022-07-25 DIAGNOSIS — R102 Pelvic and perineal pain: Secondary | ICD-10-CM | POA: Insufficient documentation

## 2022-07-25 DIAGNOSIS — G8929 Other chronic pain: Secondary | ICD-10-CM | POA: Insufficient documentation

## 2022-08-01 ENCOUNTER — Encounter: Payer: Self-pay | Admitting: Obstetrics and Gynecology

## 2022-08-01 ENCOUNTER — Ambulatory Visit (INDEPENDENT_AMBULATORY_CARE_PROVIDER_SITE_OTHER): Payer: Self-pay | Admitting: Obstetrics and Gynecology

## 2022-08-01 ENCOUNTER — Other Ambulatory Visit: Payer: Self-pay

## 2022-08-01 VITALS — BP 117/79 | HR 82 | Ht 64.0 in | Wt 168.1 lb

## 2022-08-01 DIAGNOSIS — G43829 Menstrual migraine, not intractable, without status migrainosus: Secondary | ICD-10-CM

## 2022-08-01 DIAGNOSIS — N939 Abnormal uterine and vaginal bleeding, unspecified: Secondary | ICD-10-CM

## 2022-08-01 DIAGNOSIS — R3121 Asymptomatic microscopic hematuria: Secondary | ICD-10-CM

## 2022-08-01 NOTE — Progress Notes (Unsigned)
Obstetrics and Gynecology Visit Return Patient Evaluation  Appointment Date: 08/01/2022  Primary Care Provider: [facility]  OBGYN Clinic: [facility]  Chief Complaint: u/s follow up for AUB.   History of Present Illness:  Tracie [name] is a 42 y.o. initially seen by me on 4/17 and at time she had new onset bleeding like a period about a week after her regular period. She states this all started early this year and that the the bleeding is not much that occurs a week after her regular period. Exam negative, as well as TSH, PRL, vaginal swab and urine culture. U/s also ordered and it was negative. March 2023 pap and HPV were negative  Interval History: Since that time, she states that she still has some bleeding about a week after her initial period and that is her concern; no pelvic pain. She states she has headaches when she is on her period and when she has the small AUB a week after her period; ROS for complex s/s or migraine s/s negative.   Review of Systems:  as noted in the History of Present Illness.  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 Current Outpatient Medications  Medication Sig Dispense Refill   hydrOXYzine (ATARAX) 25 MG tablet Take 1/2 to 1 tablet by mouth every 8 hours as needed for anxiety 60 tablet 2   cetirizine (ZYRTEC) 10 MG tablet Take 1 tablet (10 mg total) by mouth daily. (Patient not  taking: Reported on 08/01/2022) 30 tablet 0   fluticasone (FLONASE) 50 MCG/ACT nasal spray Place 1 spray into both nostrils daily. (Patient not taking: Reported on 08/01/2022) 16 g 0   hyoscyamine (LEVSIN SL) 0.125 MG SL tablet Take 1 tablet by mouth 2-3 times daily for abdominal cramps and diarrhea (Patient not taking: Reported on 08/01/2022) 90 tablet 4   methocarbamol (ROBAXIN) 500 MG tablet Take 2 tablets (1,000 mg total) by mouth every 8 (eight) hours as needed for muscle spasms. (Patient not taking: Reported on 08/01/2022) 90 tablet 1   mupirocin ointment (BACTROBAN) 2 % Apply 1 Application topically 2 (two) times daily. (Patient not taking: Reported on 08/01/2022) 22 g 1   omeprazole (PRILOSEC) 20 MG capsule Take 1 capsule (20 mg total) by mouth daily. (Patient not taking: Reported on 08/01/2022) 90 capsule 3   ondansetron (ZOFRAN) 4 MG tablet Take 1 tablet (4 mg total) by mouth every 8 (eight) hours as needed for nausea or vomiting. (Patient not taking: Reported on 08/01/2022) 30 tablet 2   topiramate (TOPAMAX) 50 MG tablet Take 0.5-1 tablets (25-50 mg total) by mouth daily for headaches (Patient not taking: Reported on 08/01/2022) 90 tablet 1   No current facility-administered medications for this visit.    Allergies: has No Known Allergies.  Physical Exam:  BP 117/79   Pulse 82   Ht 5\' 4"  (1.626 m)   Wt 168 lb 1.6 oz (76.2 kg)   BMI 28.85 kg/m  Body mass index is 28.85 kg/m. General appearance: Well  nourished, well developed female in no acute distress.  Abdomen: diffusely non tender to palpation, non distended, and no masses, hernias Neuro/Psych:  Normal mood and affect.    Labs: as per HPI  Radiology:  Narrative & Impression  CLINICAL DATA:  Chronic pelvic pain, abnormal uterine bleeding, LMP 07/11/2022   EXAM: TRANSABDOMINAL AND TRANSVAGINAL ULTRASOUND OF PELVIS   TECHNIQUE: Both transabdominal and transvaginal ultrasound examinations of the pelvis were performed.  Transabdominal technique was performed for global imaging of the pelvis including uterus, ovaries, adnexal regions, and pelvic cul-de-sac. It was necessary to proceed with endovaginal exam following the transabdominal exam to visualize the lower uterine segment/cervix and LEFT ovary.   COMPARISON:  12/05/2013   FINDINGS: Uterus   Measurements: 9.3 x 5.0 x 5.8 cm = volume: 142 mL. Anteverted. Normal morphology without mass.   Endometrium   Thickness: 10 mm.  No endometrial fluid or mass   Right ovary   Measurements: 3.3 x 2.2 x 2.1 cm = volume: 7.9 mL. Normal morphology without mass   Left ovary   Measurements: 3.4 x 2.6 x 3.1 cm = volume: 14.5 mL. Normal morphology without mass   Other findings   No free pelvic fluid or adnexal masses.   IMPRESSION: Normal exam.     Electronically Signed   By: Ulyses Southward M.D.   On: 07/25/2022 16:30   Assessment: patient stable  Plan:  1. AUB Patient recently undergoing work up for abnormal breast biopsy and has lumpectomy scheduled for July. I told recommend expectant management and f/u in August . I told her that hormone management could be an options but there is a potential concern for increased risk factor for breast cancer. She's also young for an endometrial biopsy but if AUB continues into August and consideration needed for surgery, such as a hysteroscopy d&c since can use hormones, then can do embx then.  Patient amenable to plan  2. Hematuria Microscopic on labs last visit. Negative prior ones and may have been finishing up her period in talking to her today. Recommend repeat with yearly physical/yearly labs.   IPad interpreter used.    Return for mid to late august 2024 in preson visit with dr Vergie Living.  Future Appointments  Date Time Provider Department Center  08/11/2022  3:45 PM Serena Croissant, MD [facility] None  08/11/2022  4:15 PM [facility] None  10/13/2022  1:00 PM [facility] MM IR 1 [facility]  GI[facility] CE  10/13/2022  1:30 PM [facility] MM IR 1 [facility]  10/14/2022 10:45 AM [facility] BREAST SPECIMEN [facility] CE  10/14/2022 10:50 AM [facility] BREAST SPECIMEN [facility] GI-BRE[facility] CE    Cornelia Copa MD Attending Center for [facility])

## 2022-08-02 DIAGNOSIS — R3121 Asymptomatic microscopic hematuria: Secondary | ICD-10-CM | POA: Insufficient documentation

## 2022-08-02 DIAGNOSIS — G43829 Menstrual migraine, not intractable, without status migrainosus: Secondary | ICD-10-CM | POA: Insufficient documentation

## 2022-08-11 ENCOUNTER — Inpatient Hospital Stay: Payer: No Typology Code available for payment source

## 2022-08-11 ENCOUNTER — Inpatient Hospital Stay: Payer: Self-pay | Attending: Hematology and Oncology | Admitting: Hematology and Oncology

## 2022-08-11 VITALS — BP 124/80 | HR 111 | Temp 98.1°F | Wt 168.6 lb

## 2022-08-11 DIAGNOSIS — N6081 Other benign mammary dysplasias of right breast: Secondary | ICD-10-CM | POA: Insufficient documentation

## 2022-08-11 DIAGNOSIS — Z803 Family history of malignant neoplasm of breast: Secondary | ICD-10-CM | POA: Insufficient documentation

## 2022-08-11 NOTE — Progress Notes (Signed)
[facility] CONSULT NOTE  Patient Care Team: Claiborne Rigg, NP as PCP - General (Nurse Practitioner)  CHIEF COMPLAINTS/PURPOSE OF CONSULTATION:  Newly diagnosed breast cancer  HISTORY OF PRESENTING ILLNESS:  Tracie [name] 42 y.o. 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  Patient had a screening mammogram that detected abnormality which led to a biopsy.  She was then subsequently seen by surgery who recommended lumpectomy.  She was sent to [place] for discussion regarding being high risk for breast cancer.  I reviewed her records extensively and collaborated the history with the patient.    MEDICAL HISTORY:  Past Medical History:  Diagnosis Date   Anxiety    Depression    Headache     SURGICAL HISTORY: Past Surgical History:  Procedure Laterality Date   BREAST BIOPSY Right 06/30/2022   [place] RT BREAST BX W LOC DEV EA ADD LESION IMG BX SPEC US GUIDE 06/30/2022 [facility]   BREAST BIOPSY Right 06/30/2022   [place] RT BREAST BX W LOC DEV EA ADD LESION IMG BX SPEC US GUIDE 06/30/2022 [facility]   BREAST BIOPSY Right 06/30/2022   [place] RT BREAST BX W LOC DEV 1ST LESION IMG BX SPEC US GUIDE 06/30/2022 [facility]    SOCIAL HISTORY: Social History   Socioeconomic History   Marital status: Single    Spouse name: Not on file   Number of children: 2   Years of education: Not on file   Highest education level: Not on file  Occupational History   Not on file  Tobacco Use   Smoking status: Never   Smokeless tobacco: Never  Vaping Use   Vaping Use: Never used  Substance and Sexual Activity   Alcohol use: No   Drug use: No   Sexual activity: Yes    Birth control/protection: Condom  Other Topics Concern   Not on file  Social History Narrative   Not on file   Social Determinants of Health   Financial Resource Strain: Not on file  Food Insecurity: No Food Insecurity  (08/01/2022)   Hunger Vital Sign    Worried About Running Out of Food in the Last Year: Never true    Ran Out of Food in the Last Year: Never true  Transportation Needs: No Transportation Needs (08/01/2022)   PRAPARE - Administrator, Civil Service (Medical): No    Lack of Transportation (Non-Medical): No  Physical Activity: Not on file  Stress: Not on file  Social Connections: Not on file  Intimate Partner Violence: Not on file    FAMILY HISTORY: Family History  Problem Relation Age of Onset   Breast cancer Paternal Aunt    Breast cancer Paternal Aunt    Hypertension Neg Hx    Colon cancer Neg Hx    Esophageal cancer Neg Hx    Pancreatic cancer Neg Hx    Stomach cancer Neg Hx    Liver disease Neg Hx     ALLERGIES:  has No Known Allergies.  MEDICATIONS:  Current Outpatient Medications  Medication Sig Dispense Refill   cetirizine (ZYRTEC) 10 MG tablet Take 1 tablet (10 mg total) by mouth daily. (Patient not taking: Reported on 08/01/2022) 30 tablet 0   fluticasone (FLONASE) 50 MCG/ACT nasal spray Place 1 spray into both nostrils daily. (Patient not taking: Reported on 08/01/2022) 16 g 0   hydrOXYzine (ATARAX) 25 MG tablet Take 1/2 to 1 tablet by  mouth every 8 hours as needed for anxiety 60 tablet 2   hyoscyamine (LEVSIN SL) 0.125 MG SL tablet Take 1 tablet by mouth 2-3 times daily for abdominal cramps and diarrhea (Patient not taking: Reported on 08/01/2022) 90 tablet 4   methocarbamol (ROBAXIN) 500 MG tablet Take 2 tablets (1,000 mg total) by mouth every 8 (eight) hours as needed for muscle spasms. (Patient not taking: Reported on 08/01/2022) 90 tablet 1   mupirocin ointment (BACTROBAN) 2 % Apply 1 Application topically 2 (two) times daily. (Patient not taking: Reported on 08/01/2022) 22 g 1   omeprazole (PRILOSEC) 20 MG capsule Take 1 capsule (20 mg total) by mouth daily. (Patient not taking: Reported on 08/01/2022) 90 capsule 3   ondansetron (ZOFRAN) 4 MG tablet Take 1  tablet (4 mg total) by mouth every 8 (eight) hours as needed for nausea or vomiting. (Patient not taking: Reported on 08/01/2022) 30 tablet 2   topiramate (TOPAMAX) 50 MG tablet Take 0.5-1 tablets (25-50 mg total) by mouth daily for headaches (Patient not taking: Reported on 08/01/2022) 90 tablet 1   No current facility-administered medications for this visit.    REVIEW OF SYSTEMS:   Constitutional: Denies fevers, chills or abnormal night sweats   All other systems were reviewed with the patient and are negative.  PHYSICAL EXAMINATION: ECOG PERFORMANCE STATUS: 1 - Symptomatic but completely ambulatory  Vitals:   08/11/22 1551  BP: 124/80  Pulse: (!) 111  Temp: 98.1 F (36.7 C)  SpO2: 100%   Filed Weights   08/11/22 1551  Weight: 168 lb 9.6 oz (76.5 kg)    GENERAL:alert, no distress and comfortable    LABORATORY DATA:  I have reviewed the data as listed Lab Results  Component Value Date   WBC 7.3 02/15/2022   HGB 12.7 02/15/2022   HCT 40.4 02/15/2022   MCV 86 02/15/2022   PLT 329 02/15/2022   Lab Results  Component Value Date   NA 140 02/15/2022   K 4.2 02/15/2022   CL 101 02/15/2022   CO2 22 02/15/2022    RADIOGRAPHIC STUDIES: I have personally reviewed the radiological reports and agreed with the findings in the report.  ASSESSMENT AND PLAN:  Flat epithelial atypia of breast, right 06/30/2022: Right breast biopsy: Usual ductal hyperplasia, focal flat epithelial atypia and fibrocystic changes Pathology counseling: Focal epithelial atypia or lesions that are abnormalities of the columnar cells.  These tend to be benign and at surgery, upgrade to malignancy can be 0 to 5%.  It is for this reason some patients to undergo surgery others undergo observation.  Breast cancer risk: FEA can be precursors to low-grade DCIS (relative risk 1.47).  Another study did not find any increased risk for breast cancer.  Patient will have lumpectomy at the end of July.  We will review  the final pathology and then discuss if there is any role for antiestrogen therapy. Patient has 3 paternal aunts who had breast cancer.  1.  Risk assessment: ADondra Spry model: 5-year risk: 0.4% (average risk 0.4%) B.  Tyrer-Cuzick model: 10-year risk: 2.4% (average risk 1.6%) C.  Tyrer-Cuzick model: Lifetime risk 15.2% (average risk 10.8%)  2. Risk reduction: A.  Pharmacological risk reduction: Tamoxifen versus raloxifene (if the final pathology reveals precancerous lesions then I would recommend tamoxifen) B.  Nonpharmacological risk reduction: Stress importance of eating healthy, diet, exercise, supplements like vitamin D and turmeric, avoiding alcohol  3.  Breast cancer surveillance: A.  Mammograms annually B.  No  role of MRIs given the lifetime risk is being 15%.    Return to clinic 2 weeks after surgery to discuss pathology report and determine if tamoxifen would be necessary   All questions were answered. The patient knows to call the clinic with any problems, questions or concerns.    Tamsen Meek, MD 08/11/22

## 2022-08-11 NOTE — Assessment & Plan Note (Signed)
06/30/2022: Right breast biopsy: Usual ductal hyperplasia, focal flat epithelial atypia and fibrocystic changes Pathology counseling: Focal epithelial atypia or lesions that are abnormalities of the columnar cells.  These tend to be benign and at surgery, upgrade to malignancy can be 0 to 5%.  It is for this reason some patients to undergo surgery others undergo observation.  Breast cancer risk: FEA can be precursors to low-grade DCIS (relative risk 1.47).  Another study did not find any increased risk for breast cancer.  Risk reduction: I discussed with the patient the risks and benefits of raloxifene versus tamoxifen for breast cancer risk reduction.

## 2022-08-30 ENCOUNTER — Other Ambulatory Visit: Payer: Self-pay

## 2022-10-10 ENCOUNTER — Other Ambulatory Visit: Payer: Self-pay

## 2022-10-10 ENCOUNTER — Encounter (HOSPITAL_BASED_OUTPATIENT_CLINIC_OR_DEPARTMENT_OTHER): Payer: Self-pay | Admitting: General Surgery

## 2022-10-13 ENCOUNTER — Ambulatory Visit
Admission: RE | Admit: 2022-10-13 | Discharge: 2022-10-13 | Disposition: A | Discharge status: Home / Self Care | Payer: No Typology Code available for payment source | Source: Ambulatory Visit | Attending: General Surgery | Admitting: General Surgery

## 2022-10-13 ENCOUNTER — Other Ambulatory Visit: Payer: Self-pay | Admitting: General Surgery

## 2022-10-13 DIAGNOSIS — N6081 Other benign mammary dysplasias of right breast: Secondary | ICD-10-CM

## 2022-10-13 HISTORY — PX: BREAST BIOPSY: SHX20

## 2022-10-13 NOTE — Progress Notes (Signed)
       Patient Instructions  The night before surgery:  No food after midnight. ONLY clear liquids after midnight  The day of surgery (if you do NOT have diabetes):  Drink ONE (1) Pre-Surgery Clear Ensure as directed.   This drink was given to you during your hospital  pre-op appointment visit. The pre-op nurse will instruct you on the time to drink the  Pre-Surgery Ensure depending on your surgery time. Finish the drink at the designated time by the pre-op nurse.  Nothing else to drink after completing the  Pre-Surgery Clear Ensure.           If you have questions, please contact your surgeon's office.Gave patient CHG soap with instructions, patient verbalized understanding.

## 2022-10-14 ENCOUNTER — Other Ambulatory Visit: Payer: Self-pay

## 2022-10-14 ENCOUNTER — Ambulatory Visit
Admission: RE | Admit: 2022-10-14 | Discharge: 2022-10-14 | Disposition: A | Discharge status: Home / Self Care | Payer: No Typology Code available for payment source | Source: Ambulatory Visit | Attending: General Surgery | Admitting: General Surgery

## 2022-10-14 ENCOUNTER — Ambulatory Visit (HOSPITAL_BASED_OUTPATIENT_CLINIC_OR_DEPARTMENT_OTHER)
Admission: RE | Admit: 2022-10-14 | Discharge: 2022-10-14 | Disposition: A | Discharge status: Home / Self Care | Payer: No Typology Code available for payment source | Attending: General Surgery | Admitting: General Surgery

## 2022-10-14 ENCOUNTER — Encounter (HOSPITAL_BASED_OUTPATIENT_CLINIC_OR_DEPARTMENT_OTHER)
Admission: RE | Disposition: A | Discharge status: Home / Self Care | Payer: Self-pay | Source: Home / Self Care | Attending: General Surgery

## 2022-10-14 ENCOUNTER — Ambulatory Visit (HOSPITAL_BASED_OUTPATIENT_CLINIC_OR_DEPARTMENT_OTHER): Payer: No Typology Code available for payment source | Admitting: Anesthesiology

## 2022-10-14 ENCOUNTER — Encounter (HOSPITAL_BASED_OUTPATIENT_CLINIC_OR_DEPARTMENT_OTHER): Payer: Self-pay | Admitting: General Surgery

## 2022-10-14 DIAGNOSIS — Z01818 Encounter for other preprocedural examination: Secondary | ICD-10-CM

## 2022-10-14 DIAGNOSIS — N6081 Other benign mammary dysplasias of right breast: Secondary | ICD-10-CM

## 2022-10-14 DIAGNOSIS — N6021 Fibroadenosis of right breast: Secondary | ICD-10-CM | POA: Insufficient documentation

## 2022-10-14 DIAGNOSIS — D241 Benign neoplasm of right breast: Secondary | ICD-10-CM | POA: Insufficient documentation

## 2022-10-14 DIAGNOSIS — I10 Essential (primary) hypertension: Secondary | ICD-10-CM | POA: Insufficient documentation

## 2022-10-14 DIAGNOSIS — R920 Mammographic microcalcification found on diagnostic imaging of breast: Secondary | ICD-10-CM | POA: Insufficient documentation

## 2022-10-14 DIAGNOSIS — F32A Depression, unspecified: Secondary | ICD-10-CM | POA: Insufficient documentation

## 2022-10-14 DIAGNOSIS — Z79899 Other long term (current) drug therapy: Secondary | ICD-10-CM | POA: Insufficient documentation

## 2022-10-14 DIAGNOSIS — Z803 Family history of malignant neoplasm of breast: Secondary | ICD-10-CM | POA: Insufficient documentation

## 2022-10-14 DIAGNOSIS — F419 Anxiety disorder, unspecified: Secondary | ICD-10-CM | POA: Insufficient documentation

## 2022-10-14 HISTORY — PX: BREAST LUMPECTOMY WITH RADIOACTIVE SEED LOCALIZATION: SHX6424

## 2022-10-14 LAB — POCT PREGNANCY, URINE: Preg Test, Ur: NEGATIVE

## 2022-10-14 SURGERY — BREAST LUMPECTOMY WITH RADIOACTIVE SEED LOCALIZATION
Anesthesia: General | Site: Breast | Laterality: Right

## 2022-10-14 MED ORDER — OXYCODONE HCL 5 MG PO TABS: 5.0000 mg | ORAL_TABLET | Freq: Once | ORAL | Status: DC | PRN

## 2022-10-14 MED ORDER — OXYCODONE HCL 5 MG PO TABS
5.0000 mg | ORAL_TABLET | Freq: Four times a day (QID) | ORAL | 0 refills | Status: DC | PRN
  Filled 2022-10-14: qty 10, 3d supply, fill #0

## 2022-10-14 MED ORDER — LACTATED RINGERS IV SOLN: INTRAVENOUS | Status: DC

## 2022-10-14 MED ORDER — MIDAZOLAM HCL 5 MG/5ML IJ SOLN
INTRAMUSCULAR | Status: DC | PRN
  Administered 2022-10-14: 2 mg via INTRAVENOUS

## 2022-10-14 MED ORDER — DEXAMETHASONE SODIUM PHOSPHATE 10 MG/ML IJ SOLN
INTRAMUSCULAR | Status: AC
  Filled 2022-10-14: qty 1

## 2022-10-14 MED ORDER — ONDANSETRON HCL 4 MG/2ML IJ SOLN
INTRAMUSCULAR | Status: DC | PRN
  Administered 2022-10-14: 4 mg via INTRAVENOUS

## 2022-10-14 MED ORDER — PROPOFOL 10 MG/ML IV BOLUS
INTRAVENOUS | Status: DC | PRN
Start: 2022-10-14 — End: 2022-10-14
  Administered 2022-10-14: 150 mg via INTRAVENOUS

## 2022-10-14 MED ORDER — OXYCODONE HCL 5 MG/5ML PO SOLN: 5.0000 mg | Freq: Once | ORAL | Status: DC | PRN

## 2022-10-14 MED ORDER — MIDAZOLAM HCL 2 MG/2ML IJ SOLN
INTRAMUSCULAR | Status: AC
  Filled 2022-10-14: qty 2

## 2022-10-14 MED ORDER — FENTANYL CITRATE (PF) 100 MCG/2ML IJ SOLN
INTRAMUSCULAR | Status: AC
  Filled 2022-10-14: qty 2

## 2022-10-14 MED ORDER — FENTANYL CITRATE (PF) 100 MCG/2ML IJ SOLN: 25.0000 ug | INTRAMUSCULAR | Status: DC | PRN

## 2022-10-14 MED ORDER — CELECOXIB 200 MG PO CAPS
ORAL_CAPSULE | ORAL | Status: AC
  Filled 2022-10-14: qty 1

## 2022-10-14 MED ORDER — ATROPINE SULFATE 0.4 MG/ML IV SOLN
INTRAVENOUS | Status: AC
  Filled 2022-10-14: qty 1

## 2022-10-14 MED ORDER — LIDOCAINE HCL (CARDIAC) PF 100 MG/5ML IV SOSY
PREFILLED_SYRINGE | INTRAVENOUS | Status: DC | PRN
  Administered 2022-10-14: 50 mg via INTRAVENOUS

## 2022-10-14 MED ORDER — EPHEDRINE 5 MG/ML INJ
INTRAVENOUS | Status: AC
  Filled 2022-10-14: qty 5

## 2022-10-14 MED ORDER — GABAPENTIN 300 MG PO CAPS
ORAL_CAPSULE | ORAL | Status: AC
  Filled 2022-10-14: qty 1

## 2022-10-14 MED ORDER — ACETAMINOPHEN 160 MG/5ML PO SOLN: 1000.0000 mg | Freq: Once | ORAL | Status: DC | PRN

## 2022-10-14 MED ORDER — ACETAMINOPHEN 500 MG PO TABS
1000.0000 mg | ORAL_TABLET | ORAL | Status: AC
  Administered 2022-10-14: 1000 mg via ORAL

## 2022-10-14 MED ORDER — BUPIVACAINE-EPINEPHRINE (PF) 0.25% -1:200000 IJ SOLN
INTRAMUSCULAR | Status: DC | PRN
  Administered 2022-10-14: 20 mL

## 2022-10-14 MED ORDER — CELECOXIB 200 MG PO CAPS
200.0000 mg | ORAL_CAPSULE | ORAL | Status: AC
  Administered 2022-10-14: 200 mg via ORAL

## 2022-10-14 MED ORDER — LIDOCAINE 2% (20 MG/ML) 5 ML SYRINGE
INTRAMUSCULAR | Status: AC
  Filled 2022-10-14: qty 5

## 2022-10-14 MED ORDER — CHLORHEXIDINE GLUCONATE CLOTH 2 % EX PADS: 6.0000 | MEDICATED_PAD | Freq: Once | CUTANEOUS | Status: DC

## 2022-10-14 MED ORDER — CEFAZOLIN SODIUM-DEXTROSE 2-4 GM/100ML-% IV SOLN
2.0000 g | INTRAVENOUS | Status: AC
  Administered 2022-10-14: 2 g via INTRAVENOUS

## 2022-10-14 MED ORDER — ACETAMINOPHEN 500 MG PO TABS: 1000.0000 mg | ORAL_TABLET | Freq: Once | ORAL | Status: DC | PRN

## 2022-10-14 MED ORDER — ACETAMINOPHEN 10 MG/ML IV SOLN: 1000.0000 mg | Freq: Once | INTRAVENOUS | Status: DC | PRN

## 2022-10-14 MED ORDER — SUCCINYLCHOLINE CHLORIDE 200 MG/10ML IV SOSY
PREFILLED_SYRINGE | INTRAVENOUS | Status: AC
  Filled 2022-10-14: qty 10

## 2022-10-14 MED ORDER — FENTANYL CITRATE (PF) 100 MCG/2ML IJ SOLN
INTRAMUSCULAR | Status: DC | PRN
  Administered 2022-10-14: 50 ug via INTRAVENOUS

## 2022-10-14 MED ORDER — ACETAMINOPHEN 500 MG PO TABS
ORAL_TABLET | ORAL | Status: AC
  Filled 2022-10-14: qty 2

## 2022-10-14 MED ORDER — BUPIVACAINE-EPINEPHRINE (PF) 0.25% -1:200000 IJ SOLN
INTRAMUSCULAR | Status: AC
  Filled 2022-10-14: qty 30

## 2022-10-14 MED ORDER — 0.9 % SODIUM CHLORIDE (POUR BTL) OPTIME
TOPICAL | Status: DC | PRN
  Administered 2022-10-14: 1000 mL

## 2022-10-14 MED ORDER — DEXAMETHASONE SODIUM PHOSPHATE 4 MG/ML IJ SOLN
INTRAMUSCULAR | Status: DC | PRN
  Administered 2022-10-14: 5 mg via INTRAVENOUS

## 2022-10-14 MED ORDER — ONDANSETRON HCL 4 MG/2ML IJ SOLN
INTRAMUSCULAR | Status: AC
  Filled 2022-10-14: qty 2

## 2022-10-14 MED ORDER — CEFAZOLIN SODIUM-DEXTROSE 2-4 GM/100ML-% IV SOLN
INTRAVENOUS | Status: AC
  Filled 2022-10-14: qty 100

## 2022-10-14 MED ORDER — PHENYLEPHRINE 80 MCG/ML (10ML) SYRINGE FOR IV PUSH (FOR BLOOD PRESSURE SUPPORT)
PREFILLED_SYRINGE | INTRAVENOUS | Status: AC
  Filled 2022-10-14: qty 10

## 2022-10-14 MED ORDER — GABAPENTIN 300 MG PO CAPS
300.0000 mg | ORAL_CAPSULE | ORAL | Status: AC
  Administered 2022-10-14: 300 mg via ORAL

## 2022-10-14 SURGICAL SUPPLY — 39 items
ADH SKN CLS APL DERMABOND .7 (GAUZE/BANDAGES/DRESSINGS) ×1
APL PRP STRL LF DISP 70% ISPRP (MISCELLANEOUS) ×1
APPLIER CLIP 9.375 MED OPEN (MISCELLANEOUS)
APR CLP MED 9.3 20 MLT OPN (MISCELLANEOUS)
BLADE SURG 15 STRL LF DISP TIS (BLADE) ×1 IMPLANT
BLADE SURG 15 STRL SS (BLADE) ×1
CANISTER SUC SOCK COL 7IN (MISCELLANEOUS) ×1 IMPLANT
CANISTER SUCT 1200ML W/VALVE (MISCELLANEOUS) ×1 IMPLANT
CHLORAPREP W/TINT 26 (MISCELLANEOUS) ×1 IMPLANT
CLIP APPLIE 9.375 MED OPEN (MISCELLANEOUS) IMPLANT
COVER BACK TABLE 60X90IN (DRAPES) ×1 IMPLANT
COVER MAYO STAND STRL (DRAPES) ×1 IMPLANT
COVER PROBE CYLINDRICAL 5X96 (MISCELLANEOUS) ×1 IMPLANT
DERMABOND ADVANCED .7 DNX12 (GAUZE/BANDAGES/DRESSINGS) ×1 IMPLANT
DRAPE LAPAROSCOPIC ABDOMINAL (DRAPES) ×1 IMPLANT
DRAPE UTILITY XL STRL (DRAPES) ×1 IMPLANT
ELECT COATED BLADE 2.86 ST (ELECTRODE) ×1 IMPLANT
ELECT REM PT RETURN 9FT ADLT (ELECTROSURGICAL) ×1
ELECTRODE REM PT RTRN 9FT ADLT (ELECTROSURGICAL) ×1 IMPLANT
GLOVE BIO SURGEON STRL SZ7.5 (GLOVE) ×2 IMPLANT
GOWN STRL REUS W/ TWL LRG LVL3 (GOWN DISPOSABLE) ×2 IMPLANT
GOWN STRL REUS W/TWL LRG LVL3 (GOWN DISPOSABLE) ×3
KIT MARKER MARGIN INK (KITS) ×1 IMPLANT
NDL HYPO 25X1 1.5 SAFETY (NEEDLE) IMPLANT
NEEDLE HYPO 25X1 1.5 SAFETY (NEEDLE) ×1 IMPLANT
NS IRRIG 1000ML POUR BTL (IV SOLUTION) IMPLANT
PACK BASIN DAY SURGERY FS (CUSTOM PROCEDURE TRAY) ×1 IMPLANT
PENCIL SMOKE EVACUATOR (MISCELLANEOUS) ×1 IMPLANT
SLEEVE SCD COMPRESS KNEE MED (STOCKING) ×1 IMPLANT
SPIKE FLUID TRANSFER (MISCELLANEOUS) IMPLANT
SPONGE T-LAP 18X18 ~~LOC~~+RFID (SPONGE) ×1 IMPLANT
SUT MON AB 4-0 PC3 18 (SUTURE) ×1 IMPLANT
SUT SILK 2 0 SH (SUTURE) IMPLANT
SUT VICRYL 3-0 CR8 SH (SUTURE) ×1 IMPLANT
SYR CONTROL 10ML LL (SYRINGE) IMPLANT
TOWEL GREEN STERILE FF (TOWEL DISPOSABLE) ×1 IMPLANT
TRAY FAXITRON CT DISP (TRAY / TRAY PROCEDURE) ×1 IMPLANT
TUBE CONNECTING 20X1/4 (TUBING) ×1 IMPLANT
YANKAUER SUCT BULB TIP NO VENT (SUCTIONS) IMPLANT

## 2022-10-14 NOTE — Discharge Instructions (Addendum)
  Post Anesthesia Home Care Instructions  Activity: Get plenty of rest for the remainder of the day. A responsible individual must stay with you for 24 hours following the procedure.  For the next 24 hours, DO NOT: -Drive a car -Advertising copywriter -Drink alcoholic beverages -Take any medication unless instructed by your physician -Make any legal decisions or sign important papers.  Meals: Start with liquid foods such as gelatin or soup. Progress to regular foods as tolerated. Avoid greasy, spicy, heavy foods. If nausea and/or vomiting occur, drink only clear liquids until the nausea and/or vomiting subsides. Call your physician if vomiting continues.  Special Instructions/Symptoms: Your throat may feel dry or sore from the anesthesia or the breathing tube placed in your throat during surgery. If this causes discomfort, gargle with warm salt water. The discomfort should disappear within 24 hours.  If you had a scopolamine patch placed behind your ear for the management of post- operative nausea and/or vomiting:  1. The medication in the patch is effective for 72 hours, after which it should be removed.  Wrap patch in a tissue and discard in the trash. Wash hands thoroughly with soap and water. 2. You may remove the patch earlier than 72 hours if you experience unpleasant side effects which may include dry mouth, dizziness or visual disturbances. 3. Avoid touching the patch. Wash your hands with soap and water after contact with the patch.     No tylenol or ibuprofen until after 12:45 if needed today.

## 2022-10-14 NOTE — Anesthesia Preprocedure Evaluation (Signed)
Anesthesia Evaluation  Patient identified by MRN, date of birth, ID band Patient awake    Reviewed: Allergy & Precautions, H&P , NPO status , Patient's Chart, lab work & pertinent test results  Airway Mallampati: II  TM Distance: >3 FB Neck ROM: Full    Dental no notable dental hx. (+) Teeth Intact, Dental Advisory Given   Pulmonary neg pulmonary ROS   Pulmonary exam normal breath sounds clear to auscultation       Cardiovascular Exercise Tolerance: Good hypertension, Pt. on medications negative cardio ROS Normal cardiovascular exam Rhythm:Regular Rate:Normal     Neuro/Psych  Headaches PSYCHIATRIC DISORDERS Anxiety Depression     Neuromuscular disease negative neurological ROS  negative psych ROS   GI/Hepatic negative GI ROS, Neg liver ROS,,,  Endo/Other  negative endocrine ROS    Renal/GU negative Renal ROS  negative genitourinary   Musculoskeletal negative musculoskeletal ROS (+)    Abdominal   Peds negative pediatric ROS (+)  Hematology negative hematology ROS (+)   Anesthesia Other Findings   Reproductive/Obstetrics negative OB ROS                             Anesthesia Physical Anesthesia Plan  ASA: 2  Anesthesia Plan: General   Post-op Pain Management: Minimal or no pain anticipated   Induction: Intravenous  PONV Risk Score and Plan: 3 and Ondansetron, Dexamethasone and Treatment may vary due to age or medical condition  Airway Management Planned: Oral ETT and LMA  Additional Equipment:   Intra-op Plan:   Post-operative Plan: Extubation in OR  Informed Consent: I have reviewed the patients History and Physical, chart, labs and discussed the procedure including the risks, benefits and alternatives for the proposed anesthesia with the patient or authorized representative who has indicated his/her understanding and acceptance.       Plan Discussed with:  Anesthesiologist and CRNA  Anesthesia Plan Comments: (  )       Anesthesia Quick Evaluation

## 2022-10-14 NOTE — Transfer of Care (Signed)
Immediate Anesthesia Transfer of Care Note  Patient: Tracie [name]  Procedure(s) Performed: RIGHT BREAST LUMPECTOMY WITH RADIOACTIVE SEED LOCALIZATION X2 (Right: Breast)  Patient Location: PACU  Anesthesia Type:General  Level of Consciousness: awake, drowsy, and patient cooperative  Airway & Oxygen Therapy: Patient Spontanous Breathing and Patient connected to face mask oxygen  Post-op Assessment: Report given to RN and Post -op Vital signs reviewed and stable  Post vital signs: Reviewed and stable  Last Vitals:  Vitals Value Taken Time  BP    Temp    Pulse 87 10/14/22 0833  Resp    SpO2 99 % 10/14/22 0833  Vitals shown include unfiled device data.  Last Pain:  Vitals:   10/14/22 0642  TempSrc: Oral  PainSc: 0-No pain      Patients Stated Pain Goal: 7 (10/14/22 4696)  Complications: No notable events documented.

## 2022-10-14 NOTE — Interval H&P Note (Signed)
History and Physical Interval Note:  10/14/2022 7:14 AM  Tracie [name]  has presented today for surgery, with the diagnosis of RIGHT BREAST ATYPIA X2.  The various methods of treatment have been discussed with the patient and family. After consideration of risks, benefits and other options for treatment, the patient has consented to  Procedure(s): RIGHT BREAST LUMPECTOMY WITH RADIOACTIVE SEED LOCALIZATION X2 (Right) as a surgical intervention.  The patient's history has been reviewed, patient examined, no change in status, stable for surgery.  I have reviewed the patient's chart and labs.  Questions were answered to the patient's satisfaction.     Chevis Pretty III

## 2022-10-14 NOTE — Op Note (Signed)
10/14/2022  8:28 AM  PATIENT:  Tracie [name]  42 y.o. female  PRE-OPERATIVE DIAGNOSIS:  RIGHT BREAST ATYPIA X2  POST-OPERATIVE DIAGNOSIS:  RIGHT BREAST ATYPIA X2  PROCEDURE:  Procedure(s): RIGHT BREAST LUMPECTOMY WITH RADIOACTIVE SEED LOCALIZATION X2 (Right)  SURGEON:  Surgeons and Role:    * Griselda Miner, MD - Primary  PHYSICIAN ASSISTANT:   ASSISTANTS: none   ANESTHESIA:   local and general  EBL:  10 mL   BLOOD ADMINISTERED:none  DRAINS: none   LOCAL MEDICATIONS USED:  MARCAINE     SPECIMEN:  Source of Specimen:  right breast tissue medial and lateral  DISPOSITION OF SPECIMEN:  PATHOLOGY  COUNTS:  YES  TOURNIQUET:  * No tourniquets in log *  DICTATION: .Dragon Dictation  After informed consent was obtained the patient was brought to the operating room and placed in the supine position on the operating table.  After adequate induction of general anesthesia the patient's right breast was prepped with ChloraPrep, allowed to dry, and draped in usual sterile manner.  An appropriate timeout was performed.  Previously 2 I-125 seeds were placed in the upper outer aspect of the right breast to mark areas of flat epithelial atypia.  The neoprobe was set to I-125 in the area of the 2 seeds was readily identified.  The area around this was infiltrated with quarter percent Marcaine.  A curvilinear incision was made along the upper outer edge of the areola with a 15 blade knife.  The incision was carried through the skin and subcutaneous tissue sharply with the electrocautery.  Dissection was then carried towards the more medial seed initially under the direction of the neoprobe.  Once I more closely approach the radioactive seed I then removed a circular portion of breast tissue sharply with the electrocautery around the radioactive seed while checking the area of radioactivity frequently.  Once the specimen was removed it was oriented with the appropriate paint colors.  A  specimen radiograph was obtained that showed the clip and seed to be near the center of the specimen.  The specimen was then sent to pathology for further evaluation.  Attention was then turned to the more lateral seed.  Again dissection was carried through the breast tissue sharply with the electrocautery under the direction of the neoprobe.  Once I more closely approach the more lateral seed I then removed a circular portion of breast tissue sharply with the electrocautery around the radioactive seed while checking the area of radioactivity frequently.  Once the specimen was removed it was oriented with the appropriate paint colors.  A specimen radiograph was obtained that showed the clip and seed to be within the specimen.  The specimen was then sent to pathology for further evaluation.  Hemostasis was achieved using the Bovie electrocautery.  The wound was irrigated with saline and infiltrated with more quarter percent Marcaine.  The deep layer of the wound was then closed with layers of interrupted 3-0 Vicryl stitches.  The skin was then closed with interrupted 4-0 Monocryl subcuticular stitches.  Dermabond dressings were applied.  The patient tolerated the procedure well.  At the end of the case all needle sponge and instrument counts were correct.  The patient was then awakened and taken to recovery in stable condition.  PLAN OF CARE: Discharge to home after PACU  PATIENT DISPOSITION:  PACU - hemodynamically stable.   Delay start of Pharmacological VTE agent (>24hrs) due to surgical blood loss or risk of bleeding: not applicable

## 2022-10-14 NOTE — Anesthesia Procedure Notes (Signed)
Procedure Name: LMA Insertion Date/Time: 10/14/2022 7:38 AM  Performed by: Ronnette Hila, CRNAPre-anesthesia Checklist: Patient identified, Emergency Drugs available, Suction available and Patient being monitored Patient Re-evaluated:Patient Re-evaluated prior to induction Oxygen Delivery Method: Circle System Utilized Preoxygenation: Pre-oxygenation with 100% oxygen Induction Type: IV induction Ventilation: Mask ventilation without difficulty LMA: LMA inserted LMA Size: 4.0 Number of attempts: 1 Airway Equipment and Method: bite block Placement Confirmation: positive ETCO2 Tube secured with: Tape Dental Injury: Teeth and Oropharynx as per pre-operative assessment

## 2022-10-14 NOTE — Anesthesia Postprocedure Evaluation (Signed)
Anesthesia Post Note  Patient: Tracie [name]  Procedure(s) Performed: RIGHT BREAST LUMPECTOMY WITH RADIOACTIVE SEED LOCALIZATION X2 (Right: Breast)     Patient location during evaluation: PACU Anesthesia Type: General Level of consciousness: awake and alert Pain management: pain level controlled Vital Signs Assessment: post-procedure vital signs reviewed and stable Respiratory status: spontaneous breathing, nonlabored ventilation and respiratory function stable Cardiovascular status: blood pressure returned to baseline and stable Postop Assessment: no apparent nausea or vomiting Anesthetic complications: no  No notable events documented.  Last Vitals:  Vitals:   10/14/22 0845 10/14/22 0915  BP: 109/65 124/66  Pulse: 89 76  Resp: 19 16  Temp:  (!) 36.2 C  SpO2: 98% 100%    Last Pain:  Vitals:   10/14/22 0915  TempSrc:   PainSc: 0-No pain                 Davian Hanshaw

## 2022-10-14 NOTE — H&P (Signed)
REFERRING PHYSICIAN: Constant, Peggy, MD PROVIDER: Lindell Noe, MD MRN: [id number] DOB: March 27, 1980 Subjective   Chief Complaint: NEW BREAST ( RIGHT breast FOCAL FLAT EPITHELIAL ATYPIA)  History of Present Illness: Tracie [name] is a 42 y.o. female who is seen today as an office consultation for evaluation of NEW BREAST ( RIGHT breast FOCAL FLAT EPITHELIAL ATYPIA)  We are asked to see the patient in consultation by Dr. Gigi Gin constant to evaluate her for 2 areas of atypia in the right breast. The patient is a 42 year old Hispanic female who recently went for her first mammogram. At that time she was found to have 2 small masses in the upper outer quadrant of the right breast measuring 8 mm and 10 mm. Both were biopsied and came back as flat epithelial atypia. She is otherwise in good health and does not smoke. She does have a family history of breast cancer in a paternal aunt.  Review of Systems: A complete review of systems was obtained from the patient. I have reviewed this information and discussed as appropriate with the patient. See HPI as well for other ROS.  ROS   Medical History: History reviewed. No pertinent past medical history.  Patient Active Problem List  Diagnosis  Flat epithelial atypia of breast, right   History reviewed. No pertinent surgical history.   No Known Allergies  No current outpatient medications on file prior to visit.   No current facility-administered medications on file prior to visit.   History reviewed. No pertinent family history.   Social History   Tobacco Use  Smoking Status Not on file  Smokeless Tobacco Not on file    Social History   Socioeconomic History  Marital status: Single   Social Determinants of Health   Food Insecurity: No Food Insecurity (06/09/2022)  Received [facility]  Hunger Vital Sign  Worried About Running Out of Food in the Last Year: Never true  Ran Out of Food in the Last Year: Never true   Transportation Needs: No Transportation Needs (06/09/2022)  Received from [facility] - Transportation  Lack of Transportation (Medical): No  Lack of Transportation (Non-Medical): No   Objective:   Vitals:  BP: 119/78  Pulse: 94  Temp: 37.3 C (99.1 F)  SpO2: 96%  Weight: 76.8 kg (169 lb 6.4 oz)  Height: 160 cm (5\' 3" )  PainSc: 5  PainLoc: Breast   Body mass index is 30.01 kg/m.  Physical Exam Vitals reviewed.  Constitutional:  General: She is not in acute distress. Appearance: Normal appearance.  HENT:  Head: Normocephalic and atraumatic.  Right Ear: External ear normal.  Left Ear: External ear normal.  Nose: Nose normal.  Mouth/Throat:  Mouth: Mucous membranes are moist.  Pharynx: Oropharynx is clear.  Eyes:  General: No scleral icterus. Extraocular Movements: Extraocular movements intact.  Conjunctiva/sclera: Conjunctivae normal.  Pupils: Pupils are equal, round, and reactive to light.  Cardiovascular:  Rate and Rhythm: Normal rate and regular rhythm.  Pulses: Normal pulses.  Heart sounds: Normal heart sounds.  Pulmonary:  Effort: Pulmonary effort is normal. No respiratory distress.  Breath sounds: Normal breath sounds.  Abdominal:  General: Bowel sounds are normal.  Palpations: Abdomen is soft.  Tenderness: There is no abdominal tenderness.  Musculoskeletal:  General: No swelling, tenderness or deformity. Normal range of motion.  Cervical back: Normal range of motion and neck supple.  Skin: General: Skin is warm and dry.  Coloration: Skin is not jaundiced.  Neurological:  General: No focal  deficit present.  Mental Status: She is alert and oriented to person, place, and time.  Psychiatric:  Mood and Affect: Mood normal.  Behavior: Behavior normal.     Breast: There is no palpable mass in either breast. There is no palpable axillary, supraclavicular, or cervical lymphadenopathy.  Labs, Imaging and Diagnostic Testing:  Assessment and  Plan:   Diagnoses and all orders for this visit:  Flat epithelial atypia of breast, right   The patient appears to have 2 small areas of atypia in the upper outer quadrant of the right breast. Because these areas could be considered high risk and because of their appearance the recommendation is to have these areas removed to make sure that we are not missing anything more significant. I have discussed with her in detail the risks and benefits of the operation as well as some of the technical aspects including use of radioactive seeds for localization and she understands and wishes to proceed. I will also refer her to the high risk clinic at the cancer center to talk about risk reduction. We will proceed with surgical scheduling

## 2022-10-17 ENCOUNTER — Encounter (HOSPITAL_BASED_OUTPATIENT_CLINIC_OR_DEPARTMENT_OTHER): Payer: Self-pay | Admitting: General Surgery

## 2022-10-24 NOTE — Progress Notes (Signed)
   Patient Care Team: Claiborne Rigg, NP as PCP - General (Nurse Practitioner)  DIAGNOSIS: No diagnosis found.  SUMMARY OF ONCOLOGIC HISTORY: Oncology History   No history exists.    CHIEF COMPLIANT: Follow-up after surgery  INTERVAL HISTORY: Tracie [name] is a 42 y.o. 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 She presents to the clinic for a follow-up.     ALLERGIES:  has No Known Allergies.  MEDICATIONS:  Current Outpatient Medications  Medication Sig Dispense Refill   hydrOXYzine (ATARAX) 25 MG tablet Take 1/2 to 1 tablet by mouth every 8 hours as needed for anxiety 60 tablet 2   oxyCODONE (ROXICODONE) 5 MG immediate release tablet Take 1 tablet (5 mg total) by mouth every 6 (six) hours as needed for severe pain. 10 tablet 0   No current facility-administered medications for this visit.    PHYSICAL EXAMINATION: ECOG PERFORMANCE STATUS: {CHL ONC ECOG PS:[id number]}  There were no vitals filed for this visit. There were no vitals filed for this visit.  BREAST:*** No palpable masses or nodules in either right or left breasts. No palpable axillary supraclavicular or infraclavicular adenopathy no breast tenderness or nipple discharge. (exam performed in the presence of a chaperone)  LABORATORY DATA:  I have reviewed the data as listed    Latest Ref Rng & Units 02/15/2022    5:53 PM 02/03/2021    8:48 AM 08/12/2020   11:20 AM  CMP  Glucose 70 - 99 mg/dL 87  409  85   BUN 6 - 20 mg/dL 10  12  10    Creatinine 0.57 - 1.00 mg/dL 8.11  9.14  7.82   Sodium 134 - 144 mmol/L 140  143  137   Potassium 3.5 - 5.2 mmol/L 4.2  4.8  4.2   Chloride 96 - 106 mmol/L 101  106  98   CO2 20 - 29 mmol/L 22  22  23    Calcium 8.7 - 10.2 mg/dL 95.6  9.8  21.3   Total Protein 6.0 - 8.5 g/dL 7.2  7.4  7.5   Total Bilirubin 0.0 - 1.2 mg/dL <0.8  0.3  0.3   Alkaline Phos 44 - 121 IU/L 69  63  67   AST 0 - 40 IU/L 13   13  20    ALT 0 - 32 IU/L 12  14  43     Lab Results  Component Value Date   WBC 7.3 02/15/2022   HGB 12.7 02/15/2022   HCT 40.4 02/15/2022   MCV 86 02/15/2022   PLT 329 02/15/2022   NEUTROABS 4.1 08/12/2020    ASSESSMENT & PLAN:  No problem-specific Assessment & Plan notes found for this encounter.    No orders of the defined types were placed in this encounter.  The patient has a good understanding of the overall plan. she agrees with it. she will call with any problems that may develop before the next visit here. Total time spent: 30 mins including face to face time and time spent for planning, charting and co-ordination of care   Sherlyn Lick, CMA 10/24/22    I Janan Ridge am acting as a Neurosurgeon for [employer]  ***

## 2022-10-25 ENCOUNTER — Other Ambulatory Visit: Payer: Self-pay

## 2022-10-25 ENCOUNTER — Inpatient Hospital Stay
Payer: No Typology Code available for payment source | Attending: Hematology and Oncology | Admitting: Hematology and Oncology

## 2022-10-25 VITALS — BP 123/88 | HR 98 | Temp 97.9°F | Resp 18 | Ht 64.0 in | Wt 164.8 lb

## 2022-10-25 DIAGNOSIS — Z803 Family history of malignant neoplasm of breast: Secondary | ICD-10-CM | POA: Insufficient documentation

## 2022-10-25 DIAGNOSIS — D241 Benign neoplasm of right breast: Secondary | ICD-10-CM | POA: Insufficient documentation

## 2022-10-25 NOTE — Assessment & Plan Note (Signed)
10/14/2022: Right medial lumpectomy: Benign fibroadenoma, benign intraductal micro papilloma.  Negative for carcinoma, right lateral lumpectomy: Benign fibroadenoma, UDH, benign intraductal micro papilloma  Patient has 3 paternal aunts who had breast cancer.   1.  Risk assessment: ADondra Spry model: 5-year risk: 0.4% (average risk 0.4%) B.  Tyrer-Cuzick model: 10-year risk: 2.4% (average risk 1.6%) C.  Tyrer-Cuzick model: Lifetime risk 15.2% (average risk 10.8%)  2. risk reduction: Did not recommend antiestrogen therapy.  Nonpharmacological risk reduction measures were once again discussed.  Surveillance for breast cancer: Annual mammograms. Breast MRIs are not necessary since her lifetime risk is 15%.  Return to clinic on an as-needed basis.

## 2022-10-27 ENCOUNTER — Ambulatory Visit: Payer: No Typology Code available for payment source | Admitting: Hematology and Oncology

## 2022-12-06 ENCOUNTER — Ambulatory Visit
Admission: RE | Admit: 2022-12-06 | Discharge: 2022-12-06 | Disposition: A | Discharge status: Home / Self Care | Payer: Self-pay | Source: Ambulatory Visit | Attending: Internal Medicine | Admitting: Internal Medicine

## 2022-12-06 ENCOUNTER — Other Ambulatory Visit: Payer: Self-pay

## 2022-12-06 ENCOUNTER — Ambulatory Visit (INDEPENDENT_AMBULATORY_CARE_PROVIDER_SITE_OTHER): Payer: Self-pay

## 2022-12-06 VITALS — BP 132/79 | HR 92 | Temp 98.2°F | Resp 16

## 2022-12-06 DIAGNOSIS — R0602 Shortness of breath: Secondary | ICD-10-CM

## 2022-12-06 DIAGNOSIS — R350 Frequency of micturition: Secondary | ICD-10-CM | POA: Insufficient documentation

## 2022-12-06 DIAGNOSIS — R5383 Other fatigue: Secondary | ICD-10-CM | POA: Insufficient documentation

## 2022-12-06 LAB — POCT URINALYSIS DIP (MANUAL ENTRY)
Bilirubin, UA: NEGATIVE
Blood, UA: NEGATIVE
Glucose, UA: NEGATIVE mg/dL
Ketones, POC UA: NEGATIVE mg/dL
Nitrite, UA: NEGATIVE
Protein Ur, POC: NEGATIVE mg/dL
Spec Grav, UA: 1.02 (ref 1.010–1.025)
Urobilinogen, UA: 0.2 U/dL
pH, UA: 7 (ref 5.0–8.0)

## 2022-12-06 LAB — POCT URINE PREGNANCY: Preg Test, Ur: NEGATIVE

## 2022-12-06 NOTE — Discharge Instructions (Signed)
Please follow-up with primary care doctor.  I will call with x-ray results.  Blood work is pending.

## 2022-12-06 NOTE — ED Provider Notes (Signed)
[facility]    CSN: [id number] Arrival date & time: 12/06/22  1458      History   Chief Complaint Chief Complaint  Patient presents with   Fatigue    Entered by patient    HPI Tracie [name] is a 42 y.o. female.   Patient resents with approximately 3-week history of fatigue, shortness of breath, numbness and tingling in bilateral hands.  States that this happened multiple years ago and she was told that her vitamin D was low.  Reports fatigue is interacting with her daily activities.  Denies chest pain, abdominal pain, nausea, vomiting, diarrhea.  Patient having normal bowel movements with no blood in stool.  Denies abnormal vaginal bleeding.  Last menstrual cycle was 11/22/2022.  Patient reports that she has intermittent urinary frequency but denies dysuria.  Denies history of any chronic pulmonary or cardiac problems.  She takes hydroxyzine as needed for anxiety but denies any other chronic health problems.     Past Medical History:  Diagnosis Date   Anxiety    Depression    Headache     Patient Active Problem List   Diagnosis Date Noted   Intraductal papilloma of right breast 10/25/2022   Menstrual migraine without status migrainosus, not intractable 08/02/2022   Asymptomatic microscopic hematuria 08/02/2022   Flat epithelial atypia of breast, right 07/20/2022   Transient hypertension 07/06/2022   Gallstones 07/22/2021   Sciatic pain, right 04/30/2020   Biological false positive RPR test 04/05/2020   Language barrier 04/02/2020   Anxiety and depression 06/12/2014   Generalized anxiety disorder 06/12/2014   Abnormal uterine bleeding (AUB) 12/09/2013   Bilateral chronic knee pain 10/28/2013    Past Surgical History:  Procedure Laterality Date   BREAST BIOPSY Right 06/30/2022   [place] RT BREAST BX W LOC DEV EA ADD LESION IMG BX SPEC US GUIDE 06/30/2022 [facility]   BREAST BIOPSY Right 06/30/2022   [place] RT BREAST BX W LOC DEV EA ADD LESION IMG BX  SPEC US GUIDE 06/30/2022 [facility]   BREAST BIOPSY Right 06/30/2022   [place] RT BREAST BX W LOC DEV 1ST LESION IMG BX SPEC US GUIDE 06/30/2022 [facility]   BREAST BIOPSY  10/13/2022   [place] RT RADIOACTIVE SEED LOC 10/13/2022 [facility]   BREAST BIOPSY Right 10/13/2022   [place] RT RADIOACTIVE SEED EA ADD LESION 10/13/2022 [facility]   BREAST LUMPECTOMY WITH RADIOACTIVE SEED LOCALIZATION Right 10/14/2022   Procedure: RIGHT BREAST LUMPECTOMY WITH RADIOACTIVE SEED LOCALIZATION X2;  Surgeon: Griselda Miner, MD;  Location: [facility];  Service: General;  Laterality: Right;    OB History     Gravida  2   Para  2   Term  2   Preterm      AB      Living  2      SAB  0   IAB  0   Ectopic      Multiple  0   Live Births  2            Home Medications    Prior to Admission medications   Medication Sig Start Date End Date Taking? Authorizing Provider  hydrOXYzine (ATARAX) 25 MG tablet Take 1/2 to 1 tablet by mouth every 8 hours as needed for anxiety 05/16/22  Yes Claiborne Rigg, NP  oxyCODONE (ROXICODONE) 5 MG immediate release tablet Take 1 tablet (5 mg total) by mouth every 6 (six) hours as needed for severe pain. 10/14/22  Griselda Miner, MD    Family History Family History  Problem Relation Age of Onset   Breast cancer Paternal Aunt    Breast cancer Paternal Aunt    Hypertension Neg Hx    Colon cancer Neg Hx    Esophageal cancer Neg Hx    Pancreatic cancer Neg Hx    Stomach cancer Neg Hx    Liver disease Neg Hx     Social History Social History   Tobacco Use   Smoking status: Never   Smokeless tobacco: Never  Vaping Use   Vaping status: Never Used  Substance Use Topics   Alcohol use: No   Drug use: No     Allergies   Patient has no known allergies.   Review of Systems Review of Systems Per HPI  Physical Exam Triage Vital Signs ED Triage Vitals [12/06/22 1538]  Encounter Vitals Group     BP 132/79      Systolic BP Percentile      Diastolic BP Percentile      Pulse Rate 92     Resp 16     Temp 98.2 F (36.8 C)     Temp Source Oral     SpO2 94 %     Weight      Height      Head Circumference      Peak Flow      Pain Score      Pain Loc      Pain Education      Exclude from Growth Chart    No data found.  Updated Vital Signs BP 132/79 (BP Location: Left Arm)   Pulse 92   Temp 98.2 F (36.8 C) (Oral)   Resp 16   LMP 11/22/2022   SpO2 94%   Visual Acuity Right Eye Distance:   Left Eye Distance:   Bilateral Distance:    Right Eye Near:   Left Eye Near:    Bilateral Near:     Physical Exam Constitutional:      General: She is not in acute distress.    Appearance: Normal appearance. She is not toxic-appearing or diaphoretic.  HENT:     Head: Normocephalic and atraumatic.  Eyes:     Extraocular Movements: Extraocular movements intact.     Conjunctiva/sclera: Conjunctivae normal.     Pupils: Pupils are equal, round, and reactive to light.  Cardiovascular:     Rate and Rhythm: Normal rate and regular rhythm.     Pulses: Normal pulses.     Heart sounds: Normal heart sounds.  Pulmonary:     Effort: Pulmonary effort is normal. No respiratory distress.     Breath sounds: Normal breath sounds.  Abdominal:     General: Bowel sounds are normal. There is no distension.     Palpations: Abdomen is soft.     Tenderness: There is no abdominal tenderness.  Musculoskeletal:        General: Normal range of motion.     Cervical back: Normal range of motion.  Skin:    General: Skin is warm and dry.  Neurological:     General: No focal deficit present.     Mental Status: She is alert and oriented to person, place, and time. Mental status is at baseline.     Cranial Nerves: Cranial nerves 2-12 are intact.     Sensory: Sensation is intact.     Motor: Motor function is intact.     Coordination: Coordination is intact.  Gait: Gait is intact.  Psychiatric:        Mood and  Affect: Mood normal.        Behavior: Behavior normal.        Thought Content: Thought content normal.        Judgment: Judgment normal.      UC Treatments / Results  Labs (all labs ordered are listed, but only abnormal results are displayed) Labs Reviewed  POCT URINALYSIS DIP (MANUAL ENTRY) - Abnormal; Notable for the following components:      Result Value   Leukocytes, UA Trace (*)    All other components within normal limits  URINE CULTURE  CBC  COMPREHENSIVE METABOLIC PANEL  TSH  T4, FREE  T3, FREE  POCT URINE PREGNANCY    EKG   Radiology DG Chest 2 View  Result Date: 12/06/2022 CLINICAL DATA:  Shortness of breath EXAM: CHEST - 2 VIEW COMPARISON:  None Available. FINDINGS: The heart size and mediastinal contours are within normal limits. Both lungs are clear. The visualized skeletal structures are unremarkable. Artifact from patient's clothing overlies the mid to lower chest wall anteriorly. IMPRESSION: No active cardiopulmonary disease. Electronically Signed   By: Duanne Guess D.O.   On: 12/06/2022 17:52    Procedures Procedures (including critical care time)  Medications Ordered in UC Medications - No data to display  Initial Impression / Assessment and Plan / UC Course  I have reviewed the triage vital signs and the nursing notes.  Pertinent labs & imaging results that were available during my care of the patient were reviewed by me and considered in my medical decision making (see chart for details).     EKG completed that was normal sinus rhythm.  Chest x-ray negative for any acute cardiopulmonary process.  Differential diagnoses include electrolyte abnormalities versus vitamin abnormality versus anxiety versus anemia.  CMP, CBC, thyroid panel pending.  Awaiting results.  Discussed with patient that we do not routinely check vitamin abnormalities at urgent care.  She has established PCP so encouraged to follow-up with them for further evaluation and to have  this checked..  Advised strict return and ER precautions.  Patient's vital signs are normal and she is in no acute distress so do not think that emergent evaluation is necessary.  Patient verbalized understanding and was agreeable with plan. Interpreter used throughout patient interaction. Final Clinical Impressions(s) / UC Diagnoses   Final diagnoses:  Urinary frequency  Other fatigue  Shortness of breath     Discharge Instructions      Please follow-up with primary care doctor.  I will call with x-ray results.  Blood work is pending.     ED Prescriptions   None    PDMP not reviewed this encounter.   Gustavus Bryant, [place] 12/06/22 [phone]

## 2022-12-06 NOTE — ED Triage Notes (Signed)
Pt is here for fatigue and sob x 3 weeks. Pt states she has body pain and chills.

## 2022-12-14 ENCOUNTER — Other Ambulatory Visit: Payer: Self-pay

## 2022-12-14 ENCOUNTER — Encounter: Payer: Self-pay | Admitting: Nurse Practitioner

## 2022-12-14 ENCOUNTER — Ambulatory Visit: Payer: Self-pay | Attending: Nurse Practitioner | Admitting: Nurse Practitioner

## 2022-12-14 VITALS — BP 114/76 | HR 99 | Ht 64.0 in | Wt 169.4 lb

## 2022-12-14 DIAGNOSIS — F419 Anxiety disorder, unspecified: Secondary | ICD-10-CM

## 2022-12-14 DIAGNOSIS — F32A Depression, unspecified: Secondary | ICD-10-CM

## 2022-12-14 DIAGNOSIS — R5382 Chronic fatigue, unspecified: Secondary | ICD-10-CM

## 2022-12-14 DIAGNOSIS — M5431 Sciatica, right side: Secondary | ICD-10-CM

## 2022-12-14 MED ORDER — METHOCARBAMOL 500 MG PO TABS
1000.0000 mg | ORAL_TABLET | Freq: Three times a day (TID) | ORAL | 3 refills | Status: DC | PRN
Start: 2022-12-14 — End: 2023-05-03
  Filled 2022-12-14: qty 60, 10d supply, fill #0

## 2022-12-14 MED ORDER — HYDROXYZINE HCL 25 MG PO TABS
ORAL_TABLET | ORAL | 2 refills | Status: DC
  Filled 2022-12-14: qty 60, 20d supply, fill #0
  Filled 2023-05-25 (×2): qty 60, 20d supply, fill #1

## 2022-12-14 MED ORDER — BUPROPION HCL ER (XL) 150 MG PO TB24
150.0000 mg | ORAL_TABLET | Freq: Every day | ORAL | 1 refills | Status: DC
Start: 2022-12-14 — End: 2023-05-29
  Filled 2022-12-14: qty 90, 90d supply, fill #0
  Filled 2023-03-07: qty 90, 90d supply, fill #1

## 2022-12-14 NOTE — Progress Notes (Addendum)
Assessment & Plan:  Tracie [name] was seen today for fatigue.  Diagnoses and all orders for this visit:  Chronic fatigue -     VITAMIN D 25 Hydroxy (Vit-D Deficiency, Fractures)  Anxiety and depression -     buPROPion (WELLBUTRIN XL) 150 MG 24 hr tablet; Take 1 tablet (150 mg total) by mouth daily. For Depression -     hydrOXYzine (ATARAX) 25 MG tablet; Take 1/2 to 1 tablet by mouth every 8 hours as needed for anxiety  Sciatic pain, right -     methocarbamol (ROBAXIN) 500 MG tablet; Take 2 tablets (1,000 mg total) by mouth every 8 (eight) hours as needed for muscle spasms.    Patient has been counseled on age-appropriate routine health concerns for screening and prevention. These are reviewed and up-to-date. Referrals have been placed accordingly. Immunizations are up-to-date or declined.    Subjective:   Chief Complaint  Patient presents with   Fatigue   HPI Tracie [name] 42 y.o. female presents to office today with complaints of fatigue.  VRI was used to communicate directly with patient for the entire encounter including providing detailed patient instructions.    Endorses chronic fatigue. Feels like her bones are aching. She went to the urgent care for the same symptoms last week and work up was negative.  She is requesting to restart her hydroxyzine however she has refills still at the pharmacy. She does have a history of anxiety and depression which may be contributing to her fatigue.She is requesting to start wellbutrin which her aunt takes.       12/14/2022    1:59 PM 08/01/2022    4:55 PM 07/06/2022    3:47 PM  Depression screen PHQ 2/9  Decreased Interest 0 1 1  Down, Depressed, Hopeless 0 1 1  PHQ - 2 Score 0 2 2  Altered sleeping 0 1 1  Tired, decreased energy 0 1 1  Change in appetite 0 1 1  Feeling bad or failure about yourself  0 1 1  Trouble concentrating 0 1 1  Moving slowly or fidgety/restless 0 1 1  Suicidal thoughts 0 0 0  PHQ-9 Score 0 8 8        12/14/2022    2:00 PM 08/01/2022    4:55 PM 07/06/2022    3:47 PM 02/17/2022    2:14 PM  GAD 7 : Generalized Anxiety Score  Nervous, Anxious, on Edge 0 1 1 1   Control/stop worrying 0 1 1 1   Worry too much - different things 0 1 1 1   Trouble relaxing 0 1 1 1   Restless 0 1 1 1   Easily annoyed or irritable 0 1 1 1   Afraid - awful might happen 0 1 1 1   Total GAD 7 Score 0 7 7 7      Review of Systems  Constitutional:  Positive for malaise/fatigue. Negative for fever and weight loss.  HENT: Negative.  Negative for nosebleeds.   Eyes: Negative.  Negative for blurred vision, double vision and photophobia.  Respiratory: Negative.  Negative for cough and shortness of breath.   Cardiovascular: Negative.  Negative for chest pain, palpitations and leg swelling.  Gastrointestinal: Negative.  Negative for heartburn, nausea and vomiting.  Musculoskeletal: Negative.  Negative for myalgias.  Neurological: Negative.  Negative for dizziness, focal weakness, seizures and headaches.  Psychiatric/Behavioral:  Positive for depression. Negative for suicidal ideas. The patient is nervous/anxious.     Past Medical History:  Diagnosis Date   Anxiety  Depression    Headache     Past Surgical History:  Procedure Laterality Date   BREAST BIOPSY Right 06/30/2022   [place] RT BREAST BX W LOC DEV EA ADD LESION IMG BX SPEC US GUIDE 06/30/2022 [facility]   BREAST BIOPSY Right 06/30/2022   [place] RT BREAST BX W LOC DEV EA ADD LESION IMG BX SPEC US GUIDE 06/30/2022 [facility]   BREAST BIOPSY Right 06/30/2022   [place] RT BREAST BX W LOC DEV 1ST LESION IMG BX SPEC US GUIDE 06/30/2022 [facility]   BREAST BIOPSY  10/13/2022   [place] RT RADIOACTIVE SEED LOC 10/13/2022 [facility]   BREAST BIOPSY Right 10/13/2022   [place] RT RADIOACTIVE SEED EA ADD LESION 10/13/2022 [facility]   BREAST LUMPECTOMY WITH RADIOACTIVE SEED LOCALIZATION Right 10/14/2022   Procedure: RIGHT BREAST LUMPECTOMY WITH RADIOACTIVE  SEED LOCALIZATION X2;  Surgeon: Griselda Miner, MD;  Location: [facility];  Service: General;  Laterality: Right;    Family History  Problem Relation Age of Onset   Breast cancer Paternal Aunt    Breast cancer Paternal Aunt    Hypertension Neg Hx    Colon cancer Neg Hx    Esophageal cancer Neg Hx    Pancreatic cancer Neg Hx    Stomach cancer Neg Hx    Liver disease Neg Hx     Social History Reviewed with no changes to be made today.   Outpatient Medications Prior to Visit  Medication Sig Dispense Refill   hydrOXYzine (ATARAX) 25 MG tablet Take 1/2 to 1 tablet by mouth every 8 hours as needed for anxiety 60 tablet 2   oxyCODONE (ROXICODONE) 5 MG immediate release tablet Take 1 tablet (5 mg total) by mouth every 6 (six) hours as needed for severe pain. (Patient not taking: Reported on 12/14/2022) 10 tablet 0   No facility-administered medications prior to visit.    No Known Allergies     Objective:    BP 114/76 (BP Location: Left Arm, Patient Position: Sitting, Cuff Size: Normal)   Pulse 99   Ht 5\' 4"  (1.626 m)   Wt 169 lb 6.4 oz (76.8 kg)   LMP 11/22/2022   SpO2 99%   BMI 29.08 kg/m  Wt Readings from Last 3 Encounters:  12/14/22 169 lb 6.4 oz (76.8 kg)  10/25/22 164 lb 12.8 oz (74.8 kg)  10/14/22 165 lb 2 oz (74.9 kg)    Physical Exam       Patient has been counseled extensively about nutrition and exercise as well as the importance of adherence with medications and regular follow-up. The patient was given clear instructions to go to ER or return to medical center if symptoms don't improve, worsen or new problems develop. The patient verbalized understanding.   Follow-up: Return if symptoms worsen or fail to improve.   Claiborne Rigg, FNP-BC [facility] [place], [place] [phone]   12/14/2022, 2:22 PM

## 2022-12-15 LAB — VITAMIN D 25 HYDROXY (VIT D DEFICIENCY, FRACTURES): Vit D, 25-Hydroxy: 25.4 ng/mL — ABNORMAL LOW (ref 30.0–100.0)

## 2022-12-16 ENCOUNTER — Other Ambulatory Visit: Payer: Self-pay

## 2023-03-08 ENCOUNTER — Other Ambulatory Visit: Payer: Self-pay

## 2023-03-30 ENCOUNTER — Emergency Department (HOSPITAL_COMMUNITY)
Admission: EM | Admit: 2023-03-30 | Discharge: 2023-03-30 | Disposition: A | Discharge status: Home / Self Care | Payer: Self-pay | Attending: Emergency Medicine | Admitting: Emergency Medicine

## 2023-03-30 ENCOUNTER — Other Ambulatory Visit: Payer: Self-pay

## 2023-03-30 DIAGNOSIS — M5481 Occipital neuralgia: Secondary | ICD-10-CM | POA: Insufficient documentation

## 2023-03-30 LAB — CBC WITH DIFFERENTIAL/PLATELET
Abs Immature Granulocytes: 0.03 10*3/uL (ref 0.00–0.07)
Basophils Absolute: 0 10*3/uL (ref 0.0–0.1)
Basophils Relative: 0 %
Eosinophils Absolute: 0.1 10*3/uL (ref 0.0–0.5)
Eosinophils Relative: 1 %
HCT: 38.3 % (ref 36.0–46.0)
Hemoglobin: 12.4 g/dL (ref 12.0–15.0)
Immature Granulocytes: 0 %
Lymphocytes Relative: 23 %
Lymphs Abs: 1.9 10*3/uL (ref 0.7–4.0)
MCH: 27 pg (ref 26.0–34.0)
MCHC: 32.4 g/dL (ref 30.0–36.0)
MCV: 83.3 fL (ref 80.0–100.0)
Monocytes Absolute: 0.6 10*3/uL (ref 0.1–1.0)
Monocytes Relative: 7 %
Neutro Abs: 5.7 10*3/uL (ref 1.7–7.7)
Neutrophils Relative %: 69 %
Platelets: 354 10*3/uL (ref 150–400)
RBC: 4.6 MIL/uL (ref 3.87–5.11)
RDW: 13 % (ref 11.5–15.5)
WBC: 8.3 10*3/uL (ref 4.0–10.5)
nRBC: 0 % (ref 0.0–0.2)

## 2023-03-30 LAB — COMPREHENSIVE METABOLIC PANEL
ALT: 14 U/L (ref 0–44)
AST: 15 U/L (ref 15–41)
Albumin: 4 g/dL (ref 3.5–5.0)
Alkaline Phosphatase: 47 U/L (ref 38–126)
Anion gap: 9 (ref 5–15)
BUN: 10 mg/dL (ref 6–20)
CO2: 22 mmol/L (ref 22–32)
Calcium: 8.9 mg/dL (ref 8.9–10.3)
Chloride: 103 mmol/L (ref 98–111)
Creatinine, Ser: 0.62 mg/dL (ref 0.44–1.00)
GFR, Estimated: >60 mL/min (ref >60)
Glucose, Bld: 110 mg/dL — ABNORMAL HIGH (ref 70–99)
Potassium: 4 mmol/L (ref 3.5–5.1)
Sodium: 134 mmol/L — ABNORMAL LOW (ref 135–145)
Total Bilirubin: 0.5 mg/dL (ref 0.0–1.2)
Total Protein: 7.1 g/dL (ref 6.5–8.1)

## 2023-03-30 LAB — RESP PANEL BY RT-PCR (RSV, FLU A&B, COVID)  RVPGX2
Influenza A by PCR: NEGATIVE
Influenza B by PCR: NEGATIVE
Resp Syncytial Virus by PCR: NEGATIVE
SARS Coronavirus 2 by RT PCR: NEGATIVE

## 2023-03-30 LAB — HCG, SERUM, QUALITATIVE: Preg, Serum: NEGATIVE

## 2023-03-30 MED ORDER — MELOXICAM 7.5 MG PO TABS
7.5000 mg | ORAL_TABLET | Freq: Every day | ORAL | 0 refills | Status: DC
  Filled 2023-03-30: qty 10, 10d supply, fill #0

## 2023-03-30 MED ORDER — BUPIVACAINE-EPINEPHRINE (PF) 0.25% -1:200000 IJ SOLN
10.0000 mL | Freq: Once | INTRAMUSCULAR | Status: AC
  Administered 2023-03-30: 10 mL
  Filled 2023-03-30: qty 30

## 2023-03-30 MED ORDER — KETOROLAC TROMETHAMINE 15 MG/ML IJ SOLN
15.0000 mg | Freq: Once | INTRAMUSCULAR | Status: AC
  Administered 2023-03-30: 15 mg via INTRAMUSCULAR
  Filled 2023-03-30: qty 1

## 2023-03-30 NOTE — ED Provider Notes (Signed)
 [place] EMERGENCY DEPARTMENT AT [facility] Provider Note   CSN: [id number] Arrival date & time: 03/30/23  0539     History  Chief Complaint  Patient presents with   Migraine    Tracie [name] is a 43 y.o. female.  With history of headaches presents to the emergency department for evaluation of left posterior headache starting yesterday.  Patient states that she has had intermittent, fairly short-lived headaches either on the right occipital or left occipital scalp over the past couple of months.  She has tried over-the-counter medications without improvement.  Yesterday she developed pain in the left occipital area that has become persistent.  This prompted ED evaluation today.  No light sensitivity.  Pain is throbbing in nature.  No nausea or vomiting.  No head injuries.  No confusion. Patient denies signs of stroke including: facial droop, slurred speech, aphasia, weakness/numbness in extremities, imbalance/trouble walking.  Spanish video interpreter utilized for history.       Home Medications Prior to Admission medications   Medication Sig Start Date End Date Taking? Authorizing Provider  buPROPion  (WELLBUTRIN  XL) 150 MG 24 hr tablet Take 1 tablet (150 mg total) by mouth daily. For Depression 12/14/22   Theotis Haze ORN, NP  hydrOXYzine  (ATARAX ) 25 MG tablet Take 1/2 to 1 tablet by mouth every 8 hours as needed for anxiety 12/14/22   Fleming, Zelda W, NP  methocarbamol  (ROBAXIN ) 500 MG tablet Take 2 tablets (1,000 mg total) by mouth every 8 (eight) hours as needed for muscle spasms. 12/14/22   Fleming, Zelda W, NP      Allergies    Patient has no known allergies.    Review of Systems   Review of Systems  Physical Exam Updated Vital Signs BP (!) 146/86 (BP Location: Right Arm)   Pulse (!) 126   Temp 98.3 F (36.8 C)   Resp 18   SpO2 100%   Physical Exam Vitals and nursing note reviewed.  Constitutional:      Appearance: She is well-developed.   HENT:     Head: Normocephalic and atraumatic.     Right Ear: Tympanic membrane, ear canal and external ear normal.     Left Ear: Tympanic membrane, ear canal and external ear normal.     Nose: Nose normal.     Mouth/Throat:     Pharynx: Uvula midline.  Eyes:     General: Lids are normal.     Extraocular Movements:     Right eye: No nystagmus.     Left eye: No nystagmus.     Conjunctiva/sclera: Conjunctivae normal.     Pupils: Pupils are equal, round, and reactive to light.  Cardiovascular:     Rate and Rhythm: Normal rate and regular rhythm.  Pulmonary:     Effort: Pulmonary effort is normal.     Breath sounds: Normal breath sounds.  Abdominal:     Palpations: Abdomen is soft.     Tenderness: There is no abdominal tenderness.  Musculoskeletal:     Cervical back: Normal range of motion and neck supple. No tenderness or bony tenderness.  Skin:    General: Skin is warm and dry.  Neurological:     Mental Status: She is alert and oriented to person, place, and time.     GCS: GCS eye subscore is 4. GCS verbal subscore is 5. GCS motor subscore is 6.     Cranial Nerves: No cranial nerve deficit.     Sensory: No sensory deficit.  Motor: No weakness.     Coordination: Coordination normal.     Gait: Gait normal.     Comments: Upper extremity myotomes tested bilaterally:  C5 Shoulder abduction 5/5 C6 Elbow flexion/wrist extension 5/5 C7 Elbow extension 5/5 C8 Finger flexion 5/5 T1 Finger abduction 5/5  Lower extremity myotomes tested bilaterally: L2 Hip flexion 5/5 L3 Knee extension 5/5 L4 Ankle dorsiflexion 5/5 S1 Ankle plantar flexion 5/5      ED Results / Procedures / Treatments   Labs (all labs ordered are listed, but only abnormal results are displayed) Labs Reviewed  COMPREHENSIVE METABOLIC PANEL - Abnormal; Notable for the following components:      Result Value   Sodium 134 (*)    Glucose, Bld 110 (*)    All other components within normal limits  RESP PANEL  BY RT-PCR (RSV, FLU A&B, COVID)  [id number]  CBC WITH DIFFERENTIAL/PLATELET  HCG, SERUM, QUALITATIVE    EKG None  Radiology No results found.  Procedures .Nerve Block  Date/Time: 03/30/2023 11:54 AM  Performed by: Desiderio Chew, PA-C Authorized by: Desiderio Chew, PA-C   Consent:    Consent obtained:  Verbal   Consent given by:  Patient   Risks discussed:  Infection, bleeding, pain, unsuccessful block and swelling   Alternatives discussed:  Alternative treatment and no treatment Universal protocol:    Patient identity confirmed:  Verbally with patient and provided demographic data Indications:    Indications:  Pain relief Location:    Body area:  Head   Head nerve:  Greater occipital   Laterality:  Right Pre-procedure details:    Skin preparation:  Alcohol Skin anesthesia:    Skin anesthesia method:  Local infiltration Procedure details:    Block needle gauge:  25 G   Anesthetic injected:  Bupivacaine  0.25% w/o epi   Additive injected:  None   Injection procedure:  Anatomic landmarks identified, incremental injection, negative aspiration for blood, anatomic landmarks palpated and introduced needle   Paresthesia:  None Post-procedure details:    Outcome:  Pain unchanged   Procedure completion:  Tolerated well, no immediate complications     Medications Ordered in ED Medications  bupivacaine -epinephrine  (PF) (MARCAINE  W/ EPI) 0.25% -1:200000 injection 10 mL (has no administration in time range)    ED Course/ Medical Decision Making/ A&P    Patient seen and examined. History obtained directly from patient. Work-up including labs, imaging, EKG ordered in triage, if performed, were reviewed.    Labs/EKG: Independently reviewed and interpreted.  This included: CBC unremarkable; CMP unremarkable; pregnancy negative.  Imaging: None ordered  Medications/Fluids: Bupivacaine  0.5% with epinephrine   Most recent vital signs reviewed and are as follows: BP (!) 146/86 (BP  Location: Right Arm)   Pulse (!) 126   Temp 98.3 F (36.8 C)   Resp 18   SpO2 100%   Initial impression: Symptoms are most consistent with occipital neuralgia.  Discussed procedure of occipital nerve block with patient at bedside.  She agrees to proceed.  We discussed risks of pain, bleeding and infection.  If unsuccessful, will consider giving a dose of IM Toradol  in addition to outpatient neurology follow-up.  11:55 AM Reassessment performed. Patient appears stable. Reports unchanged pain after nerve block.  Reviewed pertinent lab work and imaging with patient at bedside. Questions answered.   Most current vital signs reviewed and are as follows: BP 116/73 (BP Location: Right Arm)   Pulse 93   Temp 98.1 F (36.7 C) (Oral)   Resp 17  SpO2 99%   Plan: Discharge to home.   Prescriptions written for: meloxicam   Other home care instructions discussed: Avoidance of triggers, rest, OTC meds  ED return instructions discussed: Patient counseled to return if they have weakness in their arms or legs, slurred speech, trouble walking or talking, confusion, trouble with their balance, or if they have any other concerns. Patient verbalizes understanding and agrees with plan.   Follow-up instructions discussed: Patient encouraged to follow-up with their PCP in 7 days.                                  Medical Decision Making Amount and/or Complexity of Data Reviewed Labs: ordered.  Risk Prescription drug management.   In regards to the patient's headache, critical differentials were considered including subarachnoid hemorrhage, intracerebral hemorrhage, epidural/subdural hematoma, pituitary apoplexy, vertebral/carotid artery dissection, giant cell arteritis, central venous thrombosis, reversible cerebral vasoconstriction, acute angle closure glaucoma, idiopathic intracranial hypertension, bacterial meningitis, viral encephalitis, carbon monoxide poisoning, posterior reversible  encephalopathy syndrome, pre-eclampsia.   Reg flag symptoms related to these causes were considered including systemic symptoms (fever, weight loss), neurologic symptoms (confusion, mental status change, vision change, associated seizure), acute or sudden thunderclap onset, patient age 23 or older with new or progressive headache, patient of any age with first headache or change in headache pattern, pregnant or postpartum status, history of HIV or other immunocompromise, history of cancer, headache occurring with exertion, associated neck or shoulder pain, associated traumatic injury, concurrent use of anticoagulation, family history of spontaneous SAH, and concurrent drug use.    Other benign, more common causes of headache were considered including migraine, tension-type headache, cluster headache, referred pain from other cause such as sinus infection, dental pain, trigeminal neuralgia.   On exam, patient has a reassuring neuro exam including baseline mental status, no significant neck pain or meningeal signs, no signs of severe infection or fever.   Symptoms most consistent with occipital neuralgia given location and tenderness to palpation, however patient without much improvement with attempt at occipital nerve block in ED.  The patient's vital signs, pertinent lab work and imaging were reviewed and interpreted as discussed in the ED course. Hospitalization was considered for further testing, treatments, or serial exams/observation. However as patient is well-appearing, has a stable exam over the course of their evaluation, and reassuring studies today, I do not feel that they warrant admission at this time. This plan was discussed with the patient who verbalizes agreement and comfort with this plan and seems reliable and able to return to the Emergency Department with worsening or changing symptoms.          Final Clinical Impression(s) / ED Diagnoses Final diagnoses:  Occipital  neuralgia of right side    Rx / DC Orders ED Discharge Orders          Ordered    Ambulatory referral to Neurology       Comments: An appointment is requested in approximately: 2 weeks. HA/occipital neuralgia, recurrent   03/30/23 1142              Desiderio Chew, PA-C 03/30/23 1158    Emil Share, DO 03/30/23 1328

## 2023-03-30 NOTE — ED Notes (Signed)
 EDPA at [facility] with Spanish [employer] video interpreter, pt alert, NAD, calm, interactive.

## 2023-03-30 NOTE — Discharge Instructions (Addendum)
 Please read and follow all provided instructions.  Your diagnoses today include:  1. Occipital neuralgia of right side     Tests performed today include: Complete blood cell count: No concerning findings Basic metabolic panel: No concerning findings Vital signs. See below for your results today.   Medications:  Occipital nerve block using bupivacaine   Meloxicam  - anti-inflammatory pain medication  You have been prescribed an anti-inflammatory medication or NSAID. Take with food. Do not take aspirin , ibuprofen , or naproxen  if taking this medication. Take smallest effective dose for the shortest duration needed for your pain. Stop taking if you experience stomach pain or vomiting.   Take any prescribed medications only as directed.  Additional information:  Follow any educational materials contained in this packet.  You are having a headache. No specific cause was found today for your headache. It may have been a migraine or other cause of headache. Stress, anxiety, fatigue, and depression are common triggers for headaches.   Your headache today does not appear to be life-threatening or require hospitalization, but often the exact cause of headaches is not determined in the emergency department. Therefore, follow-up with your doctor is very important to find out what may have caused your headache and whether or not you need any further diagnostic testing or treatment.   Sometimes headaches can appear benign (not harmful), but then more serious symptoms can develop which should prompt an immediate re-evaluation by your doctor or the emergency department.  BE VERY CAREFUL not to take multiple medicines containing Tylenol  (also called acetaminophen ). Doing so can lead to an overdose which can damage your liver and cause liver failure and possibly death.   Follow-up instructions: Please follow-up with your primary care provider in the next 7 days for further evaluation of your symptoms. I  have also placed a referral to neurology for you.   Return instructions:  Please return to the Emergency Department if you experience worsening symptoms. Return if the medications do not resolve your headache, if it recurs, or if you have multiple episodes of vomiting or cannot keep down fluids. Return if you have a change from the usual headache. RETURN IMMEDIATELY IF you: Develop a sudden, severe headache Develop confusion or become poorly responsive or faint Develop a fever above 100.27F or problem breathing Have a change in speech, vision, swallowing, or understanding Develop new weakness, numbness, tingling, incoordination in your arms or legs Have a seizure Please return if you have any other emergent concerns.  Additional Information:  Your vital signs today were: BP 116/73 (BP Location: Right Arm)   Pulse 93   Temp 98.1 F (36.7 C) (Oral)   Resp 17   SpO2 99%  If your blood pressure (BP) was elevated above 135/85 this visit, please have this repeated by your doctor within one month. --------------

## 2023-03-30 NOTE — ED Triage Notes (Signed)
 Patient reports migraine headache onset yesterday , denies photophobia or nausea .

## 2023-03-30 NOTE — ED Notes (Signed)
 Bup-epi requested from pharm, EDPA notified.

## 2023-04-03 ENCOUNTER — Other Ambulatory Visit: Payer: Self-pay

## 2023-04-18 ENCOUNTER — Ambulatory Visit: Payer: Self-pay

## 2023-04-20 ENCOUNTER — Ambulatory Visit: Payer: Self-pay

## 2023-04-24 ENCOUNTER — Ambulatory Visit: Payer: Self-pay

## 2023-05-03 ENCOUNTER — Ambulatory Visit (INDEPENDENT_AMBULATORY_CARE_PROVIDER_SITE_OTHER): Payer: Self-pay | Admitting: Neurology

## 2023-05-03 ENCOUNTER — Encounter: Payer: Self-pay | Admitting: Neurology

## 2023-05-03 ENCOUNTER — Other Ambulatory Visit: Payer: Self-pay

## 2023-05-03 VITALS — BP 138/80 | HR 94 | Ht 60.0 in | Wt 162.0 lb

## 2023-05-03 DIAGNOSIS — G43829 Menstrual migraine, not intractable, without status migrainosus: Secondary | ICD-10-CM

## 2023-05-03 MED ORDER — RIZATRIPTAN BENZOATE 10 MG PO TABS
10.0000 mg | ORAL_TABLET | ORAL | 5 refills | Status: AC | PRN
  Filled 2023-05-03: qty 10, 30d supply, fill #0
  Filled 2023-10-12 – 2023-10-13 (×2): qty 10, 30d supply, fill #1
  Filled 2024-04-24: qty 10, 30d supply, fill #2

## 2023-05-03 MED ORDER — AMITRIPTYLINE HCL 10 MG PO TABS
10.0000 mg | ORAL_TABLET | Freq: Every day | ORAL | 5 refills | Status: DC
Start: 2023-05-03 — End: 2023-07-21
  Filled 2023-05-03: qty 30, 30d supply, fill #0
  Filled 2023-05-25: qty 90, 90d supply, fill #1
  Filled 2023-05-25: qty 30, 30d supply, fill #1

## 2023-05-03 NOTE — Patient Instructions (Addendum)
Start amitriptyline 10mg  every night at bedtime.  If no improvement in 4 weeks (prior to needing refill), contact me and I will increase dose. Take rizatriptan at the earliest onset of the headache.  May repeat after 2 hours if needed.  Maximum 2 tablets in 24 hours. Limit use of pain relievers to no more than 2 days out of week to prevent risk of rebound or medication-overuse headache. Keep a headache diary Follow up in 6 months.     1. Comience con 10 mg de amitriptilina todas las noches antes de acostarse.  Si no mejora en 4 semanas (antes de necesitar una recarga), comunquese conmigo y aumentar la dosis. 2. Tome rizatriptn tan pronto como aparezca el dolor de [place].  Puede repetirse despus de 2 horas si es necesario.  Mximo 2 comprimidos en 24 horas. 3. Limite el uso de analgsicos a no ms de 2 das por semana para Geneticist, molecular de rebote o dolor de cabeza por uso excesivo de medicamentos. 4. Lleve un diario de dolores de cabeza 5. Seguimiento en 6 meses.

## 2023-05-03 NOTE — Progress Notes (Signed)
NEUROLOGY CONSULTATION NOTE  Tracie [name] MRN: [id number] DOB: November 10, 1980  Referring provider: Renne Crigler, PA-C (ED referral) Primary care provider: Johnny Bridge, NP  Reason for consult:  occipital neuralgia  Assessment/Plan:   Menstrual migraine, without status migrainosus, not intractable.  Cervicogenic component may be possible.  Migraine prevention:  Restart amitriptyline 10mg  at bedtime.  She was previously only on l10mg .  If no improvement in 4 weeks, increase to 25mg  at bedtime Migraine rescue:  rizatriptan 10mg  Limit use of pain relievers to no more than 2 days out of week to prevent risk of rebound or medication-overuse headache. Keep headache diary Follow up 6 months.    Subjective:  Tracie [name] is a 43 year old right-handed female who presents for headache.  History supplemented by ED and primary care notes.  She is accompanied by a Spanish-speaking interpreter.    She has had headaches off and on for several years, at least since 2016.  They have become worse since 2022.  She describes a severe "pinching" or "hot" pain in the occipital region (mostly right, sometimes left) that may radiate to the front.  Associated with nausea, brief unsteadiness and eyes feel uncomfortable but denies photophobia, phonophobia, vomiting, or obvious visual symptoms.  In the past, she has had left facial numbness.  Lasts 2 hours.  Occurs around her menstrual period, usually few days before or few days after.  No associated neck pain.  No aggravating factors such as neck movement.  Applying ice to back of head may help.  Prior treatment included:  amitriptyline 10mg  at bedtime, topiramate, Robaxin, Flexeril, sumatriptan, diclofenac, naproxen, meloxicam  Prior workup includes CT head on 05/20/2014, which was personally reviewed and was normal.     PAST MEDICAL HISTORY: Past Medical History:  Diagnosis Date   Anxiety    Depression    Headache     PAST SURGICAL  HISTORY: Past Surgical History:  Procedure Laterality Date   BREAST BIOPSY Right 06/30/2022   [place] RT BREAST BX W LOC DEV EA ADD LESION IMG BX SPEC US GUIDE 06/30/2022 [facility]   BREAST BIOPSY Right 06/30/2022   [place] RT BREAST BX W LOC DEV EA ADD LESION IMG BX SPEC US GUIDE 06/30/2022 [facility]   BREAST BIOPSY Right 06/30/2022   [place] RT BREAST BX W LOC DEV 1ST LESION IMG BX SPEC US GUIDE 06/30/2022 [facility]   BREAST BIOPSY  10/13/2022   [place] RT RADIOACTIVE SEED LOC 10/13/2022 [facility]   BREAST BIOPSY Right 10/13/2022   [place] RT RADIOACTIVE SEED EA ADD LESION 10/13/2022 [facility]   BREAST LUMPECTOMY WITH RADIOACTIVE SEED LOCALIZATION Right 10/14/2022   Procedure: RIGHT BREAST LUMPECTOMY WITH RADIOACTIVE SEED LOCALIZATION X2;  Surgeon: Griselda Miner, MD;  Location:  SURGERY CENTER;  Service: General;  Laterality: Right;    MEDICATIONS: Current Outpatient Medications on File Prior to Visit  Medication Sig Dispense Refill   buPROPion (WELLBUTRIN XL) 150 MG 24 hr tablet Take 1 tablet (150 mg total) by mouth daily. For Depression 90 tablet 1   hydrOXYzine (ATARAX) 25 MG tablet Take 1/2 to 1 tablet by mouth every 8 hours as needed for anxiety 60 tablet 2   meloxicam (MOBIC) 7.5 MG tablet Take 1 tablet (7.5 mg total) by mouth daily. 10 tablet 0   No current facility-administered medications on file prior to visit.    ALLERGIES: No Known Allergies  FAMILY HISTORY: Family History  Problem Relation Age of Onset   Breast cancer Paternal  Aunt    Breast cancer Paternal Aunt    Hypertension Neg Hx    Colon cancer Neg Hx    Esophageal cancer Neg Hx    Pancreatic cancer Neg Hx    Stomach cancer Neg Hx    Liver disease Neg Hx     Objective:  Blood pressure 138/80, pulse 94, height 5' (1.524 m), weight 162 lb (73.5 kg), SpO2 95%. General: No acute distress.  Patient appears well-groomed.   Head:  Normocephalic/atraumatic Eyes:  fundi examined but  not visualized Neck: supple, no paraspinal tenderness, full range of motion Heart: regular rate and rhythm Neurological Exam: Mental status: alert and oriented to person, place, and time, speech fluent and not dysarthric, language intact. Cranial nerves: CN I: not tested CN II: pupils equal, round and reactive to light, visual fields intact CN III, IV, VI:  full range of motion, no nystagmus, no ptosis CN V: facial sensation intact. CN VII: upper and lower face symmetric CN VIII: hearing intact CN IX, X: gag intact, uvula midline CN XI: sternocleidomastoid and trapezius muscles intact CN XII: tongue midline Bulk & Tone: normal, no fasciculations. Motor:  muscle strength 5/5 throughout Sensation:  Pinprick, temperature and vibratory sensation intact. Deep Tendon Reflexes:  2+ throughout,  toes downgoing.   Finger to nose testing:  Without dysmetria.   Heel to shin:  Without dysmetria.   Gait:  Normal station and stride.  Romberg negative.    Thank you for allowing me to take part in the care of this patient.  Shon Millet, DO  CC: Bertram Denver, NP

## 2023-05-25 ENCOUNTER — Other Ambulatory Visit: Payer: Self-pay

## 2023-05-26 ENCOUNTER — Other Ambulatory Visit: Payer: Self-pay

## 2023-05-29 ENCOUNTER — Other Ambulatory Visit: Payer: Self-pay | Admitting: Nurse Practitioner

## 2023-05-29 DIAGNOSIS — F32A Depression, unspecified: Secondary | ICD-10-CM

## 2023-05-30 ENCOUNTER — Other Ambulatory Visit: Payer: Self-pay

## 2023-05-30 MED ORDER — BUPROPION HCL ER (XL) 150 MG PO TB24
150.0000 mg | ORAL_TABLET | Freq: Every day | ORAL | 1 refills | Status: DC
  Filled 2023-05-30: qty 90, 90d supply, fill #0

## 2023-06-08 ENCOUNTER — Other Ambulatory Visit: Payer: Self-pay

## 2023-06-20 ENCOUNTER — Other Ambulatory Visit: Payer: Self-pay

## 2023-06-20 ENCOUNTER — Ambulatory Visit: Payer: Self-pay

## 2023-06-20 ENCOUNTER — Encounter: Payer: Self-pay | Admitting: Internal Medicine

## 2023-06-20 ENCOUNTER — Ambulatory Visit: Payer: Self-pay | Admitting: Internal Medicine

## 2023-06-20 VITALS — BP 110/68 | HR 112 | Temp 98.6°F | Ht 60.0 in | Wt 167.0 lb

## 2023-06-20 DIAGNOSIS — Z8639 Personal history of other endocrine, nutritional and metabolic disease: Secondary | ICD-10-CM

## 2023-06-20 DIAGNOSIS — G5601 Carpal tunnel syndrome, right upper limb: Secondary | ICD-10-CM

## 2023-06-20 MED ORDER — ERGOCALCIFEROL 1.25 MG (50000 UT) PO CAPS
50000.0000 [IU] | ORAL_CAPSULE | ORAL | 0 refills | Status: DC
  Filled 2023-06-20: qty 12, 84d supply, fill #0

## 2023-06-20 MED ORDER — MELOXICAM 15 MG PO TABS
15.0000 mg | ORAL_TABLET | Freq: Every day | ORAL | 0 refills | Status: DC
  Filled 2023-06-20: qty 30, 30d supply, fill #0

## 2023-06-20 NOTE — Patient Instructions (Addendum)
 Recommend obtaining right wrist splint at pharmacy and wearing at night. Prescribed Meloxicam 15 mg daily for 30 days. See Primary Care Provider if not improving in 3-4 weeks.

## 2023-06-20 NOTE — Progress Notes (Addendum)
 Patient Care Team: Claiborne Rigg, NP as PCP - General (Nurse Practitioner) Drema Dallas, DO as Consulting Physician (Neurology)  Visit Date: 06/20/23  Subjective:   Chief Complaint  Patient presents with   Numbness    Hands and feet x 3 months B/L   Patient Tracie [name],Tracie [name] DOB:Oct 13, 1980,43 y.o. GNF:[id number]   [name], here with an interpreter, presents today for acute visit with Right Extremity Numbness. Patient has a past medical history of Migraine - says that she hasn't had any migraines since being started on Elavil 10 mg nightly, but does still have Maxalt 10 mg as needed for onset of migraine. She says that for the past 3-4 months, about 2-3/week her right hand, and in-office today her left leg, will intermittently go numb and last a while; she has noticed this occurring while she is driving and will wake her up at night, which she will relieve by moving her hands or walking around. Says that she has not mentioned this issue to Dr. Everlena Cooper. 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 Non-smoker and does not consume alcohol. Married with two children.   Review of Systems  Neurological:  Positive for tingling (/numbness in right hand & left leg, intermittent, relieved with movement). Negative for weakness.     Objective:  Vitals: BP  110/68   Pulse (!) 112   Temp 98.6 F (37 C) (Temporal)   Ht 5' (1.524 m)   Wt 167 lb (75.8 kg)   SpO2 98%   BMI 32.61 kg/m   Physical Exam Vitals and nursing note reviewed.  Constitutional:      General: She is not in acute distress.    Appearance: Normal appearance. She is not toxic-appearing.  HENT:     Head: Normocephalic and atraumatic.  Neck:     Thyroid: No thyromegaly or thyroid tenderness.  Pulmonary:     Effort: Pulmonary effort is normal.  Musculoskeletal:     Comments: No increased warmth or effusion of right wrist.  Skin:    General: Skin is warm and dry.  Neurological:     Mental Status: She is alert and oriented to person, place, and time. Mental status is at baseline.     Motor: Motor function is intact. No weakness.     Deep Tendon Reflexes:     Reflex Scores:      Patellar reflexes are 2+ on the right side and 2+ on the left side.    Comments: No numbness noted with hands flexed back over wrists during exam   Psychiatric:        Mood and Affect: Mood normal.  Behavior: Behavior normal.        Thought Content: Thought content normal.        Judgment: Judgment normal.    Results:  Studies Obtained And Personally Reviewed By Me: Labs:     Component Value Date/Time   NA 134 (L) 03/30/2023 0606   NA 137 12/06/2022 1705   K 4.0 03/30/2023 0606   CL 103 03/30/2023 0606   CO2 22 03/30/2023 0606   GLUCOSE 110 (H) 03/30/2023 0606   BUN 10 03/30/2023 0606   BUN 12 12/06/2022 1705   CREATININE 0.62 03/30/2023 0606   CREATININE 0.57 02/17/2016 0902   CALCIUM 8.9 03/30/2023 0606   PROT 7.1 03/30/2023 0606   PROT 6.8 12/06/2022 1705   ALBUMIN 4.0 03/30/2023 0606   ALBUMIN 4.2 12/06/2022 1705   AST 15 03/30/2023 0606   ALT 14 03/30/2023 0606   ALKPHOS 47 03/30/2023 0606   BILITOT 0.5 03/30/2023 0606   BILITOT <0.2 12/06/2022 1705   GFRNONAA >60 03/30/2023 0606   GFRNONAA >89 02/17/2016 0902   GFRAA 143 08/16/2019 1526   GFRAA >89 02/17/2016  0902    Lab Results  Component Value Date   WBC 8.3 03/30/2023   HGB 12.4 03/30/2023   HCT 38.3 03/30/2023   MCV 83.3 03/30/2023   PLT 354 03/30/2023   Lab Results  Component Value Date   CHOL 204 (H) 06/13/2017   HDL 42 06/13/2017   LDLCALC 121 (H) 06/13/2017   TRIG 204 (H) 06/13/2017   CHOLHDL 4.9 (H) 06/13/2017   Lab Results  Component Value Date   HGBA1C 5.6 05/26/2021    Lab Results  Component Value Date   TSH 0.988 12/06/2022   Assessment & Plan:  Right Hand Numbness: possible right  Carpal Tunnel Syndrome, has been intermittent for the past 3-4 months, occurs 3-4x week, usually when she is applying pressure with her right hand, but will wake her up at night as well, and is relieved after some time of her moving her hand. Recommended she try wearing a wrist splint (did not have one in-office for right wrist, but showed her what to look for in store) at night and to take Meloxicam 15 mg daily with food for 30 days, which was sent in to her pharmacy. If  symptoms do not improve  in the next 4 weeks, she should follow-up with her Primary Care Provider.  She does domestic work.  History of Vitamin-D Deficiency: refilled Vitamin-D 50,000 units weekly by mouth for 3 months.  I,Tracie [name],acting as a Neurosurgeon for Tracie [name], Tracie [name].,have documented all relevant documentation on the behalf of Tracie [name], Tracie [name],as directed by  Tracie [name], Tracie [name] while in the presence of Tracie [name], Tracie [name].   I, Tracie [name], Tracie [name], have reviewed all documentation for this visit. The documentation on 06/20/23 for the exam, diagnosis, procedures, and orders are all accurate and complete.

## 2023-06-20 NOTE — Telephone Encounter (Signed)
 Chief Complaint: hand and foot numbness Symptoms: hand and foot numbness Frequency: 3-4 months Pertinent Negatives: Patient denies one-sided weakness or paralysis, headache, fever, CP, SOB, unsteady gait, changes in speech, facial droop Disposition: [] ED /[] Urgent Care (no appt availability in office) / [x] Appointment(In office/virtual)/ []  [facility]/ [] Home Care/ [] Refused Recommended Disposition /[] [facility] [facility]/ []  Follow-up with PCP Additional Notes: Pt reports intermittent hand and foot numbness for 3-4 months. Pt states the numbness often alternates between one foot and the other and one hand and the other. Pt also endorses blurry vision intermittently but none today. Hx of transient HTN. Denies headache, denies dizziness, denies CP, denies SOB, denies one-sided weakness/numbness, denies slurred speech, denies facial droop. LMP was 3/6, pt denies pregnancy. RN advised pt she should be seen within 4 hours. Pt agreeable to do that. No availability in the office during that time frame. Pt willing to be seen at a different office. RN scheduled pt for 1400 today at [facility] office as it close to her home. Pt verbalized understanding of the appt. RN advised pt if she develops CP, SOB, one-sided weakness, difficulty with speech or with walking, she needs to call 911. Pt verbalized understanding.     Copied from CRM [id number]. Topic: Clinical - Red Word Triage >> Jun 20, 2023 12:37 PM Ja-Kwan M wrote: Red Word that prompted transfer to Nurse Triage: hands and feet feeling numb Reason for Disposition  [1] Numbness (i.e., loss of sensation) of the face, arm / hand, or leg / foot on one side of the body AND [2] gradual onset (e.g., days to weeks) AND [3] present now  Answer Assessment - Initial Assessment Questions 1. SYMPTOM: "What is the main symptom you are concerned about?" (e.g., weakness, numbness)     Numbness in hands and feet  2. ONSET: "When did this start?" (minutes, hours,  days; while sleeping)     Does not happen daily, but happens often. "More than it should be." Symptoms first presented 3-4 months ago  3. LAST NORMAL: "When was the last time you (the patient) were normal (no symptoms)?"     Symptoms started 3-4 months ago  4. PATTERN "Does this come and go, or has it been constant since it started?"  "Is it present now?"     Comes and goes  5. CARDIAC SYMPTOMS: "Have you had any of the following symptoms: chest pain, difficulty breathing, palpitations?"     None 6. NEUROLOGIC SYMPTOMS: "Have you had any of the following symptoms: headache, dizziness, vision loss, double vision, changes in speech, unsteady on your feet?"     "Sometimes I have blurred vision" - denies headache, dizziness, changes in speech, unsteady gait  7. OTHER SYMPTOMS: "Do you have any other symptoms?"     Endorses 4/10 pain in her hands and feet with numbness. Denies one-sided weakness or paralysis. Sometimes it is just one foot that is numb and the other foot is normal, sometimes it is just one hand that is numb and the other hand is normal. Pt states with her hands it is worse on the R side. No facial droop. Hx of transient HTN. Denies blurred vision today but endorses L foot numbness. 8. PREGNANCY: "Is there any chance you are pregnant?" "When was your last menstrual period?"     Denies pregnancy, LMP was 3/6  Protocols used: Neurologic Deficit-A-AH

## 2023-06-28 ENCOUNTER — Ambulatory Visit: Payer: Self-pay | Admitting: Nurse Practitioner

## 2023-06-30 ENCOUNTER — Ambulatory Visit: Payer: Self-pay

## 2023-07-21 ENCOUNTER — Ambulatory Visit: Payer: Self-pay | Attending: Nurse Practitioner | Admitting: Nurse Practitioner

## 2023-07-21 ENCOUNTER — Encounter: Payer: Self-pay | Admitting: Nurse Practitioner

## 2023-07-21 ENCOUNTER — Telehealth: Payer: Self-pay | Admitting: Neurology

## 2023-07-21 ENCOUNTER — Other Ambulatory Visit: Payer: Self-pay

## 2023-07-21 VITALS — BP 124/83 | HR 88 | Resp 19 | Ht 60.0 in | Wt 165.6 lb

## 2023-07-21 DIAGNOSIS — E559 Vitamin D deficiency, unspecified: Secondary | ICD-10-CM

## 2023-07-21 DIAGNOSIS — G5601 Carpal tunnel syndrome, right upper limb: Secondary | ICD-10-CM

## 2023-07-21 DIAGNOSIS — L989 Disorder of the skin and subcutaneous tissue, unspecified: Secondary | ICD-10-CM

## 2023-07-21 DIAGNOSIS — Z1231 Encounter for screening mammogram for malignant neoplasm of breast: Secondary | ICD-10-CM

## 2023-07-21 DIAGNOSIS — R7309 Other abnormal glucose: Secondary | ICD-10-CM

## 2023-07-21 DIAGNOSIS — F418 Other specified anxiety disorders: Secondary | ICD-10-CM

## 2023-07-21 DIAGNOSIS — F32A Depression, unspecified: Secondary | ICD-10-CM

## 2023-07-21 MED ORDER — AMITRIPTYLINE HCL 10 MG PO TABS
10.0000 mg | ORAL_TABLET | Freq: Every day | ORAL | 3 refills | Status: DC
  Filled 2023-07-21: qty 30, 30d supply, fill #0
  Filled 2023-08-24: qty 90, 90d supply, fill #0
  Filled 2023-08-25: qty 30, 30d supply, fill #0
  Filled 2023-10-12 – 2023-10-13 (×2): qty 30, 30d supply, fill #1

## 2023-07-21 MED ORDER — MUPIROCIN 2 % EX OINT
1.0000 | TOPICAL_OINTMENT | Freq: Two times a day (BID) | CUTANEOUS | 1 refills | Status: DC
  Filled 2023-07-21 – 2023-08-03 (×2): qty 22, 11d supply, fill #0

## 2023-07-21 MED ORDER — BUPROPION HCL ER (XL) 150 MG PO TB24
150.0000 mg | ORAL_TABLET | Freq: Every day | ORAL | 1 refills | Status: DC
Start: 2023-07-21 — End: 2023-12-08
  Filled 2023-07-21 – 2023-09-05 (×2): qty 90, 90d supply, fill #0

## 2023-07-21 NOTE — Telephone Encounter (Signed)
Refills sent into the pharmacy

## 2023-07-21 NOTE — Telephone Encounter (Signed)
 Needs a refill on the amitriptyline  10 mg called in to the community pharamcy at [facility]

## 2023-07-21 NOTE — Progress Notes (Signed)
 Assessment & Plan:  Tracie [name] was seen today for medical management of chronic issues.  Diagnoses and all orders for this visit:  Carpal tunnel syndrome of right wrist -     Ambulatory referral to Hand Surgery  Vitamin D  deficiency disease -     VITAMIN D  25 Hydroxy (Vit-D Deficiency, Fractures)  Anxiety and depression -     buPROPion  (WELLBUTRIN  XL) 150 MG 24 hr tablet; Take 1 tablet (150 mg total) by mouth daily. For Depression  Elevated glucose -     CMP14+EGFR -     Hemoglobin A1c  Scalp lesion -     mupirocin  ointment (BACTROBAN ) 2 %; Apply 1 Application topically 2 (two) times daily. Apply to scalp as instructed  Breast cancer screening by mammogram -     MM 3D DIAGNOSTIC MAMMOGRAM BILATERAL BREAST; Future    Patient has been counseled on age-appropriate routine health concerns for screening and prevention. These are reviewed and up-to-date. Referrals have been placed accordingly. Immunizations are up-to-date or declined.    Subjective:   Chief Complaint  Patient presents with   Medical Management of Chronic Issues    Tracie [name] 43 y.o. female presents to office today with complaints of right hand pain and requesting refill of wellbutrin   VRI was used to communicate directly with patient for the entire encounter including providing detailed patient instructions.     Complete tunnel syndrome  She has been experiencing right hand pain and numbness for the past several months.  Relieving factors include rotating of hands or massaging.  Wrist splint and meloxicam  have both been ineffective.  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    She has been experiencing right ear tinnitus and requested I examined her ear today.  Ear exam is normal    Review of  Systems  Constitutional:  Negative for fever, malaise/fatigue and weight loss.  HENT:  Positive for tinnitus. Negative for nosebleeds.   Eyes: Negative.  Negative for blurred vision, double vision and photophobia.  Respiratory: Negative.  Negative for cough and shortness of breath.   Cardiovascular: Negative.  Negative for chest pain, palpitations and leg swelling.  Gastrointestinal: Negative.  Negative for heartburn, nausea and vomiting.  Musculoskeletal:  Positive for joint pain. Negative for myalgias.  Neurological: Negative.  Negative for dizziness, focal weakness, seizures and headaches.  Psychiatric/Behavioral:  Positive for depression. Negative for suicidal ideas. The patient is nervous/anxious.     Past Medical History:  Diagnosis Date   Anxiety    Depression    Headache     Past Surgical History:  Procedure Laterality Date   BREAST BIOPSY Right 06/30/2022   US  RT BREAST BX W LOC DEV EA ADD LESION IMG BX SPEC US  GUIDE 06/30/2022 [facility]   BREAST BIOPSY Right 06/30/2022   US  RT BREAST BX W LOC DEV EA ADD LESION IMG BX SPEC US  GUIDE 06/30/2022 [facility]   BREAST BIOPSY Right 06/30/2022   US  RT BREAST BX W LOC DEV 1ST LESION IMG BX SPEC US  GUIDE 06/30/2022 [facility]   BREAST BIOPSY  10/13/2022   US  RT RADIOACTIVE SEED LOC 10/13/2022 [facility]   BREAST BIOPSY Right 10/13/2022   US  RT RADIOACTIVE SEED EA ADD LESION 10/13/2022 [facility]   BREAST  LUMPECTOMY WITH RADIOACTIVE SEED LOCALIZATION Right 10/14/2022   Procedure: RIGHT BREAST LUMPECTOMY WITH RADIOACTIVE SEED LOCALIZATION X2;  Surgeon: Tracie Chandler, MD;  Location: [facility];  Service: General;  Laterality: Right;    Family History  Problem Relation Age of Onset   Breast cancer Paternal Aunt    Breast cancer Paternal Aunt    Hypertension Neg Hx    Colon cancer Neg Hx    Esophageal cancer Neg Hx    Pancreatic cancer Neg Hx    Stomach cancer Neg Hx    Liver  disease Neg Hx     Social History Reviewed with no changes to be made today.   Outpatient Medications Prior to Visit  Medication Sig Dispense Refill   amitriptyline  (ELAVIL ) 10 MG tablet Take 1 tablet (10 mg total) by mouth at bedtime. 30 tablet 5   hydrOXYzine  (ATARAX ) 25 MG tablet Take 1/2 to 1 tablet by mouth every 8 hours as needed for anxiety 60 tablet 2   rizatriptan  (MAXALT ) 10 MG tablet Take 1 tablet (10 mg total) by mouth as needed for migraine. May repeat in 2 hours if needed.  Maximum 2 tablets in 24 hours. 10 tablet 5   buPROPion  (WELLBUTRIN  XL) 150 MG 24 hr tablet Take 1 tablet (150 mg total) by mouth daily. For Depression 90 tablet 1   ergocalciferol  (VITAMIN D2) 1.25 MG (50000 UT) capsule Take 1 capsule (50,000 Units total) by mouth once a week. (Patient not taking: Reported on 07/21/2023) 12 capsule 0   meloxicam  (MOBIC ) 15 MG tablet Take 1 tablet (15 mg total) by mouth daily. (Patient not taking: Reported on 07/21/2023) 30 tablet 0   No facility-administered medications prior to visit.    No Known Allergies     Objective:    BP 124/83 (BP Location: Left Arm, Patient Position: Sitting, Cuff Size: Normal)   Pulse 88   Resp 19   Ht 5' (1.524 m)   Wt 165 lb 9.6 oz (75.1 kg)   LMP 06/25/2023 (Exact Date)   SpO2 100%   BMI 32.34 kg/m  Wt Readings from Last 3 Encounters:  07/21/23 165 lb 9.6 oz (75.1 kg)  06/20/23 167 lb (75.8 kg)  05/03/23 162 lb (73.5 kg)    Physical Exam Vitals and nursing note reviewed.  Constitutional:      Appearance: She is well-developed.  HENT:     Head: Normocephalic and atraumatic.  Cardiovascular:     Rate and Rhythm: Normal rate and regular rhythm.     Heart sounds: Normal heart sounds. No murmur heard.    No friction rub. No gallop.  Pulmonary:     Effort: Pulmonary effort is normal. No tachypnea or respiratory distress.     Breath sounds: Normal breath sounds. No decreased breath sounds, wheezing, rhonchi or rales.  Chest:      Chest wall: No tenderness.  Abdominal:     General: Bowel sounds are normal.     Palpations: Abdomen is soft.  Musculoskeletal:        General: Normal range of motion.     Cervical back: Normal range of motion.  Skin:    General: Skin is warm and dry.  Neurological:     Mental Status: She is alert and oriented to person, place, and time.     Coordination: Coordination normal.  Psychiatric:        Behavior: Behavior normal. Behavior is cooperative.        Thought Content: Thought content  normal.        Judgment: Judgment normal.          Patient has been counseled extensively about nutrition and exercise as well as the importance of adherence with medications and regular follow-up. The patient was given clear instructions to go to ER or return to medical center if symptoms don't improve, worsen or new problems develop. The patient verbalized understanding.   Follow-up: Return if symptoms worsen or fail to improve.   Collins Dean, FNP-BC [facility] and [facility] [place], [place] [phone]   07/21/2023, 10:42 AM

## 2023-07-22 ENCOUNTER — Encounter: Payer: Self-pay | Admitting: Nurse Practitioner

## 2023-07-22 LAB — HEMOGLOBIN A1C
Est. average glucose Bld gHb Est-mCnc: 111 mg/dL
Hgb A1c MFr Bld: 5.5 % (ref 4.8–5.6)

## 2023-07-22 LAB — VITAMIN D 25 HYDROXY (VIT D DEFICIENCY, FRACTURES): Vit D, 25-Hydroxy: 54 ng/mL (ref 30.0–100.0)

## 2023-07-22 LAB — CMP14+EGFR
ALT: 14 IU/L (ref 0–32)
AST: 13 IU/L (ref 0–40)
Albumin: 4.4 g/dL (ref 3.9–4.9)
Alkaline Phosphatase: 58 IU/L (ref 44–121)
BUN/Creatinine Ratio: 18 (ref 9–23)
BUN: 12 mg/dL (ref 6–24)
Bilirubin Total: 0.2 mg/dL (ref 0.0–1.2)
CO2: 20 mmol/L (ref 20–29)
Calcium: 9 mg/dL (ref 8.7–10.2)
Chloride: 105 mmol/L (ref 96–106)
Creatinine, Ser: 0.65 mg/dL (ref 0.57–1.00)
Globulin, Total: 2.2 g/dL (ref 1.5–4.5)
Glucose: 84 mg/dL (ref 70–99)
Potassium: 4.5 mmol/L (ref 3.5–5.2)
Sodium: 140 mmol/L (ref 134–144)
Total Protein: 6.6 g/dL (ref 6.0–8.5)
eGFR: 113 mL/min/{1.73_m2} (ref >59)

## 2023-07-27 ENCOUNTER — Other Ambulatory Visit: Payer: Self-pay

## 2023-07-27 DIAGNOSIS — Z9889 Other specified postprocedural states: Secondary | ICD-10-CM

## 2023-08-01 ENCOUNTER — Other Ambulatory Visit: Payer: Self-pay

## 2023-08-03 ENCOUNTER — Other Ambulatory Visit: Payer: Self-pay

## 2023-08-24 ENCOUNTER — Other Ambulatory Visit: Payer: Self-pay

## 2023-08-25 ENCOUNTER — Other Ambulatory Visit: Payer: Self-pay

## 2023-09-06 ENCOUNTER — Other Ambulatory Visit: Payer: Self-pay

## 2023-10-13 ENCOUNTER — Other Ambulatory Visit: Payer: Self-pay

## 2023-10-16 ENCOUNTER — Ambulatory Visit: Payer: Self-pay

## 2023-10-16 NOTE — Telephone Encounter (Addendum)
 [employer] Interpreter (Spanish): Tracie [name] #[id number]  FYI Only or Action Required?: FYI only for provider.  Patient was last seen in primary care on 07/21/2023 by Theotis Haze ORN, NP.  Called Nurse Triage reporting Eye Problem.  Symptoms began several weeks ago.  Interventions attempted: Nothing.  Symptoms are: gradually worsening.  Triage Disposition: See HCP Within 4 Hours (Or PCP Triage)  Patient/caregiver understands and will follow disposition?: Yes Reason for Disposition  Cloudy spot or sore seen on the cornea (clear part of the eye)  Answer Assessment - Initial Assessment Questions Patient denies wearing contacts or glasses. Denies higher acuity questions. Due to no soon in office appts, patient agreed to go to [facility].  1. LOCATION: Where is the redness? (e.g., eyeball or outer eyelids) Note: When callers say the eye is red, they usually mean the sclera (white of the eye) is red.       R sided red sclera, left side no redness 2. ONE OR BOTH EYES: Is the redness in one or both eyes?      Intermittently cloudy L eye, Redness R eye 3. ONSET: When did the redness start? (e.g., hours, days)      2-3 weeks 4. EYELIDS: Are the eyelids red or swollen? If Yes, ask: How much?      Denies 5. VISION: Do you have blurred vision?     L side, clears when eyes are moved 6. ITCHING: Does it feel itchy? If so ask: How bad is it (Scale 1-10; or mild, moderate, severe)     Yes 7. PAIN: Is it painful? If Yes, ask: How bad is the pain? (Scale 0-10; or none, mild, moderate, severe)     Denies 8. CONTACT LENS: Do you wear contacts?     Denies 10. OTHER SYMPTOMS: Do you have any other symptoms? (e.g., fever, runny nose, cough, vomiting)       Denies  Protocols used: Eye - Redness-A-AH Copied from CRM [id number]. Topic: Clinical - Red Word Triage >> Oct 16, 2023 11:46 AM [name] wrote: Red Word that prompted transfer to Nurse Triage: pt is  having some trouble w/her vision. Pt is seeing little clouds and have large red veins

## 2023-10-17 NOTE — Telephone Encounter (Signed)
 Noted

## 2023-10-19 NOTE — Progress Notes (Unsigned)
 NEUROLOGY FOLLOW UP OFFICE NOTE  Tracie [name] [id number]  Assessment/Plan:   Menstrual migraine, without status migrainosus, not intractable.  Cervicogenic component may be possible.  Migraine prevention:  Increase amitriptyline  to 25mg  at bedtime.  We can increase to 50mg  at bedtime in 6 weeks if needed. Migraine rescue:  rizatriptan  10mg   Limit use of pain relievers to no more than 9 days out of the month to prevent risk of rebound or medication-overuse headache. Keep headache diary Follow up 8 months.    Subjective:  Tracie [name] is a 43 year old right-handed female who follows up for headache.  She is accompanied by a Spanish-speaking interpreter.    UPDATE: Restarted amitriptyline . Initially helping.  Increased during last month Intensity:  severe Duration:  30 minutes with rizatriptan  or ibuprofen  but sometimes needs to repeat dose Frequency:  initially once every 2 weeks; over past month, they are occurring 2 to 3 times a week.   Frequency of analgesics:  over 10 days a month  Current NSAID/analgesic:  ibuprofen  Current triptan: rizatriptan  10mg  Current muscle relaxant:  methocarbamol  500mg  QID Current antidepressant:  amitriptyline  10mg  at bedtime, bupropion  XL 150mg  daily  HISTORY: She has had headaches off and on for several years, at least since 2016.  They have become worse since 2022.  She describes a severe pinching or hot pain in the occipital region (mostly right, sometimes left) that may radiate to the front.  Associated with nausea, brief unsteadiness and eyes feel uncomfortable but denies photophobia, phonophobia, vomiting, or obvious visual symptoms.  In the past, she has had left facial numbness.  Lasts 2 hours.  Occurs around her menstrual period, usually few days before or few days after.  No associated neck pain.  No aggravating factors such as neck movement.  Applying ice to back of head may help.  Prior treatment included:   topiramate , Flexeril , sumatriptan , diclofenac , naproxen , meloxicam   Prior workup includes CT head on 05/20/2014, which was personally reviewed and was normal.    PAST MEDICAL HISTORY: Past Medical History:  Diagnosis Date   Anxiety    Depression    Headache     MEDICATIONS: Current Outpatient Medications on File Prior to Visit  Medication Sig Dispense Refill   amitriptyline  (ELAVIL ) 10 MG tablet Take 1 tablet (10 mg total) by mouth at bedtime. 30 tablet 3   buPROPion  (WELLBUTRIN  XL) 150 MG 24 hr tablet Take 1 tablet (150 mg total) by mouth daily. For Depression 90 tablet 1   ergocalciferol  (VITAMIN D2) 1.25 MG (50000 UT) capsule Take 1 capsule (50,000 Units total) by mouth once a week. (Patient not taking: Reported on 07/21/2023) 12 capsule 0   hydrOXYzine  (ATARAX ) 25 MG tablet Take 1/2 to 1 tablet by mouth every 8 hours as needed for anxiety 60 tablet 2   mupirocin  ointment (BACTROBAN ) 2 % Apply 1 Application topically 2 (two) times daily. Apply to scalp as instructed 22 g 1   rizatriptan  (MAXALT ) 10 MG tablet Take 1 tablet (10 mg total) by mouth as needed for migraine. May repeat in 2 hours if needed.  Maximum 2 tablets in 24 hours. 10 tablet 5   No current facility-administered medications on file prior to visit.    ALLERGIES: No Known Allergies  FAMILY HISTORY: Family History  Problem Relation Age of Onset   Breast cancer Paternal Aunt    Breast cancer Paternal Aunt    Hypertension Neg Hx    Colon cancer Neg Hx    Esophageal cancer  Neg Hx    Pancreatic cancer Neg Hx    Stomach cancer Neg Hx    Liver disease Neg Hx       Objective:  Blood pressure 123/76, pulse (!) 101, resp. rate 20, weight 167 lb (75.8 kg), SpO2 100%. General: No acute distress.  Patient appears well-groomed.     Juliene Dunnings, DO  CC: [name], NP

## 2023-10-20 ENCOUNTER — Ambulatory Visit: Payer: Self-pay | Admitting: Neurology

## 2023-10-20 ENCOUNTER — Encounter: Payer: Self-pay | Admitting: Neurology

## 2023-10-20 ENCOUNTER — Other Ambulatory Visit: Payer: Self-pay

## 2023-10-20 VITALS — BP 123/76 | HR 101 | Resp 20 | Wt 167.0 lb

## 2023-10-20 DIAGNOSIS — Z9889 Other specified postprocedural states: Secondary | ICD-10-CM

## 2023-10-20 DIAGNOSIS — G43829 Menstrual migraine, not intractable, without status migrainosus: Secondary | ICD-10-CM

## 2023-10-20 MED ORDER — AMITRIPTYLINE HCL 25 MG PO TABS
25.0000 mg | ORAL_TABLET | Freq: Every day | ORAL | 5 refills | Status: AC
  Filled 2023-10-20: qty 30, 30d supply, fill #0
  Filled 2023-11-22: qty 90, 90d supply, fill #1
  Filled 2024-04-24: qty 90, 90d supply, fill #2

## 2023-10-20 NOTE — Patient Instructions (Addendum)
 Increase amitriptyline  to 25mg  at bedtime. If headaches are not improved in 6 weeks, contact me Take rizatriptan  at earliest onset of headache.  May repeat after 2 hours.  Maximum 2 tablets in 24 hours.   Limit use of pain relievers to no more than 9 days out of the month to prevent risk of rebound or medication-overuse headache.  This includes rizatriptan , ibuprofen , Tylenol , Advil , Aleve , Excedrin Keep headache diary Follow up in 8 months.   1. Aumente la dosis de amitriptilina a 25 mg antes de acostarse. Si el dolor de cabeza no mejora en 6 semanas, contcteme. 2. Tome rizatriptn al inicio del dolor de [place]. Puede repetirlo despus de 2 horas. Mximo 2 comprimidos en 24 horas. 3. Limite el uso de analgsicos a no ms de [address] al mes para prevenir el riesgo de cefalea de rebote o por abuso de medicamentos. Esto incluye rizatriptn, ibuprofeno, Tylenol , Advil , Aleve  y Excedrin. 4. Lleve un diario de sus dolores de [place]. 5. Haga un seguimiento en 8 meses.

## 2023-10-26 ENCOUNTER — Other Ambulatory Visit: Payer: Self-pay | Admitting: Obstetrics and Gynecology

## 2023-10-26 ENCOUNTER — Ambulatory Visit
Admission: RE | Admit: 2023-10-26 | Discharge: 2023-10-26 | Disposition: A | Discharge status: Home / Self Care | Source: Ambulatory Visit | Attending: Obstetrics and Gynecology | Admitting: Obstetrics and Gynecology

## 2023-10-26 ENCOUNTER — Other Ambulatory Visit: Payer: Self-pay

## 2023-10-26 ENCOUNTER — Ambulatory Visit
Admission: RE | Admit: 2023-10-26 | Discharge: 2023-10-26 | Disposition: A | Discharge status: Home / Self Care | Payer: Self-pay | Source: Ambulatory Visit | Attending: Nurse Practitioner | Admitting: Nurse Practitioner

## 2023-10-26 ENCOUNTER — Ambulatory Visit: Payer: Self-pay | Admitting: *Deleted

## 2023-10-26 VITALS — Wt 167.0 lb

## 2023-10-26 DIAGNOSIS — N6325 Unspecified lump in the left breast, overlapping quadrants: Secondary | ICD-10-CM

## 2023-10-26 DIAGNOSIS — N6320 Unspecified lump in the left breast, unspecified quadrant: Secondary | ICD-10-CM

## 2023-10-26 DIAGNOSIS — Z9889 Other specified postprocedural states: Secondary | ICD-10-CM

## 2023-10-26 DIAGNOSIS — N6321 Unspecified lump in the left breast, upper outer quadrant: Secondary | ICD-10-CM

## 2023-10-26 DIAGNOSIS — Z1231 Encounter for screening mammogram for malignant neoplasm of breast: Secondary | ICD-10-CM

## 2023-10-26 DIAGNOSIS — Z1239 Encounter for other screening for malignant neoplasm of breast: Secondary | ICD-10-CM

## 2023-10-26 DIAGNOSIS — Z803 Family history of malignant neoplasm of breast: Secondary | ICD-10-CM

## 2023-10-26 NOTE — Progress Notes (Signed)
 [name] is a 42 y.o. female who presents to [facility] clinic today with no complaints.    Pap Smear: Pap smear not completed today. Last Pap smear was 05/26/2021 at  [facility] and was normal with negative HPV. Per patient has no history of an abnormal Pap smear. Last Pap smear result is available in Epic.   Physical exam: Breasts Left breast greater than right breast that per patient is normal for her. No skin abnormalities bilateral breasts. No nipple retraction bilateral breasts. No nipple discharge bilateral breasts. No lymphadenopathy. No lumps palpated right breast. Palpated a lump within the left breast at 3 o'clock 6 cm from the nipple. No complaints of pain or tenderness on exam.   MM Breast Surgical Specimen Result Date: 10/14/2022 CLINICAL DATA:  43 year old with biopsy-proven FEA involving 2 sites in the UPPER OUTER QUADRANT of the RIGHT breast associated with a wing clip and a heart clip. EXAM: SPECIMEN RADIOGRAPH OF THE RIGHT BREAST x 2 COMPARISON:  Previous exam(s). FINDINGS: Status post excisional biopsy of the RIGHT breast x 2. The radioactive seed and the wing shaped tissue marking clip are present in the initial non-compressed specimen. The seed appears intact. The radioactive seed and the heart shaped tissue marking clip are present in the second non-compressed specimen. The seed appears intact. These results were discussed with the operating room nurse by telephone at the time of interpretation. IMPRESSION: Specimen radiograph of the RIGHT breast x 2. Electronically Signed   By: Debby Satterfield M.D.   On: 10/14/2022 09:40   MM Breast Surgical Specimen Result Date: 10/14/2022 CLINICAL DATA:  43 year old with biopsy-proven FEA involving 2 sites in the UPPER OUTER QUADRANT of the RIGHT breast associated with a wing clip and a heart clip. EXAM: SPECIMEN RADIOGRAPH OF THE RIGHT BREAST x 2 COMPARISON:  Previous exam(s). FINDINGS: Status  post excisional biopsy of the RIGHT breast x 2. The radioactive seed and the wing shaped tissue marking clip are present in the initial non-compressed specimen. The seed appears intact. The radioactive seed and the heart shaped tissue marking clip are present in the second non-compressed specimen. The seed appears intact. These results were discussed with the operating room nurse by telephone at the time of interpretation. IMPRESSION: Specimen radiograph of the RIGHT breast x 2. Electronically Signed   By: Debby Satterfield M.D.   On: 10/14/2022 09:40   MM CLIP PLACEMENT RIGHT Result Date: 10/13/2022 CLINICAL DATA:  Post radioactive seed placement in 2 masses in the right breast at the 10 o'clock (wing clip) and 11 o'clock (heart clip) positions, at site of flat epithelial atypia. EXAM: DIAGNOSTIC RIGHT MAMMOGRAM POST ULTRASOUND-GUIDED RADIOACTIVE SEED PLACEMENT COMPARISON:  Previous exam(s). FINDINGS: Mammographic images were obtained following ultrasound-guided radioactive seed placement. These demonstrate a radioactive seed in the mass in the right breast at the 10 o'clock position, adjacent to wing shaped biopsy marking clip as well as a radioactive seed in the mass in the right breast at the 11 o'clock position, adjacent to the heart shaped biopsy marking. IMPRESSION: Appropriate location of the TWO radioactive seeds. Final Assessment: Post Procedure Mammograms for Seed Placement Electronically Signed   By: Delon Music M.D.   On: 10/13/2022 13:49   MM CLIP PLACEMENT RIGHT Result Date: 06/30/2022 CLINICAL DATA:  Post procedure mammogram for clip placement EXAM: 3D DIAGNOSTIC RIGHT MAMMOGRAM POST ULTRASOUND BIOPSY COMPARISON:  Previous exam(s). FINDINGS: 3D Mammographic images were obtained following ultrasound guided biopsy of a mass in  the right breast at 9 o'clock. The coil biopsy marking clip is in expected position at the site of biopsy. 3D Mammographic images were obtained following ultrasound  guided biopsy of a mass in the right breast at 10 o'clock. The wing biopsy marking clip is in expected position at the site of biopsy. 3D Mammographic images were obtained following ultrasound guided biopsy of a mass in the right breast at 11 o'clock. The heart biopsy marking clip is in expected position at the site of biopsy. IMPRESSION: Appropriate positioning of the coil shaped biopsy marking clip at the site of biopsy in the right breast at 9 o'clock. Appropriate positioning of the wing shaped biopsy marking clip at the site of biopsy in the right breast at 10 o'clock. Appropriate positioning of the heart shaped biopsy marking clip at the site of biopsy in the right breast at 11 o'clock. Final Assessment: Post Procedure Mammograms for Marker Placement Electronically Signed   By: Inocente Ast M.D.   On: 06/30/2022 14:06  MM 3D DIAGNOSTIC MAMMOGRAM UNILATERAL RIGHT BREAST Result Date: 06/23/2022 CLINICAL DATA:  Patient returns after BASELINE screening for evaluation of possible RIGHT breast mass. EXAM: DIGITAL DIAGNOSTIC UNILATERAL RIGHT MAMMOGRAM WITH TOMOSYNTHESIS; ULTRASOUND RIGHT BREAST LIMITED TECHNIQUE: Right digital diagnostic mammography and breast tomosynthesis was performed.; Targeted ultrasound examination of the right breast was performed COMPARISON:  Previous exam(s). ACR Breast Density Category b: There are scattered areas of fibroglandular density. FINDINGS: Additional 2-D and 3-D images are performed. These views confirm presence of 2 partially obscured oval masses in the LATERAL portion of the RIGHT breast. Targeted ultrasound is performed, showing an oval hypoechoic parallel mass with posterior acoustic enhancement and indistinct margins in the 10 o'clock location of the RIGHT breast 4 centimeters from the nipple. Mass is 0.9 x 0.7 x 1.0 centimeters. There is no internal blood flow by Doppler evaluation. In the 11 o'clock location 3 centimeters from nipple, an oval mass with irregular  margins is 0.8 x 0.6 x 0.4 centimeters. There is no internal blood flow on Doppler evaluation. In the 9 o'clock location 4 centimeters from nipple, an oval mass with angular margins is 0.7 x 0.7 x 0.4 centimeters. There is no internal blood flow on Doppler evaluation. Evaluation of the RIGHT axilla is negative for adenopathy. IMPRESSION: 1. 3 indeterminate masses in the LATERAL portion of the RIGHT breast warranting tissue diagnosis. 2. No RIGHT axillary adenopathy. RECOMMENDATION: Ultrasound-guided core biopsy x3 of masses in the 9 o'clock, 10 o'clock, and 11 o'clock locations of the RIGHT breast. I have discussed the findings and recommendations with the patient with the assistance of an online interpreter. If applicable, a reminder letter will be sent to the patient regarding the next appointment. BI-RADS CATEGORY  4: Suspicious. Electronically Signed   By: Almarie Daring M.D.   On: 06/23/2022 15:21  MS DIGITAL SCREENING TOMO BILATERAL Result Date: 06/13/2022 CLINICAL DATA:  Screening. EXAM: DIGITAL SCREENING BILATERAL MAMMOGRAM WITH TOMOSYNTHESIS AND CAD TECHNIQUE: Bilateral screening digital craniocaudal and mediolateral oblique mammograms were obtained. Bilateral screening digital breast tomosynthesis was performed. The images were evaluated with computer-aided detection. COMPARISON:  None available. ACR Breast Density Category b: There are scattered areas of fibroglandular density. FINDINGS: In the right breast, a possible mass warrants further evaluation. In the left breast, no findings suspicious for malignancy. IMPRESSION: Further evaluation is suggested for a possible mass in the right breast. RECOMMENDATION: Diagnostic mammogram and possibly ultrasound of the right breast. (Code:[id number]) The patient will be contacted regarding the findings,  and additional imaging will be scheduled. BI-RADS CATEGORY  0: Incomplete: Need additional imaging evaluation. Electronically Signed   By: Serena  Chacko M.D.    On: 06/13/2022 10:38   Pelvic/Bimanual Pap is not indicated today per BCCCP guidelines.   Smoking History: Patient has never smoked.  Patient Navigation: Patient education provided. Access to services provided for patient through [place] program. Spanish interpreter Bernice Angry from [facility] provided.   Breast and Cervical Cancer Risk Assessment: Patient has family history of three paternal aunts having breast cancer. Patient has no known genetic mutations or history of radiation treatment to the chest before age 55. Patient does not have history of cervical dysplasia, immunocompromised, or DES exposure in-utero.  Risk Scores as of Encounter on 10/26/2023     Alisa           5-year 0.43%   Lifetime 8.36%            Last calculated by Silas, Ansyi K, CMA on 10/26/2023 at  9:21 AM        A: BCCCP exam without pap smear No complaints.  P: Referred patient to the [facility] for a diagnostic mammogram. Appointment scheduled Thursday, October 26, 2023 at 1030.  Driscilla Wanda SQUIBB, RN 10/26/2023 9:30 AM

## 2023-10-26 NOTE — Patient Instructions (Signed)
 Explained breast self awareness with [name]. Patient did not need a Pap smear today due to last Pap smear and HPV typing was 05/26/2021. Let her know BCCCP will cover Pap smears and HPV typing every 5 years unless has a history of abnormal Pap smears. Referred patient to the [facility] for a diagnostic mammogram. Appointment scheduled Thursday, October 26, 2023 at 1030. Patient aware of appointment and will be there. Tracie [name] verbalized understanding.  Ysabel Cowgill, Wanda Ship, RN 9:30 AM

## 2023-10-30 ENCOUNTER — Ambulatory Visit
Admission: RE | Admit: 2023-10-30 | Discharge: 2023-10-30 | Disposition: A | Discharge status: Home / Self Care | Source: Ambulatory Visit | Attending: Obstetrics and Gynecology | Admitting: Obstetrics and Gynecology

## 2023-10-30 DIAGNOSIS — N6320 Unspecified lump in the left breast, unspecified quadrant: Secondary | ICD-10-CM

## 2023-10-30 HISTORY — PX: BREAST BIOPSY: SHX20

## 2023-10-31 ENCOUNTER — Ambulatory Visit: Payer: Self-pay | Admitting: Neurology

## 2023-11-02 LAB — SURGICAL PATHOLOGY

## 2023-11-09 NOTE — Progress Notes (Unsigned)
 [facility] initial screening   Interpreter- Bernice Angry, [place]   Clinical Measurement:  Vitals:   11/10/23 0900 11/10/23 0920  BP: 125/80 124/75   Fasting Labs Drawn Today, will review with patient when they result.   Medical History: Patient states that she does not know if she has high cholesterol, does not have high blood pressure and she does not have diabetes. Patient states that she does not have history of gestational hypertension, does not have history of pre-eclampsia/eclampsia and she does not have history of gestational diabetes.    Medications: Patient states that she does not take medication to lower cholesterol, blood pressure or blood sugar.  Patient does not take an aspirin  a day to help prevent a heart attack or stroke.    Blood pressure, self measurement: Patient states that she does not measure blood pressure from home. She checks her blood pressure N/A. She shares her readings with a health care provider: N/A.   Nutrition: Patient states that on average she eats 3 cups of fruit and 1 cups of vegetables per day. Patient states that she does not eat fish at least 2 times per week. Patient eats about half servings of whole grains. Patient drinks less than 36 ounces of beverages with added sugar weekly: yes. Patient is currently watching sodium or salt intake: yes. In the past 7 days patient has consumed drinks containing alcohol on 0 days. On a day that patient consumes drinks containing alcohol on average 0 drinks are consumed.      Physical activity: Patient states that she gets 60 minutes of physical activity each week.  Smoking status: Patient states that she has has never smoked .   Quality of life: Over the past 2 weeks patient states that she had little interest or pleasure in doing things: not at all. She has been feeling down, depressed or hopeless:not at all.   Social Determinants of Health Assessment:   Computer Use: During the last 12 months patient states  that she has used any of the following: desktop/laptop, smart phone or tablet/other portable wireless computer: yes.   Internet Use: During the last 12 months, did you or any member of your household have access to the internet: Yes, by paying a cell phone company or internet service provider.   Food Insecurities: During the last 12 months, where there any times when you were worried that you would run out of food because of a lack of money or other resources: No.   Transportation Barriers: During the last 12 months, have you missed a doctor's appointment because of transportation problems: No.   Childcare Barriers: If you are currently using childcare services, please identify  the type of services you use. (If not using childcare services, please select Not applicable): not applicable. During the last 12 months, have you had any barriers to childcare services such as: not applicable.   Housing: What is your housing situation today: I have housing.   Intimate Partner Violence: During the last 12 months, how often did your partner physically hurt you: never. During the last 12 months, how often did your partner insult you or talk down to you: never.  Medication Adherence: During the last 12 months, did you ever forget to take your medicine: not applicable. During the last 12 months, were you careless ar times about taking your medicine: not applicable. During the last 12 months, when you felt better did you sometimes stop taking your medication: not applicable. During the last 12 months,  sometimes if you felt worse when you took your medicine did you stop taking it: not applicable.   Risk reduction and counseling:   Health Coaching: Patient consumes a good amount of fruit (3-4 servings) daily. Patient consumes at least a servings of vegetables on most days. Spoke with patient about the daily recommendation for vegetable intake and showed her what a serving would look like. Patient usually  consumes fish once a week. I believe that she fries the fish most of the time. Spoke with patient about heart healthy ways that she can prepare fish such as: grilling, baking, sauteeing in a pan with a small amount of olive oil or by using an air fryer. Patient consumes whole wheat bread and oatmeal regularly. Patient consumes 2-3 cups of soda on the weekends. Encouraged her to try and keep her sweetened beverage intake to below 36 oz weekly. Patient tries to walk 2-3 days a week for 30-40 minutes.  Goal: Patient will increase vegetable intake to 2 servings daily. Patient will work on reaching this goal over the next month.   Navigation:  I will notify patient of lab results. Patient is aware of 2 more health coaching sessions and a follow up.

## 2023-11-10 ENCOUNTER — Inpatient Hospital Stay: Payer: Self-pay | Attending: Obstetrics and Gynecology | Admitting: *Deleted

## 2023-11-10 ENCOUNTER — Other Ambulatory Visit: Payer: Self-pay

## 2023-11-10 VITALS — BP 124/75 | Ht 64.0 in | Wt 164.7 lb

## 2023-11-10 DIAGNOSIS — Z0000 Encounter for general adult medical examination without abnormal findings: Secondary | ICD-10-CM

## 2023-11-11 LAB — LIPID PANEL
Chol/HDL Ratio: 4.7 ratio — ABNORMAL HIGH (ref 0.0–4.4)
Cholesterol, Total: 205 mg/dL — ABNORMAL HIGH (ref 100–199)
HDL: 44 mg/dL (ref >39)
LDL Chol Calc (NIH): 129 mg/dL — ABNORMAL HIGH (ref 0–99)
Triglycerides: 177 mg/dL — ABNORMAL HIGH (ref 0–149)
VLDL Cholesterol Cal: 32 mg/dL (ref 5–40)

## 2023-11-11 LAB — HEMOGLOBIN A1C
Est. average glucose Bld gHb Est-mCnc: 123 mg/dL
Hgb A1c MFr Bld: 5.9 % — ABNORMAL HIGH (ref 4.8–5.6)

## 2023-11-11 LAB — GLUCOSE, RANDOM: Glucose: 84 mg/dL (ref 70–99)

## 2023-11-21 ENCOUNTER — Ambulatory Visit: Payer: Self-pay

## 2023-11-21 NOTE — Telephone Encounter (Signed)
 Health coaching 2   interpreter- [employer] [phone]   Labs- 205 cholesterol, 129 LDL cholesterol, 177 triglycerides, 44 HDL cholesterol, 5.9 hemoglobin A1C, 84 mean plasma glucose. Patient understands and is aware of her lab results.   Goals-  1. Reduce the amount of fried and fatty foods consumed. Try to grill, bake, broil or sautee foods instead. 2. Reduce the amount of red meats consumed. Try to substitute for leaner proteins like chicken or fish. 3. Daily exercise for 20-30 minutes. 4. Watch the amount of sweet and sugary foods and drinks consumed. 5. Watch the amount of carb rich foods consumed.   Navigation:  Patient is aware of 1 more health coaching sessions and a follow up. Patient is scheduled for FU with [facility] on December 08, 2023 @ 10:50 am.

## 2023-11-23 ENCOUNTER — Other Ambulatory Visit: Payer: Self-pay

## 2023-11-27 ENCOUNTER — Other Ambulatory Visit: Payer: Self-pay

## 2023-12-08 ENCOUNTER — Ambulatory Visit: Payer: Self-pay | Attending: Nurse Practitioner | Admitting: Nurse Practitioner

## 2023-12-08 ENCOUNTER — Encounter: Payer: Self-pay | Admitting: Nurse Practitioner

## 2023-12-08 ENCOUNTER — Other Ambulatory Visit: Payer: Self-pay

## 2023-12-08 VITALS — BP 122/80 | HR 98 | Ht 64.0 in | Wt 168.0 lb

## 2023-12-08 DIAGNOSIS — R7303 Prediabetes: Secondary | ICD-10-CM

## 2023-12-08 DIAGNOSIS — H04123 Dry eye syndrome of bilateral lacrimal glands: Secondary | ICD-10-CM

## 2023-12-08 DIAGNOSIS — F32A Depression, unspecified: Secondary | ICD-10-CM

## 2023-12-08 DIAGNOSIS — Z79899 Other long term (current) drug therapy: Secondary | ICD-10-CM

## 2023-12-08 DIAGNOSIS — F419 Anxiety disorder, unspecified: Secondary | ICD-10-CM

## 2023-12-08 DIAGNOSIS — E7800 Pure hypercholesterolemia, unspecified: Secondary | ICD-10-CM

## 2023-12-08 MED ORDER — CARBOXYMETHYLCELLULOSE SODIUM 1 % OP SOLN
1.0000 [drp] | Freq: Two times a day (BID) | OPHTHALMIC | 12 refills | Status: AC
  Filled 2023-12-08: qty 30, fill #0

## 2023-12-08 MED ORDER — HYDROXYZINE HCL 25 MG PO TABS
12.5000 mg | ORAL_TABLET | Freq: Three times a day (TID) | ORAL | 2 refills | Status: AC | PRN
  Filled 2023-12-08: qty 60, 20d supply, fill #0
  Filled 2024-01-31: qty 60, 20d supply, fill #1

## 2023-12-08 MED ORDER — BUPROPION HCL ER (XL) 150 MG PO TB24
150.0000 mg | ORAL_TABLET | Freq: Every day | ORAL | 1 refills | Status: AC
  Filled 2023-12-08: qty 90, 90d supply, fill #0

## 2023-12-08 NOTE — Progress Notes (Signed)
 Assessment & Plan:  Tracie [name] was seen today for medical management of chronic issues.  Diagnoses and all orders for this visit:  Hypercholesterolemia Cholesterol elevated but not requiring statin at this time - Provided dietary recommendations to manage cholesterol.   Dry eyes -     carboxymethylcellulose 1 % ophthalmic solution; Apply 1 drop to eye 2 (two) times daily. Ocular pruritus and intermittent cloudy vision Symptoms suggest dry eyes or irritation. - Prescribed allergy eye drops. - Recommended eye exam if symptoms persist, especially with vision changes or floaters. - Advised optometrist visit for comprehensive eye exam. Follow-up   Prediabetes Follow-up 6 months - Advised dietary changes for cholesterol and prediabetes management. - Encouraged regular physical activity, such as walking, low-carb low-cholesterol diet.  Anxiety and depression Denies any mood instability -     buPROPion  (WELLBUTRIN  XL) 150 MG 24 hr tablet; Take 1 tablet (150 mg total) by mouth daily. For Depression -     hydrOXYzine  (ATARAX ) 25 MG tablet; Take 1/2 to 1 tablet by mouth every 8 hours as needed for anxiety   Patient has been counseled on age-appropriate routine health concerns for screening and prevention. These are reviewed and up-to-date. Referrals have been placed accordingly. Immunizations are up-to-date or declined.    Subjective:   Chief Complaint  Patient presents with   Medical Management of Chronic Issues    Eyes itching   History of Present Illness Tracie [name] is a 43 year old female who presents with elevated cholesterol and prediabetes after being [facility] by [facility]  Lab Results  Component Value Date   CHOL 205 (H) 11/10/2023   CHOL 204 (H) 06/13/2017   CHOL 192 02/17/2016   Lab Results  Component Value Date   HDL 44 11/10/2023   HDL 42 06/13/2017   HDL 42 (L) 02/17/2016   Lab Results  Component Value Date   LDLCALC 129 (H) 11/10/2023    LDLCALC 121 (H) 06/13/2017   LDLCALC 91 02/17/2016   Lab Results  Component Value Date   TRIG 177 (H) 11/10/2023   TRIG 204 (H) 06/13/2017   TRIG 297 (H) 02/17/2016   Lab Results  Component Value Date   CHOLHDL 4.7 (H) 11/10/2023   CHOLHDL 4.9 (H) 06/13/2017   CHOLHDL 4.6 02/17/2016   The 10-year ASCVD risk score (Arnett DK, et al., 2019) is: 0.9%   Values used to calculate the score:     Age: 60 years     Clincally relevant sex: Female     Is Non-Hispanic African American: No     Diabetic: No     Tobacco smoker: No     Systolic Blood Pressure: 122 mmHg     Is BP treated: No     HDL Cholesterol: 44 mg/dL     Total Cholesterol: 205 mg/dL    Lab Results  Component Value Date   HGBA1C 5.9 (H) 11/10/2023     She experiences itching inside her eyes, describing it as feeling like 'rocks' inside. This sensation occurs sometimes while driving and has happened several times. She also reports having to blink frequently due to cloudy vision or 'small clouds' appearing in her vision, which disappear after blinking or moving her eyes.   Review of Systems  Constitutional:  Negative for fever, malaise/fatigue and weight loss.  HENT: Negative.  Negative for nosebleeds.   Eyes:  Positive for blurred vision and pain. Negative for double vision and photophobia.  Respiratory: Negative.  Negative for cough and  shortness of breath.   Cardiovascular: Negative.  Negative for chest pain, palpitations and leg swelling.  Gastrointestinal: Negative.  Negative for heartburn, nausea and vomiting.  Musculoskeletal: Negative.  Negative for myalgias.  Neurological: Negative.  Negative for dizziness, focal weakness, seizures and headaches.  Psychiatric/Behavioral: Negative.  Negative for suicidal ideas.     Past Medical History:  Diagnosis Date   Anxiety    Depression    Headache     Past Surgical History:  Procedure Laterality Date   BREAST BIOPSY Right 06/30/2022   US  RT BREAST BX W LOC DEV  EA ADD LESION IMG BX SPEC US  GUIDE 06/30/2022 [facility]   BREAST BIOPSY Right 06/30/2022   US  RT BREAST BX W LOC DEV EA ADD LESION IMG BX SPEC US  GUIDE 06/30/2022 [facility]   BREAST BIOPSY Right 06/30/2022   US  RT BREAST BX W LOC DEV 1ST LESION IMG BX SPEC US  GUIDE 06/30/2022 [facility]   BREAST BIOPSY  10/13/2022   US  RT RADIOACTIVE SEED LOC 10/13/2022 [facility]   BREAST BIOPSY Right 10/13/2022   US  RT RADIOACTIVE SEED EA ADD LESION 10/13/2022 [facility]   BREAST BIOPSY Left 10/30/2023   US  LT BREAST BX W LOC DEV 1ST LESION IMG BX SPEC US  GUIDE 10/30/2023 [facility]   BREAST BIOPSY Left 10/30/2023   US  LT BREAST BX W LOC DEV EA ADD LESION IMG BX SPEC US  GUIDE 10/30/2023 [facility]   BREAST LUMPECTOMY WITH RADIOACTIVE SEED LOCALIZATION Right 10/14/2022   Procedure: RIGHT BREAST LUMPECTOMY WITH RADIOACTIVE SEED LOCALIZATION X2;  Surgeon: Curvin Deward MOULD, MD;  Location: [facility];  Service: General;  Laterality: Right;    Family History  Problem Relation Age of Onset   Breast cancer Paternal Aunt    Breast cancer Paternal Aunt    Breast cancer Paternal Aunt    Hypertension Neg Hx    Colon cancer Neg Hx    Esophageal cancer Neg Hx    Pancreatic cancer Neg Hx    Stomach cancer Neg Hx    Liver disease Neg Hx     Social History Reviewed with no changes to be made today.   Outpatient Medications Prior to Visit  Medication Sig Dispense Refill   amitriptyline  (ELAVIL ) 25 MG tablet Take 1 tablet (25 mg total) by mouth at bedtime. 30 tablet 5   rizatriptan  (MAXALT ) 10 MG tablet Take 1 tablet (10 mg total) by mouth as needed for migraine. May repeat in 2 hours if needed.  Maximum 2 tablets in 24 hours. 10 tablet 5   buPROPion  (WELLBUTRIN  XL) 150 MG 24 hr tablet Take 1 tablet (150 mg total) by mouth daily. For Depression 90 tablet 1   hydrOXYzine  (ATARAX ) 25 MG tablet Take 1/2 to 1 tablet by mouth every 8 hours as needed for  anxiety 60 tablet 2   methocarbamol  (ROBAXIN ) 500 MG tablet Take 500 mg by mouth 4 (four) times daily. (Patient not taking: Reported on 12/08/2023)     mupirocin  ointment (BACTROBAN ) 2 % Apply 1 Application topically 2 (two) times daily. Apply to scalp as instructed (Patient not taking: Reported on 12/08/2023) 22 g 1   No facility-administered medications prior to visit.    No Known Allergies     Objective:    BP 122/80   Pulse 98   Ht 5' 4 (1.626 m)   Wt 168 lb (76.2 kg)   SpO2 100%   BMI 28.84 kg/m  Wt Readings from Last 3  Encounters:  12/08/23 168 lb (76.2 kg)  11/10/23 164 lb 11.2 oz (74.7 kg)  10/26/23 167 lb (75.8 kg)    Physical Exam Vitals and nursing note reviewed.  Constitutional:      Appearance: She is well-developed.  HENT:     Head: Normocephalic and atraumatic.  Cardiovascular:     Rate and Rhythm: Normal rate and regular rhythm.     Heart sounds: Normal heart sounds. No murmur heard.    No friction rub. No gallop.  Pulmonary:     Effort: Pulmonary effort is normal. No tachypnea or respiratory distress.     Breath sounds: Normal breath sounds. No decreased breath sounds, wheezing, rhonchi or rales.  Chest:     Chest wall: No tenderness.  Musculoskeletal:        General: Normal range of motion.     Cervical back: Normal range of motion.  Skin:    General: Skin is warm and dry.  Neurological:     Mental Status: She is alert and oriented to person, place, and time.     Coordination: Coordination normal.  Psychiatric:        Behavior: Behavior normal. Behavior is cooperative.        Thought Content: Thought content normal.        Judgment: Judgment normal.          Patient has been counseled extensively about nutrition and exercise as well as the importance of adherence with medications and regular follow-up. The patient was given clear instructions to go to ER or return to medical center if symptoms don't improve, worsen or new problems develop.  The patient verbalized understanding.   Follow-up: Return in about 5 months (around 05/09/2024).   Haze LELON Servant, FNP-BC [facility] [place], [place] [phone]   12/08/2023, 2:47 PM

## 2023-12-15 ENCOUNTER — Other Ambulatory Visit: Payer: Self-pay

## 2024-01-31 ENCOUNTER — Other Ambulatory Visit: Payer: Self-pay

## 2024-02-08 ENCOUNTER — Encounter: Payer: Self-pay | Admitting: Neurology

## 2024-02-09 ENCOUNTER — Inpatient Hospital Stay: Admitting: Genetic Counselor

## 2024-02-09 ENCOUNTER — Inpatient Hospital Stay

## 2024-02-13 ENCOUNTER — Encounter: Admitting: Genetic Counselor

## 2024-02-13 ENCOUNTER — Other Ambulatory Visit

## 2024-02-17 ENCOUNTER — Ambulatory Visit: Payer: Self-pay

## 2024-02-21 ENCOUNTER — Other Ambulatory Visit: Payer: Self-pay

## 2024-04-25 ENCOUNTER — Inpatient Hospital Stay: Payer: Self-pay

## 2024-04-25 ENCOUNTER — Inpatient Hospital Stay: Payer: Self-pay | Admitting: Genetic Counselor

## 2024-04-25 ENCOUNTER — Encounter: Payer: Self-pay | Admitting: Genetic Counselor

## 2024-04-25 ENCOUNTER — Other Ambulatory Visit: Payer: Self-pay

## 2024-04-25 DIAGNOSIS — Z803 Family history of malignant neoplasm of breast: Secondary | ICD-10-CM | POA: Insufficient documentation

## 2024-04-25 DIAGNOSIS — Z8049 Family history of malignant neoplasm of other genital organs: Secondary | ICD-10-CM | POA: Insufficient documentation

## 2024-04-25 NOTE — Progress Notes (Signed)
 REFERRING PROVIDER: Theotis Haze ORN, NP [address] [place] [address] [place],  [place] [address]  PRIMARY PROVIDER:  Theotis Haze ORN, NP  PRIMARY REASON FOR VISIT:  1. Family history of breast cancer   2. Family history of uterine cancer      HISTORY OF PRESENT ILLNESS:   Ms. Bucklin, a 44 y.o. female, was seen for a [facility] cancer genetics consultation at the request of Dr. Theotis due to a family history of breast cancer.  Ms. Scrivens presents to clinic today to discuss the possibility of a hereditary predisposition to cancer, genetic testing, and to further clarify her future cancer risks, as well as potential cancer risks for family members.   Ms. Monteith is a 44 y.o. female with no personal history of cancer.  She has had two breast biopsies which were normal.  CANCER HISTORY:  Oncology History   No problem history exists.     RISK FACTORS:  Menarche was at age 53.  First live birth at age 65.  OCP use for approximately 0 years.  Ovaries intact: yes.  Hysterectomy: no.  Menopausal status: premenopausal.  HRT use: 0 years. Colonoscopy: yes; normal. Mammogram within the last year: yes. Number of breast biopsies: 2. Up to date with pelvic exams: yes. Any excessive radiation exposure in the past: no  Past Medical History:  Diagnosis Date   Anxiety    Depression    Family history of breast cancer    Family history of uterine cancer    Headache     Past Surgical History:  Procedure Laterality Date   BREAST BIOPSY Right 06/30/2022   US  RT BREAST BX W LOC DEV EA ADD LESION IMG BX SPEC US  GUIDE 06/30/2022 [facility]   BREAST BIOPSY Right 06/30/2022   US  RT BREAST BX W LOC DEV EA ADD LESION IMG BX SPEC US  GUIDE 06/30/2022 [facility]   BREAST BIOPSY Right 06/30/2022   US  RT BREAST BX W LOC DEV 1ST LESION IMG BX SPEC US  GUIDE 06/30/2022 [facility]   BREAST BIOPSY  10/13/2022   US  RT RADIOACTIVE SEED LOC 10/13/2022 [facility]   BREAST BIOPSY Right 10/13/2022   US  RT RADIOACTIVE SEED EA ADD LESION 10/13/2022 [facility]   BREAST BIOPSY Left 10/30/2023   US  LT BREAST BX W LOC DEV 1ST LESION IMG BX SPEC US  GUIDE 10/30/2023 [facility]   BREAST BIOPSY Left 10/30/2023   US  LT BREAST BX W LOC DEV EA ADD LESION IMG BX SPEC US  GUIDE 10/30/2023 [facility]   BREAST LUMPECTOMY WITH RADIOACTIVE SEED LOCALIZATION Right 10/14/2022   Procedure: RIGHT BREAST LUMPECTOMY WITH RADIOACTIVE SEED LOCALIZATION X2;  Surgeon: Curvin Deward MOULD, MD;  Location: [facility];  Service: General;  Laterality: Right;    Social History   Socioeconomic History   Marital status: Single    Spouse name: Not on file   Number of children: 2   Years of education: Not on file   Highest education level: Not on file  Occupational History   Not on file  Tobacco Use   Smoking status: Never   Smokeless tobacco: Never  Vaping Use   Vaping status: Never Used  Substance and Sexual Activity   Alcohol use: No   Drug use: No   Sexual activity: Yes    Birth control/protection: Condom  Other Topics Concern   Not on file  Social History Narrative   Right handed   Drinks caffeine prn   Two  children, mother and husband   One story home   No falls   Social Drivers of Health   Tobacco Use: Low Risk (12/08/2023)   Patient History    Smoking Tobacco Use: Never    Smokeless Tobacco Use: Never    Passive Exposure: Not on file  Financial Resource Strain: Not on file  Food Insecurity: No Food Insecurity (10/26/2023)   Epic    Worried About Programme Researcher, Broadcasting/film/video in the Last Year: Never true    Ran Out of Food in the Last Year: Never true  Transportation Needs: No Transportation Needs (10/26/2023)   Epic    Lack of Transportation (Medical): No    Lack of Transportation (Non-Medical): No  Physical Activity: Not on file  Stress: Not on file  Social Connections: Not on file  Depression (EYV7-0): Medium Risk (07/21/2023)    Depression (PHQ2-9)    PHQ-2 Score: 6  Alcohol Screen: Not on file  Housing: Not on file  Utilities: Not on file  Health Literacy: Not on file     FAMILY HISTORY:  We obtained a detailed, 4-generation family history.  Significant diagnoses are listed below: Family History  Problem Relation Age of Onset   Uterine cancer Maternal Aunt    Breast cancer Paternal Aunt    Breast cancer Paternal Aunt    Breast cancer Paternal Aunt    Throat cancer Paternal Uncle    Cancer Maternal Grandfather        NOS   Cancer Paternal Grandmother        NOS   Throat cancer Paternal Grandfather    Hypertension Neg Hx    Colon cancer Neg Hx    Esophageal cancer Neg Hx    Pancreatic cancer Neg Hx    Stomach cancer Neg Hx    Liver disease Neg Hx      Significant findings in the family history reported include: Three paternal aunts with breast cancer Paternal uncle and grandfather with throat cancer Paternal grandmother with unknown cancer Maternal aunt with uterine cancer Maternal grandfather with unknown cancer  Ms. Orban is unaware of previous family history of genetic testing for hereditary cancer risks. There is no reported Ashkenazi Jewish ancestry. There is no known consanguinity.  GENETIC COUNSELING ASSESSMENT: Ms. Livingston is a 44 y.o. female with a personal and family history of cancer which is somewhat suggestive of a hereditary cancer syndrome and predisposition to cancer given the three individuals with breast cancer. We, therefore, discussed and recommended the following at today's visit.   DISCUSSION: We discussed that, in general, most cancer is not inherited in families, but instead is sporadic or familial. Sporadic cancers occur by chance and typically happen at older ages (>50 years) as this type of cancer is caused by genetic changes acquired during an individuals lifetime. Some families have more cancers than would be expected by chance; however, the ages or  types of cancer are not consistent with a known genetic mutation or known genetic mutations have been ruled out. This type of familial cancer is thought to be due to a combination of multiple genetic, environmental, hormonal, and lifestyle factors. While this combination of factors likely increases the risk of cancer, the exact source of this risk is not currently identifiable or testable.  We discussed that 5 - 10% of breast cancer is hereditary, with most cases associated with BRCA mutations.  There are other genes that can be associated with hereditary breast cancer syndromes.  These include ATM, CHEK2 and  PALB2.  We discussed that testing is beneficial for several reasons including knowing how to follow individuals after completing their treatment, identifying whether potential treatment options such as PARP inhibitors would be beneficial, and understand if other family members could be at risk for cancer and allow them to undergo genetic testing.   We reviewed the characteristics, features and inheritance patterns of hereditary cancer syndromes. We also discussed genetic testing, including the appropriate family members to test, the process of testing, insurance coverage and turn-around-time for results. We discussed the implications of a negative, positive, carrier and/or variant of uncertain significant result. Ms. Justman  was offered a common hereditary cancer panel (36+ genes) and an expanded pan-cancer panel (70+ genes). Ms. Bambach was informed of the benefits and limitations of each panel, including that expanded pan-cancer panels contain genes that do not have clear management guidelines at this point in time.  We also discussed that as the number of genes included on a panel increases, the chances of variants of uncertain significance increases. Ms. Buley decided to pursue genetic testing for the CancerNext-Expanded+RNAinsight gene panel.   The CancerNext-Expanded gene  panel offered by [facility] and includes sequencing, rearrangement, and RNA analysis for the following 77 genes: AIP, ALK, APC, ATM, BAP1, BARD1, BMPR1A, BRCA1, BRCA2, BRIP1, CDC73, CDH1, CDK4, CDKN1B, CDKN2A, CEBPA, CHEK2, CTNNA1, DDX41, DICER1, ETV6, FH, FLCN, GATA2, LZTR1, MAX, MBD4, MEN1, MET, MLH1, MSH2, MSH3, MSH6, MUTYH, NF1, NF2, NTHL1, PALB2, PHOX2B, PMS2, POT1, PRKAR1A, PTCH1, PTEN, RAD51C, RAD51D, RB1, RET, RPS20, RUNX1, SDHA, SDHAF2, SDHB, SDHC, SDHD, SMAD4, SMARCA4, SMARCB1, SMARCE1, STK11, SUFU, TMEM127, TP53, TSC1, TSC2, VHL, and WT1 (sequencing and deletion/duplication); AXIN2, CTNNA1, DDX41, EGFR, HOXB13, KIT, MBD4, MITF, MSH3, PDGFRA, POLD1 and POLE (sequencing only); EPCAM and GREM1 (deletion/duplication only). RNA data is routinely analyzed for use in variant interpretation for all genes.   Based on [name]'s family history of cancer, she meets medical criteria for genetic testing. Though Ms. Tartt is not personally affected, there are no affected family members that are willing/able/available to undergo hereditary cancer testing.  Therefore, Ms. Gates the most informative family member available.  Despite that she meets criteria, she may still have an out of pocket cost. We discussed that if her out of pocket cost for testing is over $100, the laboratory will call and confirm whether she wants to proceed with testing.  If the out of pocket cost of testing is less than $100 she will be billed by the genetic testing laboratory.   We discussed that some people do not want to undergo genetic testing due to fear of genetic discrimination.  The Genetic Information Nondiscrimination Act (GINA) was signed into federal law in 2008. GINA prohibits health insurers and most employers from discriminating against individuals based on genetic information (including the results of genetic tests and family history information). According to GINA, health insurance  companies cannot consider genetic information to be a preexisting condition, nor can they use it to make decisions regarding coverage or rates. GINA also makes it illegal for most employers to use genetic information in making decisions about hiring, firing, promotion, or terms of employment. It is important to note that GINA does not offer protections for life insurance, disability insurance, or long-term care insurance. GINA does not apply to those in the [employer], those who work for companies with less than 15 employees, and new life insurance or long-term disability insurance policies.  Health status due to a cancer diagnosis is not protected under GINA. More information  about GINA can be found by visiting eliteclients.be.  PLAN:  After considering the risks, benefits, and limitations, Ms. Schroyer provided informed consent to pursue genetic testing.  Blood draw is scheduled for 05/03/2024 and the blood sample will be sent to [facility] for analysis of the CancerNext-Expanded+RNAinsight. Results should be available within approximately 2-3 weeks' time, at which point they will be disclosed by telephone to [name], as will any additional recommendations warranted by these results. Ms. Quebedeaux will receive a summary of her genetic counseling visit and a copy of her results once available. This information will also be available in Epic.  Ms. Genter applied for the FAP and should have $0 OOP.  Lastly, we encouraged [name] to remain in contact with cancer genetics annually so that we can continuously update the family history and inform her of any changes in cancer genetics and testing that may be of benefit for this family.   Ms. Morrill questions were answered to her satisfaction today. Our contact information was provided should additional questions or concerns arise. Thank you for the referral and allowing us  to share in the care  of your patient.   Quashawn Jewkes P. Perri, MS, CGC Licensed, Patent Attorney [email or web address] phone: [phone]  I personally spent a total of 45 minutes in the care of the patient today including preparing to see the patient, getting/reviewing separately obtained history, counseling and educating, placing orders, referring and communicating with other health care professionals, and documenting clinical information in the EHR. The patient was seen alone, but did have an interpreter.  Drs. Lanny Stalls, and/or Gudena were available for questions, if needed..    _______________________________________________________________________ For Office Staff:  Number of people involved in session: 1 Was an Intern/ student involved with case: no

## 2024-05-03 ENCOUNTER — Inpatient Hospital Stay: Payer: Self-pay

## 2024-05-10 ENCOUNTER — Ambulatory Visit: Admitting: Nurse Practitioner

## 2024-06-12 ENCOUNTER — Ambulatory Visit: Payer: Self-pay | Admitting: Neurology

## 2024-06-19 ENCOUNTER — Ambulatory Visit: Payer: Self-pay | Admitting: Neurology
